# Patient Record
Sex: Male | Born: 1960
Health system: Southern US, Community
[De-identification: ages and names within clinical notes are randomized; demographics above are authoritative.]

## PROBLEM LIST (undated history)

## (undated) DIAGNOSIS — E11610 Type 2 diabetes mellitus with diabetic neuropathic arthropathy: Secondary | ICD-10-CM

## (undated) DIAGNOSIS — E119 Type 2 diabetes mellitus without complications: Secondary | ICD-10-CM

## (undated) DIAGNOSIS — K219 Gastro-esophageal reflux disease without esophagitis: Secondary | ICD-10-CM

## (undated) DIAGNOSIS — E13319 Other specified diabetes mellitus with unspecified diabetic retinopathy without macular edema: Secondary | ICD-10-CM

## (undated) DIAGNOSIS — J189 Pneumonia, unspecified organism: Secondary | ICD-10-CM

## (undated) DIAGNOSIS — R600 Localized edema: Secondary | ICD-10-CM

## (undated) DIAGNOSIS — N486 Induration penis plastica: Secondary | ICD-10-CM

## (undated) DIAGNOSIS — E559 Vitamin D deficiency, unspecified: Secondary | ICD-10-CM

## (undated) DIAGNOSIS — N400 Enlarged prostate without lower urinary tract symptoms: Secondary | ICD-10-CM

## (undated) DIAGNOSIS — Z974 Presence of external hearing-aid: Secondary | ICD-10-CM

## (undated) DIAGNOSIS — D649 Anemia, unspecified: Secondary | ICD-10-CM

## (undated) DIAGNOSIS — K900 Celiac disease: Secondary | ICD-10-CM

## (undated) DIAGNOSIS — Z860101 Personal history of adenomatous and serrated colon polyps: Secondary | ICD-10-CM

## (undated) DIAGNOSIS — N35010 Post-traumatic urethral stricture, male, meatal: Secondary | ICD-10-CM

## (undated) DIAGNOSIS — E785 Hyperlipidemia, unspecified: Secondary | ICD-10-CM

## (undated) DIAGNOSIS — G629 Polyneuropathy, unspecified: Secondary | ICD-10-CM

## (undated) DIAGNOSIS — K5909 Other constipation: Secondary | ICD-10-CM

## (undated) DIAGNOSIS — D631 Anemia in chronic kidney disease: Secondary | ICD-10-CM

## (undated) DIAGNOSIS — Z9359 Other cystostomy status: Secondary | ICD-10-CM

## (undated) DIAGNOSIS — N183 Chronic kidney disease, stage 3 unspecified: Secondary | ICD-10-CM

## (undated) DIAGNOSIS — E782 Mixed hyperlipidemia: Secondary | ICD-10-CM

## (undated) DIAGNOSIS — N189 Chronic kidney disease, unspecified: Secondary | ICD-10-CM

## (undated) DIAGNOSIS — I10 Essential (primary) hypertension: Secondary | ICD-10-CM

## (undated) DIAGNOSIS — Z86010 Personal history of colonic polyps: Secondary | ICD-10-CM

## (undated) DIAGNOSIS — N401 Enlarged prostate with lower urinary tract symptoms: Secondary | ICD-10-CM

## (undated) DIAGNOSIS — M47814 Spondylosis without myelopathy or radiculopathy, thoracic region: Secondary | ICD-10-CM

## (undated) DIAGNOSIS — G4730 Sleep apnea, unspecified: Secondary | ICD-10-CM

## (undated) DIAGNOSIS — K5730 Diverticulosis of large intestine without perforation or abscess without bleeding: Secondary | ICD-10-CM

## (undated) HISTORY — DX: Benign prostatic hyperplasia without lower urinary tract symptoms: N40.0

## (undated) HISTORY — DX: Anemia, unspecified: D64.9

## (undated) HISTORY — DX: Gastro-esophageal reflux disease without esophagitis: K21.9

## (undated) HISTORY — DX: Hyperlipidemia, unspecified: E78.5

## (undated) HISTORY — DX: Spondylosis without myelopathy or radiculopathy, thoracic region: M47.814

## (undated) HISTORY — DX: Type 2 diabetes mellitus without complications: E11.9

## (undated) HISTORY — DX: Essential (primary) hypertension: I10

---

## 2001-11-15 HISTORY — PX: COLONOSCOPY: SHX174

## 2001-12-06 ENCOUNTER — Ambulatory Visit (HOSPITAL_COMMUNITY): Admission: RE | Admit: 2001-12-06 | Discharge: 2001-12-06 | Payer: Self-pay | Admitting: Internal Medicine

## 2001-12-13 ENCOUNTER — Encounter (INDEPENDENT_AMBULATORY_CARE_PROVIDER_SITE_OTHER): Payer: Self-pay | Admitting: Internal Medicine

## 2001-12-13 ENCOUNTER — Ambulatory Visit (HOSPITAL_COMMUNITY): Admission: RE | Admit: 2001-12-13 | Discharge: 2001-12-13 | Payer: Self-pay | Admitting: Internal Medicine

## 2001-12-15 ENCOUNTER — Ambulatory Visit (HOSPITAL_COMMUNITY): Admission: RE | Admit: 2001-12-15 | Discharge: 2001-12-15 | Payer: Self-pay | Admitting: Internal Medicine

## 2003-09-20 ENCOUNTER — Ambulatory Visit: Admission: RE | Admit: 2003-09-20 | Discharge: 2003-09-20 | Payer: Self-pay | Admitting: Family Medicine

## 2004-02-28 ENCOUNTER — Ambulatory Visit (HOSPITAL_COMMUNITY): Admission: RE | Admit: 2004-02-28 | Discharge: 2004-02-28 | Payer: Self-pay | Admitting: Family Medicine

## 2004-03-05 ENCOUNTER — Encounter (HOSPITAL_COMMUNITY): Admission: RE | Admit: 2004-03-05 | Discharge: 2004-04-04 | Payer: Self-pay | Admitting: Preventative Medicine

## 2013-11-15 DIAGNOSIS — K900 Celiac disease: Secondary | ICD-10-CM

## 2013-11-15 HISTORY — DX: Celiac disease: K90.0

## 2014-03-18 ENCOUNTER — Telehealth: Payer: Self-pay

## 2014-03-18 NOTE — Telephone Encounter (Signed)
PT was referred by Dr. Hilma Favors for screening colonoscopy. His hemoglobin is low at 11.7 and he needs an OV prior to scheduling colonoscopy.  I called and LMOM for a return call.

## 2014-03-19 ENCOUNTER — Telehealth: Payer: Self-pay

## 2014-03-19 NOTE — Telephone Encounter (Signed)
Patient was returning call to DS. DS asked for me to make patient OV due to his hemoglobin being 11.7 and needs OV prior to scheduling TCS. Pt is aware of OV on 6/5 at 0930 with [facility]SL.

## 2014-03-19 NOTE — Telephone Encounter (Signed)
Pt's referral info is in appt box.

## 2014-04-03 NOTE — Telephone Encounter (Signed)
Pt has appt with Neil Crouch, PA on 04/19/2014 at 9:30 Am for low hemoglobin and schedule colonoscopy.

## 2014-04-18 ENCOUNTER — Ambulatory Visit: Payer: Self-pay | Admitting: Gastroenterology

## 2014-04-19 ENCOUNTER — Ambulatory Visit (INDEPENDENT_AMBULATORY_CARE_PROVIDER_SITE_OTHER): Payer: 59 | Admitting: Gastroenterology

## 2014-04-19 ENCOUNTER — Encounter: Payer: Self-pay | Admitting: Gastroenterology

## 2014-04-19 ENCOUNTER — Encounter (INDEPENDENT_AMBULATORY_CARE_PROVIDER_SITE_OTHER): Payer: Self-pay

## 2014-04-19 VITALS — BP 111/70 | HR 52 | Temp 97.2°F | Ht 68.0 in | Wt 225.6 lb

## 2014-04-19 DIAGNOSIS — D509 Iron deficiency anemia, unspecified: Secondary | ICD-10-CM

## 2014-04-19 DIAGNOSIS — K219 Gastro-esophageal reflux disease without esophagitis: Secondary | ICD-10-CM

## 2014-04-19 MED ORDER — PEG 3350-KCL-NA BICARB-NACL 420 G PO SOLR: 4000.0000 mL | ORAL | Status: DC

## 2014-04-19 NOTE — Progress Notes (Signed)
Primary Care Physician:  Purvis Kilts, MD  Primary Gastroenterologist:  Barney Drain, MD   Chief Complaint  Patient presents with  . PT REFERRED FOR COLONOSCOPY/ HEMOGLOBIN LOW    HPI:  Brian Nielsen is a 53 y.o. male here for further evaluation of IDA at the request of Dr. Hilma Favors. GERD for more than 10 years. Chronically on medication. No prior EGD. No dysphagia, vomiting, abdominal pain, constipation, diarrhea, brbpr. No melena. DM for 10 years. Rare ibuprofen. 3 hemoccults negative per patient. Seen by Dr. Laural Golden for rectal bleeding in 2003. Per patient had couple of polyps. We have requested copy of report/path.  Current Outpatient Prescriptions  Medication Sig Dispense Refill  . amLODipine (NORVASC) 10 MG tablet Take 10 mg by mouth daily.      Marland Kitchen aspirin 325 MG EC tablet Take 325 mg by mouth daily.      . ferrous sulfate 325 (65 FE) MG tablet Take 325 mg by mouth 2 (two) times daily with a meal.      . glipiZIDE (GLUCOTROL) 10 MG tablet Take 10 mg by mouth 2 (two) times daily before a meal. PT takes it at night      . hydrochlorothiazide (HYDRODIURIL) 25 MG tablet Take 25 mg by mouth daily.      . insulin glargine (LANTUS) 100 UNIT/ML injection Inject into the skin at bedtime. 116 units at night      . lisinopril (PRINIVIL,ZESTRIL) 20 MG tablet Take 20 mg by mouth daily.      . metFORMIN (GLUCOPHAGE) 1000 MG tablet Take 1,000 mg by mouth 2 (two) times daily with a meal.      . metoprolol succinate (TOPROL-XL) 50 MG 24 hr tablet Take 50 mg by mouth daily. Take with or immediately following a meal.      . Omega-3 Fatty Acids (FISH OIL) 1000 MG CPDR Take by mouth. Pt takes 4 tablets daily      . omeprazole (PRILOSEC) 40 MG capsule Take 40 mg by mouth daily.      . rosuvastatin (CRESTOR) 20 MG tablet Take 20 mg by mouth daily.      . tamsulosin (FLOMAX) 0.4 MG CAPS capsule Take 0.4 mg by mouth.      . polyethylene glycol-electrolytes (TRILYTE) 420 G solution Take 4,000 mLs by mouth  as directed.  4000 mL  0   No current facility-administered medications for this visit.    Allergies as of 04/19/2014  . (No Known Allergies)    Past Medical History  Diagnosis Date  . HTN (hypertension)   . DM (diabetes mellitus)   . BPH (benign prostatic hyperplasia)   . Hyperlipidemia     Past Surgical History  Procedure Laterality Date  . Colonoscopy  2003    Dr. Laural Golden    Family History  Problem Relation Age of Onset  . Colon cancer Neg Hx   . Heart disease Father   . Aneurysm Mother     brain    History   Social History  . Marital Status: Single    Spouse Name: N/A    Number of Children: 1  . Years of Education: N/A   Occupational History  . [employer]    Social History Main Topics  . Smoking status: Former Research scientist (life sciences)  . Smokeless tobacco: Not on file     Comment: Quit x 30 years  . Alcohol Use: Yes     Comment: occasionally on weekends  . Drug Use: No  . Sexual  Activity: Not on file   Other Topics Concern  . Not on file   Social History Narrative  . No narrative on file      ROS:  General: Negative for anorexia, weight loss, fever, chills, fatigue, weakness. Eyes: Negative for vision changes.  ENT: Negative for hoarseness, difficulty swallowing , nasal congestion. CV: Negative for chest pain, angina, palpitations, dyspnea on exertion, peripheral edema.  Respiratory: Negative for dyspnea at rest, dyspnea on exertion, cough, sputum, wheezing.  GI: See history of present illness. GU:  Negative for dysuria, hematuria, urinary incontinence, urinary frequency, nocturnal urination.  MS: Negative for joint pain, low back pain.  Derm: Negative for rash or itching.  Neuro: Negative for weakness, abnormal sensation, seizure, frequent headaches, memory loss, confusion.  Psych: Negative for anxiety, depression, suicidal ideation, hallucinations.  Endo: Negative for unusual weight change.  Heme: Negative for bruising or bleeding. Allergy: Negative for rash  or hives.    Physical Examination:  BP 111/70  Pulse 52  Temp(Src) 97.2 F (36.2 C) (Oral)  Ht 5' 8"  (1.727 m)  Wt 225 lb 9.6 oz (102.331 kg)  BMI 34.31 kg/m2   General: Well-nourished, well-developed in no acute distress.  Head: Normocephalic, atraumatic.   Eyes: Conjunctiva pink, no icterus. Mouth: Oropharyngeal mucosa moist and pink , no lesions erythema or exudate. Neck: Supple without thyromegaly, masses, or lymphadenopathy.  Lungs: Clear to auscultation bilaterally.  Heart: Regular rate and rhythm, no murmurs rubs or gallops.  Abdomen: Bowel sounds are normal, nontender, nondistended, no hepatosplenomegaly or masses, no abdominal bruits or    hernia , no rebound or guarding.   Rectal: not performed Extremities: No lower extremity edema. No clubbing or deformities.  Neuro: Alert and oriented x 4 , grossly normal neurologically.  Skin: Warm and dry, no rash or jaundice.   Psych: Alert and cooperative, normal mood and affect.  Labs: Labs from 02/25/2014 Sodium 137, glucose 285, BUN 24, creatinine 1.07, total bilirubin 0.5, alkaline phosphatase 65, AST 21, ALT 30, albumin 4.2, calcium 9.3, white blood cell count 5200, hemoglobin 11.7, hematocrit 36, MCV 82, platelets 280,000, iron 41, TIBC 440, her saturations 9%, B12 473, folate greater than 20, ferritin 14  Imaging Studies: No results found.

## 2014-04-19 NOTE — Patient Instructions (Signed)
1. Colonoscopy and upper endoscopy in the near future. Please see separate instructions.

## 2014-04-19 NOTE — Assessment & Plan Note (Signed)
IDA. Patient reports heme negative take home test X 3. Last TCS 2003, ?polyps. Report has been requested. Void of any lower GI symptoms. He has chronic well-controlled GERD. No prior EGD. Recommend EGD/colonoscopy for further evaluation of IDA, rule out Barrett's.  I have discussed the risks, alternatives, benefits with regards to but not limited to the risk of reaction to medication, bleeding, infection, perforation and the patient is agreeable to proceed. Written consent to be obtained.

## 2014-04-19 NOTE — Progress Notes (Signed)
Please request copy of TCS and path, done by Dr. Laural Golden in 2003.

## 2014-04-22 ENCOUNTER — Encounter (HOSPITAL_COMMUNITY): Payer: Self-pay | Admitting: Pharmacy Technician

## 2014-04-22 NOTE — Progress Notes (Signed)
Requested Records from [place] at [facility] medical Records

## 2014-05-03 ENCOUNTER — Encounter (HOSPITAL_COMMUNITY): Payer: Self-pay | Admitting: *Deleted

## 2014-05-03 ENCOUNTER — Ambulatory Visit (HOSPITAL_COMMUNITY)
Admission: RE | Admit: 2014-05-03 | Discharge: 2014-05-03 | Disposition: A | Discharge status: Home / Self Care | Payer: 59 | Source: Ambulatory Visit | Attending: Gastroenterology | Admitting: Gastroenterology

## 2014-05-03 ENCOUNTER — Encounter (HOSPITAL_COMMUNITY)
Admission: RE | Disposition: A | Discharge status: Home / Self Care | Payer: Self-pay | Source: Ambulatory Visit | Attending: Gastroenterology

## 2014-05-03 DIAGNOSIS — Z87891 Personal history of nicotine dependence: Secondary | ICD-10-CM | POA: Insufficient documentation

## 2014-05-03 DIAGNOSIS — E119 Type 2 diabetes mellitus without complications: Secondary | ICD-10-CM | POA: Insufficient documentation

## 2014-05-03 DIAGNOSIS — K5730 Diverticulosis of large intestine without perforation or abscess without bleeding: Secondary | ICD-10-CM | POA: Insufficient documentation

## 2014-05-03 DIAGNOSIS — K2960 Other gastritis without bleeding: Secondary | ICD-10-CM | POA: Insufficient documentation

## 2014-05-03 DIAGNOSIS — K900 Celiac disease: Secondary | ICD-10-CM | POA: Insufficient documentation

## 2014-05-03 DIAGNOSIS — D509 Iron deficiency anemia, unspecified: Secondary | ICD-10-CM

## 2014-05-03 DIAGNOSIS — D649 Anemia, unspecified: Secondary | ICD-10-CM | POA: Insufficient documentation

## 2014-05-03 DIAGNOSIS — Z7982 Long term (current) use of aspirin: Secondary | ICD-10-CM | POA: Insufficient documentation

## 2014-05-03 DIAGNOSIS — Z794 Long term (current) use of insulin: Secondary | ICD-10-CM | POA: Insufficient documentation

## 2014-05-03 DIAGNOSIS — K648 Other hemorrhoids: Secondary | ICD-10-CM | POA: Insufficient documentation

## 2014-05-03 DIAGNOSIS — I10 Essential (primary) hypertension: Secondary | ICD-10-CM | POA: Insufficient documentation

## 2014-05-03 DIAGNOSIS — E785 Hyperlipidemia, unspecified: Secondary | ICD-10-CM | POA: Insufficient documentation

## 2014-05-03 DIAGNOSIS — Z79899 Other long term (current) drug therapy: Secondary | ICD-10-CM | POA: Insufficient documentation

## 2014-05-03 DIAGNOSIS — K219 Gastro-esophageal reflux disease without esophagitis: Secondary | ICD-10-CM

## 2014-05-03 HISTORY — PX: COLONOSCOPY: SHX5424

## 2014-05-03 HISTORY — PX: ESOPHAGOGASTRODUODENOSCOPY: SHX5428

## 2014-05-03 LAB — GLUCOSE, CAPILLARY: Glucose-Capillary: 193 mg/dL — ABNORMAL HIGH (ref 70–99)

## 2014-05-03 SURGERY — COLONOSCOPY
Anesthesia: Moderate Sedation

## 2014-05-03 MED ORDER — LIDOCAINE VISCOUS 2 % MT SOLN
OROMUCOSAL | Status: AC
  Filled 2014-05-03: qty 15

## 2014-05-03 MED ORDER — PROMETHAZINE HCL 25 MG/ML IJ SOLN
INTRAMUSCULAR | Status: DC | PRN
  Administered 2014-05-03: 25 mg via INTRAVENOUS

## 2014-05-03 MED ORDER — MEPERIDINE HCL 100 MG/ML IJ SOLN
INTRAMUSCULAR | Status: DC | PRN
  Administered 2014-05-03: 50 mg via INTRAVENOUS
  Administered 2014-05-03 (×3): 25 mg via INTRAVENOUS

## 2014-05-03 MED ORDER — PROMETHAZINE HCL 25 MG/ML IJ SOLN
INTRAMUSCULAR | Status: AC
  Filled 2014-05-03: qty 1

## 2014-05-03 MED ORDER — MEPERIDINE HCL 100 MG/ML IJ SOLN
INTRAMUSCULAR | Status: AC
  Filled 2014-05-03: qty 2

## 2014-05-03 MED ORDER — MIDAZOLAM HCL 5 MG/5ML IJ SOLN
INTRAMUSCULAR | Status: AC
  Filled 2014-05-03: qty 10

## 2014-05-03 MED ORDER — SODIUM CHLORIDE 0.9 % IV SOLN
INTRAVENOUS | Status: DC
  Administered 2014-05-03: 11:00:00 via INTRAVENOUS

## 2014-05-03 MED ORDER — MINERAL OIL PO OIL
TOPICAL_OIL | ORAL | Status: AC
  Filled 2014-05-03: qty 30

## 2014-05-03 MED ORDER — MIDAZOLAM HCL 5 MG/5ML IJ SOLN
INTRAMUSCULAR | Status: DC | PRN
  Administered 2014-05-03 (×4): 2 mg via INTRAVENOUS
  Administered 2014-05-03: 1 mg via INTRAVENOUS

## 2014-05-03 MED ORDER — STERILE WATER FOR IRRIGATION IR SOLN
Status: DC | PRN
  Administered 2014-05-03: 13:00:00

## 2014-05-03 NOTE — Discharge Instructions (Signed)
NO OBVIOUS SOURCE FOR YOUR LOW BLOOD COUNT WAS IDENTIFIED. You have internal hemorrhoids & diverticulosis IN YOUR LEFT COLON. You have gastritis. I biopsied your stomach & duodenum.   CONTINUE OMEPRAZOLE 30 MINS BEFORE BREAKFAST. AVOID ITEMS THAT TRIGGER GASTRITIS. SEE INFO BELOW. FOLLOW A HIGH FIBER/LOW FAT DIET. AVOID ITEMS THAT CAUSE BLOATING. SEE INFO BELOW.  YOUR BIOPSY RESULTS WILL BE AVAILABLE IN 7 DAYS. FOLLOW UP IN 3 MOS.  Next colonoscopy in 10 years.  ENDOSCOPY Care After Read the instructions outlined below and refer to this sheet in the next week. These discharge instructions provide you with general information on caring for yourself after you leave the hospital. While your treatment has been planned according to the most current medical practices available, unavoidable complications occasionally occur. If you have any problems or questions after discharge, call DR. Carinne Brandenburger, [phone].  ACTIVITY  You may resume your regular activity, but move at a slower pace for the next 24 hours.   Take frequent rest periods for the next 24 hours.   Walking will help get rid of the air and reduce the bloated feeling in your belly (abdomen).   No driving for 24 hours (because of the medicine (anesthesia) used during the test).   You may shower.   Do not sign any important legal documents or operate any machinery for 24 hours (because of the anesthesia used during the test).    NUTRITION  Drink plenty of fluids.   You may resume your normal diet as instructed by your doctor.   Begin with a light meal and progress to your normal diet. Heavy or fried foods are harder to digest and may make you feel sick to your stomach (nauseated).   Avoid alcoholic beverages for 24 hours or as instructed.    MEDICATIONS  You may resume your normal medications.   WHAT YOU CAN EXPECT TODAY  Some feelings of bloating in the abdomen.   Passage of more gas than usual.   Spotting of blood  in your stool or on the toilet paper  .  IF YOU HAD POLYPS REMOVED DURING THE ENDOSCOPY:  Eat a soft diet IF YOU HAVE NAUSEA, BLOATING, ABDOMINAL PAIN, OR VOMITING.    FINDING OUT THE RESULTS OF YOUR TEST Not all test results are available during your visit. DR. Oneida Alar WILL CALL YOU WITHIN 7 DAYS OF YOUR PROCEDUE WITH YOUR RESULTS. Do not assume everything is normal if you have not heard from DR. Merton Wadlow IN ONE WEEK, CALL HER OFFICE AT [phone].  SEEK IMMEDIATE MEDICAL ATTENTION AND CALL THE OFFICE: [phone] IF:  You have more than a spotting of blood in your stool.   Your belly is swollen (abdominal distention).   You are nauseated or vomiting.   You have a temperature over 101F.   You have abdominal pain or discomfort that is severe or gets worse throughout the day.   Gastritis  Gastritis is an inflammation (the body's way of reacting to injury and/or infection) of the stomach. It is often caused by viral or bacterial (germ) infections. It can also be caused BY ASPIRIN, BC/GOODY POWDER'S, (IBUPROFEN) MOTRIN, OR ALEVE (NAPROXEN), chemicals (including alcohol), SPICY FOODS, and medications. This illness may be associated with generalized malaise (feeling tired, not well), UPPER ABDOMINAL STOMACH cramps, and fever. One common bacterial cause of gastritis is an organism known as H. Pylori. This can be treated with antibiotics.    High-Fiber Diet A high-fiber diet changes your normal diet to include more whole  grains, legumes, fruits, and vegetables. Changes in the diet involve replacing refined carbohydrates with unrefined foods. The calorie level of the diet is essentially unchanged. The Dietary Reference Intake (recommended amount) for adult males is 38 grams per day. For adult females, it is 25 grams per day. Pregnant and lactating women should consume 28 grams of fiber per day. Fiber is the intact part of a plant that is not broken down during digestion. Functional fiber is  fiber that has been isolated from the plant to provide a beneficial effect in the body. PURPOSE  Increase stool bulk.   Ease and regulate bowel movements.   Lower cholesterol.  INDICATIONS THAT YOU NEED MORE FIBER  Constipation and hemorrhoids.   Uncomplicated diverticulosis (intestine condition) and irritable bowel syndrome.   Weight management.   As a protective measure against hardening of the arteries (atherosclerosis), diabetes, and cancer.   GUIDELINES FOR INCREASING FIBER IN THE DIET  Start adding fiber to the diet slowly. A gradual increase of about 5 more grams (2 slices of whole-wheat bread, 2 servings of most fruits or vegetables, or 1 bowl of high-fiber cereal) per day is best. Too rapid an increase in fiber may result in constipation, flatulence, and bloating.   Drink enough water and fluids to keep your urine clear or pale yellow. Water, juice, or caffeine-free drinks are recommended. Not drinking enough fluid may cause constipation.   Eat a variety of high-fiber foods rather than one type of fiber.   Try to increase your intake of fiber through using high-fiber foods rather than fiber pills or supplements that contain small amounts of fiber.   The goal is to change the types of food eaten. Do not supplement your present diet with high-fiber foods, but replace foods in your present diet.  INCLUDE A VARIETY OF FIBER SOURCES  Replace refined and processed grains with whole grains, canned fruits with fresh fruits, and incorporate other fiber sources. White rice, white breads, and most bakery goods contain little or no fiber.   Brown whole-grain rice, buckwheat oats, and many fruits and vegetables are all good sources of fiber. These include: broccoli, Brussels sprouts, cabbage, cauliflower, beets, sweet potatoes, white potatoes (skin on), carrots, tomatoes, eggplant, squash, berries, fresh fruits, and dried fruits.   Cereals appear to be the richest source of fiber.  Cereal fiber is found in whole grains and bran. Bran is the fiber-rich outer coat of cereal grain, which is largely removed in refining. In whole-grain cereals, the bran remains. In breakfast cereals, the largest amount of fiber is found in those with "bran" in their names. The fiber content is sometimes indicated on the label.   You may need to include additional fruits and vegetables each day.   In baking, for 1 cup white flour, you may use the following substitutions:   1 cup whole-wheat flour minus 2 tablespoons.   1/2 cup white flour plus 1/2 cup whole-wheat flour.   Low-Fat Diet BREADS, CEREALS, PASTA, RICE, DRIED PEAS, AND BEANS These products are high in carbohydrates and most are low in fat. Therefore, they can be increased in the diet as substitutes for fatty foods. They too, however, contain calories and should not be eaten in excess. Cereals can be eaten for snacks as well as for breakfast.  Include foods that contain fiber (fruits, vegetables, whole grains, and legumes). Research shows that fiber may lower blood cholesterol levels, especially the water-soluble fiber found in fruits, vegetables, oat products, and legumes. FRUITS AND VEGETABLES  It is good to eat fruits and vegetables. Besides being sources of fiber, both are rich in vitamins and some minerals. They help you get the daily allowances of these nutrients. Fruits and vegetables can be used for snacks and desserts. MEATS Limit lean meat, chicken, [place], and fish to no more than 6 ounces per day. Beef, Pork, and Lamb Use lean cuts of beef, pork, and lamb. Lean cuts include:  Extra-lean ground beef.  Arm roast.  Sirloin tip.  Center-cut ham.  Round steak.  Loin chops.  Rump roast.  Tenderloin.  Trim all fat off the outside of meats before cooking. It is not necessary to severely decrease the intake of red meat, but lean choices should be made. Lean meat is rich in protein and contains a highly absorbable form of iron.  Premenopausal women, in particular, should avoid reducing lean red meat because this could increase the risk for low red blood cells (iron-deficiency anemia). The organ meats, such as liver, sweetbreads, kidneys, and brain are very rich in cholesterol. They should be limited. Chicken and [place] These are good sources of protein. The fat of poultry can be reduced by removing the skin and underlying fat layers before cooking. Chicken and [place] can be substituted for lean red meat in the diet. Poultry should not be fried or covered with high-fat sauces. Fish and Shellfish Fish is a good source of protein. Shellfish contain cholesterol, but they usually are low in saturated fatty acids. The preparation of fish is important. Like chicken and [place], they should not be fried or covered with high-fat sauces. EGGS Egg whites contain no fat or cholesterol. They can be eaten often. Try 1 to 2 egg whites instead of whole eggs in recipes or use egg substitutes that do not contain yolk. MILK AND DAIRY PRODUCTS Use skim or 1% milk instead of 2% or whole milk. Decrease whole milk, natural, and processed cheeses. Use nonfat or low-fat (2%) cottage cheese or low-fat cheeses made from vegetable oils. Choose nonfat or low-fat (1 to 2%) yogurt. Experiment with evaporated skim milk in recipes that call for heavy cream. Substitute low-fat yogurt or low-fat cottage cheese for sour cream in dips and salad dressings. Have at least 2 servings of low-fat dairy products, such as 2 glasses of skim (or 1%) milk each day to help get your daily calcium intake.  FATS AND OILS Reduce the total intake of fats, especially saturated fat. Butterfat, lard, and beef fats are high in saturated fat and cholesterol. These should be avoided as much as possible. Vegetable fats do not contain cholesterol, but certain vegetable fats, such as coconut oil, palm oil, and palm kernel oil are very high in saturated fats. These should be limited. These  fats are often used in bakery goods, processed foods, popcorn, oils, and nondairy creamers. Vegetable shortenings and some peanut butters contain hydrogenated oils, which are also saturated fats. Read the labels on these foods and check for saturated vegetable oils. Unsaturated vegetable oils and fats do not raise blood cholesterol. However, they should be limited because they are fats and are high in calories. Total fat should still be limited to 30% of your daily caloric intake. Desirable liquid vegetable oils are corn oil, cottonseed oil, olive oil, canola oil, safflower oil, soybean oil, and sunflower oil. Peanut oil is not as good, but small amounts are acceptable. Buy a heart-healthy tub margarine that has no partially hydrogenated oils in the ingredients. Mayonnaise and salad dressings often are made from  unsaturated fats, but they should also be limited because of their high calorie and fat content. Seeds, nuts, peanut butter, olives, and avocados are high in fat, but the fat is mainly the unsaturated type. These foods should be limited mainly to avoid excess calories and fat. OTHER EATING TIPS Snacks  Most sweets should be limited as snacks. They tend to be rich in calories and fats, and their caloric content outweighs their nutritional value. Some good choices in snacks are graham crackers, melba toast, soda crackers, bagels (no egg), English muffins, fruits, and vegetables. These snacks are preferable to snack crackers, [place] fries, and chips. Popcorn should be air-popped or cooked in small amounts of liquid vegetable oil. Desserts Eat fruit, low-fat yogurt, and fruit ices. AVOID pastries, cake, and cookies. Sherbet, angel food cake, gelatin dessert, frozen low-fat yogurt, or other frozen products that do not contain saturated fat (pure fruit juice bars, frozen ice pops) are also acceptable.  COOKING METHODS Choose those methods that use little or no fat. They include: Poaching.  Braising.    Steaming.  Grilling.  Baking.  Stir-frying.  Broiling.  Microwaving.  Foods can be cooked in a nonstick pan without added fat, or use a nonfat cooking spray in regular cookware. Limit fried foods and avoid frying in saturated fat. Add moisture to lean meats by using water, broth, cooking wines, and other nonfat or low-fat sauces along with the cooking methods mentioned above. Soups and stews should be chilled after cooking. The fat that forms on top after a few hours in the refrigerator should be skimmed off. When preparing meals, avoid using excess salt. Salt can contribute to raising blood pressure in some people. EATING AWAY FROM HOME Order entres, potatoes, and vegetables without sauces or butter. When meat exceeds the size of a deck of cards (3 to 4 ounces), the rest can be taken home for another meal. Choose vegetable or fruit salads and ask for low-calorie salad dressings to be served on the side. Use dressings sparingly. Limit high-fat toppings, such as bacon, crumbled eggs, cheese, sunflower seeds, and olives. Ask for heart-healthy tub margarine instead of butter.   Diverticulosis Diverticulosis is a common condition that develops when small pouches (diverticula) form in the wall of the colon. The risk of diverticulosis increases with age. It happens more often in people who eat a low-fiber diet. Most individuals with diverticulosis have no symptoms. Those individuals with symptoms usually experience belly (abdominal) pain, constipation, or loose stools (diarrhea).  HOME CARE INSTRUCTIONS  Increase the amount of fiber in your diet as directed by your caregiver or dietician. This may reduce symptoms of diverticulosis.   Drink at least 6 to 8 glasses of water each day to prevent constipation.   Try not to strain when you have a bowel movement.   Avoiding nuts and seeds to prevent complications is still an uncertain benefit.       FOODS HAVING HIGH FIBER CONTENT  INCLUDE:  Fruits. Apple, peach, pear, tangerine, raisins, prunes.   Vegetables. Brussels sprouts, asparagus, broccoli, cabbage, carrot, cauliflower, romaine lettuce, spinach, summer squash, tomato, winter squash, zucchini.   Starchy Vegetables. Baked beans, kidney beans, lima beans, split peas, lentils, potatoes (with skin).   Grains. Whole wheat bread, brown rice, bran flake cereal, plain oatmeal, white rice, shredded wheat, bran muffins.    SEEK IMMEDIATE MEDICAL CARE IF:  You develop increasing pain or severe bloating.   You have an oral temperature above 101F.   You develop vomiting or  bowel movements that are bloody or black.    Hemorrhoids Hemorrhoids are dilated (enlarged) veins around the rectum. Sometimes clots will form in the veins. This makes them swollen and painful. These are called thrombosed hemorrhoids. Causes of hemorrhoids include:  Constipation.   Straining to have a bowel movement.   HEAVY LIFTING HOME CARE INSTRUCTIONS  Eat a well balanced diet and drink 6 to 8 glasses of water every day to avoid constipation. You may also use a bulk laxative.   Avoid straining to have bowel movements.   Keep anal area dry and clean.   Do not use a donut shaped pillow or sit on the toilet for long periods. This increases blood pooling and pain.   Move your bowels when your body has the urge; this will require less straining and will decrease pain and pressure.

## 2014-05-03 NOTE — Progress Notes (Signed)
REVIEWED.  

## 2014-05-03 NOTE — H&P (Signed)
Primary Care Physician:  Purvis Kilts, MD Primary Gastroenterologist:  Dr. Oneida Alar  Pre-Procedure History & Physical: HPI:  Brian Nielsen is a 53 y.o. male here for Anemia/GERD.  Past Medical History  Diagnosis Date  . HTN (hypertension)   . DM (diabetes mellitus)   . BPH (benign prostatic hyperplasia)   . Hyperlipidemia     Past Surgical History  Procedure Laterality Date  . Colonoscopy  2003    Dr. Laural Golden    Prior to Admission medications   Medication Sig Start Date End Date Taking? Authorizing Chakia Counts  amLODipine (NORVASC) 10 MG tablet Take 10 mg by mouth daily.   Yes Historical Pooja Camuso, MD  aspirin 325 MG EC tablet Take 325 mg by mouth daily.   Yes Historical Jimie Kuwahara, MD  ferrous sulfate 325 (65 FE) MG tablet Take 325 mg by mouth 2 (two) times daily with a meal.   Yes Historical Courney Garrod, MD  glipiZIDE (GLUCOTROL) 10 MG tablet Take 10 mg by mouth 2 (two) times daily before a meal. PT takes it at night   Yes Historical Srija Southard, MD  hydrochlorothiazide (HYDRODIURIL) 25 MG tablet Take 25 mg by mouth daily.   Yes Historical Nathanuel Cabreja, MD  insulin glargine (LANTUS) 100 UNIT/ML injection Inject into the skin at bedtime. 116 units at night   Yes Historical Loralee Weitzman, MD  lisinopril (PRINIVIL,ZESTRIL) 20 MG tablet Take 20 mg by mouth daily.   Yes Historical Dorthy Magnussen, MD  metFORMIN (GLUCOPHAGE) 1000 MG tablet Take 1,000 mg by mouth 2 (two) times daily with a meal.   Yes Historical Neena Beecham, MD  metoprolol succinate (TOPROL-XL) 50 MG 24 hr tablet Take 50 mg by mouth daily. Take with or immediately following a meal.   Yes Historical Rubbie Goostree, MD  Omega-3 Fatty Acids (FISH OIL) 1000 MG CPDR Take by mouth. Pt takes 4 tablets daily   Yes Historical Fabiha Rougeau, MD  omeprazole (PRILOSEC) 40 MG capsule Take 40 mg by mouth daily.   Yes Historical Aidan Moten, MD  rosuvastatin (CRESTOR) 20 MG tablet Take 20 mg by mouth daily.   Yes Historical Kelseigh Diver, MD  tamsulosin (FLOMAX) 0.4 MG CAPS  capsule Take 0.4 mg by mouth.   Yes Historical Dierre Crevier, MD    Allergies as of 04/19/2014  . (No Known Allergies)    Family History  Problem Relation Age of Onset  . Colon cancer Neg Hx   . Heart disease Father   . Aneurysm Mother     brain    History   Social History  . Marital Status: Single    Spouse Name: N/A    Number of Children: 1  . Years of Education: N/A   Occupational History  . [employer]    Social History Main Topics  . Smoking status: Former Research scientist (life sciences)  . Smokeless tobacco: Not on file     Comment: Quit x 30 years  . Alcohol Use: Yes     Comment: occasionally on weekends  . Drug Use: No  . Sexual Activity: Not on file   Other Topics Concern  . Not on file   Social History Narrative  . No narrative on file    Review of Systems: See HPI, otherwise negative ROS   Physical Exam: BP 152/79  Pulse 56  Temp(Src) 98.4 F (36.9 C) (Oral)  Resp 18  Ht 5' 8"  (1.727 m)  Wt 225 lb (102.059 kg)  BMI 34.22 kg/m2  SpO2 96% General:   Alert,  pleasant and cooperative in NAD Head:  Normocephalic and  atraumatic. Neck:  Supple; Lungs:  Clear throughout to auscultation.    Heart:  Regular rate and rhythm. Abdomen:  Soft, nontender and nondistended. Normal bowel sounds, without guarding, and without rebound.   Neurologic:  Alert and  oriented x4;  grossly normal neurologically.  Impression/Plan:   Anemia/GERD  PLAN: EGD/TCS TODAY

## 2014-05-05 NOTE — Op Note (Signed)
[place], [address]   ENDOSCOPY PROCEDURE REPORT  PATIENT: Brian Nielsen, Brian Nielsen.  MR#: [id number] BIRTHDATE: 08-25-1961 , 52  yrs. old GENDER: Male  ENDOSCOPIST: Barney Drain, MD REFERRED FT:DDUK Hilma Favors, M.D.  PROCEDURE DATE: 05/03/2014 PROCEDURE:   EGD w/ biopsy  INDICATIONS:Anemia. MEDICATIONS: Demerol 37.5 mg IV and Versed 79m IV TOPICAL ANESTHETIC:   Viscous Xylocaine  DESCRIPTION OF PROCEDURE:     Physical exam was performed.  Informed consent was obtained from the patient after explaining the benefits, risks, and alternatives to the procedure.  The patient was connected to the monitor and placed in the left lateral position.  Continuous oxygen was provided by nasal cannula and IV medicine administered through an indwelling cannula.  After administration of sedation, the patients esophagus was intubated and the [id number] (([id number]  endoscope was advanced under direct visualization to the second portion of the duodenum.  The scope was removed slowly by carefully examining the color, texture, anatomy, and integrity of the mucosa on the way out.  The patient was recovered in endoscopy and discharged home in satisfactory condition.   ESOPHAGUS: The mucosa of the esophagus appeared normal.   STOMACH: Mild non-erosive gastritis (inflammation) was found in the gastric body.  Multiple biopsies were performed using cold forceps. DUODENUM: The duodenal mucosa showed no abnormalities in the bulb and second portion of the duodenum.  Cold forcep biopsies were taken in the second portion. COMPLICATIONS:   None  ENDOSCOPIC IMPRESSION: 1.   NO OBVIOUS SOURCE FOR ANEMIA IDENTIFIED 2.   MILD Non-erosive gastritis  RECOMMENDATIONS: CONTINUE OMEPRAZOLE 30 MINS BEFORE BREAKFAST. AVOID ITEMS THAT TRIGGER GASTRITIS. FOLLOW A HIGH FIBER/LOW FAT DIET.  AVOID ITEMS THAT CAUSE BLOATING.   BIOPSY RESULTS WILL BE AVAILABLE IN 7 DAYS. FOLLOW UP IN 3  MOS. Next colonoscopy in 10 years. CONSIDER PHENERGAN IN PREOP.   REPEAT EXAM:   _______________________________ eLorrin MaisBarney Drain MD 05/05/2014 9:18 PM

## 2014-05-05 NOTE — Op Note (Addendum)
[facility] [address] [place], [address]   COLONOSCOPY PROCEDURE REPORT  PATIENT: Brian Nielsen, Brian Nielsen.  MR#: [id number] BIRTHDATE: 08/29/1961 , 52  yrs. old GENDER: Male ENDOSCOPIST: Barney Drain, MD REFERRED XB:JYNW Hilma Favors, M.D. PROCEDURE DATE:  05/03/2014 PROCEDURE:   Colonoscopy, diagnostic INDICATIONS:Anemia, non-specific. -APR 2015-Hb 11.7, Cr 1.07, FERRITIN 14 MEDICATIONS: Versed 8 mg IV, Promethazine (Phenergan) 84m IV, and Demerol 125 mg IV  DESCRIPTION OF PROCEDURE:    Physical exam was performed.  Informed consent was obtained from the patient after explaining the benefits, risks, and alternatives to procedure.  The patient was connected to monitor and placed in left lateral position. Continuous oxygen was provided by nasal cannula and IV medicine administered through an indwelling cannula.  After administration of sedation and rectal exam, the patients rectum was intubated and the [id number] (([id number]  colonoscope was advanced under direct visualization to the ileum.  The scope was removed slowly by carefully examining the color, texture, anatomy, and integrity mucosa on the way out.  The patient was recovered in endoscopy and discharged home in satisfactory condition.    COLON FINDINGS: The mucosa appeared normal in the terminal ileum.  , Mild diverticulosis was noted in the sigmoid colon.  , The colon mucosa was otherwise normal.  , and Moderate sized internal hemorrhoids were found.  PREP QUALITY: good.  CECAL W/D TIME: 16 minutes     COMPLICATIONS: None  ENDOSCOPIC IMPRESSION: 1.   Normal mucosa in the terminal ileum 2.   Mild diverticulosis n the sigmoid colon 3.   NO OBVIOUS SOURCE FOR ANEMIA IDENTIFIED 4.   Moderate sized internal hemorrhoids   RECOMMENDATIONS: CONTINUE OMEPRAZOLE 30 MINS BEFORE BREAKFAST. AVOID ITEMS THAT TRIGGER GASTRITIS. FOLLOW A HIGH FIBER/LOW FAT DIET.  AVOID ITEMS THAT CAUSE BLOATING.   BIOPSY RESULTS WILL  BE AVAILABLE IN 7 DAYS. FOLLOW UP IN 3 MOS. Next colonoscopy in 10 years.       _______________________________ eLorrin MaisBarney Drain MD 05/05/2014 9:10 PM

## 2014-05-10 ENCOUNTER — Encounter (HOSPITAL_COMMUNITY): Payer: Self-pay | Admitting: Gastroenterology

## 2014-05-23 ENCOUNTER — Other Ambulatory Visit: Payer: Self-pay

## 2014-05-23 ENCOUNTER — Telehealth: Payer: Self-pay | Admitting: Gastroenterology

## 2014-05-23 DIAGNOSIS — K900 Celiac disease: Secondary | ICD-10-CM

## 2014-05-23 NOTE — Telephone Encounter (Signed)
LMOM to call. Lab order faxed to [facility].

## 2014-05-23 NOTE — Telephone Encounter (Addendum)
PLEASE CALL PT. HIS SMALL BOWEL BIOPSIES SHOW HE HAS CELIAC SPRUE. IT MAY MAKE IT DIFFICULT FOR HIM TO ABSORB IRON. HE NEEDS TO GET A TISSUE TRANSGLUTAMINASE IgA. AFTER HIS BLOOD IS DRAWN, I RECOMMEND A GLUTEN FREE DIET. WE CAN SEND HIM TO A DIETICIAN. (NUTRITION CONSULT). HE WILL FOLLOW THE DIET FOR 3 MOS AND THEN WE WILL RECHECK HIS BLOOD COUNT AND IRON STORES. OPV E30 ANEMIA/CELIAC SPRUE SEP 2015. NEXT TCS IN 10 YEARS.

## 2014-05-24 NOTE — Telephone Encounter (Signed)
Reminder in epic °

## 2014-05-27 ENCOUNTER — Other Ambulatory Visit: Payer: Self-pay | Admitting: Gastroenterology

## 2014-05-27 DIAGNOSIS — Z789 Other specified health status: Secondary | ICD-10-CM

## 2014-05-27 NOTE — Telephone Encounter (Signed)
Pt came by the office for the results. I went over the information. He will go by the lab today for the blood work. Copy of the gluten free diet and copy of this info given to him. OK to refer to dietician.  Chelsey, please forward a copy of this to Dr. Collene Mares for the pt.  FYI for Dr. Oneida Alar: Pt said he is on an iron pill and he is also on a soft gel stool softner.

## 2014-05-27 NOTE — Telephone Encounter (Signed)
Nutrition Referral has been made

## 2014-05-27 NOTE — Telephone Encounter (Signed)
Faxed to Dr. Collene Mares.

## 2014-05-29 LAB — TISSUE TRANSGLUTAMINASE, IGA: Tissue Transglutaminase Ab, IgA: 101 U/mL — ABNORMAL HIGH (ref <20)

## 2014-05-29 NOTE — Telephone Encounter (Addendum)
PLEASE CALL PT. HIS TISSUE TRANSGLUTAMINASE TEST IS ABNORMAL. IT IS ELEVATED AT 101. NORMAL IS LESS THAN 20. FOLLOW A GLUTEN FREE DIET. SEE NUTRITION. OPV E30 ANEMIA/CELIAC SPRUE SEP 2015.

## 2014-05-31 NOTE — Telephone Encounter (Signed)
LMOM to call.

## 2014-05-31 NOTE — Telephone Encounter (Signed)
Returned pt's phone call and left the full message on his VM in which he identified himself.

## 2014-07-11 ENCOUNTER — Encounter: Payer: Self-pay | Admitting: Gastroenterology

## 2014-07-11 ENCOUNTER — Telehealth (HOSPITAL_COMMUNITY): Payer: Self-pay | Admitting: Dietician

## 2014-07-11 NOTE — Telephone Encounter (Signed)
Received referral from [facility] for education for gluten free diet. Provided AND Nutrition Care Manual's "Celiac Disease Nutrition Therapy", "Celiac Disease Label Reading Tips", and "Celiac Disease Healthy Eating Tips".

## 2014-07-24 ENCOUNTER — Encounter: Payer: Self-pay | Admitting: Gastroenterology

## 2016-10-19 ENCOUNTER — Encounter: Payer: Self-pay | Admitting: Family Medicine

## 2016-10-19 ENCOUNTER — Other Ambulatory Visit: Payer: Self-pay | Admitting: Family Medicine

## 2016-10-19 ENCOUNTER — Ambulatory Visit (INDEPENDENT_AMBULATORY_CARE_PROVIDER_SITE_OTHER): Payer: PRIVATE HEALTH INSURANCE | Admitting: Family Medicine

## 2016-10-19 VITALS — BP 160/90 | HR 68 | Temp 98.5°F | Resp 18 | Ht 68.0 in | Wt 177.1 lb

## 2016-10-19 DIAGNOSIS — N183 Chronic kidney disease, stage 3 unspecified: Secondary | ICD-10-CM | POA: Insufficient documentation

## 2016-10-19 DIAGNOSIS — R202 Paresthesia of skin: Secondary | ICD-10-CM

## 2016-10-19 DIAGNOSIS — E785 Hyperlipidemia, unspecified: Secondary | ICD-10-CM | POA: Diagnosis not present

## 2016-10-19 DIAGNOSIS — Z23 Encounter for immunization: Secondary | ICD-10-CM

## 2016-10-19 DIAGNOSIS — I10 Essential (primary) hypertension: Secondary | ICD-10-CM | POA: Insufficient documentation

## 2016-10-19 DIAGNOSIS — E119 Type 2 diabetes mellitus without complications: Secondary | ICD-10-CM

## 2016-10-19 DIAGNOSIS — IMO0001 Reserved for inherently not codable concepts without codable children: Secondary | ICD-10-CM

## 2016-10-19 DIAGNOSIS — N400 Enlarged prostate without lower urinary tract symptoms: Secondary | ICD-10-CM

## 2016-10-19 DIAGNOSIS — Z7689 Persons encountering health services in other specified circumstances: Secondary | ICD-10-CM | POA: Diagnosis not present

## 2016-10-19 DIAGNOSIS — R200 Anesthesia of skin: Secondary | ICD-10-CM

## 2016-10-19 DIAGNOSIS — Z794 Long term (current) use of insulin: Secondary | ICD-10-CM

## 2016-10-19 DIAGNOSIS — E1122 Type 2 diabetes mellitus with diabetic chronic kidney disease: Secondary | ICD-10-CM | POA: Insufficient documentation

## 2016-10-19 MED ORDER — OMEPRAZOLE 40 MG PO CPDR: 40.0000 mg | DELAYED_RELEASE_CAPSULE | Freq: Every day | ORAL | 3 refills | Status: DC

## 2016-10-19 MED ORDER — LANSOPRAZOLE 30 MG PO CPDR: 30.0000 mg | DELAYED_RELEASE_CAPSULE | Freq: Every day | ORAL | 1 refills | Status: DC

## 2016-10-19 MED ORDER — TAMSULOSIN HCL 0.4 MG PO CAPS: 0.4000 mg | ORAL_CAPSULE | Freq: Every day | ORAL | 3 refills | Status: DC

## 2016-10-19 NOTE — Progress Notes (Signed)
Chief Complaint  Patient presents with  . Establish Care   Here new to establish Has not seen a doctor for about 2 years He was under care local medical group and had diagnoses of HTN, HLD, IDDM, obesity He stopped going, stopped the medicines, and lost 100 lbs He feels well on no medicine Only complaints ae tingling and numbness of feet and LUTS from his BPH, frequency and hesitancy.  He had a diagnosis of celiac disease and went to nutrition consult.  Does NOT follow any diet and states "I couldn't live like that".  No reported GI intolerance to foods.  Takes OTC omeprazole 40 mg a day for GERD sx.    No reg exercise.  Shots not up to date  COLO done in 2015  Patient Active Problem List   Diagnosis Date Noted  . HLD (hyperlipidemia) 10/19/2016  . BPH (benign prostatic hyperplasia) 10/19/2016  . Essential hypertension 10/19/2016  . Diabetes mellitus, insulin dependent (IDDM), controlled ([place]) 10/19/2016  . Celiac sprue 05/23/2014  . Anemia, iron deficiency 04/19/2014  . GERD (gastroesophageal reflux disease) 04/19/2014    Outpatient Encounter Prescriptions as of 10/19/2016  Medication Sig  . omeprazole (PRILOSEC) 40 MG capsule Take 1 capsule (40 mg total) by mouth daily.  . tamsulosin (FLOMAX) 0.4 MG CAPS capsule Take 1 capsule (0.4 mg total) by mouth daily after supper.   No facility-administered encounter medications on file as of 10/19/2016.     Past Medical History:  Diagnosis Date  . Anemia   . BPH (benign prostatic hyperplasia)   . DM (diabetes mellitus) ([place])   . GERD (gastroesophageal reflux disease)   . HTN (hypertension)   . Hyperlipidemia     Past Surgical History:  Procedure Laterality Date  . COLONOSCOPY  2003   Dr. Laural Golden: two small polyps, small ext hemorrhoids.path:focal adenomatous changes  . COLONOSCOPY N/A 05/03/2014   Procedure: COLONOSCOPY;  Surgeon: Danie Binder, MD;  Location: [facility];  Service: Endoscopy;  Laterality: N/A;  12:00    . ESOPHAGOGASTRODUODENOSCOPY N/A 05/03/2014   Procedure: ESOPHAGOGASTRODUODENOSCOPY (EGD);  Surgeon: Danie Binder, MD;  Location: [facility];  Service: Endoscopy;  Laterality: N/A;    Social History   Social History  . Marital status: Married    Spouse name: [name]  . Number of children: 1  . Years of education: 73   Occupational History  . electrician Engineer, [employer]   Social History Main Topics  . Smoking status: Former Research scientist (life sciences)  . Smokeless tobacco: Current User    Types: Snuff     Comment: Quit x 30 years  . Alcohol use Yes     Comment: occasionally on weekends  . Drug use: No  . Sexual activity: Yes    Birth control/ protection: Surgical   Other Topics Concern  . Not on file   Social History Narrative   Lives with wife   [place]    Family History  Problem Relation Age of Onset  . Heart disease Father   . Diabetes Father   . Hypertension Father   . Hyperlipidemia Father   . Aneurysm Mother     brain  . Cancer Maternal Grandmother     melanoma  . Heart disease Paternal Grandfather   . Colon cancer Neg Hx     Review of Systems  Constitutional: Negative for chills, fever and weight loss.  HENT: Negative for congestion and hearing loss.   Eyes: Negative for  blurred vision and pain.  Respiratory: Negative for cough and shortness of breath.   Cardiovascular: Negative for chest pain and leg swelling.  Gastrointestinal: Negative for abdominal pain, constipation, diarrhea and heartburn.  Genitourinary: Positive for frequency. Negative for dysuria.  Musculoskeletal: Negative for falls, joint pain and myalgias.  Neurological: Positive for tingling. Negative for dizziness, seizures and headaches.  Psychiatric/Behavioral: Negative for depression. The patient is not nervous/anxious and does not have insomnia.     BP (!) 160/90 (BP Location: Right Arm, Patient Position: Sitting, Cuff Size: Normal)   Pulse 68   Temp 98.5 F  (36.9 C) (Oral)   Resp 18   Ht 5' 8"  (1.727 m)   Wt 177 lb 1.9 oz (80.3 kg)   SpO2 98%   BMI 26.93 kg/m   Physical Exam  Constitutional: He is oriented to person, place, and time. He appears well-developed and well-nourished.  HENT:  Head: Normocephalic and atraumatic.  Right Ear: External ear normal.  Left Ear: External ear normal.  Mouth/Throat: Oropharynx is clear and moist.  Poor dentition  Eyes: Conjunctivae are normal. Pupils are equal, round, and reactive to light.  Neck: Normal range of motion. Neck supple. No thyromegaly present.  Cardiovascular: Normal rate, regular rhythm, normal heart sounds and intact distal pulses.   Pulmonary/Chest: Effort normal and breath sounds normal. No respiratory distress.  Abdominal: Soft. Bowel sounds are normal.  Musculoskeletal: Normal range of motion. He exhibits no edema.  Lymphadenopathy:    He has no cervical adenopathy.  Neurological: He is alert and oriented to person, place, and time. He displays abnormal reflex.  Gait normal. Cannot elicit reflexes.  Sensory exam grossly normal, normal fiber testing  Skin: Skin is warm and dry.  Psychiatric: He has a normal mood and affect. His behavior is normal. Thought content normal.  Nursing note and vitals reviewed.  ASSESSMENT/PLAN:  1. Hyperlipidemia, unspecified hyperlipidemia type  2. Benign prostatic hyperplasia without lower urinary tract symptoms   3. Essential hypertension   4. Diabetes mellitus, insulin dependent (IDDM), controlled ([place])   5. Need for prophylactic vaccination and inoculation against influenza   6. Numbness and tingling of both feet  - Urinalysis, Routine w reflex microscopic (not at [facility]) - Microalbumin / creatinine urine ratio - VITAMIN D 25 Hydroxy (Vit-D Deficiency, Fractures) - Hemoglobin A1c - Vitamin B12 - COMPLETE METABOLIC PANEL WITH GFR - Hepatitis C antibody - Lipid panel - TSH  7. Need for Tdap vaccination  - Tdap vaccine greater  than or equal to 7yo IM  8. Encounter to establish care with new doctor    Patient Instructions  Need lab testing  Go back on the tamulosin for the prostate enlargement  Continue the omeprazole for heartburn  See me in a month   Raylene Everts, MD

## 2016-10-19 NOTE — Patient Instructions (Signed)
Need lab testing  Go back on the tamulosin for the prostate enlargement  Continue the omeprazole for heartburn  See me in a month

## 2016-10-20 ENCOUNTER — Other Ambulatory Visit: Payer: Self-pay | Admitting: Family Medicine

## 2016-10-20 ENCOUNTER — Telehealth: Payer: Self-pay | Admitting: Family Medicine

## 2016-10-20 ENCOUNTER — Encounter: Payer: Self-pay | Admitting: Family Medicine

## 2016-10-20 LAB — HEPATITIS C ANTIBODY: HCV AB: REACTIVE — AB

## 2016-10-20 LAB — COMPLETE METABOLIC PANEL WITH GFR
ALT: 14 U/L (ref 9–46)
AST: 12 U/L (ref 10–35)
Albumin: 3.8 g/dL (ref 3.6–5.1)
Alkaline Phosphatase: 95 U/L (ref 40–115)
BILIRUBIN TOTAL: 0.6 mg/dL (ref 0.2–1.2)
BUN: 24 mg/dL (ref 7–25)
CALCIUM: 9.5 mg/dL (ref 8.6–10.3)
CO2: 22 mmol/L (ref 20–31)
CREATININE: 0.96 mg/dL (ref 0.70–1.33)
Chloride: 104 mmol/L (ref 98–110)
GFR, Est African American: >89 mL/min (ref >=60)
GFR, Est Non African American: 89 mL/min (ref >=60)
Glucose, Bld: 367 mg/dL — ABNORMAL HIGH (ref 65–99)
POTASSIUM: 4.6 mmol/L (ref 3.5–5.3)
Sodium: 135 mmol/L (ref 135–146)
Total Protein: 6.4 g/dL (ref 6.1–8.1)

## 2016-10-20 LAB — URINALYSIS, MICROSCOPIC ONLY
BACTERIA UA: NONE SEEN [HPF]
CASTS: NONE SEEN [LPF]
Crystals: NONE SEEN [HPF]
RBC / HPF: NONE SEEN RBC/HPF (ref <=2)
SQUAMOUS EPITHELIAL / LPF: NONE SEEN [HPF] (ref <=5)
WBC, UA: NONE SEEN WBC/HPF (ref <=5)
YEAST: NONE SEEN [HPF]

## 2016-10-20 LAB — URINALYSIS, ROUTINE W REFLEX MICROSCOPIC
Bilirubin Urine: NEGATIVE
Hgb urine dipstick: NEGATIVE
KETONES UR: NEGATIVE
Leukocytes, UA: NEGATIVE
NITRITE: NEGATIVE
SPECIFIC GRAVITY, URINE: 1.038 — AB (ref 1.001–1.035)
pH: 5.5 (ref 5.0–8.0)

## 2016-10-20 LAB — LIPID PANEL
Cholesterol: 210 mg/dL — ABNORMAL HIGH (ref <200)
HDL: 38 mg/dL — ABNORMAL LOW (ref >40)
Total CHOL/HDL Ratio: 5.5 Ratio — ABNORMAL HIGH (ref <5.0)
Triglycerides: 480 mg/dL — ABNORMAL HIGH (ref <150)

## 2016-10-20 LAB — HEMOGLOBIN A1C
HEMOGLOBIN A1C: 12.1 % — AB (ref <5.7)
Mean Plasma Glucose: 301 mg/dL

## 2016-10-20 LAB — MICROALBUMIN / CREATININE URINE RATIO
Creatinine, Urine: 65 mg/dL (ref 20–370)
Microalb Creat Ratio: 851 mcg/mg creat — ABNORMAL HIGH (ref <30)
Microalb, Ur: 55.3 mg/dL

## 2016-10-20 LAB — VITAMIN D 25 HYDROXY (VIT D DEFICIENCY, FRACTURES): Vit D, 25-Hydroxy: 20 ng/mL — ABNORMAL LOW (ref 30–100)

## 2016-10-20 LAB — VITAMIN B12: Vitamin B-12: 698 pg/mL (ref 200–1100)

## 2016-10-20 LAB — TSH: TSH: 1.82 m[IU]/L (ref 0.40–4.50)

## 2016-10-20 MED ORDER — OMEPRAZOLE 40 MG PO CPDR: 40.0000 mg | DELAYED_RELEASE_CAPSULE | Freq: Every day | ORAL | 3 refills | Status: DC

## 2016-10-20 NOTE — Telephone Encounter (Signed)
-----   Message from Raylene Everts, MD sent at 10/20/2016 12:23 PM EST ----- Please call Me Mcnamara and let him know he needs to come back to the office PRIOR to his scheduled follow up to discuss his test results. (ASAP)

## 2016-10-20 NOTE — Telephone Encounter (Signed)
Called pt, will come in to see you tomorrow at 1340 to review lab results.

## 2016-10-20 NOTE — Telephone Encounter (Signed)
-----   Message from Raylene Everts, MD sent at 10/20/2016 12:23 PM EST ----- Please call Me Demchak and let him know he needs to come back to the office PRIOR to his scheduled follow up to discuss his test results. (ASAP)

## 2016-10-21 ENCOUNTER — Ambulatory Visit (INDEPENDENT_AMBULATORY_CARE_PROVIDER_SITE_OTHER): Payer: PRIVATE HEALTH INSURANCE | Admitting: Family Medicine

## 2016-10-21 ENCOUNTER — Encounter: Payer: Self-pay | Admitting: Family Medicine

## 2016-10-21 VITALS — BP 134/70 | HR 74 | Temp 97.9°F | Resp 18 | Ht 68.0 in | Wt 186.0 lb

## 2016-10-21 DIAGNOSIS — E559 Vitamin D deficiency, unspecified: Secondary | ICD-10-CM

## 2016-10-21 DIAGNOSIS — Z794 Long term (current) use of insulin: Secondary | ICD-10-CM

## 2016-10-21 DIAGNOSIS — E119 Type 2 diabetes mellitus without complications: Secondary | ICD-10-CM | POA: Diagnosis not present

## 2016-10-21 DIAGNOSIS — E785 Hyperlipidemia, unspecified: Secondary | ICD-10-CM | POA: Diagnosis not present

## 2016-10-21 DIAGNOSIS — I10 Essential (primary) hypertension: Secondary | ICD-10-CM

## 2016-10-21 DIAGNOSIS — IMO0001 Reserved for inherently not codable concepts without codable children: Secondary | ICD-10-CM

## 2016-10-21 LAB — HEPATITIS C RNA QUANTITATIVE: HCV Quantitative: NOT DETECTED IU/mL (ref <15)

## 2016-10-21 MED ORDER — ATORVASTATIN CALCIUM 40 MG PO TABS: 40.0000 mg | ORAL_TABLET | Freq: Every day | ORAL | 3 refills | Status: DC

## 2016-10-21 MED ORDER — VITAMIN D (ERGOCALCIFEROL) 1.25 MG (50000 UNIT) PO CAPS: 50000.0000 [IU] | ORAL_CAPSULE | ORAL | 0 refills | Status: DC

## 2016-10-21 MED ORDER — LISINOPRIL-HYDROCHLOROTHIAZIDE 10-12.5 MG PO TABS: 1.0000 | ORAL_TABLET | Freq: Every day | ORAL | 3 refills | Status: DC

## 2016-10-21 MED ORDER — DEXLANSOPRAZOLE 60 MG PO CPDR: 60.0000 mg | DELAYED_RELEASE_CAPSULE | Freq: Every day | ORAL | 3 refills | Status: DC

## 2016-10-21 MED ORDER — METFORMIN HCL ER (MOD) 1000 MG PO TB24: 1000.0000 mg | ORAL_TABLET | Freq: Every day | ORAL | 3 refills | Status: DC

## 2016-10-21 NOTE — Patient Instructions (Signed)
Take the metformin once a day before breakfast Watch for stomach upset  After a week or two add the lisinopril for kidneys and blood pressure  Next add the atorvastatin once a day  I have prescribed dexilant for the acid reflux  (Do not pick up the omeprazole)  Need blood work and a visit in 3 months to assess your improvement

## 2016-10-21 NOTE — Progress Notes (Signed)
Chief Complaint  Patient presents with  . Abnormal Lab   Called in early due to lab tests Cholesterol is elevated and needs to go back on a statin Diabetes uncontrolled with an A1c of over 12 Has proteinuria and hypertension so needs an ACE inhibitor Vit D  Deficient Hep C positive, with NO detectable virus, so does not need Tx.  He requested prevacid because has been on Nexium, omeprazole AND pantoprazole before, and they didn't work.  Will try dexilant, since the prevacid was denied by insurance.   Patient Active Problem List   Diagnosis Date Noted  . HLD (hyperlipidemia) 10/19/2016  . BPH (benign prostatic hyperplasia) 10/19/2016  . Essential hypertension 10/19/2016  . Diabetes mellitus, insulin dependent (IDDM), controlled ([place]) 10/19/2016  . Celiac sprue 05/23/2014  . Anemia, iron deficiency 04/19/2014  . GERD (gastroesophageal reflux disease) 04/19/2014    Outpatient Encounter Prescriptions as of 10/21/2016  Medication Sig  . tamsulosin (FLOMAX) 0.4 MG CAPS capsule Take 1 capsule (0.4 mg total) by mouth daily after supper.  Marland Kitchen atorvastatin (LIPITOR) 40 MG tablet Take 1 tablet (40 mg total) by mouth daily.  Marland Kitchen dexlansoprazole (DEXILANT) 60 MG capsule Take 1 capsule (60 mg total) by mouth daily.  Marland Kitchen lisinopril-hydrochlorothiazide (PRINZIDE,ZESTORETIC) 10-12.5 MG tablet Take 1 tablet by mouth daily.  . metFORMIN (GLUMETZA) 1000 MG (MOD) 24 hr tablet Take 1 tablet (1,000 mg total) by mouth daily with breakfast.  . Vitamin D, Ergocalciferol, (DRISDOL) 50000 units CAPS capsule Take 1 capsule (50,000 Units total) by mouth every 7 (seven) days.   No facility-administered encounter medications on file as of 10/21/2016.     No Known Allergies Results for BOYD, BUFFALO (MRN [id number]) as of 10/21/2016 15:09  Ref. Range 10/19/2016 14:17  Sodium Latest Ref Range: 135 - 146 mmol/L 135  Potassium Latest Ref Range: 3.5 - 5.3 mmol/L 4.6  Chloride Latest Ref Range: 98 - 110 mmol/L 104    CO2 Latest Ref Range: 20 - 31 mmol/L 22  Mean Plasma Glucose Latest Units: mg/dL 301  BUN Latest Ref Range: 7 - 25 mg/dL 24  Creatinine Latest Ref Range: 0.70 - 1.33 mg/dL 0.96  Calcium Latest Ref Range: 8.6 - 10.3 mg/dL 9.5  Glucose Latest Ref Range: 65 - 99 mg/dL 367 (H)  Alkaline Phosphatase Latest Ref Range: 40 - 115 U/L 95  Albumin Latest Ref Range: 3.6 - 5.1 g/dL 3.8  AST Latest Ref Range: 10 - 35 U/L 12  ALT Latest Ref Range: 9 - 46 U/L 14  Total Protein Latest Ref Range: 6.1 - 8.1 g/dL 6.4  Total Bilirubin Latest Ref Range: 0.2 - 1.2 mg/dL 0.6  GFR, Est African American Latest Ref Range: >=60 mL/min >89  GFR, Est Non African American Latest Ref Range: >=60 mL/min 89  Cholesterol Latest Ref Range: <200 mg/dL 210 (H)  Triglycerides Latest Ref Range: <150 mg/dL 480 (H)  HDL Cholesterol Latest Ref Range: >40 mg/dL 38 (L)  LDL (calc) Latest Ref Range: <100 mg/dL NOT CALC  VLDL Latest Ref Range: <30 mg/dL NOT CALC  Total CHOL/HDL Ratio Latest Ref Range: <5.0 Ratio 5.5 (H)  MICROALB/CREAT RATIO Latest Ref Range: <30 mcg/mg creat 851 (H)  Vitamin D, 25-Hydroxy Latest Ref Range: 30 - 100 ng/mL 20 (L)  Vitamin B12 Latest Ref Range: 200 - 1,100 pg/mL 698  Hemoglobin A1C Latest Ref Range: <5.7 % 12.1 (H)  TSH Latest Ref Range: 0.40 - 4.50 mIU/L 1.82  HCV Quantitative Latest Ref Range: <15 IU/mL  Not Detected  HCV Ab Latest Ref Range: NEGATIVE  REACTIVE (A)  Appearance Latest Ref Range: CLEAR  CLEAR  Bacteria, UA Latest Ref Range: NONE SEEN HPF NONE SEEN  Bilirubin Urine Latest Ref Range: NEGATIVE  NEGATIVE  Casts Latest Ref Range: NONE SEEN LPF NONE SEEN  Color, Urine Latest Ref Range: YELLOW  YELLOW  Crystals Latest Ref Range: NONE SEEN HPF NONE SEEN  Glucose Latest Ref Range: NEGATIVE  3+ (A)  Hgb urine dipstick Latest Ref Range: NEGATIVE  NEGATIVE  Ketones, ur Latest Ref Range: NEGATIVE  NEGATIVE  Leukocytes, UA Latest Ref Range: NEGATIVE  NEGATIVE  Nitrite Latest Ref Range:  NEGATIVE  NEGATIVE  pH Latest Ref Range: 5.0 - 8.0  5.5  Protein Latest Ref Range: NEGATIVE  2+ (A)  RBC / HPF Latest Ref Range: <=2 RBC/HPF NONE SEEN  Specific Gravity, Urine Latest Ref Range: 1.001 - 1.035  1.038 (H)  Squamous Epithelial / LPF Latest Ref Range: <=5 HPF NONE SEEN  WBC, UA Latest Ref Range: <=5 WBC/HPF NONE SEEN  Microalb, Ur Latest Ref Range: Not estab mg/dL 55.3  Creatinine, Urine Latest Ref Range: 20 - 370 mg/dL 65   Review of Systems  Constitutional: Negative for fatigue and unexpected weight change.  HENT: Negative for congestion and dental problem.   Eyes: Negative for redness and visual disturbance.  Respiratory: Negative for cough and shortness of breath.   Cardiovascular: Negative for chest pain and palpitations.  Gastrointestinal: Negative for constipation and diarrhea.  Genitourinary: Positive for difficulty urinating and frequency.  Musculoskeletal: Negative for arthralgias and back pain.  Neurological: Negative for dizziness and headaches.  Psychiatric/Behavioral: Negative for dysphoric mood. The patient is not nervous/anxious.    BP 134/70 (BP Location: Right Arm, Patient Position: Sitting, Cuff Size: Normal)   Pulse 74   Temp 97.9 F (36.6 C) (Oral)   Resp 18   Ht 5' 8"  (1.727 m)   Wt 186 lb 0.6 oz (84.4 kg)   SpO2 99%   BMI 28.29 kg/m   Physical Exam  Constitutional: He is oriented to person, place, and time. He appears well-developed and well-nourished. No distress.  HENT:  Head: Normocephalic and atraumatic.  Mouth/Throat: Oropharynx is clear and moist.  Eyes: Conjunctivae are normal. Pupils are equal, round, and reactive to light.  Cardiovascular: Normal rate, regular rhythm and normal heart sounds.   Pulmonary/Chest: Effort normal and breath sounds normal.  Neurological: He is alert and oriented to person, place, and time.  Skin: Skin is warm and dry.  Psychiatric: He has a normal mood and affect. His behavior is normal. Thought content  normal.    ASSESSMENT/PLAN:  1. Diabetes mellitus, insulin dependent (IDDM), controlled ([place])   2. Hyperlipidemia, unspecified hyperlipidemia type   3. Essential hypertension   4. Vitamin D deficiency    Patient Instructions  Take the metformin once a day before breakfast Watch for stomach upset  After a week or two add the lisinopril for kidneys and blood pressure  Next add the atorvastatin once a day  I have prescribed dexilant for the acid reflux  (Do not pick up the omeprazole)  Need blood work and a visit in 3 months to assess your improvement   Raylene Everts, MD

## 2016-10-22 ENCOUNTER — Other Ambulatory Visit: Payer: Self-pay

## 2016-10-22 MED ORDER — METFORMIN HCL 500 MG PO TABS: 500.0000 mg | ORAL_TABLET | Freq: Two times a day (BID) | ORAL | 0 refills | Status: DC

## 2016-10-25 ENCOUNTER — Telehealth: Payer: Self-pay | Admitting: Family Medicine

## 2016-10-25 NOTE — Telephone Encounter (Signed)
Brian Nielsen is asking if Dr. Meda Coffee could prescribe a muscle relaxer he just left the Chiropractors office which he placed a pain patch on the site, and he is to follow up with him in 1 wk Next Monday. Please advise?

## 2016-10-25 NOTE — Telephone Encounter (Signed)
May pend tizanidine 4 mg TID prn

## 2016-10-26 MED ORDER — TIZANIDINE HCL 4 MG PO TABS: 4.0000 mg | ORAL_TABLET | Freq: Four times a day (QID) | ORAL | 0 refills | Status: DC | PRN

## 2016-11-22 ENCOUNTER — Ambulatory Visit (INDEPENDENT_AMBULATORY_CARE_PROVIDER_SITE_OTHER): Payer: PRIVATE HEALTH INSURANCE | Admitting: Family Medicine

## 2016-11-22 ENCOUNTER — Encounter: Payer: Self-pay | Admitting: Family Medicine

## 2016-11-22 VITALS — BP 126/66 | HR 76 | Temp 98.4°F | Resp 18 | Ht 68.0 in | Wt 177.1 lb

## 2016-11-22 DIAGNOSIS — Z794 Long term (current) use of insulin: Secondary | ICD-10-CM | POA: Diagnosis not present

## 2016-11-22 DIAGNOSIS — E119 Type 2 diabetes mellitus without complications: Secondary | ICD-10-CM

## 2016-11-22 DIAGNOSIS — IMO0001 Reserved for inherently not codable concepts without codable children: Secondary | ICD-10-CM

## 2016-11-22 DIAGNOSIS — K219 Gastro-esophageal reflux disease without esophagitis: Secondary | ICD-10-CM

## 2016-11-22 DIAGNOSIS — I10 Essential (primary) hypertension: Secondary | ICD-10-CM | POA: Diagnosis not present

## 2016-11-22 DIAGNOSIS — E559 Vitamin D deficiency, unspecified: Secondary | ICD-10-CM | POA: Insufficient documentation

## 2016-11-22 DIAGNOSIS — M47814 Spondylosis without myelopathy or radiculopathy, thoracic region: Secondary | ICD-10-CM | POA: Diagnosis not present

## 2016-11-22 HISTORY — DX: Spondylosis without myelopathy or radiculopathy, thoracic region: M47.814

## 2016-11-22 LAB — POCT CBG (FASTING - GLUCOSE)-MANUAL ENTRY: GLUCOSE FASTING, POC: 421 mg/dL — AB (ref 70–99)

## 2016-11-22 MED ORDER — METFORMIN HCL 500 MG PO TABS: 1000.0000 mg | ORAL_TABLET | Freq: Two times a day (BID) | ORAL | 2 refills | Status: DC

## 2016-11-22 MED ORDER — METAXALONE 800 MG PO TABS: 800.0000 mg | ORAL_TABLET | Freq: Three times a day (TID) | ORAL | 1 refills | Status: DC

## 2016-11-22 NOTE — Progress Notes (Signed)
Chief Complaint  Patient presents with  . Follow-up    1 month   Here for follow up sooner than expected Is taking metformin 500 and fasting blood sugar is still over 300 Results for orders placed or performed in visit on 11/22/16  POCT CBG (Fasting - Glucose)  Result Value Ref Range   Glucose Fasting, POC 421 (A) 70 - 99 mg/dL  I am doubling the metformin to 1000 BID and will check A1c in 2 months  His BP is excellent on medication  He has chronic constipation - discussed fiber and fluids  He has thoracic back pain and saw a chiropractor  He brings in his films for review and he has thoracic spondylosis on the L only from t8 to t12.  Is taking glucosamine and chondroiten with turmeric.  He was given tizanidine for muscle relaxer, but this causes too much drowsiness.  Will try skelaxin  GERD well controlled on OTC prevacid.   Patient Active Problem List   Diagnosis Date Noted  . Thoracic spondylosis without myelopathy 11/22/2016  . HLD (hyperlipidemia) 10/19/2016  . BPH (benign prostatic hyperplasia) 10/19/2016  . Essential hypertension 10/19/2016  . Diabetes mellitus, insulin dependent (IDDM), controlled ([place]) 10/19/2016  . Celiac sprue 05/23/2014  . Anemia, iron deficiency 04/19/2014  . GERD (gastroesophageal reflux disease) 04/19/2014    Outpatient Encounter Prescriptions as of 11/22/2016  Medication Sig  . atorvastatin (LIPITOR) 40 MG tablet Take 1 tablet (40 mg total) by mouth daily.  . lansoprazole (PREVACID) 15 MG capsule Take 15 mg by mouth daily at 12 noon.  Marland Kitchen lisinopril-hydrochlorothiazide (PRINZIDE,ZESTORETIC) 10-12.5 MG tablet Take 1 tablet by mouth daily.  . metFORMIN (GLUCOPHAGE) 500 MG tablet Take 2 tablets (1,000 mg total) by mouth 2 (two) times daily with a meal.  . tamsulosin (FLOMAX) 0.4 MG CAPS capsule Take 1 capsule (0.4 mg total) by mouth daily after supper.  Marland Kitchen tiZANidine (ZANAFLEX) 4 MG tablet Take 1 tablet (4 mg total) by mouth every 6 (six) hours  as needed for muscle spasms.  . Vitamin D, Ergocalciferol, (DRISDOL) 50000 units CAPS capsule Take 1 capsule (50,000 Units total) by mouth every 7 (seven) days.  . metaxalone (SKELAXIN) 800 MG tablet Take 1 tablet (800 mg total) by mouth 3 (three) times daily.   No facility-administered encounter medications on file as of 11/22/2016.    No Known Allergies  Review of Systems  Constitutional: Negative for fatigue and unexpected weight change.  HENT: Negative for congestion and dental problem.   Eyes: Negative for redness and visual disturbance.  Respiratory: Negative for cough and shortness of breath.   Cardiovascular: Negative for chest pain and palpitations.  Gastrointestinal: Negative for constipation and diarrhea.  Genitourinary: Positive for frequency. Negative for difficulty urinating.  Musculoskeletal: Positive for back pain. Negative for arthralgias.  Neurological: Negative for dizziness and headaches.  Psychiatric/Behavioral: Negative for dysphoric mood. The patient is not nervous/anxious.     BP 126/66 (BP Location: Right Arm, Patient Position: Sitting, Cuff Size: Normal)   Pulse 76   Temp 98.4 F (36.9 C) (Oral)   Resp 18   Ht 5' 8"  (1.727 m)   Wt 177 lb 1.3 oz (80.3 kg)   SpO2 99%   BMI 26.92 kg/m   Physical Exam  Constitutional: He is oriented to person, place, and time. He appears well-developed and well-nourished.  HENT:  Head: Normocephalic and atraumatic.  Mouth/Throat: Oropharynx is clear and moist.  Poor dentition  Eyes: Conjunctivae are normal. Pupils  are equal, round, and reactive to light.  Neck: Normal range of motion. Neck supple. No thyromegaly present.  Cardiovascular: Normal rate, regular rhythm, normal heart sounds and intact distal pulses.   Pulmonary/Chest: Effort normal and breath sounds normal. No respiratory distress.  Musculoskeletal: Normal range of motion. He exhibits no edema.  Lymphadenopathy:    He has no cervical adenopathy.    Neurological: He is alert and oriented to person, place, and time. He displays abnormal reflex.  Gait normal  Skin: Skin is warm and dry.  Psychiatric: He has a normal mood and affect. His behavior is normal. Thought content normal.  Nursing note and vitals reviewed.   ASSESSMENT/PLAN:  1. Diabetes mellitus, NON insulin dependent (IDDM), controlled ([place]) * double metformin.  Reinforced diet and exercise - Ambulatory referral to Ophthalmology - POCT CBG (Fasting - Glucose)  2. Thoracic spondylosis without myelopathy *continue under care chiropractor  3. Essential hypertension *GOOD  4. Gastroesophageal reflux disease, esophagitis presence not specified *GOOD   Patient Instructions  Take metamucil sugar free once a day Drink plenty of water  increase the metformin to 1000 mg twice a day  Try skelaxin for muscle realxer  Need labs in march Follow up a week after blood work  Referred for diabetic eye exam     Raylene Everts, MD

## 2016-11-22 NOTE — Patient Instructions (Addendum)
Take metamucil sugar free once a day Drink plenty of water  increase the metformin to 1000 mg twice a day  Try skelaxin for muscle realxer  Need labs in march Follow up a week after blood work  Referred for diabetic eye exam

## 2016-12-29 ENCOUNTER — Other Ambulatory Visit: Payer: Self-pay | Admitting: Family Medicine

## 2017-01-11 DIAGNOSIS — Z8619 Personal history of other infectious and parasitic diseases: Secondary | ICD-10-CM | POA: Insufficient documentation

## 2017-01-11 DIAGNOSIS — E0842 Diabetes mellitus due to underlying condition with diabetic polyneuropathy: Secondary | ICD-10-CM | POA: Insufficient documentation

## 2017-01-11 DIAGNOSIS — E1142 Type 2 diabetes mellitus with diabetic polyneuropathy: Secondary | ICD-10-CM | POA: Insufficient documentation

## 2017-01-11 LAB — BASIC METABOLIC PANEL WITH GFR
BUN: 23 mg/dL (ref 7–25)
CALCIUM: 9.2 mg/dL (ref 8.6–10.3)
CO2: 24 mmol/L (ref 20–31)
CREATININE: 0.88 mg/dL (ref 0.70–1.33)
Chloride: 101 mmol/L (ref 98–110)
GFR, Est Non African American: >89 mL/min (ref >=60)
Glucose, Bld: 373 mg/dL — ABNORMAL HIGH (ref 65–99)
Potassium: 4.3 mmol/L (ref 3.5–5.3)
SODIUM: 133 mmol/L — AB (ref 135–146)

## 2017-01-11 LAB — LIPID PANEL
CHOL/HDL RATIO: 4.2 ratio (ref <5.0)
CHOLESTEROL: 122 mg/dL (ref <200)
HDL: 29 mg/dL — AB (ref >40)
LDL Cholesterol: 15 mg/dL (ref <100)
TRIGLYCERIDES: 388 mg/dL — AB (ref <150)
VLDL: 78 mg/dL — AB (ref <30)

## 2017-01-12 ENCOUNTER — Encounter: Payer: Self-pay | Admitting: Family Medicine

## 2017-01-12 DIAGNOSIS — IMO0001 Reserved for inherently not codable concepts without codable children: Secondary | ICD-10-CM

## 2017-01-12 DIAGNOSIS — E119 Type 2 diabetes mellitus without complications: Principal | ICD-10-CM

## 2017-01-12 DIAGNOSIS — Z794 Long term (current) use of insulin: Principal | ICD-10-CM

## 2017-01-12 LAB — VITAMIN D 25 HYDROXY (VIT D DEFICIENCY, FRACTURES): VIT D 25 HYDROXY: 24 ng/mL — AB (ref 30–100)

## 2017-01-12 LAB — HEMOGLOBIN A1C
HEMOGLOBIN A1C: 12.7 % — AB (ref <5.7)
MEAN PLASMA GLUCOSE: 318 mg/dL

## 2017-01-12 MED ORDER — GLIPIZIDE 5 MG PO TABS: 5.0000 mg | ORAL_TABLET | Freq: Two times a day (BID) | ORAL | 0 refills | Status: DC

## 2017-01-20 ENCOUNTER — Ambulatory Visit: Payer: PRIVATE HEALTH INSURANCE | Admitting: Family Medicine

## 2017-02-06 ENCOUNTER — Other Ambulatory Visit: Payer: Self-pay | Admitting: Family Medicine

## 2017-02-17 ENCOUNTER — Other Ambulatory Visit: Payer: Self-pay | Admitting: Family Medicine

## 2017-03-01 ENCOUNTER — Other Ambulatory Visit: Payer: Self-pay | Admitting: Family Medicine

## 2018-08-24 DIAGNOSIS — Z23 Encounter for immunization: Secondary | ICD-10-CM | POA: Diagnosis not present

## 2018-12-08 DIAGNOSIS — Z125 Encounter for screening for malignant neoplasm of prostate: Secondary | ICD-10-CM | POA: Diagnosis not present

## 2018-12-08 DIAGNOSIS — Z8619 Personal history of other infectious and parasitic diseases: Secondary | ICD-10-CM | POA: Diagnosis not present

## 2018-12-08 DIAGNOSIS — I10 Essential (primary) hypertension: Secondary | ICD-10-CM | POA: Diagnosis not present

## 2018-12-08 DIAGNOSIS — E118 Type 2 diabetes mellitus with unspecified complications: Secondary | ICD-10-CM | POA: Diagnosis not present

## 2018-12-08 DIAGNOSIS — E7800 Pure hypercholesterolemia, unspecified: Secondary | ICD-10-CM | POA: Diagnosis not present

## 2018-12-08 DIAGNOSIS — E0842 Diabetes mellitus due to underlying condition with diabetic polyneuropathy: Secondary | ICD-10-CM | POA: Diagnosis not present

## 2018-12-08 DIAGNOSIS — E1165 Type 2 diabetes mellitus with hyperglycemia: Secondary | ICD-10-CM | POA: Diagnosis not present

## 2018-12-15 DIAGNOSIS — E782 Mixed hyperlipidemia: Secondary | ICD-10-CM | POA: Diagnosis not present

## 2018-12-15 DIAGNOSIS — Z0000 Encounter for general adult medical examination without abnormal findings: Secondary | ICD-10-CM | POA: Diagnosis not present

## 2018-12-15 DIAGNOSIS — I10 Essential (primary) hypertension: Secondary | ICD-10-CM | POA: Diagnosis not present

## 2018-12-29 DIAGNOSIS — E1169 Type 2 diabetes mellitus with other specified complication: Secondary | ICD-10-CM | POA: Diagnosis not present

## 2018-12-29 DIAGNOSIS — E1142 Type 2 diabetes mellitus with diabetic polyneuropathy: Secondary | ICD-10-CM | POA: Diagnosis not present

## 2018-12-29 DIAGNOSIS — I10 Essential (primary) hypertension: Secondary | ICD-10-CM | POA: Diagnosis not present

## 2019-01-12 DIAGNOSIS — N528 Other male erectile dysfunction: Secondary | ICD-10-CM | POA: Insufficient documentation

## 2019-01-12 DIAGNOSIS — E782 Mixed hyperlipidemia: Secondary | ICD-10-CM | POA: Diagnosis not present

## 2019-01-12 DIAGNOSIS — I10 Essential (primary) hypertension: Secondary | ICD-10-CM | POA: Diagnosis not present

## 2019-01-12 DIAGNOSIS — K219 Gastro-esophageal reflux disease without esophagitis: Secondary | ICD-10-CM | POA: Diagnosis not present

## 2019-01-23 DIAGNOSIS — G43109 Migraine with aura, not intractable, without status migrainosus: Secondary | ICD-10-CM | POA: Diagnosis not present

## 2019-01-23 DIAGNOSIS — R5383 Other fatigue: Secondary | ICD-10-CM | POA: Diagnosis not present

## 2019-01-23 DIAGNOSIS — R0602 Shortness of breath: Secondary | ICD-10-CM | POA: Diagnosis not present

## 2019-01-23 DIAGNOSIS — R0609 Other forms of dyspnea: Secondary | ICD-10-CM | POA: Diagnosis not present

## 2019-01-23 DIAGNOSIS — H539 Unspecified visual disturbance: Secondary | ICD-10-CM | POA: Diagnosis not present

## 2019-02-07 DIAGNOSIS — J40 Bronchitis, not specified as acute or chronic: Secondary | ICD-10-CM | POA: Diagnosis not present

## 2019-02-07 DIAGNOSIS — E1169 Type 2 diabetes mellitus with other specified complication: Secondary | ICD-10-CM | POA: Diagnosis not present

## 2019-02-07 DIAGNOSIS — I10 Essential (primary) hypertension: Secondary | ICD-10-CM | POA: Diagnosis not present

## 2019-02-09 DIAGNOSIS — E1142 Type 2 diabetes mellitus with diabetic polyneuropathy: Secondary | ICD-10-CM | POA: Diagnosis not present

## 2019-02-09 DIAGNOSIS — J209 Acute bronchitis, unspecified: Secondary | ICD-10-CM | POA: Diagnosis not present

## 2019-02-09 DIAGNOSIS — I10 Essential (primary) hypertension: Secondary | ICD-10-CM | POA: Diagnosis not present

## 2019-02-09 DIAGNOSIS — J40 Bronchitis, not specified as acute or chronic: Secondary | ICD-10-CM | POA: Diagnosis not present

## 2019-02-09 DIAGNOSIS — E1169 Type 2 diabetes mellitus with other specified complication: Secondary | ICD-10-CM | POA: Diagnosis not present

## 2019-03-26 DIAGNOSIS — E1169 Type 2 diabetes mellitus with other specified complication: Secondary | ICD-10-CM | POA: Diagnosis not present

## 2019-03-26 DIAGNOSIS — E1142 Type 2 diabetes mellitus with diabetic polyneuropathy: Secondary | ICD-10-CM | POA: Diagnosis not present

## 2019-03-26 DIAGNOSIS — I10 Essential (primary) hypertension: Secondary | ICD-10-CM | POA: Diagnosis not present

## 2019-03-26 DIAGNOSIS — E785 Hyperlipidemia, unspecified: Secondary | ICD-10-CM | POA: Diagnosis not present

## 2019-04-20 DIAGNOSIS — R7989 Other specified abnormal findings of blood chemistry: Secondary | ICD-10-CM | POA: Insufficient documentation

## 2019-08-06 ENCOUNTER — Other Ambulatory Visit (HOSPITAL_COMMUNITY): Payer: Self-pay | Admitting: Podiatry

## 2019-08-06 ENCOUNTER — Other Ambulatory Visit: Payer: Self-pay | Admitting: Podiatry

## 2019-08-06 DIAGNOSIS — M79604 Pain in right leg: Secondary | ICD-10-CM

## 2019-08-10 ENCOUNTER — Ambulatory Visit (HOSPITAL_COMMUNITY)
Admission: RE | Admit: 2019-08-10 | Discharge: 2019-08-10 | Disposition: A | Discharge status: Home / Self Care | Payer: 59 | Source: Ambulatory Visit | Attending: Podiatry | Admitting: Podiatry

## 2019-08-10 ENCOUNTER — Other Ambulatory Visit: Payer: Self-pay

## 2019-08-10 DIAGNOSIS — M79605 Pain in left leg: Secondary | ICD-10-CM | POA: Diagnosis present

## 2019-08-10 DIAGNOSIS — M79604 Pain in right leg: Secondary | ICD-10-CM | POA: Diagnosis not present

## 2019-09-20 ENCOUNTER — Telehealth: Payer: Self-pay | Admitting: Diagnostic Neuroimaging

## 2019-09-20 NOTE — Telephone Encounter (Signed)
I have called patient and LVM regarding rescheduling 12/28 appt due to MD out. If patient calls back (new patient) he may be r/s to 12/2 (reschedule day) if there are open spots.

## 2019-11-06 ENCOUNTER — Ambulatory Visit: Payer: 59 | Admitting: Diagnostic Neuroimaging

## 2019-11-12 ENCOUNTER — Ambulatory Visit: Payer: 59 | Admitting: Diagnostic Neuroimaging

## 2019-11-21 ENCOUNTER — Ambulatory Visit: Payer: 59 | Admitting: Diagnostic Neuroimaging

## 2019-12-18 ENCOUNTER — Encounter: Payer: Self-pay | Admitting: *Deleted

## 2019-12-18 ENCOUNTER — Other Ambulatory Visit: Payer: Self-pay | Admitting: *Deleted

## 2019-12-19 ENCOUNTER — Ambulatory Visit: Payer: 59 | Admitting: Diagnostic Neuroimaging

## 2019-12-19 ENCOUNTER — Other Ambulatory Visit: Payer: Self-pay

## 2019-12-19 ENCOUNTER — Encounter: Payer: Self-pay | Admitting: Diagnostic Neuroimaging

## 2019-12-19 VITALS — BP 142/68 | HR 64 | Temp 98.0°F | Ht 68.0 in | Wt 194.4 lb

## 2019-12-19 DIAGNOSIS — E1142 Type 2 diabetes mellitus with diabetic polyneuropathy: Secondary | ICD-10-CM

## 2019-12-19 MED ORDER — AMITRIPTYLINE HCL 25 MG PO TABS: 25.0000 mg | ORAL_TABLET | Freq: Every day | ORAL | 3 refills | Status: DC

## 2019-12-19 NOTE — Progress Notes (Signed)
[facility]  PATIENT: Brian Nielsen DOB: [birth year]  REFERRING CLINICIAN: Dion Body, MD HISTORY FROM: patient and wife  REASON FOR VISIT: new consult    HISTORICAL  CHIEF COMPLAINT:  Chief Complaint  Patient presents with  . Pain    rm 7 New Pt wife- Brian Nielsen, "pain in feet x 2 years on and off, gabapentin not helping; couldn't tolerate pregabalin or duloxetine"    HISTORY OF PRESENT ILLNESS:   59 year old male here for evaluation of lower extremity pain.  Patient has diagnosis of type 2 diabetes since age 80 years old, with suboptimal control until this year.  Last year patient had hemoglobin A1c greater than 12.  Recent A1c's have ranged from 6.9-7.5.  For past 3 years patient has had numbness, pain, burning sensation in his toes and feet.  Symptoms have worsened in the past 1 year.  He has some history of thoracic and lumbar spine pain, has seen chiropractor in the past.  Now patient having some numbness and pain in his fingertips.  Patient is tried gabapentin without relief.  Also tried Cymbalta and Lyrica but these caused side effects.  He is reluctant to try stronger pain medication such as opiates.   REVIEW OF SYSTEMS: Full 14 system review of systems performed and negative with exception of: Constipation urination problem impotence.  ALLERGIES: Allergies  Allergen Reactions  . Duloxetine Other (See Comments)    "disoriented, confused, couldn't drive"  . Pregabalin Nausea Only    HOME MEDICATIONS: Outpatient Medications Prior to Visit  Medication Sig Dispense Refill  . Ascorbic Acid (VITAMIN C) 1000 MG tablet Take 1,000 mg by mouth daily.    Marland Kitchen aspirin 81 MG EC tablet Take 81 mg by mouth daily.    Marland Kitchen atorvastatin (LIPITOR) 40 MG tablet Take 1 tablet (40 mg total) by mouth daily. 90 tablet 3  . Cholecalciferol (VITAMIN D3) 125 MCG (5000 UT) CAPS Take by mouth.    . gabapentin (NEURONTIN) 300 MG capsule Take 3 capsules (900 mg three times  daily, max 10 capsules daily    . glipiZIDE (GLUCOTROL) 5 MG tablet Take 1 tablet (5 mg total) by mouth 2 (two) times daily before a meal. 60 tablet 0  . ibuprofen (ADVIL) 200 MG tablet Take 200 mg by mouth every 6 (six) hours as needed.    Marland Kitchen JARDIANCE 25 MG TABS tablet Take 25 mg by mouth daily.    . lansoprazole (PREVACID) 15 MG capsule Take 15 mg by mouth daily at 12 noon.    Marland Kitchen lisinopril (ZESTRIL) 10 MG tablet Take 10 mg by mouth daily.    . metFORMIN (GLUCOPHAGE) 500 MG tablet Take 2 tablets (1,000 mg total) by mouth 2 (two) times daily with a meal. 120 tablet 2  . pantoprazole (PROTONIX) 40 MG tablet Take 40 mg by mouth daily.    . tamsulosin (FLOMAX) 0.4 MG CAPS capsule Take 1 capsule (0.4 mg total) by mouth daily after supper. 30 capsule 3  . lansoprazole (PREVACID) 15 MG capsule Take 15 mg by mouth daily at 12 noon.    Marland Kitchen lisinopril-hydrochlorothiazide (PRINZIDE,ZESTORETIC) 10-12.5 MG tablet Take 1 tablet by mouth daily. 90 tablet 3  . metaxalone (SKELAXIN) 800 MG tablet Take 1 tablet (800 mg total) by mouth 3 (three) times daily. 30 tablet 1  . tiZANidine (ZANAFLEX) 4 MG tablet Take 1 tablet (4 mg total) by mouth every 6 (six) hours as needed for muscle spasms. 30 tablet 0  . Vitamin D, Ergocalciferol, (  DRISDOL) 50000 units CAPS capsule Take 1 capsule (50,000 Units total) by mouth every 7 (seven) days. 12 capsule 0   No facility-administered medications prior to visit.    PAST MEDICAL HISTORY: Past Medical History:  Diagnosis Date  . Anemia   . BPH (benign prostatic hyperplasia)   . DM (diabetes mellitus) ([place])   . GERD (gastroesophageal reflux disease)   . HTN (hypertension)   . Hyperlipidemia   . Thoracic spondylosis without myelopathy 11/22/2016    PAST SURGICAL HISTORY: Past Surgical History:  Procedure Laterality Date  . COLONOSCOPY  2003   Dr. Laural Golden: two small polyps, small ext hemorrhoids.path:focal adenomatous changes  . COLONOSCOPY N/A 05/03/2014   Procedure:  COLONOSCOPY;  Surgeon: Danie Binder, MD;  [facility];  Service: Endoscopy;  Laterality: N/A;  12:00  . ESOPHAGOGASTRODUODENOSCOPY N/A 05/03/2014   Procedure: ESOPHAGOGASTRODUODENOSCOPY (EGD);  Surgeon: Danie Binder, MD;  Location: [facility];  Service: Endoscopy;  Laterality: N/A;    FAMILY HISTORY: Family History  Problem Relation Age of Onset  . Heart disease Father   . Diabetes Father   . Hypertension Father   . Hyperlipidemia Father   . Aneurysm Mother        brain  . Hypertension Mother   . Cancer Maternal Grandmother        melanoma  . Heart disease Paternal Grandfather   . Colon cancer Neg Hx     SOCIAL HISTORY: Social History   Socioeconomic History  . Marital status: Married    Spouse name: [name]  . Number of children: 1  . Years of education: 37  . Highest education level: Not on file  Occupational History  . Occupation: Programmer, systems: [employer]    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  Tobacco Use  . Smoking status: Former Smoker    Quit date: 12/18/2008    Years since quitting: 11.0  . Smokeless tobacco: Current User    Types: Snuff  . Tobacco comment: Quit x 30 years  Substance and Sexual Activity  . Alcohol use: Yes    Comment: occasionally on weekends  . Drug use: No  . Sexual activity: Yes    Birth control/protection: Surgical  Other Topics Concern  . Not on file  Social History Narrative   Lives with wife   [place]   Caffeine- diet Mtn Dew, 2 glasses   Social Determinants of Health   Financial Resource Strain:   . Difficulty of Paying Living Expenses: Not on file  Food Insecurity:   . Worried About Charity fundraiser in the Last Year: Not on file  . Ran Out of Food in the Last Year: Not on file  Transportation Needs:   . Lack of Transportation (Medical): Not on file  . Lack of Transportation (Non-Medical): Not on file  Physical Activity:   . Days of Exercise per Week: Not on  file  . Minutes of Exercise per Session: Not on file  Stress:   . Feeling of Stress : Not on file  Social Connections:   . Frequency of Communication with Friends and Family: Not on file  . Frequency of Social Gatherings with Friends and Family: Not on file  . Attends Religious Services: Not on file  . Active Member of Clubs or Organizations: Not on file  . Attends Archivist Meetings: Not on file  . Marital Status: Not on file  Intimate Partner Violence:   .  Fear of Current or Ex-Partner: Not on file  . Emotionally Abused: Not on file  . Physically Abused: Not on file  . Sexually Abused: Not on file     PHYSICAL EXAM  GENERAL EXAM/CONSTITUTIONAL: Vitals:  Vitals:   12/19/19 0808  BP: (!) 142/68  Pulse: 64  Temp: 98 F (36.7 C)  Weight: 194 lb 6.4 oz (88.2 kg)  Height: 5' 8"  (1.727 m)     Body mass index is 29.56 kg/m. Wt Readings from Last 3 Encounters:  12/19/19 194 lb 6.4 oz (88.2 kg)  11/22/16 177 lb 1.3 oz (80.3 kg)  10/21/16 186 lb 0.6 oz (84.4 kg)     Patient is in no distress; well developed, nourished and groomed; neck is supple  CARDIOVASCULAR:  Examination of carotid arteries is normal; no carotid bruits  Regular rate and rhythm, no murmurs  Examination of peripheral vascular system by observation and palpation is normal  EYES:  Ophthalmoscopic exam of optic discs and posterior segments is normal; no papilledema or hemorrhages  No exam data present  MUSCULOSKELETAL:  Gait, strength, tone, movements noted in Neurologic exam below  NEUROLOGIC: MENTAL STATUS:  No flowsheet data found.  awake, alert, oriented to person, place and time  recent and remote memory intact  normal attention and concentration  language fluent, comprehension intact, naming intact  fund of knowledge appropriate  CRANIAL NERVE:   2nd - no papilledema on fundoscopic exam  2nd, 3rd, 4th, 6th - pupils equal and reactive to light, visual fields full  to confrontation, extraocular muscles intact, no nystagmus  5th - facial sensation symmetric  7th - facial strength symmetric  8th - hearing intact  9th - palate elevates symmetrically, uvula midline  11th - shoulder shrug symmetric  12th - tongue protrusion midline  MOTOR:   normal bulk and tone, full strength in the BUE, BLE; EXCEPT DECR HIP FLEX (R 3-4;  L4)  SENSORY:   normal and symmetric to light touch, temperature, vibration; DECR VIB IN FEET  COORDINATION:   finger-nose-finger, fine finger movements normal  REFLEXES:   deep tendon reflexes TRACE and symmetric; ABSENT AT ANKLES  GAIT/STATION:   narrow based gait     DIAGNOSTIC DATA (LABS, IMAGING, TESTING) - I reviewed patient records, labs, notes, testing and imaging myself where available.  No results found for: WBC, HGB, HCT, MCV, PLT    Component Value Date/Time   NA 133 (L) 01/11/2017 0805   K 4.3 01/11/2017 0805   CL 101 01/11/2017 0805   CO2 24 01/11/2017 0805   GLUCOSE 373 (H) 01/11/2017 0805   BUN 23 01/11/2017 0805   CREATININE 0.88 01/11/2017 0805   CALCIUM 9.2 01/11/2017 0805   PROT 6.4 10/19/2016 1417   ALBUMIN 3.8 10/19/2016 1417   AST 12 10/19/2016 1417   ALT 14 10/19/2016 1417   ALKPHOS 95 10/19/2016 1417   BILITOT 0.6 10/19/2016 1417   GFRNONAA >89 01/11/2017 0805   GFRAA >89 01/11/2017 0805   Lab Results  Component Value Date   CHOL 122 01/11/2017   HDL 29 (L) 01/11/2017   LDLCALC 15 01/11/2017   TRIG 388 (H) 01/11/2017   CHOLHDL 4.2 01/11/2017   Lab Results  Component Value Date   HGBA1C 12.7 (H) 01/11/2017   Lab Results  Component Value Date   VITAMINB12 698 10/19/2016   Lab Results  Component Value Date   TSH 1.82 10/19/2016       ASSESSMENT AND PLAN  59 y.o. year old male  here with type 2 diabetes since age 18 years old, with lower extremity paresthesias and pain since 2018.  Consistent with diabetic peripheral neuropathy.   Meds tried and failed:  gabapentin, cymbalta, lyrica  Dx:  1. Diabetic polyneuropathy associated with type 2 diabetes mellitus (HCC)     PLAN:  DIABETIC NEUROPATHIC PAIN  - check neuropathy / myopathy labs (rule out other causes) - continue gabapentin 94m three times a day  - START alpha lipoic acid 6038mtwice a day  - START amitriptyline 2558mt bedtime - consider pain mgmt clinic referral  Orders Placed This Encounter  Procedures  . ANA,IFA RA Diag Pnl w/rflx Tit/Patn  . Vitamin B12  . TSH  . CK   Meds ordered this encounter  Medications  . amitriptyline (ELAVIL) 25 MG tablet    Sig: Take 1 tablet (25 mg total) by mouth at bedtime.    Dispense:  30 tablet    Refill:  3   Return for pending if symptoms worsen or fail to improve.    VIKPenni BombardD 2/39/02/7124:52:71 Certified in Neurology, Neurophysiology and Neuroimaging  [facility] [id number] [phone]

## 2019-12-19 NOTE — Patient Instructions (Signed)
  DIABETIC NEUROPATHIC PAIN  - check neuropathy / myopathy labs (rule out other causes) - continue gabapentin 981m three times a day  - START alpha lipoic acid 6069mtwice a day  - START amitriptyline 2593mt bedtime - consider pain mgmt clinic referral

## 2019-12-21 LAB — VITAMIN B12: Vitamin B-12: >2000 pg/mL — ABNORMAL HIGH (ref 232–1245)

## 2019-12-21 LAB — CK: Total CK: 98 U/L (ref 41–331)

## 2019-12-21 LAB — ANA,IFA RA DIAG PNL W/RFLX TIT/PATN
ANA Titer 1: NEGATIVE
Cyclic Citrullin Peptide Ab: 7 units (ref 0–19)
Rheumatoid fact SerPl-aCnc: <10 IU/mL (ref 0.0–13.9)

## 2019-12-21 LAB — TSH: TSH: 5.23 u[IU]/mL — ABNORMAL HIGH (ref 0.450–4.500)

## 2019-12-22 ENCOUNTER — Telehealth: Payer: Self-pay | Admitting: Diagnostic Neuroimaging

## 2019-12-23 NOTE — Telephone Encounter (Signed)
Error

## 2019-12-24 ENCOUNTER — Telehealth: Payer: Self-pay | Admitting: *Deleted

## 2019-12-24 NOTE — Telephone Encounter (Signed)
LVM informing patient his labs are ok except a slightly high TSH which he should follow up with PCP. [facility], left # for questions.

## 2020-05-29 ENCOUNTER — Ambulatory Visit
Admission: RE | Admit: 2020-05-29 | Discharge: 2020-05-29 | Disposition: A | Discharge status: Home / Self Care | Payer: 59 | Source: Ambulatory Visit | Attending: Gastroenterology | Admitting: Gastroenterology

## 2020-05-29 ENCOUNTER — Other Ambulatory Visit: Payer: Self-pay

## 2020-05-29 ENCOUNTER — Other Ambulatory Visit: Payer: Self-pay | Admitting: Gastroenterology

## 2020-05-29 ENCOUNTER — Other Ambulatory Visit (HOSPITAL_COMMUNITY): Payer: Self-pay | Admitting: Gastroenterology

## 2020-05-29 DIAGNOSIS — K529 Noninfective gastroenteritis and colitis, unspecified: Secondary | ICD-10-CM | POA: Diagnosis present

## 2020-05-29 DIAGNOSIS — R143 Flatulence: Secondary | ICD-10-CM

## 2020-05-29 DIAGNOSIS — R1084 Generalized abdominal pain: Secondary | ICD-10-CM | POA: Insufficient documentation

## 2020-05-29 DIAGNOSIS — K900 Celiac disease: Secondary | ICD-10-CM

## 2020-05-29 DIAGNOSIS — R140 Abdominal distension (gaseous): Secondary | ICD-10-CM | POA: Insufficient documentation

## 2020-05-29 LAB — POCT I-STAT CREATININE: Creatinine, Ser: 1.5 mg/dL — ABNORMAL HIGH (ref 0.61–1.24)

## 2020-05-29 MED ORDER — IOHEXOL 300 MG/ML  SOLN
100.0000 mL | Freq: Once | INTRAMUSCULAR | Status: AC | PRN
  Administered 2020-05-29: 100 mL via INTRAVENOUS

## 2020-06-02 ENCOUNTER — Other Ambulatory Visit: Payer: Self-pay

## 2020-06-02 ENCOUNTER — Other Ambulatory Visit
Admission: RE | Admit: 2020-06-02 | Discharge: 2020-06-02 | Disposition: A | Discharge status: Home / Self Care | Payer: 59 | Source: Ambulatory Visit | Attending: Gastroenterology | Admitting: Gastroenterology

## 2020-06-02 ENCOUNTER — Encounter: Payer: Self-pay | Admitting: *Deleted

## 2020-06-02 DIAGNOSIS — Z01812 Encounter for preprocedural laboratory examination: Secondary | ICD-10-CM | POA: Diagnosis not present

## 2020-06-02 DIAGNOSIS — Z20822 Contact with and (suspected) exposure to covid-19: Secondary | ICD-10-CM | POA: Insufficient documentation

## 2020-06-02 LAB — SARS CORONAVIRUS 2 (TAT 6-24 HRS): SARS Coronavirus 2: NEGATIVE

## 2020-06-03 ENCOUNTER — Other Ambulatory Visit: Payer: Self-pay

## 2020-06-03 ENCOUNTER — Ambulatory Visit: Payer: 59 | Admitting: Certified Registered"

## 2020-06-03 ENCOUNTER — Encounter: Payer: Self-pay | Admitting: *Deleted

## 2020-06-03 ENCOUNTER — Ambulatory Visit
Admission: RE | Admit: 2020-06-03 | Discharge: 2020-06-03 | Disposition: A | Discharge status: Home / Self Care | Payer: 59 | Attending: Gastroenterology | Admitting: Gastroenterology

## 2020-06-03 ENCOUNTER — Encounter
Admission: RE | Disposition: A | Discharge status: Home / Self Care | Payer: Self-pay | Source: Home / Self Care | Attending: Gastroenterology

## 2020-06-03 DIAGNOSIS — E119 Type 2 diabetes mellitus without complications: Secondary | ICD-10-CM | POA: Insufficient documentation

## 2020-06-03 DIAGNOSIS — Z7984 Long term (current) use of oral hypoglycemic drugs: Secondary | ICD-10-CM | POA: Insufficient documentation

## 2020-06-03 DIAGNOSIS — Z79899 Other long term (current) drug therapy: Secondary | ICD-10-CM | POA: Insufficient documentation

## 2020-06-03 DIAGNOSIS — N400 Enlarged prostate without lower urinary tract symptoms: Secondary | ICD-10-CM | POA: Insufficient documentation

## 2020-06-03 DIAGNOSIS — I10 Essential (primary) hypertension: Secondary | ICD-10-CM | POA: Insufficient documentation

## 2020-06-03 DIAGNOSIS — K219 Gastro-esophageal reflux disease without esophagitis: Secondary | ICD-10-CM | POA: Insufficient documentation

## 2020-06-03 DIAGNOSIS — Z7982 Long term (current) use of aspirin: Secondary | ICD-10-CM | POA: Diagnosis not present

## 2020-06-03 DIAGNOSIS — K900 Celiac disease: Secondary | ICD-10-CM | POA: Insufficient documentation

## 2020-06-03 DIAGNOSIS — R1013 Epigastric pain: Secondary | ICD-10-CM | POA: Diagnosis not present

## 2020-06-03 DIAGNOSIS — E785 Hyperlipidemia, unspecified: Secondary | ICD-10-CM | POA: Insufficient documentation

## 2020-06-03 HISTORY — PX: ESOPHAGOGASTRODUODENOSCOPY (EGD) WITH PROPOFOL: SHX5813

## 2020-06-03 LAB — GLUCOSE, CAPILLARY: Glucose-Capillary: 162 mg/dL — ABNORMAL HIGH (ref 70–99)

## 2020-06-03 SURGERY — ESOPHAGOGASTRODUODENOSCOPY (EGD) WITH PROPOFOL
Anesthesia: General

## 2020-06-03 MED ORDER — LIDOCAINE HCL (PF) 2 % IJ SOLN
INTRAMUSCULAR | Status: AC
  Filled 2020-06-03: qty 5

## 2020-06-03 MED ORDER — SODIUM CHLORIDE 0.9 % IV SOLN: INTRAVENOUS | Status: DC

## 2020-06-03 MED ORDER — GLYCOPYRROLATE 0.2 MG/ML IJ SOLN
INTRAMUSCULAR | Status: AC
  Filled 2020-06-03: qty 1

## 2020-06-03 MED ORDER — PROPOFOL 10 MG/ML IV BOLUS
INTRAVENOUS | Status: AC
  Filled 2020-06-03: qty 20

## 2020-06-03 MED ORDER — PROPOFOL 10 MG/ML IV BOLUS
INTRAVENOUS | Status: DC | PRN
  Administered 2020-06-03: 80 mg via INTRAVENOUS

## 2020-06-03 NOTE — Anesthesia Preprocedure Evaluation (Addendum)
Anesthesia Evaluation  Patient identified by MRN, date of birth, ID band Patient awake    Reviewed: Allergy & Precautions, H&P , NPO status , Patient's Chart, lab work & pertinent test results  Airway Mallampati: III  TM Distance: >3 FB     Dental   Pulmonary neg pulmonary ROS,    breath sounds clear to auscultation       Cardiovascular hypertension,  Rhythm:regular Rate:Normal     Neuro/Psych negative neurological ROS  negative psych ROS   GI/Hepatic Neg liver ROS, GERD  ,  Endo/Other  diabetes  Renal/GU negative Renal ROS  negative genitourinary   Musculoskeletal   Abdominal   Peds  Hematology negative hematology ROS (+)   Anesthesia Other Findings Past Medical History: No date: Anemia No date: BPH (benign prostatic hyperplasia) No date: DM (diabetes mellitus) (HCC) No date: GERD (gastroesophageal reflux disease) No date: HTN (hypertension) No date: Hyperlipidemia 11/22/2016: Thoracic spondylosis without myelopathy  Past Surgical History: 2003: COLONOSCOPY     Comment:  Dr. Laural Golden: two small polyps, small ext               hemorrhoids.path:focal adenomatous changes 05/03/2014: COLONOSCOPY; N/A     Comment:  Procedure: COLONOSCOPY;  Surgeon: Danie Binder, MD;                [facility];  Service: Endoscopy;                Laterality: N/A;  12:00 05/03/2014: ESOPHAGOGASTRODUODENOSCOPY; N/A     Comment:  Procedure: ESOPHAGOGASTRODUODENOSCOPY (EGD);  Surgeon:               Danie Binder, MD;  Location: [facility];  Service:               Endoscopy;  Laterality: N/A;  BMI    Body Mass Index: 26.61 kg/m      Reproductive/Obstetrics negative OB ROS                            Anesthesia Physical Anesthesia Plan  ASA: III  Anesthesia Plan: General   Post-op Pain Management:    Induction:   PONV Risk Score and Plan: Propofol infusion and TIVA  Airway  Management Planned: Nasal Cannula  Additional Equipment:   Intra-op Plan:   Post-operative Plan:   Informed Consent: I have reviewed the patients History and Physical, chart, labs and discussed the procedure including the risks, benefits and alternatives for the proposed anesthesia with the patient or authorized representative who has indicated his/her understanding and acceptance.     Dental Advisory Given  Plan Discussed with: Anesthesiologist, CRNA and Surgeon  Anesthesia Plan Comments:         Anesthesia Quick Evaluation

## 2020-06-03 NOTE — Interval H&P Note (Signed)
History and Physical Interval Note:  06/03/2020 9:21 AM  Brian Nielsen  has presented today for surgery, with the diagnosis of CELIAC SPRUE.  The various methods of treatment have been discussed with the patient and family. After consideration of risks, benefits and other options for treatment, the patient has consented to  Procedure(s): ESOPHAGOGASTRODUODENOSCOPY (EGD) WITH PROPOFOL (N/A) as a surgical intervention.  The patient's history has been reviewed, patient examined, no change in status, stable for surgery.  I have reviewed the patient's chart and labs.  Questions were answered to the patient's satisfaction.     [name]  Ok to proceed with EGD

## 2020-06-03 NOTE — Anesthesia Postprocedure Evaluation (Signed)
Anesthesia Post Note  Patient: Brian Nielsen  Procedure(s) Performed: ESOPHAGOGASTRODUODENOSCOPY (EGD) WITH PROPOFOL (N/A )  Patient location during evaluation: PACU Anesthesia Type: General Level of consciousness: awake and alert Pain management: pain level controlled Vital Signs Assessment: post-procedure vital signs reviewed and stable Respiratory status: spontaneous breathing, nonlabored ventilation and respiratory function stable Cardiovascular status: blood pressure returned to baseline and stable Postop Assessment: no apparent nausea or vomiting Anesthetic complications: no   No complications documented.   Last Vitals:  Vitals:   06/03/20 0850 06/03/20 0937  BP: 126/70 (!) 148/78  Pulse: 60 64  Resp: 16 12  Temp: (!) 36.3 C (!) 35.7 C  SpO2: 100% 100%    Last Pain:  Vitals:   06/03/20 0937  TempSrc: Temporal  PainSc: 0-No pain                 Brett Canales Santanna Whitford

## 2020-06-03 NOTE — Transfer of Care (Signed)
Immediate Anesthesia Transfer of Care Note  Patient: Brian Nielsen  Procedure(s) Performed: ESOPHAGOGASTRODUODENOSCOPY (EGD) WITH PROPOFOL (N/A )  Patient Location: PACU and Endoscopy Unit  Anesthesia Type:General  Level of Consciousness: drowsy  Airway & Oxygen Therapy: Patient Spontanous Breathing  Post-op Assessment: Report given to RN  Post vital signs: stable  Last Vitals:  Vitals Value Taken Time  BP    Temp    Pulse    Resp    SpO2      Last Pain:  Vitals:   06/03/20 0850  TempSrc: Temporal  PainSc: 6          Complications: No complications documented.

## 2020-06-03 NOTE — Op Note (Signed)
[facility] Patient Name: Brian Nielsen Procedure Date: 06/03/2020 9:22 AM MRN: [id number] Account #: [id number] Date of Birth: 03-Oct-1961 Admit Type: Outpatient Age: 59 Room: [facility] ENDO ROOM 3 Gender: Male Note Status: Finalized Procedure:             Upper GI endoscopy Indications:           Dyspepsia, Follow-up of celiac disease Providers:             Andrey Farmer MD, MD Referring MD:          Dion Body (Referring MD) Medicines:             Monitored Anesthesia Care Complications:         No immediate complications. Procedure:             Pre-Anesthesia Assessment:                        - Prior to the procedure, a History and Physical was                         performed, and patient medications and allergies were                         reviewed. The patient is competent. The risks and                         benefits of the procedure and the sedation options and                         risks were discussed with the patient. All questions                         were answered and informed consent was obtained.                         Patient identification and proposed procedure were                         verified by the physician, the nurse, the anesthetist                         and the technician in the endoscopy suite. Mental                         Status Examination: alert and oriented. Airway                         Examination: normal oropharyngeal airway and neck                         mobility. Respiratory Examination: clear to                         auscultation. CV Examination: normal. Prophylactic                         Antibiotics: The patient does not require prophylactic  antibiotics. Prior Anticoagulants: The patient has                         taken no previous anticoagulant or antiplatelet                         agents. ASA Grade Assessment: II - A patient with mild                          systemic disease. After reviewing the risks and                         benefits, the patient was deemed in satisfactory                         condition to undergo the procedure. The anesthesia                         plan was to use monitored anesthesia care (MAC).                         Immediately prior to administration of medications,                         the patient was re-assessed for adequacy to receive                         sedatives. The heart rate, respiratory rate, oxygen                         saturations, blood pressure, adequacy of pulmonary                         ventilation, and response to care were monitored                         throughout the procedure. The physical status of the                         patient was re-assessed after the procedure.                        After obtaining informed consent, the endoscope was                         passed under direct vision. Throughout the procedure,                         the patient's blood pressure, pulse, and oxygen                         saturations were monitored continuously. The Endoscope                         was introduced through the mouth, with the intention                         of advancing to the duodenum. The scope was advanced  to the gastric fundus before the procedure was                         aborted. Medications were given. The patient tolerated                         the procedure well. The procedure was aborted due to                         presence of food. Findings:      The examined esophagus was normal.      A large amount of food (residue) was found in the gastric fundus. Impression:            - The procedure was aborted due to presence of food.                        - Normal esophagus.                        - A large amount of food (residue) in the stomach.                        - No specimens collected. Recommendation:        - Discharge  patient to home.                        - Resume previous diet.                        - Repeat upper endoscopy at the next available                         appointment. He will need a full day of clears the day                         before the procedure.                        - Return to referring physician as previously                         scheduled. Procedure Code(s):     --- Professional ---                        [phone], 52, Esophagogastroduodenoscopy, flexible,                         transoral; diagnostic, including collection of                         specimen(s) by brushing or washing, when performed                         (separate procedure) Diagnosis Code(s):     --- Professional ---                        R10.13, Epigastric pain  K90.0, Celiac disease CPT copyright 2019 [employer]. All rights reserved. The codes documented in this report are preliminary and upon coder review may  be revised to meet current compliance requirements. Andrey Farmer, MD Andrey Farmer MD, MD 06/03/2020 9:48:46 AM Number of Addenda: 0 Note Initiated On: 06/03/2020 9:22 AM Estimated Blood Loss:  Estimated blood loss: none.      [facility]

## 2020-06-03 NOTE — H&P (Signed)
Outpatient short stay form Pre-procedure 06/03/2020 9:17 AM Brian Nielsen, M.D.  Primary Physician: Dr. Netty Starring  Reason for visit:  Hx of celiac disease with worsening symptoms.  History of present illness:  59 y/o gentleman with history of celiac sprue with new dyspepsia. No blood thinners. Plan for EGD today.    Current Facility-Administered Medications:  .  0.9 %  sodium chloride infusion, , Intravenous, Continuous, Jillaine Waren, Hilton Cork, MD, Last Rate: 20 mL/hr at 06/03/20 0913, New Bag at 06/03/20 0913  Medications Prior to Admission  Medication Sig Dispense Refill Last Dose  . amitriptyline (ELAVIL) 25 MG tablet Take 1 tablet (25 mg total) by mouth at bedtime. 30 tablet 3 06/02/2020 at Unknown time  . Ascorbic Acid (VITAMIN C) 1000 MG tablet Take 1,000 mg by mouth daily.   06/02/2020 at Unknown time  . aspirin 81 MG EC tablet Take 81 mg by mouth daily.   06/02/2020 at Unknown time  . atorvastatin (LIPITOR) 40 MG tablet Take 1 tablet (40 mg total) by mouth daily. 90 tablet 3 06/02/2020 at Unknown time  . Cholecalciferol (VITAMIN D3) 125 MCG (5000 UT) CAPS Take by mouth.   06/02/2020 at Unknown time  . gabapentin (NEURONTIN) 300 MG capsule Take 3 capsules (900 mg three times daily, max 10 capsules daily   06/02/2020 at Unknown time  . glipiZIDE (GLUCOTROL) 5 MG tablet Take 1 tablet (5 mg total) by mouth 2 (two) times daily before a meal. 60 tablet 0 06/02/2020 at Unknown time  . ibuprofen (ADVIL) 200 MG tablet Take 200 mg by mouth every 6 (six) hours as needed.   06/02/2020 at Unknown time  . JARDIANCE 25 MG TABS tablet Take 25 mg by mouth daily.   06/02/2020 at Unknown time  . lansoprazole (PREVACID) 15 MG capsule Take 15 mg by mouth daily at 12 noon.   06/02/2020 at Unknown time  . lisinopril (ZESTRIL) 10 MG tablet Take 10 mg by mouth daily.   06/02/2020 at Unknown time  . metFORMIN (GLUCOPHAGE) 500 MG tablet Take 2 tablets (1,000 mg total) by mouth 2 (two) times daily with a meal. 120  tablet 2 06/02/2020 at Unknown time  . pantoprazole (PROTONIX) 40 MG tablet Take 40 mg by mouth daily.   06/02/2020 at Unknown time  . tamsulosin (FLOMAX) 0.4 MG CAPS capsule Take 1 capsule (0.4 mg total) by mouth daily after supper. 30 capsule 3 06/02/2020 at Unknown time     Allergies  Allergen Reactions  . Duloxetine Other (See Comments)    "disoriented, confused, couldn't drive"  . Pregabalin Nausea Only     Past Medical History:  Diagnosis Date  . Anemia   . BPH (benign prostatic hyperplasia)   . DM (diabetes mellitus) ([place])   . GERD (gastroesophageal reflux disease)   . HTN (hypertension)   . Hyperlipidemia   . Thoracic spondylosis without myelopathy 11/22/2016    Review of systems:  Otherwise negative.    Physical Exam  Gen: Alert, oriented. Appears stated age.  HEENT: PERRLA. Lungs: No respiratory distress Abd: soft, benign, no masses Ext: No edema    Planned procedures: Proceed with EGD. The patient understands the nature of the planned procedure, indications, risks, alternatives and potential complications including but not limited to bleeding, infection, perforation, damage to internal organs and possible oversedation/side effects from anesthesia. The patient agrees and gives consent to proceed.  Please refer to procedure notes for findings, recommendations and patient disposition/instructions.     Raylene Miyamoto MD, MPH.  Gastroenterology 06/03/2020  9:17 AM

## 2020-06-04 ENCOUNTER — Encounter: Payer: Self-pay | Admitting: Gastroenterology

## 2020-06-27 DIAGNOSIS — Z862 Personal history of diseases of the blood and blood-forming organs and certain disorders involving the immune mechanism: Secondary | ICD-10-CM | POA: Insufficient documentation

## 2020-10-17 DIAGNOSIS — N183 Chronic kidney disease, stage 3 unspecified: Secondary | ICD-10-CM | POA: Insufficient documentation

## 2020-11-24 ENCOUNTER — Other Ambulatory Visit: Payer: Self-pay | Admitting: Gastroenterology

## 2020-11-24 DIAGNOSIS — K900 Celiac disease: Secondary | ICD-10-CM

## 2020-11-24 DIAGNOSIS — Z8639 Personal history of other endocrine, nutritional and metabolic disease: Secondary | ICD-10-CM

## 2020-11-24 DIAGNOSIS — R143 Flatulence: Secondary | ICD-10-CM

## 2020-11-24 DIAGNOSIS — R6881 Early satiety: Secondary | ICD-10-CM

## 2020-12-02 ENCOUNTER — Ambulatory Visit: Payer: 59 | Admitting: Urology

## 2021-01-20 ENCOUNTER — Ambulatory Visit: Payer: 59 | Admitting: Urology

## 2021-01-30 ENCOUNTER — Ambulatory Visit: Payer: 59

## 2021-02-24 ENCOUNTER — Other Ambulatory Visit: Payer: Self-pay

## 2021-03-20 ENCOUNTER — Other Ambulatory Visit (HOSPITAL_COMMUNITY)
Admission: RE | Admit: 2021-03-20 | Discharge: 2021-03-20 | Disposition: A | Discharge status: Home / Self Care | Payer: Managed Care, Other (non HMO) | Source: Ambulatory Visit | Attending: Ophthalmology | Admitting: Ophthalmology

## 2021-03-20 ENCOUNTER — Encounter (HOSPITAL_COMMUNITY): Payer: Self-pay | Admitting: Ophthalmology

## 2021-03-20 ENCOUNTER — Ambulatory Visit: Payer: Self-pay | Admitting: Ophthalmology

## 2021-03-20 ENCOUNTER — Other Ambulatory Visit: Payer: Self-pay

## 2021-03-20 DIAGNOSIS — Z20822 Contact with and (suspected) exposure to covid-19: Secondary | ICD-10-CM | POA: Diagnosis not present

## 2021-03-20 DIAGNOSIS — Z01812 Encounter for preprocedural laboratory examination: Secondary | ICD-10-CM | POA: Insufficient documentation

## 2021-03-20 LAB — SARS CORONAVIRUS 2 (TAT 6-24 HRS): SARS Coronavirus 2: NEGATIVE

## 2021-03-20 NOTE — Progress Notes (Signed)
PCP - Dr. Netty Starring  Cardiologist - Denies  Chest x-ray - Denies  EKG - DOS-03/21/21  Stress Test - Denies  ECHO - Denies  Cardiac Cath - Denies  AICD-na PM-na LOOP-na  Sleep Study - Denies CPAP - Denies  LABS- 03/21/21:CBC, BMP 03/20/21: COVID-pending  ASA- LD- 5/6  ERAS- No  HA1C- 10/02/21- 5.9 (E) Fasting Blood Sugar - 130-160 Checks Blood Sugar ___2__ times a day  Anesthesia- No  Pt denies having chest pain, sob, or fever during the pre-op phone call. All instructions explained to the pt and spouse, with a verbal understanding of the material including: as of today, stop taking all Aspirin (unless instructed by your doctor) and Other Aspirin containing products, Vitamins, Fish oils, and Herbal medications. Also stop all NSAIDS i.e. Advil, Ibuprofen, Motrin, Aleve, Anaprox, Naproxen, BC, Goody Powders, and all Supplements.  . Do not take Glipizide, Jardiance, and Metformin the morning of surgery.  How do I manage my blood sugar before surgery? o Check your blood sugar the morning of your surgery when you wake up and every 2 hours until you get to the Short Stay unit. . If your blood sugar is less than 70 mg/dL, you will need to treat for low blood sugar: o Do not take insulin. o Treat a low blood sugar (less than 70 mg/dL) with  cup of clear juice (cranberry or apple), 4 glucose tablets, OR glucose gel. Recheck blood sugar in 15 minutes after treatment (to make sure it is greater than 70 mg/dL). If your blood sugar is not greater than 70 mg/dL on recheck, call [phone] o  for further instructions.                     . If your CBG is greater than 220 mg/dL,inform the staff upon arrival to Short Stay.  . If you are admitted to the hospital after surgery: o Your blood sugar will be checked by the staff and you will probably be given insulin after surgery (instead of oral diabetes medicines) to make sure you have good blood sugar levels. o The goal for blood sugar  control after surgery is 80-180 mg/dL.  Reviewed and Endorsed by [facility] Patient Education Committee, August 2015   Pt also instructed to self quarantine after being tested for COVID-19. The opportunity to ask questions was provided.   Coronavirus Screening  Have you experienced the following symptoms:  Cough yes/no: No Fever (>100.38F)  yes/no: No Runny nose yes/no: No Sore throat yes/no: No Difficulty breathing/shortness of breath  yes/no: No  Have you or a family member traveled in the last 14 days and where? yes/no: No   If the patient indicates "YES" to the above questions, their PAT will be rescheduled to limit the exposure to others and, the surgeon will be notified. THE PATIENT WILL NEED TO BE ASYMPTOMATIC FOR 14 DAYS.   If the patient is not experiencing any of these symptoms, the PAT nurse will instruct them to NOT bring anyone with them to their appointment since they may have these symptoms or traveled as well.   Please remind your patients and families that hospital visitation restrictions are in effect and the importance of the restrictions.

## 2021-03-20 NOTE — H&P (Signed)
Date of examination:  03/21/21  Indication for surgery: endophthalmitis right eye  Pertinent past medical history:  Past Medical History:  Diagnosis Date  . Adult celiac disease   . Anemia   . BPH (benign prostatic hyperplasia)   . DM (diabetes mellitus) ([place])    Type II  . GERD (gastroesophageal reflux disease)   . HTN (hypertension)   . Hyperlipidemia   . Thoracic spondylosis without myelopathy 11/22/2016    Pertinent ocular history:  none  Pertinent family history:  Family History  Problem Relation Age of Onset  . Heart disease Father   . Diabetes Father   . Hypertension Father   . Hyperlipidemia Father   . Aneurysm Mother        brain  . Hypertension Mother   . Cancer Maternal Grandmother        melanoma  . Heart disease Paternal Grandfather   . Colon cancer Neg Hx     General:  Healthy appearing patient in no distress.    Eyes:    Acuity OD 20/50  External: Within normal limits     Anterior segment: Within normal limits      Fundus: vitreous opacities    Impression: Endophthalmitis right eye  Plan: Vitrectomy, tap for culture and injection of intravitreal antibiotics  Jalene Mullet, MD

## 2021-03-20 NOTE — H&P (Deleted)
  The note originally documented on this encounter has been moved the the encounter in which it belongs.  

## 2021-03-21 ENCOUNTER — Encounter (HOSPITAL_COMMUNITY)
Admission: RE | Disposition: A | Discharge status: Home / Self Care | Payer: Self-pay | Source: Home / Self Care | Attending: Ophthalmology

## 2021-03-21 ENCOUNTER — Other Ambulatory Visit: Payer: Self-pay

## 2021-03-21 ENCOUNTER — Ambulatory Visit (HOSPITAL_COMMUNITY): Payer: Managed Care, Other (non HMO) | Admitting: Anesthesiology

## 2021-03-21 ENCOUNTER — Encounter (HOSPITAL_COMMUNITY): Payer: Self-pay | Admitting: Ophthalmology

## 2021-03-21 ENCOUNTER — Ambulatory Visit (HOSPITAL_COMMUNITY)
Admission: RE | Admit: 2021-03-21 | Discharge: 2021-03-21 | Disposition: A | Discharge status: Home / Self Care | Payer: Managed Care, Other (non HMO) | Attending: Ophthalmology | Admitting: Ophthalmology

## 2021-03-21 DIAGNOSIS — H44001 Unspecified purulent endophthalmitis, right eye: Secondary | ICD-10-CM | POA: Diagnosis not present

## 2021-03-21 HISTORY — DX: Celiac disease: K90.0

## 2021-03-21 HISTORY — PX: PARS PLANA VITRECTOMY: SHX2166

## 2021-03-21 LAB — CBC
HCT: 31.8 % — ABNORMAL LOW (ref 39.0–52.0)
Hemoglobin: 10.3 g/dL — ABNORMAL LOW (ref 13.0–17.0)
MCH: 29.6 pg (ref 26.0–34.0)
MCHC: 32.4 g/dL (ref 30.0–36.0)
MCV: 91.4 fL (ref 80.0–100.0)
Platelets: 157 10*3/uL (ref 150–400)
RBC: 3.48 MIL/uL — ABNORMAL LOW (ref 4.22–5.81)
RDW: 14 % (ref 11.5–15.5)
WBC: 5 10*3/uL (ref 4.0–10.5)
nRBC: 0 % (ref 0.0–0.2)

## 2021-03-21 LAB — BASIC METABOLIC PANEL
Anion gap: 6 (ref 5–15)
BUN: 23 mg/dL — ABNORMAL HIGH (ref 6–20)
CO2: 25 mmol/L (ref 22–32)
Calcium: 8.9 mg/dL (ref 8.9–10.3)
Chloride: 113 mmol/L — ABNORMAL HIGH (ref 98–111)
Creatinine, Ser: 1.51 mg/dL — ABNORMAL HIGH (ref 0.61–1.24)
GFR, Estimated: 53 mL/min — ABNORMAL LOW (ref >60)
Glucose, Bld: 171 mg/dL — ABNORMAL HIGH (ref 70–99)
Potassium: 3.9 mmol/L (ref 3.5–5.1)
Sodium: 144 mmol/L (ref 135–145)

## 2021-03-21 LAB — GLUCOSE, CAPILLARY
Glucose-Capillary: 154 mg/dL — ABNORMAL HIGH (ref 70–99)
Glucose-Capillary: 174 mg/dL — ABNORMAL HIGH (ref 70–99)
Glucose-Capillary: 106 mg/dL — ABNORMAL HIGH (ref 70–99)

## 2021-03-21 SURGERY — PARS PLANA VITRECTOMY 25 GAUGE FOR ENDOPHTHALMITIS
Anesthesia: Monitor Anesthesia Care | Laterality: Right

## 2021-03-21 MED ORDER — HYDRALAZINE HCL 20 MG/ML IJ SOLN
5.0000 mg | Freq: Once | INTRAMUSCULAR | Status: AC
  Administered 2021-03-21: 5 mg via INTRAVENOUS

## 2021-03-21 MED ORDER — HYPROMELLOSE (GONIOSCOPIC) 2.5 % OP SOLN
OPHTHALMIC | Status: AC
  Filled 2021-03-21: qty 15

## 2021-03-21 MED ORDER — OFLOXACIN 0.3 % OP SOLN
1.0000 [drp] | OPHTHALMIC | Status: AC | PRN
  Administered 2021-03-21 (×3): 1 [drp] via OPHTHALMIC
  Filled 2021-03-21: qty 5

## 2021-03-21 MED ORDER — DEXAMETHASONE SODIUM PHOSPHATE 10 MG/ML IJ SOLN
INTRAMUSCULAR | Status: AC
  Filled 2021-03-21: qty 1

## 2021-03-21 MED ORDER — MIDAZOLAM HCL 5 MG/5ML IJ SOLN
INTRAMUSCULAR | Status: DC | PRN
  Administered 2021-03-21: 2 mg via INTRAVENOUS

## 2021-03-21 MED ORDER — ACETAMINOPHEN 10 MG/ML IV SOLN: 1000.0000 mg | Freq: Once | INTRAVENOUS | Status: DC | PRN

## 2021-03-21 MED ORDER — SODIUM CHLORIDE 0.9 % IV SOLN: INTRAVENOUS | Status: DC

## 2021-03-21 MED ORDER — LIDOCAINE HCL 2 % IJ SOLN
INTRAMUSCULAR | Status: DC | PRN
  Administered 2021-03-21: 5.5 mL via RETROBULBAR

## 2021-03-21 MED ORDER — EPINEPHRINE PF 1 MG/ML IJ SOLN
INTRAMUSCULAR | Status: AC
  Filled 2021-03-21: qty 1

## 2021-03-21 MED ORDER — PROMETHAZINE HCL 25 MG/ML IJ SOLN: 6.2500 mg | INTRAMUSCULAR | Status: DC | PRN

## 2021-03-21 MED ORDER — FENTANYL CITRATE (PF) 100 MCG/2ML IJ SOLN: 25.0000 ug | INTRAMUSCULAR | Status: DC | PRN

## 2021-03-21 MED ORDER — PROPOFOL 10 MG/ML IV BOLUS
INTRAVENOUS | Status: AC
  Filled 2021-03-21: qty 20

## 2021-03-21 MED ORDER — ACETAMINOPHEN 325 MG PO TABS: 325.0000 mg | ORAL_TABLET | ORAL | Status: DC | PRN

## 2021-03-21 MED ORDER — BUPIVACAINE HCL (PF) 0.75 % IJ SOLN
INTRAMUSCULAR | Status: AC
  Filled 2021-03-21: qty 10

## 2021-03-21 MED ORDER — DEXAMETHASONE SODIUM PHOSPHATE 10 MG/ML IJ SOLN
INTRAMUSCULAR | Status: DC | PRN
  Administered 2021-03-21: 10 mg via INTRAVENOUS

## 2021-03-21 MED ORDER — ONDANSETRON HCL 4 MG/2ML IJ SOLN
INTRAMUSCULAR | Status: DC | PRN
  Administered 2021-03-21: 4 mg via INTRAVENOUS

## 2021-03-21 MED ORDER — PROPOFOL 500 MG/50ML IV EMUL
INTRAVENOUS | Status: DC | PRN
  Administered 2021-03-21: 20 mg via INTRAVENOUS
  Administered 2021-03-21: 50 mg via INTRAVENOUS

## 2021-03-21 MED ORDER — FENTANYL CITRATE (PF) 250 MCG/5ML IJ SOLN
INTRAMUSCULAR | Status: AC
  Filled 2021-03-21: qty 5

## 2021-03-21 MED ORDER — ORAL CARE MOUTH RINSE: 15.0000 mL | Freq: Once | OROMUCOSAL | Status: AC

## 2021-03-21 MED ORDER — BSS IO SOLN
INTRAOCULAR | Status: DC | PRN
  Administered 2021-03-21: 15 mL

## 2021-03-21 MED ORDER — TRIAMCINOLONE ACETONIDE 40 MG/ML IJ SUSP
INTRAMUSCULAR | Status: DC | PRN
  Administered 2021-03-21: 40 mg

## 2021-03-21 MED ORDER — LIDOCAINE 2% (20 MG/ML) 5 ML SYRINGE
INTRAMUSCULAR | Status: AC
  Filled 2021-03-21: qty 5

## 2021-03-21 MED ORDER — BSS PLUS IO SOLN
INTRAOCULAR | Status: AC
  Filled 2021-03-21: qty 500

## 2021-03-21 MED ORDER — OXYCODONE HCL 5 MG/5ML PO SOLN: 5.0000 mg | Freq: Once | ORAL | Status: DC | PRN

## 2021-03-21 MED ORDER — BSS PLUS IO SOLN
INTRAOCULAR | Status: DC | PRN
  Administered 2021-03-21: 1 via INTRAOCULAR

## 2021-03-21 MED ORDER — HYDRALAZINE HCL 20 MG/ML IJ SOLN
INTRAMUSCULAR | Status: AC
  Filled 2021-03-21: qty 1

## 2021-03-21 MED ORDER — HYPROMELLOSE (GONIOSCOPIC) 2.5 % OP SOLN
OPHTHALMIC | Status: DC | PRN
  Administered 2021-03-21: 1 [drp] via OPHTHALMIC

## 2021-03-21 MED ORDER — LIDOCAINE HCL 2 % IJ SOLN
INTRAMUSCULAR | Status: AC
  Filled 2021-03-21: qty 20

## 2021-03-21 MED ORDER — ATROPINE SULFATE 1 % OP SOLN
OPHTHALMIC | Status: AC
  Filled 2021-03-21: qty 5

## 2021-03-21 MED ORDER — FENTANYL CITRATE (PF) 100 MCG/2ML IJ SOLN: INTRAMUSCULAR | Status: DC | PRN

## 2021-03-21 MED ORDER — ACETAMINOPHEN 160 MG/5ML PO SOLN
325.0000 mg | ORAL | Status: DC | PRN
Start: 2021-03-21 — End: 2021-03-21

## 2021-03-21 MED ORDER — TOBRAMYCIN-DEXAMETHASONE 0.3-0.1 % OP OINT
TOPICAL_OINTMENT | OPHTHALMIC | Status: AC
  Filled 2021-03-21: qty 3.5

## 2021-03-21 MED ORDER — LIDOCAINE 2% (20 MG/ML) 5 ML SYRINGE
INTRAMUSCULAR | Status: DC | PRN
  Administered 2021-03-21: 40 mg via INTRAVENOUS

## 2021-03-21 MED ORDER — HYALURONIDASE HUMAN 150 UNIT/ML IJ SOLN
INTRAMUSCULAR | Status: AC
  Filled 2021-03-21: qty 1

## 2021-03-21 MED ORDER — CYCLOPENTOLATE HCL 1 % OP SOLN
1.0000 [drp] | OPHTHALMIC | Status: AC | PRN
  Administered 2021-03-21 (×3): 1 [drp] via OPHTHALMIC
  Filled 2021-03-21: qty 2

## 2021-03-21 MED ORDER — EPINEPHRINE PF 1 MG/ML IJ SOLN: INTRAOCULAR | Status: DC | PRN

## 2021-03-21 MED ORDER — OXYCODONE HCL 5 MG PO TABS: 5.0000 mg | ORAL_TABLET | Freq: Once | ORAL | Status: DC | PRN

## 2021-03-21 MED ORDER — BSS IO SOLN
INTRAOCULAR | Status: AC
  Filled 2021-03-21: qty 15

## 2021-03-21 MED ORDER — TRIAMCINOLONE ACETONIDE 40 MG/ML IJ SUSP
INTRAMUSCULAR | Status: AC
  Filled 2021-03-21: qty 5

## 2021-03-21 MED ORDER — CEFTAZIDIME INTRAVITREAL INJECTION 2.25 MG/0.1 ML
2.2500 mg | INTRAVITREAL | Status: DC
  Filled 2021-03-21: qty 0.1

## 2021-03-21 MED ORDER — PHENYLEPHRINE HCL 2.5 % OP SOLN
1.0000 [drp] | OPHTHALMIC | Status: AC | PRN
  Administered 2021-03-21 (×3): 1 [drp] via OPHTHALMIC
  Filled 2021-03-21: qty 2

## 2021-03-21 MED ORDER — VANCOMYCIN INTRAVITREAL INJECTION 1 MG/0.1 ML
INTRAOCULAR | Status: DC | PRN
  Administered 2021-03-21: 1 mg via INTRAVITREAL

## 2021-03-21 MED ORDER — AMISULPRIDE (ANTIEMETIC) 5 MG/2ML IV SOLN
10.0000 mg | Freq: Once | INTRAVENOUS | Status: DC | PRN
Start: 2021-03-21 — End: 2021-03-21

## 2021-03-21 MED ORDER — CHLORHEXIDINE GLUCONATE 0.12 % MT SOLN
15.0000 mL | Freq: Once | OROMUCOSAL | Status: AC
  Administered 2021-03-21: 15 mL via OROMUCOSAL
  Filled 2021-03-21: qty 15

## 2021-03-21 MED ORDER — CEFTAZIDIME INTRAVITREAL INJECTION 2.25 MG/0.1 ML
INTRAVITREAL | Status: DC | PRN
  Administered 2021-03-21: 2.25 mg via INTRAVITREAL

## 2021-03-21 MED ORDER — MIDAZOLAM HCL 2 MG/2ML IJ SOLN
INTRAMUSCULAR | Status: AC
  Filled 2021-03-21: qty 2

## 2021-03-21 MED ORDER — TOBRAMYCIN-DEXAMETHASONE 0.3-0.1 % OP OINT
TOPICAL_OINTMENT | OPHTHALMIC | Status: DC | PRN
  Administered 2021-03-21: 1 via OPHTHALMIC

## 2021-03-21 MED ORDER — VANCOMYCIN INTRAVITREAL INJECTION 1 MG/0.1 ML
1.0000 mg | INTRAOCULAR | Status: DC
  Filled 2021-03-21 (×2): qty 0.1

## 2021-03-21 SURGICAL SUPPLY — 45 items
APPLICATOR COTTON TIP 6 STRL (MISCELLANEOUS) ×3 IMPLANT
APPLICATOR COTTON TIP 6IN STRL (MISCELLANEOUS) ×6
BNDG EYE OVAL (GAUZE/BANDAGES/DRESSINGS) ×2 IMPLANT
CANNULA ANT CHAM MAIN (OPHTHALMIC RELATED) IMPLANT
CANNULA VLV SOFT TIP 25GA (OPHTHALMIC) ×2 IMPLANT
CAUTERY EYE LOW TEMP 1300F FIN (OPHTHALMIC RELATED) IMPLANT
CLSR STERI-STRIP ANTIMIC 1/2X4 (GAUZE/BANDAGES/DRESSINGS) ×2 IMPLANT
COVER MAYO STAND STRL (DRAPES) ×2 IMPLANT
DRAPE HALF SHEET 40X57 (DRAPES) ×2 IMPLANT
DRAPE INCISE 51X51 W/FILM STRL (DRAPES) ×2 IMPLANT
DRAPE RETRACTOR (MISCELLANEOUS) ×2 IMPLANT
FORCEPS GRIESHABER ILM 25G A (INSTRUMENTS) IMPLANT
GLOVE SURG SYN 7.5  E (GLOVE) ×4
GLOVE SURG SYN 7.5 E (GLOVE) ×2 IMPLANT
GOWN STRL REUS W/ TWL LRG LVL3 (GOWN DISPOSABLE) ×1 IMPLANT
GOWN STRL REUS W/TWL LRG LVL3 (GOWN DISPOSABLE) ×2
KIT BASIN OR (CUSTOM PROCEDURE TRAY) ×2 IMPLANT
KIT TURNOVER KIT B (KITS) ×2 IMPLANT
LENS BIOM SUPER VIEW SET DISP (MISCELLANEOUS) ×2 IMPLANT
MICROPICK 25G (MISCELLANEOUS)
NEEDLE 18GX1X1/2 (RX/OR ONLY) (NEEDLE) ×2 IMPLANT
NEEDLE 25GX 5/8IN NON SAFETY (NEEDLE) ×2 IMPLANT
NEEDLE 27GX1/2 REG BEVEL ECLIP (NEEDLE) ×2 IMPLANT
NEEDLE FILTER BLUNT 18X 1/2SAF (NEEDLE) ×3
NEEDLE FILTER BLUNT 18X1 1/2 (NEEDLE) ×3 IMPLANT
NEEDLE HYPO 30X.5 LL (NEEDLE) ×8 IMPLANT
NEEDLE RETROBULBAR 25GX1.5 (NEEDLE) ×2 IMPLANT
NS IRRIG 1000ML POUR BTL (IV SOLUTION) ×2 IMPLANT
PACK VITRECTOMY CUSTOM (CUSTOM PROCEDURE TRAY) ×2 IMPLANT
PAD ARMBOARD 7.5X6 YLW CONV (MISCELLANEOUS) ×4 IMPLANT
PAK PIK VITRECTOMY CVS 25GA (OPHTHALMIC) ×2 IMPLANT
PICK MICROPICK 25G (MISCELLANEOUS) IMPLANT
PROBE ENDO DIATHERMY 25G (MISCELLANEOUS) IMPLANT
PROBE LASER ILLUM FLEX CVD 25G (OPHTHALMIC) ×2 IMPLANT
SHIELD EYE LENSE ONLY DISP (GAUZE/BANDAGES/DRESSINGS) ×2 IMPLANT
SOL ANTI FOG 6CC (MISCELLANEOUS) ×1 IMPLANT
SOLUTION ANTI FOG 6CC (MISCELLANEOUS) ×1
STOPCOCK 3 WAY HIGH PRESSURE (MISCELLANEOUS) ×4
STOPCOCK 3WAY HIGH PRESSURE (MISCELLANEOUS) ×2 IMPLANT
SYR 10ML LL (SYRINGE) IMPLANT
SYR 20ML LL LF (SYRINGE) ×2 IMPLANT
SYR 5ML LL (SYRINGE) ×2 IMPLANT
SYR TB 1ML LUER SLIP (SYRINGE) ×6 IMPLANT
TOWEL GREEN STERILE FF (TOWEL DISPOSABLE) ×2 IMPLANT
WATER STERILE IRR 1000ML POUR (IV SOLUTION) ×2 IMPLANT

## 2021-03-21 NOTE — Transfer of Care (Signed)
Immediate Anesthesia Transfer of Care Note  Patient: Brian Nielsen  Procedure(s) Performed: PARS PLANA VITRECTOMY 25 GAUGE WITH INJECTION OF ANTIBIOTICS FOR ENDOPHTHALMITIS (Right )  Patient Location: PACU  Anesthesia Type:MAC  Level of Consciousness: awake, alert  and oriented  Airway & Oxygen Therapy: Patient Spontanous Breathing and Patient connected to nasal cannula oxygen  Post-op Assessment: Report given to RN and Post -op Vital signs reviewed and stable  Post vital signs: Reviewed and stable  Last Vitals:  Vitals Value Taken Time  BP 203/82 03/21/21 1249  Temp    Pulse 155 03/21/21 1248  Resp 11 03/21/21 1249  SpO2 100 % 03/21/21 1248  Vitals shown include unvalidated device data.  Last Pain:  Vitals:   03/21/21 0816  TempSrc: Oral  PainSc: 0-No pain      Patients Stated Pain Goal: 3 (91/50/41 3643)  Complications: No complications documented.

## 2021-03-21 NOTE — Anesthesia Preprocedure Evaluation (Signed)
Anesthesia Evaluation  Patient identified by MRN, date of birth, ID band Patient awake    Reviewed: Allergy & Precautions, NPO status , Patient's Chart, lab work & pertinent test results  Airway Mallampati: I  TM Distance: >3 FB Neck ROM: Full    Dental  (+) Teeth Intact, Dental Advisory Given   Pulmonary neg pulmonary ROS,    Pulmonary exam normal        Cardiovascular hypertension, Pt. on medications  Rhythm:Regular Rate:Normal     Neuro/Psych negative neurological ROS  negative psych ROS   GI/Hepatic Neg liver ROS, GERD  Medicated,  Endo/Other  diabetes, Type 2, Oral Hypoglycemic Agents  Renal/GU negative Renal ROS  negative genitourinary   Musculoskeletal  (+) Arthritis ,   Abdominal Normal abdominal exam  (+)   Peds negative pediatric ROS (+)  Hematology   Anesthesia Other Findings - HLD  Reproductive/Obstetrics                             Anesthesia Physical Anesthesia Plan  ASA: II  Anesthesia Plan: MAC   Post-op Pain Management:    Induction: Intravenous  PONV Risk Score and Plan: Propofol infusion, Ondansetron and Midazolam  Airway Management Planned: Natural Airway and Nasal Cannula  Additional Equipment: None  Intra-op Plan:   Post-operative Plan:   Informed Consent: I have reviewed the patients History and Physical, chart, labs and discussed the procedure including the risks, benefits and alternatives for the proposed anesthesia with the patient or authorized representative who has indicated his/her understanding and acceptance.       Plan Discussed with: CRNA  Anesthesia Plan Comments:         Anesthesia Quick Evaluation

## 2021-03-21 NOTE — Brief Op Note (Signed)
03/21/2021  12:43 PM  PATIENT:  Brian Nielsen  60 y.o. male  PRE-OPERATIVE DIAGNOSIS:  Endophthalmitis right eye  POST-OPERATIVE DIAGNOSIS:  Endophthalmitis right eye  PROCEDURE:  Procedure(s): PARS PLANA VITRECTOMY 25 GAUGE WITH INJECTION OF ANTIBIOTICS FOR ENDOPHTHALMITIS (Right), Endolaser (Right)  SURGEON:  Surgeon(s) and Role:    * Jalene Mullet, MD - Primary  PHYSICIAN ASSISTANT:   ASSISTANTS: none   ANESTHESIA:   local and MAC  EBL:  minimal   BLOOD ADMINISTERED:none  DRAINS: none   LOCAL MEDICATIONS USED:  MARCAINE    and LIDOCAINE   SPECIMEN:  Source of Specimen:  Vitreous  DISPOSITION OF SPECIMEN:  Gram Stain and Culture  COUNTS:  YES  TOURNIQUET:  * No tourniquets in log *  DICTATION: .Note written in EPIC  PLAN OF CARE: Discharge to home after PACU  PATIENT DISPOSITION:  PACU - hemodynamically stable.   Delay start of Pharmacological VTE agent (>24hrs) due to surgical blood loss or risk of bleeding: not applicable

## 2021-03-21 NOTE — Anesthesia Procedure Notes (Signed)
Procedure Name: MAC Date/Time: 03/21/2021 11:54 AM Performed by: Kyung Rudd, CRNA Pre-anesthesia Checklist: Patient identified, Emergency Drugs available, Suction available and Patient being monitored Patient Re-evaluated:Patient Re-evaluated prior to induction Oxygen Delivery Method: Nasal cannula Preoxygenation: Pre-oxygenation with 100% oxygen Induction Type: IV induction Placement Confirmation: positive ETCO2 Dental Injury: Teeth and Oropharynx as per pre-operative assessment

## 2021-03-21 NOTE — Anesthesia Postprocedure Evaluation (Signed)
Anesthesia Post Note  Patient: Kathleene Hazel Lupercio  Procedure(s) Performed: PARS PLANA VITRECTOMY 25 GAUGE WITH INJECTION OF ANTIBIOTICS FOR ENDOPHTHALMITIS (Right )     Patient location during evaluation: PACU Anesthesia Type: MAC Level of consciousness: awake and alert Pain management: pain level controlled Vital Signs Assessment: post-procedure vital signs reviewed and stable Respiratory status: spontaneous breathing, nonlabored ventilation, respiratory function stable and patient connected to nasal cannula oxygen Cardiovascular status: stable and blood pressure returned to baseline Postop Assessment: no apparent nausea or vomiting Anesthetic complications: no   No complications documented.  Last Vitals:  Vitals:   03/21/21 1335 03/21/21 1348  BP: (!) 153/77 (!) 157/74  Pulse: 66 61  Resp: 16 10  Temp:  36.6 C  SpO2: 100% 100%    Last Pain:  Vitals:   03/21/21 1348  TempSrc:   PainSc: 0-No pain                 Effie Berkshire

## 2021-03-21 NOTE — Discharge Instructions (Addendum)
DO NOT SLEEP ON BACK, THE EYE PRESSURE CAN GO UP AND CAUSE VISION LOSS   SLEEP ON SIDE WITH NOSE TO PILLOW  DURING DAY KEEP UPRIGHT

## 2021-03-21 NOTE — Op Note (Signed)
Brian Nielsen 03/21/2021 Diagnosis: Endophthalmitis right eye  Procedure: [place] Vitrectomy, Endolaser and air/fluid exchange and injection of intravitreal antibiotics right eye Operative Eye:  right eye  Surgeon: Royston Cowper Estimated Blood Loss: minimal Specimens for Pathology:  None Complications: none   The  patient was prepped and draped in the usual fashion for ocular surgery on the  right eye .  A lid speculum was placed.  Infusion line and trocar was placed at the 8 o'clock position approximately 3.5 mm from the surgical limbus.   The infusion line was allowed to run and then clamped when placed at the cannula opening. The line was inserted and secured to the drape with an adhesive strip.   Active trocars/cannula were placed at the 10 and 2 o'clock positions approximately 3.5 mm from the surgical limbus. The cannula was visualized in the vitreous cavity.  The light pipe and vitreous cutter were inserted into the vitreous cavity and a core vitrectomy was performed.  Care taken to remove the vitreous up to the vitreous base for 360 degrees.   Kenalog was used to highlight the posterior hyaloid.  A posterior vitreous detachment was induced.  The remaining central vitreous was removed.    One tear was noted at 11 o'clock.  Endolaser was applied surrounding this break.    A partial air-fluid exchange was performed. 53m in 0.1 ml of Vancomycin and 2.25 mg in 0.1 ml of Ceftazadime was placed in the eye.   The superior cannulas were sequentially removed with concommitant tamponade using a cotton tipped applicator and noted to be air tight.  The infusion line and trocar were removed and the sclerotomy was noted to be air tight with normal intraocular pressure by digital palpapation.  Subconjunctival injections of  2 mg Vancomycin, 4.561mof Ceftazadime, and Dexamethasone 80m31mml were placed in the infero-medial quadrant.   The speculum and drapes were removed and the eye was patched  with Polymixin/Bacitracin ophthalmic ointment. An eye shield was placed and the patient was transferred alert and conversant with stable vital signs to the post operative recovery area.  The patient tolerated the procedure well and no complications were noted.  NarRoyston Cowper

## 2021-03-22 ENCOUNTER — Encounter (HOSPITAL_COMMUNITY): Payer: Self-pay | Admitting: Ophthalmology

## 2021-03-26 LAB — AEROBIC/ANAEROBIC CULTURE W GRAM STAIN (SURGICAL/DEEP WOUND): Culture: NO GROWTH

## 2021-04-21 LAB — FUNGUS CULTURE WITH STAIN

## 2021-04-21 LAB — FUNGAL ORGANISM REFLEX

## 2021-04-21 LAB — FUNGUS CULTURE RESULT

## 2021-04-22 ENCOUNTER — Ambulatory Visit (INDEPENDENT_AMBULATORY_CARE_PROVIDER_SITE_OTHER): Payer: Managed Care, Other (non HMO)

## 2021-04-22 ENCOUNTER — Other Ambulatory Visit: Payer: Self-pay

## 2021-04-22 ENCOUNTER — Ambulatory Visit
Admission: EM | Admit: 2021-04-22 | Discharge: 2021-04-22 | Disposition: A | Discharge status: Home / Self Care | Payer: Managed Care, Other (non HMO) | Attending: Family Medicine | Admitting: Family Medicine

## 2021-04-22 DIAGNOSIS — J22 Unspecified acute lower respiratory infection: Secondary | ICD-10-CM | POA: Diagnosis not present

## 2021-04-22 DIAGNOSIS — R0602 Shortness of breath: Secondary | ICD-10-CM | POA: Diagnosis not present

## 2021-04-22 DIAGNOSIS — R0782 Intercostal pain: Secondary | ICD-10-CM | POA: Diagnosis not present

## 2021-04-22 DIAGNOSIS — R059 Cough, unspecified: Secondary | ICD-10-CM | POA: Diagnosis not present

## 2021-04-22 MED ORDER — AZITHROMYCIN 250 MG PO TABS: 250.0000 mg | ORAL_TABLET | Freq: Every day | ORAL | 0 refills | Status: DC

## 2021-04-22 MED ORDER — PROMETHAZINE-DM 6.25-15 MG/5ML PO SYRP: 5.0000 mL | ORAL_SOLUTION | Freq: Four times a day (QID) | ORAL | 0 refills | Status: DC | PRN

## 2021-04-22 MED ORDER — METHYLPREDNISOLONE SODIUM SUCC 125 MG IJ SOLR
125.0000 mg | Freq: Once | INTRAMUSCULAR | Status: AC
  Administered 2021-04-22: 125 mg via INTRAMUSCULAR

## 2021-04-22 MED ORDER — BENZONATATE 100 MG PO CAPS: 100.0000 mg | ORAL_CAPSULE | Freq: Three times a day (TID) | ORAL | 0 refills | Status: DC

## 2021-04-22 MED ORDER — ALBUTEROL SULFATE HFA 108 (90 BASE) MCG/ACT IN AERS
2.0000 | INHALATION_SPRAY | Freq: Once | RESPIRATORY_TRACT | Status: AC
  Administered 2021-04-22: 2 via RESPIRATORY_TRACT

## 2021-04-22 NOTE — ED Provider Notes (Signed)
[facility]    CSN: [id number] Arrival date & time: 04/22/21  1710      History   Chief Complaint Chief Complaint  Patient presents with   Cough    HPI Brian Nielsen is a 60 y.o. male.   Reports cough, SOB, chest pain with coughing, fatigue, body aches for the last week that have progressively worsened. Has attempted OTC cough and cold with little relief. Denies sick contacts. Has negative hx Covid. Has completed Covid vaccines. Has completed flu vaccine. Denies abdominal pain, nausea, vomiting, diarrhea, rash, fever, there symptoms.  ROS per HPI  The history is provided by the patient.  Cough  Past Medical History:  Diagnosis Date   Adult celiac disease    Anemia    BPH (benign prostatic hyperplasia)    DM (diabetes mellitus) (HCC)    Type II   GERD (gastroesophageal reflux disease)    HTN (hypertension)    Hyperlipidemia    Thoracic spondylosis without myelopathy 11/22/2016    Patient Active Problem List   Diagnosis Date Noted   Thoracic spondylosis without myelopathy 11/22/2016   Vitamin D deficiency 11/22/2016   HLD (hyperlipidemia) 10/19/2016   BPH (benign prostatic hyperplasia) 10/19/2016   Essential hypertension 10/19/2016   Diabetes mellitus, insulin dependent (IDDM), controlled 10/19/2016   Celiac sprue 05/23/2014   Anemia, iron deficiency 04/19/2014   GERD (gastroesophageal reflux disease) 04/19/2014    Past Surgical History:  Procedure Laterality Date   COLONOSCOPY  2003   Dr. Laural Golden: two small polyps, small ext hemorrhoids.path:focal adenomatous changes   COLONOSCOPY N/A 05/03/2014   Procedure: COLONOSCOPY;  Surgeon: Danie Binder, MD;  Location: [facility];  Service: Endoscopy;  Laterality: N/A;  12:00   ESOPHAGOGASTRODUODENOSCOPY N/A 05/03/2014   Procedure: ESOPHAGOGASTRODUODENOSCOPY (EGD);  Surgeon: Danie Binder, MD;  Location: [facility];  Service: Endoscopy;  Laterality: N/A;   ESOPHAGOGASTRODUODENOSCOPY (EGD) WITH PROPOFOL  N/A 06/03/2020   Procedure: ESOPHAGOGASTRODUODENOSCOPY (EGD) WITH PROPOFOL;  Surgeon: [name], MD;  Location: [facility];  Service: Endoscopy;  Laterality: N/A;   PARS PLANA VITRECTOMY Right 03/21/2021   Procedure: PARS PLANA VITRECTOMY 25 GAUGE WITH INJECTION OF ANTIBIOTICS FOR ENDOPHTHALMITIS;  Surgeon: Jalene Mullet, MD;  Location: [place];  Service: Ophthalmology;  Laterality: Right;       Home Medications    Prior to Admission medications   Medication Sig Start Date End Date Taking? Authorizing Provider  azithromycin (ZITHROMAX) 250 MG tablet Take 1 tablet (250 mg total) by mouth daily. Take first 2 tablets together, then 1 every day until finished. 04/22/21  Yes Faustino Congress, NP  benzonatate (TESSALON) 100 MG capsule Take 1 capsule (100 mg total) by mouth every 8 (eight) hours. 04/22/21  Yes Faustino Congress, NP  promethazine-dextromethorphan (PROMETHAZINE-DM) 6.25-15 MG/5ML syrup Take 5 mLs by mouth 4 (four) times daily as needed for cough. 04/22/21  Yes Faustino Congress, NP  amitriptyline (ELAVIL) 25 MG tablet Take 1 tablet (25 mg total) by mouth at bedtime. Patient not taking: No sig reported 12/19/19   Penumalli, Earlean Polka, MD  Ascorbic Acid (VITAMIN C) 1000 MG tablet Take 1,000 mg by mouth daily.    [provider]  aspirin 81 MG EC tablet Take 81 mg by mouth daily.    [provider]  atorvastatin (LIPITOR) 40 MG tablet Take 1 tablet (40 mg total) by mouth daily. Patient taking differently: Take 40 mg by mouth every evening. 10/21/16   Raylene Everts, MD  B Complex  Vitamins (B COMPLEX PO) Take 1 tablet by mouth daily.    [provider]  Cholecalciferol (VITAMIN D3 PO) Take 1 capsule by mouth daily.    [provider]  glipiZIDE (GLUCOTROL) 5 MG tablet Take 1 tablet (5 mg total) by mouth 2 (two) times daily before a meal. Patient taking differently: Take 5 mg by mouth daily before breakfast. 01/12/17   Raylene Everts, MD   JARDIANCE 25 MG TABS tablet Take 25 mg by mouth every evening. 09/08/19   [provider]  LINZESS 145 MCG CAPS capsule Take 145 mcg by mouth daily as needed for constipation. 11/12/20   [provider]  lisinopril (ZESTRIL) 2.5 MG tablet Take 2.5 mg by mouth at bedtime. 11/10/20   [provider]  metFORMIN (GLUCOPHAGE) 500 MG tablet Take 2 tablets (1,000 mg total) by mouth 2 (two) times daily with a meal. Patient not taking: Reported on 03/21/2021 11/22/16   Raylene Everts, MD  Multiple Vitamin (MULTIVITAMIN) tablet Take 1 tablet by mouth daily.    [provider]  OZEMPIC, 1 MG/DOSE, 4 MG/3ML SOPN Inject 1 mg into the skin once a week. 11/03/20   [provider]  pantoprazole (PROTONIX) 40 MG tablet Take 40 mg by mouth every evening. 09/08/19   [provider]  tamsulosin (FLOMAX) 0.4 MG CAPS capsule Take 1 capsule (0.4 mg total) by mouth daily after supper. 10/19/16   Raylene Everts, MD    Family History Family History  Problem Relation Age of Onset   Heart disease Father    Diabetes Father    Hypertension Father    Hyperlipidemia Father    Aneurysm Mother        brain   Hypertension Mother    Cancer Maternal Grandmother        melanoma   Heart disease Paternal Grandfather    Colon cancer Neg Hx     Social History Social History   Tobacco Use   Smoking status: Never   Smokeless tobacco: Current    Types: Snuff   Tobacco comments:    Quit x 30 years  Vaping Use   Vaping Use: Never used  Substance Use Topics   Alcohol use: Not Currently    Comment: occasionally on weekends   Drug use: No     Allergies   Codeine   Review of Systems Review of Systems  Respiratory:  Positive for cough.     Physical Exam Triage Vital Signs ED Triage Vitals  Enc Vitals Group     BP 04/22/21 1725 (!) 192/85     Pulse Rate 04/22/21 1725 75     Resp 04/22/21 1725 20     Temp 04/22/21 1725 98.5 F (36.9 C)     Temp src  --      SpO2 04/22/21 1725 98 %     Weight --      Height --      Head Circumference --      Peak Flow --      Pain Score 04/22/21 1726 4     Pain Loc --      Pain Edu? --      Excl. in [place]? --    No data found.  Updated Vital Signs BP (!) 192/85   Pulse 75   Temp 98.5 F (36.9 C)   Resp 20   SpO2 98%   Visual Acuity Right Eye Distance:   Left Eye Distance:   Bilateral Distance:  Right Eye Near:   Left Eye Near:    Bilateral Near:     Physical Exam Vitals and nursing note reviewed.  Constitutional:      General: He is not in acute distress.    Appearance: He is well-developed. He is ill-appearing.  HENT:     Head: Normocephalic and atraumatic.     Right Ear: Tympanic membrane, ear canal and external ear normal.     Left Ear: Tympanic membrane, ear canal and external ear normal.     Nose: Congestion present.     Mouth/Throat:     Mouth: Mucous membranes are moist.     Pharynx: Posterior oropharyngeal erythema present.  Eyes:     Extraocular Movements: Extraocular movements intact.     Conjunctiva/sclera: Conjunctivae normal.     Pupils: Pupils are equal, round, and reactive to light.  Cardiovascular:     Rate and Rhythm: Normal rate and regular rhythm.     Heart sounds: Normal heart sounds. No murmur heard. Pulmonary:     Effort: Pulmonary effort is normal. No respiratory distress.     Breath sounds: No stridor. Wheezing, rhonchi and rales present.  Chest:     Chest wall: No tenderness.  Musculoskeletal:        General: Normal range of motion.     Cervical back: Normal range of motion and neck supple.  Lymphadenopathy:     Cervical: Cervical adenopathy present.  Skin:    General: Skin is warm and dry.  Neurological:     General: No focal deficit present.     Mental Status: He is alert and oriented to person, place, and time.  Psychiatric:        Mood and Affect: Mood normal.        Behavior: Behavior normal.        Thought Content: Thought content  normal.     UC Treatments / Results  Labs (all labs ordered are listed, but only abnormal results are displayed) Labs Reviewed  COVID-19, FLU A+B NAA   Narrative:    Test(s) 140142-Influenza A, NAA; 140143-Influenza B, NAA was developed and its performance characteristics determined by [facility]. It has not been cleared or approved by the Food and Drug Administration. Performed at:  [address] [place], [place]  [id number] Lab Director: Rush Farmer MD, Phone:  [phone]    EKG   Radiology DG Chest 2 View  Result Date: 04/22/2021 CLINICAL DATA:  Cough shortness of breath and rib pain. EXAM: CHEST - 2 VIEW COMPARISON:  CT abdomen and pelvis from May 29, 2020. FINDINGS: Trachea midline. Cardiomediastinal contours and hilar structures are normal. Lungs are clear. No sign of pneumothorax. No sign of pleural effusion. On limited assessment no acute skeletal process. IMPRESSION: No acute cardiopulmonary disease. Electronically Signed   By: Zetta Bills M.D.   On: 04/22/2021 17:55    Procedures Procedures (including critical care time)  Medications Ordered in UC Medications  methylPREDNISolone sodium succinate (SOLU-MEDROL) 125 mg/2 mL injection 125 mg (125 mg Intramuscular Given 04/22/21 1803)  albuterol (VENTOLIN HFA) 108 (90 Base) MCG/ACT inhaler 2 puff (2 puffs Inhalation Given 04/22/21 1804)    Initial Impression / Assessment and Plan / UC Course  I have reviewed the triage vital signs and the nursing notes.  Pertinent labs & imaging results that were available during my care of the patient were reviewed by me and considered in my medical decision making (see chart for details).  Lower Respiratory infection  Solumedrol 178m IM in office today Albuterol inhaler given in office today Prescribed azithromycin Take as directed and to completion Prescribed tessalon perles prn cough during the day Prescribed promethazine cough syrup Sedation  precautions given Covid and flu swab obtained in office today.   Patient instructed to quarantine until results are back and negative.   If results are negative, patient may resume daily schedule as tolerated once they are fever free for 24 hours without the use of antipyretic medications.   If results are positive, patient instructed to quarantine for at least 5 days from symptom onset.  If after 5 days symptoms have resolved, may return to work with a well fitting mask for the next 5 days. If symptomatic after day 5, isolation should be extended to 10 days. Patient instructed to follow-up with primary care or with this office as needed.   Patient instructed to follow-up in the ER for trouble swallowing, trouble breathing, other concerning symptoms.   Final Clinical Impressions(s) / UC Diagnoses   Final diagnoses:  Lower respiratory infection     Discharge Instructions      You have received a steroid injection in the office  You xray is negative for pneumonia today, but I am going to treat you for an lower respiratory infection  I have sent in azithromycin for you to take. Take 2 tablets today, then one tablet daily for the next 4 days.  [place] use one capsule every 8 hours as needed for cough.  I have sent in cough syrup for you to take. This medication can make you sleepy. Do not drive while taking this medication.  Your COVID and Influenza tests are pending.  You should self quarantine until the test results are back.    Take Tylenol or ibuprofen as needed for fever or discomfort.  Rest and keep yourself hydrated.    Follow-up with your primary care provider if your symptoms are not improving.          ED Prescriptions     Medication Sig Dispense Auth. Provider   azithromycin (ZITHROMAX) 250 MG tablet Take 1 tablet (250 mg total) by mouth daily. Take first 2 tablets together, then 1 every day until finished. 6 tablet MFaustino Congress NP   benzonatate (TESSALON) 100 MG capsule Take 1 capsule (100 mg total) by mouth every 8 (eight) hours. 21 capsule MFaustino Congress NP   promethazine-dextromethorphan (PROMETHAZINE-DM) 6.25-15 MG/5ML syrup Take 5 mLs by mouth 4 (four) times daily as needed for cough. 118 mL MFaustino Congress NP      PDMP not reviewed this encounter.   MFaustino Congress NP 04/23/21 1827

## 2021-04-22 NOTE — ED Triage Notes (Signed)
Pt presents with cough for past week, no fever

## 2021-04-22 NOTE — Discharge Instructions (Addendum)
You have received a steroid injection in the office  You xray is negative for pneumonia today, but I am going to treat you for an lower respiratory infection  I have sent in azithromycin for you to take. Take 2 tablets today, then one tablet daily for the next 4 days.  I have sent in [place] for you to use one capsule every 8 hours as needed for cough.  I have sent in cough syrup for you to take. This medication can make you sleepy. Do not drive while taking this medication.  Your COVID and Influenza tests are pending.  You should self quarantine until the test results are back.    Take Tylenol or ibuprofen as needed for fever or discomfort.  Rest and keep yourself hydrated.    Follow-up with your primary care provider if your symptoms are not improving.

## 2021-04-23 LAB — COVID-19, FLU A+B NAA
Influenza A, NAA: NOT DETECTED
Influenza B, NAA: NOT DETECTED
SARS-CoV-2, NAA: NOT DETECTED

## 2021-05-12 ENCOUNTER — Emergency Department
Admission: EM | Admit: 2021-05-12 | Discharge: 2021-05-12 | Disposition: A | Discharge status: Home / Self Care | Payer: BC Managed Care – PPO | Attending: Emergency Medicine | Admitting: Emergency Medicine

## 2021-05-12 ENCOUNTER — Other Ambulatory Visit: Payer: Self-pay

## 2021-05-12 ENCOUNTER — Emergency Department: Payer: BC Managed Care – PPO

## 2021-05-12 ENCOUNTER — Ambulatory Visit: Payer: Self-pay | Admitting: Urology

## 2021-05-12 DIAGNOSIS — Z7984 Long term (current) use of oral hypoglycemic drugs: Secondary | ICD-10-CM | POA: Diagnosis not present

## 2021-05-12 DIAGNOSIS — I10 Essential (primary) hypertension: Secondary | ICD-10-CM | POA: Insufficient documentation

## 2021-05-12 DIAGNOSIS — R2243 Localized swelling, mass and lump, lower limb, bilateral: Secondary | ICD-10-CM | POA: Insufficient documentation

## 2021-05-12 DIAGNOSIS — Z7982 Long term (current) use of aspirin: Secondary | ICD-10-CM | POA: Insufficient documentation

## 2021-05-12 DIAGNOSIS — Z79899 Other long term (current) drug therapy: Secondary | ICD-10-CM | POA: Diagnosis not present

## 2021-05-12 DIAGNOSIS — R519 Headache, unspecified: Secondary | ICD-10-CM | POA: Diagnosis present

## 2021-05-12 DIAGNOSIS — E119 Type 2 diabetes mellitus without complications: Secondary | ICD-10-CM | POA: Insufficient documentation

## 2021-05-12 LAB — CBC
HCT: 28.5 % — ABNORMAL LOW (ref 39.0–52.0)
Hemoglobin: 9.6 g/dL — ABNORMAL LOW (ref 13.0–17.0)
MCH: 30.3 pg (ref 26.0–34.0)
MCHC: 33.7 g/dL (ref 30.0–36.0)
MCV: 89.9 fL (ref 80.0–100.0)
Platelets: 174 10*3/uL (ref 150–400)
RBC: 3.17 MIL/uL — ABNORMAL LOW (ref 4.22–5.81)
RDW: 13.1 % (ref 11.5–15.5)
WBC: 5 10*3/uL (ref 4.0–10.5)
nRBC: 0 % (ref 0.0–0.2)

## 2021-05-12 LAB — BASIC METABOLIC PANEL
Anion gap: 2 — ABNORMAL LOW (ref 5–15)
BUN: 24 mg/dL — ABNORMAL HIGH (ref 6–20)
CO2: 24 mmol/L (ref 22–32)
Calcium: 8.4 mg/dL — ABNORMAL LOW (ref 8.9–10.3)
Chloride: 115 mmol/L — ABNORMAL HIGH (ref 98–111)
Creatinine, Ser: 1.31 mg/dL — ABNORMAL HIGH (ref 0.61–1.24)
GFR, Estimated: >60 mL/min (ref >60)
Glucose, Bld: 144 mg/dL — ABNORMAL HIGH (ref 70–99)
Potassium: 4.5 mmol/L (ref 3.5–5.1)
Sodium: 141 mmol/L (ref 135–145)

## 2021-05-12 LAB — BRAIN NATRIURETIC PEPTIDE: B Natriuretic Peptide: 124 pg/mL — ABNORMAL HIGH (ref 0.0–100.0)

## 2021-05-12 MED ORDER — HYDRALAZINE HCL 20 MG/ML IJ SOLN
10.0000 mg | Freq: Once | INTRAMUSCULAR | Status: AC
  Administered 2021-05-12: 10 mg via INTRAVENOUS
  Filled 2021-05-12: qty 1

## 2021-05-12 MED ORDER — LISINOPRIL 5 MG PO TABS: 2.5000 mg | ORAL_TABLET | Freq: Every day | ORAL | Status: DC

## 2021-05-12 MED ORDER — LABETALOL HCL 5 MG/ML IV SOLN
10.0000 mg | Freq: Once | INTRAVENOUS | Status: DC
  Filled 2021-05-12: qty 4

## 2021-05-12 NOTE — ED Notes (Signed)
Dr. Tamala Julian aware of patient discharge vitals. No new orders, patient ok for discharge.

## 2021-05-12 NOTE — ED Notes (Signed)
Patient transported to CT 

## 2021-05-12 NOTE — ED Notes (Signed)
Patient is resting comfortably. Remote provided.

## 2021-05-12 NOTE — ED Notes (Signed)
ED Provider at bedside. 

## 2021-05-12 NOTE — ED Provider Notes (Signed)
[facility] Emergency Department Provider Note  ____________________________________________   Event Date/Time   First MD Initiated Contact with Patient 05/12/21 1801     (approximate)  I have reviewed the triage vital signs and the nursing notes.   HISTORY  Chief Complaint Hypertension   HPI Brian Nielsen is a 60 y.o. male with longstanding high blood pressure currently on low-dose lisinopril he sustained, anemia, BPH, diabetes, HDL and several weeks of retinal hemorrhages and cloudy vision currently followed by ophthalmology with patient receiving weekly intravitreal injections presents to the ED after referred by his PCP for assessment of elevated blood pressures.  Patient states he is his blood pressure been quite high and has had intermittent headaches the last couple days but does not have any headache last couple hours.  He has not had any chest pain, cough, shortness of breath, abdominal pain, earache, sore throat, vomiting, diarrhea, burning with urination or rash or recent falls or injuries.  He denies tobacco abuse, significant caffeine use or illicit drug use.  No other acute concerns at this time.         Past Medical History:  Diagnosis Date   Adult celiac disease    Anemia    BPH (benign prostatic hyperplasia)    DM (diabetes mellitus) (HCC)    Type II   GERD (gastroesophageal reflux disease)    HTN (hypertension)    Hyperlipidemia    Thoracic spondylosis without myelopathy 11/22/2016    Patient Active Problem List   Diagnosis Date Noted   Thoracic spondylosis without myelopathy 11/22/2016   Vitamin D deficiency 11/22/2016   HLD (hyperlipidemia) 10/19/2016   BPH (benign prostatic hyperplasia) 10/19/2016   Essential hypertension 10/19/2016   Diabetes mellitus, insulin dependent (IDDM), controlled 10/19/2016   Celiac sprue 05/23/2014   Anemia, iron deficiency 04/19/2014   GERD (gastroesophageal reflux disease) 04/19/2014    Past  Surgical History:  Procedure Laterality Date   COLONOSCOPY  2003   Dr. Laural Golden: two small polyps, small ext hemorrhoids.path:focal adenomatous changes   COLONOSCOPY N/A 05/03/2014   Procedure: COLONOSCOPY;  Surgeon: Danie Binder, MD;  Location: [facility];  Service: Endoscopy;  Laterality: N/A;  12:00   ESOPHAGOGASTRODUODENOSCOPY N/A 05/03/2014   Procedure: ESOPHAGOGASTRODUODENOSCOPY (EGD);  Surgeon: Danie Binder, MD;  Location: [facility];  Service: Endoscopy;  Laterality: N/A;   ESOPHAGOGASTRODUODENOSCOPY (EGD) WITH PROPOFOL N/A 06/03/2020   Procedure: ESOPHAGOGASTRODUODENOSCOPY (EGD) WITH PROPOFOL;  Surgeon: [name], MD;  Location: [facility];  Service: Endoscopy;  Laterality: N/A;   PARS PLANA VITRECTOMY Right 03/21/2021   Procedure: PARS PLANA VITRECTOMY 25 GAUGE WITH INJECTION OF ANTIBIOTICS FOR ENDOPHTHALMITIS;  Surgeon: Jalene Mullet, MD;  Location: [facility];  Service: Ophthalmology;  Laterality: Right;    Prior to Admission medications   Medication Sig Start Date End Date Taking? Authorizing Provider  amitriptyline (ELAVIL) 25 MG tablet Take 1 tablet (25 mg total) by mouth at bedtime. Patient not taking: No sig reported 12/19/19   Penumalli, Earlean Polka, MD  Ascorbic Acid (VITAMIN C) 1000 MG tablet Take 1,000 mg by mouth daily.    [provider]  aspirin 81 MG EC tablet Take 81 mg by mouth daily.    [provider]  atorvastatin (LIPITOR) 40 MG tablet Take 1 tablet (40 mg total) by mouth daily. Patient taking differently: Take 40 mg by mouth every evening. 10/21/16   Raylene Everts, MD  azithromycin (ZITHROMAX) 250 MG tablet Take 1 tablet (250 mg total)  by mouth daily. Take first 2 tablets together, then 1 every day until finished. 04/22/21   Faustino Congress, NP  B Complex Vitamins (B COMPLEX PO) Take 1 tablet by mouth daily.    [provider]  benzonatate (TESSALON) 100 MG capsule Take 1 capsule (100 mg total) by mouth every 8 (eight)  hours. 04/22/21   Faustino Congress, NP  Cholecalciferol (VITAMIN D3 PO) Take 1 capsule by mouth daily.    [provider]  glipiZIDE (GLUCOTROL) 5 MG tablet Take 1 tablet (5 mg total) by mouth 2 (two) times daily before a meal. Patient taking differently: Take 5 mg by mouth daily before breakfast. 01/12/17   Raylene Everts, MD  JARDIANCE 25 MG TABS tablet Take 25 mg by mouth every evening. 09/08/19   [provider]  LINZESS 145 MCG CAPS capsule Take 145 mcg by mouth daily as needed for constipation. 11/12/20   [provider]  lisinopril (ZESTRIL) 2.5 MG tablet Take 2.5 mg by mouth at bedtime. 11/10/20   [provider]  metFORMIN (GLUCOPHAGE) 500 MG tablet Take 2 tablets (1,000 mg total) by mouth 2 (two) times daily with a meal. Patient not taking: Reported on 03/21/2021 11/22/16   Raylene Everts, MD  Multiple Vitamin (MULTIVITAMIN) tablet Take 1 tablet by mouth daily.    [provider]  OZEMPIC, 1 MG/DOSE, 4 MG/3ML SOPN Inject 1 mg into the skin once a week. 11/03/20   [provider]  pantoprazole (PROTONIX) 40 MG tablet Take 40 mg by mouth every evening. 09/08/19   [provider]  promethazine-dextromethorphan (PROMETHAZINE-DM) 6.25-15 MG/5ML syrup Take 5 mLs by mouth 4 (four) times daily as needed for cough. 04/22/21   Faustino Congress, NP  tamsulosin (FLOMAX) 0.4 MG CAPS capsule Take 1 capsule (0.4 mg total) by mouth daily after supper. 10/19/16   Raylene Everts, MD    Allergies Codeine  Family History  Problem Relation Age of Onset   Heart disease Father    Diabetes Father    Hypertension Father    Hyperlipidemia Father    Aneurysm Mother        brain   Hypertension Mother    Cancer Maternal Grandmother        melanoma   Heart disease Paternal Grandfather    Colon cancer Neg Hx     Social History Social History   Tobacco Use   Smoking status: Never   Smokeless tobacco: Current    Types: Snuff    Tobacco comments:    Quit x 30 years  Vaping Use   Vaping Use: Never used  Substance Use Topics   Alcohol use: Not Currently    Comment: occasionally on weekends   Drug use: No    Review of Systems  Review of Systems  Constitutional:  Negative for chills and fever.  HENT:  Negative for sore throat.   Eyes:  Positive for blurred vision. Negative for pain.  Respiratory:  Negative for cough and stridor.   Cardiovascular:  Negative for chest pain.  Gastrointestinal:  Negative for vomiting.  Genitourinary:  Negative for dysuria.  Musculoskeletal:  Negative for myalgias.  Skin:  Negative for rash.  Neurological:  Positive for headaches. Negative for seizures and loss of consciousness.  Psychiatric/Behavioral:  Negative for suicidal ideas.   All other systems reviewed and are negative.    ____________________________________________   PHYSICAL EXAM:  VITAL SIGNS: ED Triage Vitals  Enc Vitals Group     BP 05/12/21 1649 Marland Kitchen)  233/104     Pulse Rate 05/12/21 1649 60     Resp 05/12/21 1649 19     Temp 05/12/21 1649 98 F (36.7 C)     Temp src --      SpO2 05/12/21 1649 98 %     Weight --      Height --      Head Circumference --      Peak Flow --      Pain Score 05/12/21 1647 5     Pain Loc --      Pain Edu? --      Excl. in [place]? --    Vitals:   05/12/21 1850 05/12/21 1900  BP: (!) 181/86 (!) 188/78  Pulse: 60   Resp: 16 16  Temp:    SpO2: 100%    Physical Exam Vitals and nursing note reviewed.  Constitutional:      Appearance: He is well-developed.  HENT:     Head: Normocephalic and atraumatic.     Right Ear: External ear normal.     Left Ear: External ear normal.     Nose: Nose normal.  Eyes:     Conjunctiva/sclera: Conjunctivae normal.  Cardiovascular:     Rate and Rhythm: Normal rate and regular rhythm.     Heart sounds: No murmur heard. Pulmonary:     Effort: Pulmonary effort is normal. No respiratory distress.     Breath sounds: Normal breath sounds.   Abdominal:     Palpations: Abdomen is soft.     Tenderness: There is no abdominal tenderness.  Musculoskeletal:     Cervical back: Neck supple.     Right lower leg: Edema present.     Left lower leg: Edema present.  Skin:    General: Skin is warm and dry.     Capillary Refill: Capillary refill takes less than 2 seconds.  Neurological:     Mental Status: He is alert and oriented to person, place, and time.    Cranial nerves II through XII grossly intact.  No pronator drift.  No finger dysmetria.  Symmetric 5/5 strength of all extremities.  Sensation intact to light touch in all extremities.  Unremarkable unassisted gait.  ____________________________________________   LABS (all labs ordered are listed, but only abnormal results are displayed)  Labs Reviewed  CBC - Abnormal; Notable for the following components:      Result Value   RBC 3.17 (*)    Hemoglobin 9.6 (*)    HCT 28.5 (*)    All other components within normal limits  BASIC METABOLIC PANEL - Abnormal; Notable for the following components:   Chloride 115 (*)    Glucose, Bld 144 (*)    BUN 24 (*)    Creatinine, Ser 1.31 (*)    Calcium 8.4 (*)    Anion gap 2 (*)    All other components within normal limits  BRAIN NATRIURETIC PEPTIDE - Abnormal; Notable for the following components:   B Natriuretic Peptide 124.0 (*)    All other components within normal limits   ____________________________________________  EKG  Sinus rhythm with a ventricular rate of 60, normal axis, unremarkable intervals without evidence of acute ischemia or significant Arrhythmia. ____________________________________________  RADIOLOGY  ED MD interpretation: No evidence of intracranial hemorrhage, CVA or other clear acute intracranial process.  Official radiology report(s): CT Head Wo Contrast  Result Date: 05/12/2021 CLINICAL DATA:  Bilateral foot swelling, hypertension, headache blurry vision EXAM: CT HEAD WITHOUT CONTRAST TECHNIQUE:  Contiguous  axial images were obtained from the base of the skull through the vertex without intravenous contrast. COMPARISON:  None. FINDINGS: Brain: No evidence of acute infarction, hemorrhage, hydrocephalus, extra-axial collection, visible mass lesion or mass effect. Basal cisterns are patent. Midline intracranial structures are unremarkable. Vascular: Atherosclerotic calcification of the carotid siphons. No hyperdense vessel. Skull: No calvarial fracture or suspicious osseous lesion. No scalp swelling or hematoma. Sinuses/Orbits: Paranasal sinuses and mastoid air cells are predominantly clear. Included orbital structures are unremarkable. Other: None. IMPRESSION: No acute intracranial abnormality. Electronically Signed   By: Lovena Le M.D.   On: 05/12/2021 18:44    ____________________________________________   PROCEDURES  Procedure(s) performed (including Critical Care):  .1-3 Lead EKG Interpretation  Date/Time: 05/12/2021 7:16 PM Performed by: Lucrezia Starch, MD Authorized by: Lucrezia Starch, MD     Interpretation: normal     ECG rate assessment: normal     Rhythm: sinus rhythm     Ectopy: none     Conduction: normal     ____________________________________________   INITIAL IMPRESSION / ASSESSMENT AND PLAN / ED COURSE     Patient presents to the ED after being referred by his PCP for elevated blood pressures in the setting of currently being treated for hypertension for low-dose meds but spacing some headaches and not being treated for hypertensive atrial hemorrhages by his ophthalmologist.  Patient states he does not currently have any headache and has not had any associated chest pain or focal weakness or other clear associated symptoms.  On arrival he was noted to be hypertensive on arrival he is noted to hypertensive with blood pressures ranging on arrival he is noted to hypertensive with BPs ranging between 233 and 250/104-195.  He has no focal deficits or findings on CT  head to suggest CVA to suggest [place].  Overall have low suspicion for dissection at this time.  CBC shows chronic anemia with hemoglobin 9.6 compared to 10.23-monthago.  No leukocytosis.  BMP shows creatinine of 1.31 compared to 1.553-monthgo.  No other significant electrolyte or metabolic derangements.  BNP is slightly elevated at 124 with possible patient may have little bit of congestion related to his high blood pressures he has no hypoxia tachypnea and denies any chest pain or shortness of breath I do not believe he requires emergent diuresis or further work-up of this at this time.  He was given a dose of his home blood pressure medicines and 1 low-dose of hydralazine on reassessment he had a BP of 188/78.  Given improvement blood pressure with a stable vital signs and reassuring exam and work-up I think is stable for close outpatient PCP follow-up.  Discharged stable condition.  Strict return precautions advised and discussed.       ____________________________________________   FINAL CLINICAL IMPRESSION(S) / ED DIAGNOSES  Final diagnoses:  Hypertension, unspecified type    Medications  lisinopril (ZESTRIL) tablet 2.5 mg (has no administration in time range)  labetalol (NORMODYNE) injection 10 mg (0 mg Intravenous Hold 05/12/21 1835)  hydrALAZINE (APRESOLINE) injection 10 mg (10 mg Intravenous Given 05/12/21 1837)     ED Discharge Orders     None        Note:  This document was prepared using Dragon voice recognition software and may include unintentional dictation errors.    SmLucrezia StarchMD 05/12/21 [id number]

## 2021-05-12 NOTE — ED Notes (Signed)
Patient returned to the ED from CT. Patient returned to cardiac monitoring.

## 2021-05-12 NOTE — ED Notes (Signed)
Patient denies chest pain. Patient reports swelling to bilateral lower extremities to calves. Patient also reports some headache and blurry vision. Patient reports HTN x today. Patient reports compliance with HTN meds at home.

## 2021-05-12 NOTE — ED Triage Notes (Signed)
Pt comes with c/o bilateral foot swelling and HTN. Pt states he takes BP meds but has noticed some blurry vision. Pt states he noticed this all over the weekend.

## 2021-05-12 NOTE — ED Notes (Signed)
ED Provider at bedside. This RN at bedside to discharge patient. Wife at bedside reports questions for MD. Dr. Tamala Julian at bedside to answer questions for wife.

## 2021-05-12 NOTE — ED Notes (Signed)
Patient HR of 60 pre-labetalol discussed with Dr. Tamala Julian, awaiting response at this time.

## 2021-05-28 ENCOUNTER — Encounter: Payer: Self-pay | Admitting: Internal Medicine

## 2021-05-28 ENCOUNTER — Other Ambulatory Visit: Payer: Self-pay

## 2021-05-28 ENCOUNTER — Inpatient Hospital Stay: Payer: BC Managed Care – PPO

## 2021-05-28 ENCOUNTER — Inpatient Hospital Stay: Payer: BC Managed Care – PPO | Attending: Internal Medicine | Admitting: Internal Medicine

## 2021-05-28 DIAGNOSIS — Z808 Family history of malignant neoplasm of other organs or systems: Secondary | ICD-10-CM | POA: Insufficient documentation

## 2021-05-28 DIAGNOSIS — K900 Celiac disease: Secondary | ICD-10-CM | POA: Diagnosis not present

## 2021-05-28 DIAGNOSIS — E119 Type 2 diabetes mellitus without complications: Secondary | ICD-10-CM | POA: Insufficient documentation

## 2021-05-28 DIAGNOSIS — D649 Anemia, unspecified: Secondary | ICD-10-CM | POA: Insufficient documentation

## 2021-05-28 DIAGNOSIS — Z809 Family history of malignant neoplasm, unspecified: Secondary | ICD-10-CM | POA: Diagnosis not present

## 2021-05-28 DIAGNOSIS — N183 Chronic kidney disease, stage 3 unspecified: Secondary | ICD-10-CM | POA: Diagnosis not present

## 2021-05-28 LAB — TECHNOLOGIST SMEAR REVIEW
Plt Morphology: NORMAL
RBC MORPHOLOGY: NORMAL
WBC MORPHOLOGY: NORMAL

## 2021-05-28 LAB — CBC WITH DIFFERENTIAL/PLATELET
Abs Immature Granulocytes: 0.03 K/uL (ref 0.00–0.07)
Basophils Absolute: 0 K/uL (ref 0.0–0.1)
Basophils Relative: 1 %
Eosinophils Absolute: 0.3 K/uL (ref 0.0–0.5)
Eosinophils Relative: 6 %
HCT: 33.1 % — ABNORMAL LOW (ref 39.0–52.0)
Hemoglobin: 10.9 g/dL — ABNORMAL LOW (ref 13.0–17.0)
Immature Granulocytes: 1 %
Lymphocytes Relative: 13 %
Lymphs Abs: 0.7 K/uL (ref 0.7–4.0)
MCH: 29.6 pg (ref 26.0–34.0)
MCHC: 32.9 g/dL (ref 30.0–36.0)
MCV: 89.9 fL (ref 80.0–100.0)
Monocytes Absolute: 0.4 K/uL (ref 0.1–1.0)
Monocytes Relative: 8 %
Neutro Abs: 3.9 K/uL (ref 1.7–7.7)
Neutrophils Relative %: 71 %
Platelets: 214 K/uL (ref 150–400)
RBC: 3.68 MIL/uL — ABNORMAL LOW (ref 4.22–5.81)
RDW: 12.9 % (ref 11.5–15.5)
WBC: 5.4 K/uL (ref 4.0–10.5)
nRBC: 0 % (ref 0.0–0.2)

## 2021-05-28 LAB — COMPREHENSIVE METABOLIC PANEL
ALT: 27 U/L (ref 0–44)
AST: 26 U/L (ref 15–41)
Albumin: 3.8 g/dL (ref 3.5–5.0)
Alkaline Phosphatase: 85 U/L (ref 38–126)
Anion gap: 8 (ref 5–15)
BUN: 34 mg/dL — ABNORMAL HIGH (ref 6–20)
CO2: 28 mmol/L (ref 22–32)
Calcium: 9.8 mg/dL (ref 8.9–10.3)
Chloride: 103 mmol/L (ref 98–111)
Creatinine, Ser: 1.84 mg/dL — ABNORMAL HIGH (ref 0.61–1.24)
GFR, Estimated: 42 mL/min — ABNORMAL LOW (ref >60)
Glucose, Bld: 153 mg/dL — ABNORMAL HIGH (ref 70–99)
Potassium: 4 mmol/L (ref 3.5–5.1)
Sodium: 139 mmol/L (ref 135–145)
Total Bilirubin: 0.6 mg/dL (ref 0.3–1.2)
Total Protein: 7.2 g/dL (ref 6.5–8.1)

## 2021-05-28 LAB — IRON AND TIBC
Iron: 57 ug/dL (ref 45–182)
Saturation Ratios: 14 % — ABNORMAL LOW (ref 17.9–39.5)
TIBC: 399 ug/dL (ref 250–450)
UIBC: 342 ug/dL

## 2021-05-28 LAB — FERRITIN: Ferritin: 71 ng/mL (ref 24–336)

## 2021-05-28 LAB — RETICULOCYTES
Immature Retic Fract: 13.5 % (ref 2.3–15.9)
RBC.: 3.66 MIL/uL — ABNORMAL LOW (ref 4.22–5.81)
Retic Count, Absolute: 74.7 10*3/uL (ref 19.0–186.0)
Retic Ct Pct: 2 % (ref 0.4–3.1)

## 2021-05-28 LAB — VITAMIN B12: Vitamin B-12: 1348 pg/mL — ABNORMAL HIGH (ref 180–914)

## 2021-05-28 LAB — FOLATE: Folate: 12.6 ng/mL

## 2021-05-28 LAB — LACTATE DEHYDROGENASE: LDH: 153 U/L (ref 98–192)

## 2021-05-28 NOTE — Progress Notes (Signed)
[facility] CONSULT NOTE  Patient Care Team: Dion Body, MD as PCP - General (Family Medicine) [name], NP as Nurse Practitioner (Endocrinology)  CHIEF COMPLAINTS/PURPOSE OF CONSULTATION: ANEMIA   HEMATOLOGY HISTORY:  # ANEMIA PROGRESSIVE since 2020 12; 2022- 10 MCV- 90s; colonoscopy 2015; multiple endoscopies-last September 2021-biopsy active celiac disease   #CKD stage III/diabetes [[facility]]; ? Celaic disease [[facility]]; -----------------------------------------------------------------------------------------------------------------------------------------------------------------------  - Labs: 07/2014 - tTG IgA 101 03/2020 - positive endomysial Ab IgA, tTG IgA 78 - CSY: 07/2014 - normal appearing terminal ileum, sigmoid diverticulosis, and non-bleeding internal hemorrhoids - EGD: 07/2014 - mild non-erosive gastritis, normal appearing duodenal mucosa with duodenal biopsies showing celiac sprue - EGD: 06/03/2020 - procedure aborted due to presence of food - EGD: 08/12/2020 - irregular Z-line found at [facility] with bx showing mild reflux esophagitis, 1 cm hiatal hernia, mild chronic gastritis, decreased folds found in entire duodenum, flattening found in entire duodenum and scalloped mucosa with bx showing active Celiac disease with villous atrophy, increased intraepithelial lymphocytes, etc. Repeat in 1-year.  HISTORY OF PRESENTING ILLNESS:  Brian Nielsen 60 y.o.  male has been referred to [place] for further evaluation/work-up for anemia.  Patient noticed to have worsening fatigue for the last many months.  Complains of shortness of breath on exertion.  He feels tired after minimal exertion   Blood in stools: None Change in bowel habits- constipation/diarrhea Blood in urine: None Difficulty swallowing: None Abnormal weight loss: 30 pounds/last year [gluten free diet] Iron supplementation:poor tleoance Prior Blood transfusions: none Bariatric surgery:  None  Review of Systems  Constitutional:  Positive for malaise/fatigue and weight loss. Negative for chills, diaphoresis and fever.  HENT:  Negative for nosebleeds and sore throat.   Eyes:  Negative for double vision.  Respiratory:  Positive for shortness of breath. Negative for cough, hemoptysis, sputum production and wheezing.   Cardiovascular:  Negative for chest pain, palpitations, orthopnea and leg swelling.  Gastrointestinal:  Positive for abdominal pain, constipation and diarrhea. Negative for blood in stool, heartburn, melena, nausea and vomiting.  Genitourinary:  Negative for dysuria, frequency and urgency.  Musculoskeletal:  Positive for back pain and joint pain.  Skin: Negative.  Negative for itching and rash.  Neurological:  Positive for weakness. Negative for dizziness, tingling, focal weakness and headaches.  Endo/Heme/Allergies:  Does not bruise/bleed easily.  Psychiatric/Behavioral:  Negative for depression. The patient is not nervous/anxious and does not have insomnia.    MEDICAL HISTORY:  Past Medical History:  Diagnosis Date  . Adult celiac disease   . Anemia   . BPH (benign prostatic hyperplasia)   . DM (diabetes mellitus) ([place])    Type II  . GERD (gastroesophageal reflux disease)   . HTN (hypertension)   . Hyperlipidemia   . Thoracic spondylosis without myelopathy 11/22/2016    SURGICAL HISTORY: Past Surgical History:  Procedure Laterality Date  . COLONOSCOPY  2003   Dr. Laural Golden: two small polyps, small ext hemorrhoids.path:focal adenomatous changes  . COLONOSCOPY N/A 05/03/2014   Procedure: COLONOSCOPY;  Surgeon: Danie Binder, MD;  [facility];  Service: Endoscopy;  Laterality: N/A;  12:00  . ESOPHAGOGASTRODUODENOSCOPY N/A 05/03/2014   Procedure: ESOPHAGOGASTRODUODENOSCOPY (EGD);  Surgeon: Danie Binder, MD;  Location: [facility];  Service: Endoscopy;  Laterality: N/A;  . ESOPHAGOGASTRODUODENOSCOPY (EGD) WITH PROPOFOL N/A 06/03/2020    Procedure: ESOPHAGOGASTRODUODENOSCOPY (EGD) WITH PROPOFOL;  Surgeon: [name], MD;  Location: [facility];  Service: Endoscopy;  Laterality: N/A;  . PARS PLANA  VITRECTOMY Right 03/21/2021   Procedure: PARS PLANA VITRECTOMY 25 GAUGE WITH INJECTION OF ANTIBIOTICS FOR ENDOPHTHALMITIS;  Surgeon: Jalene Mullet, MD;  Location: [facility];  Service: Ophthalmology;  Laterality: Right;    SOCIAL HISTORY: Social History   Socioeconomic History  . Marital status: Married    Spouse name: [name]  . Number of children: 1  . Years of education: 68  . Highest education level: Not on file  Occupational History  . Occupation: Programmer, systems: [employer]    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  Tobacco Use  . Smoking status: Never  . Smokeless tobacco: Current    Types: Snuff  . Tobacco comments:    Quit x 30 years  Vaping Use  . Vaping Use: Never used  Substance and Sexual Activity  . Alcohol use: Not Currently    Comment: occasionally on weekends  . Drug use: No  . Sexual activity: Yes    Birth control/protection: Surgical  Other Topics Concern  . Not on file  Social History Narrative   Lives with wife   [place]   Caffeine- diet Mtn Dew, 2 glasses   Social Determinants of Health   Financial Resource Strain: Not on file  Food Insecurity: Not on file  Transportation Needs: Not on file  Physical Activity: Not on file  Stress: Not on file  Social Connections: Not on file  Intimate Partner Violence: Not on file    FAMILY HISTORY: Family History  Problem Relation Age of Onset  . Aneurysm Mother        brain  . Hypertension Mother   . Cancer Father   . Heart disease Father   . Diabetes Father   . Hypertension Father   . Hyperlipidemia Father   . Cancer Maternal Grandmother        melanoma  . Heart disease Paternal Grandfather   . Colon cancer Neg Hx     ALLERGIES:  is allergic to codeine.  MEDICATIONS:  Current Outpatient  Medications  Medication Sig Dispense Refill  . acetaminophen (TYLENOL) 650 MG CR tablet Take 1,300 mg by mouth as needed for pain.    . Ascorbic Acid (VITAMIN C) 1000 MG tablet Take 1,000 mg by mouth daily.    Marland Kitchen aspirin 81 MG EC tablet Take 81 mg by mouth daily.    Marland Kitchen atorvastatin (LIPITOR) 40 MG tablet Take 1 tablet (40 mg total) by mouth daily. (Patient taking differently: Take 40 mg by mouth every evening.) 90 tablet 3  . B Complex Vitamins (B COMPLEX PO) Take 1 tablet by mouth daily.    . Cholecalciferol (VITAMIN D3 PO) Take 1 capsule by mouth daily.    Marland Kitchen gabapentin (NEURONTIN) 300 MG capsule Take 3 capsules by mouth in the morning, at noon, and at bedtime.    Marland Kitchen glipiZIDE (GLUCOTROL) 5 MG tablet Take 1 tablet (5 mg total) by mouth 2 (two) times daily before a meal. (Patient taking differently: Take 5 mg by mouth daily before breakfast.) 60 tablet 0  . hydrochlorothiazide (HYDRODIURIL) 12.5 MG tablet Take 2 tablets by mouth daily.    Marland Kitchen JARDIANCE 25 MG TABS tablet Take 25 mg by mouth every evening.    Marland Kitchen LINZESS 145 MCG CAPS capsule Take 145 mcg by mouth daily as needed for constipation.    Marland Kitchen lisinopril (ZESTRIL) 2.5 MG tablet Take 2.5 mg by mouth at bedtime.    Marland Kitchen ofloxacin (OCUFLOX) 0.3 % ophthalmic solution Place 1 drop into  the right eye 4 (four) times daily.    Marland Kitchen OZEMPIC, 1 MG/DOSE, 4 MG/3ML SOPN Inject 1 mg into the skin once a week.    . pantoprazole (PROTONIX) 40 MG tablet Take 40 mg by mouth every evening.    . tamsulosin (FLOMAX) 0.4 MG CAPS capsule Take 1 capsule (0.4 mg total) by mouth daily after supper. 30 capsule 3   No current facility-administered medications for this visit.      PHYSICAL EXAMINATION:   Vitals:   05/28/21 1130  BP: (!) 145/89  Pulse: 61  Resp: 18  Temp: 97.8 F (36.6 C)   There were no vitals filed for this visit.  Physical Exam Vitals and nursing note reviewed.  Constitutional:      Comments: Ambulating: independently   with wife.   HENT:      Head: Normocephalic and atraumatic.     Mouth/Throat:     Pharynx: Oropharynx is clear.  Eyes:     Extraocular Movements: Extraocular movements intact.     Pupils: Pupils are equal, round, and reactive to light.  Cardiovascular:     Rate and Rhythm: Normal rate and regular rhythm.  Pulmonary:     Comments: Decreased breath sounds bilaterally.  Abdominal:     Palpations: Abdomen is soft.  Musculoskeletal:        General: Normal range of motion.     Cervical back: Normal range of motion.  Skin:    General: Skin is warm.  Neurological:     General: No focal deficit present.     Mental Status: He is alert and oriented to person, place, and time.  Psychiatric:        Behavior: Behavior normal.        Judgment: Judgment normal.    LABORATORY DATA:  I have reviewed the data as listed Lab Results  Component Value Date   WBC 5.4 05/28/2021   HGB 10.9 (L) 05/28/2021   HCT 33.1 (L) 05/28/2021   MCV 89.9 05/28/2021   PLT 214 05/28/2021   Recent Labs    03/21/21 0904 05/12/21 1650 05/28/21 1240  NA 144 141 139  K 3.9 4.5 4.0  CL 113* 115* 103  CO2 25 24 28   GLUCOSE 171* 144* 153*  BUN 23* 24* 34*  CREATININE 1.51* 1.31* 1.84*  CALCIUM 8.9 8.4* 9.8  GFRNONAA 53* >60 42*  PROT  --   --  7.2  ALBUMIN  --   --  3.8  AST  --   --  26  ALT  --   --  27  ALKPHOS  --   --  85  BILITOT  --   --  0.6     CT Head Wo Contrast  Result Date: 05/12/2021 CLINICAL DATA:  Bilateral foot swelling, hypertension, headache blurry vision EXAM: CT HEAD WITHOUT CONTRAST TECHNIQUE: Contiguous axial images were obtained from the base of the skull through the vertex without intravenous contrast. COMPARISON:  None. FINDINGS: Brain: No evidence of acute infarction, hemorrhage, hydrocephalus, extra-axial collection, visible mass lesion or mass effect. Basal cisterns are patent. Midline intracranial structures are unremarkable. Vascular: Atherosclerotic calcification of the carotid siphons. No  hyperdense vessel. Skull: No calvarial fracture or suspicious osseous lesion. No scalp swelling or hematoma. Sinuses/Orbits: Paranasal sinuses and mastoid air cells are predominantly clear. Included orbital structures are unremarkable. Other: None. IMPRESSION: No acute intracranial abnormality. Electronically Signed   By: Lovena Le M.D.   On: 05/12/2021 18:44    Symptomatic anemia #  Anemia-symptomatic fatigue hemoglobin 9-10-unclear etiology; however suspect multifactorial-iron malabsorption/celiac disease/CKD-stage III.  Also discussed the possibility of bone marrow etiology including malignancy; however clinically less likely.  Recommend holding off bone marrow biopsy at this time.  Given poor p.o. tolerance to oral iron recommend IV Venofer-if low iron.  Await on labs from today.  Discussed the potential acute infusion reactions with IV iron; which are quite rare.  Patient understands the risk; will proceed with infusions if needed.  #Etiology-unclear-await work-up including CBC CMP LDH Z97 folic acid reticulocyte count; iron studies ferritin/myeloma panel.  # Ckd stage III [GFR]-diabetes versus other causes.  Recommend follow-up with PCP/nephrology evaluation CT scan 2021 negative for any hydronephrosis.  # Diabtes [[facility] Hb A1c- 6.2]-continue follow-up with endocrinology/monitoring of blood sugars.  # ?  History of celiac disease- ? [facility]  Thank you Dr.Linthavong for allowing me to participate in the care of your pleasant patient. Please do not hesitate to contact me with questions or concerns in the interim.  The above plan of care was discussed with the patient and wife in detail.  Recommend.  # DISPOSITION: # labs- today- will order # follow up TBD-Dr.B     All questions were answered. The patient knows to call the clinic with any problems, questions or concerns.      Cammie Sickle, MD 05/28/2021 2:33 PM

## 2021-05-28 NOTE — Assessment & Plan Note (Addendum)
#  Anemia-symptomatic fatigue hemoglobin 9-10-unclear etiology; however suspect multifactorial-iron malabsorption/celiac disease/CKD-stage III.  Also discussed the possibility of bone marrow etiology including malignancy; however clinically less likely.  Recommend holding off bone marrow biopsy at this time.  Given poor p.o. tolerance to oral iron recommend IV Venofer-if low iron.  Await on labs from today.  Discussed the potential acute infusion reactions with IV iron; which are quite rare.  Patient understands the risk; will proceed with infusions if needed.  #Etiology-unclear-await work-up including CBC CMP LDH B52 folic acid reticulocyte count; iron studies ferritin/myeloma panel.  # Ckd stage III [GFR]-diabetes versus other causes.  Recommend follow-up with PCP/nephrology evaluation CT scan 2021 negative for any hydronephrosis.  # Diabtes [[facility] Hb A1c- 6.2]-continue follow-up with endocrinology/monitoring of blood sugars.  # ?  History of celiac disease- ? [facility]  Thank you Dr.Linthavong for allowing me to participate in the care of your pleasant patient. Please do not hesitate to contact me with questions or concerns in the interim.  The above plan of care was discussed with the patient and wife in detail.  Recommend.  # DISPOSITION: # labs- today- will order # follow up TBD-Dr.B

## 2021-05-29 ENCOUNTER — Telehealth: Payer: Self-pay | Admitting: Internal Medicine

## 2021-05-29 DIAGNOSIS — D649 Anemia, unspecified: Secondary | ICD-10-CM

## 2021-05-29 LAB — KAPPA/LAMBDA LIGHT CHAINS
Kappa free light chain: 66 mg/L — ABNORMAL HIGH (ref 3.3–19.4)
Kappa, lambda light chain ratio: 1.88 — ABNORMAL HIGH (ref 0.26–1.65)
Lambda free light chains: 35.2 mg/L — ABNORMAL HIGH (ref 5.7–26.3)

## 2021-05-29 NOTE — Telephone Encounter (Signed)
On 7/15- called pt- recommend IV iron infusion given the low iron saturation/insufficiency.  Recommend follow-up with nephrology/awaiting appointment with nephrology per Dr.L..  C-schedule Venofer weekly x3; start later next week  Also schedule follow-up-MD; labs CBC BMP; possible management in 6 weeks.   Thanks GB

## 2021-06-01 ENCOUNTER — Telehealth: Payer: Self-pay | Admitting: Internal Medicine

## 2021-06-01 NOTE — Telephone Encounter (Signed)
Spoke with patient's wife. Scheduled iron infusions and MD f/u. She confirmed days and times.

## 2021-06-01 NOTE — Telephone Encounter (Signed)
  [place]/ Jennifer  Please schedule patient for Venofer weekly x3; start later next week   Also schedule follow-up-MD; labs CBC BMP; possible venofer in 6 weeks.

## 2021-06-01 NOTE — Addendum Note (Signed)
Addended by: Gloris Ham on: 06/01/2021 12:21 PM   Modules accepted: Orders

## 2021-06-02 LAB — IMMUNOFIXATION ELECTROPHORESIS
IgA: 185 mg/dL (ref 90–386)
IgG (Immunoglobin G), Serum: 821 mg/dL (ref 603–1613)
IgM (Immunoglobulin M), Srm: 122 mg/dL (ref 20–172)
Total Protein ELP: 6.6 g/dL (ref 6.0–8.5)

## 2021-06-05 ENCOUNTER — Inpatient Hospital Stay: Payer: BC Managed Care – PPO

## 2021-06-05 VITALS — BP 123/71 | HR 61 | Temp 97.0°F | Resp 18

## 2021-06-05 DIAGNOSIS — D649 Anemia, unspecified: Secondary | ICD-10-CM | POA: Diagnosis not present

## 2021-06-05 MED ORDER — IRON SUCROSE 20 MG/ML IV SOLN
200.0000 mg | Freq: Once | INTRAVENOUS | Status: AC
  Administered 2021-06-05: 200 mg via INTRAVENOUS
  Filled 2021-06-05: qty 10

## 2021-06-05 MED ORDER — SODIUM CHLORIDE 0.9 % IV SOLN
Freq: Once | INTRAVENOUS | Status: AC
Start: 2021-06-05 — End: 2021-06-05
  Filled 2021-06-05: qty 250

## 2021-06-05 MED ORDER — SODIUM CHLORIDE 0.9 % IV SOLN: 200.0000 mg | Freq: Once | INTRAVENOUS | Status: DC

## 2021-06-05 NOTE — Patient Instructions (Signed)

## 2021-06-08 ENCOUNTER — Ambulatory Visit: Payer: Managed Care, Other (non HMO)

## 2021-06-09 ENCOUNTER — Telehealth: Payer: Self-pay | Admitting: Internal Medicine

## 2021-06-09 NOTE — Telephone Encounter (Signed)
Patient wife requested to reschedule lab/md/+/- iron due to vacation.  Appointment rescheduled, confirmed.

## 2021-06-12 ENCOUNTER — Inpatient Hospital Stay: Payer: BC Managed Care – PPO

## 2021-06-12 VITALS — BP 131/75 | HR 64 | Temp 97.6°F | Resp 18

## 2021-06-12 DIAGNOSIS — D649 Anemia, unspecified: Secondary | ICD-10-CM

## 2021-06-12 MED ORDER — SODIUM CHLORIDE 0.9 % IV SOLN: 200.0000 mg | Freq: Once | INTRAVENOUS | Status: DC

## 2021-06-12 MED ORDER — SODIUM CHLORIDE 0.9 % IV SOLN
Freq: Once | INTRAVENOUS | Status: AC
Start: 2021-06-12 — End: 2021-06-12
  Filled 2021-06-12: qty 250

## 2021-06-12 MED ORDER — IRON SUCROSE 20 MG/ML IV SOLN
200.0000 mg | Freq: Once | INTRAVENOUS | Status: AC
  Administered 2021-06-12: 200 mg via INTRAVENOUS
  Filled 2021-06-12: qty 10

## 2021-06-12 NOTE — Patient Instructions (Signed)

## 2021-06-15 ENCOUNTER — Ambulatory Visit: Payer: Managed Care, Other (non HMO)

## 2021-06-19 ENCOUNTER — Inpatient Hospital Stay: Payer: BC Managed Care – PPO | Attending: Internal Medicine

## 2021-06-19 VITALS — BP 145/75 | HR 58 | Temp 97.0°F | Resp 19

## 2021-06-19 DIAGNOSIS — D649 Anemia, unspecified: Secondary | ICD-10-CM | POA: Insufficient documentation

## 2021-06-19 MED ORDER — SODIUM CHLORIDE 0.9 % IV SOLN: 200.0000 mg | Freq: Once | INTRAVENOUS | Status: DC

## 2021-06-19 MED ORDER — SODIUM CHLORIDE 0.9 % IV SOLN
Freq: Once | INTRAVENOUS | Status: AC
  Filled 2021-06-19: qty 250

## 2021-06-19 MED ORDER — IRON SUCROSE 20 MG/ML IV SOLN
200.0000 mg | Freq: Once | INTRAVENOUS | Status: AC
  Administered 2021-06-19: 200 mg via INTRAVENOUS
  Filled 2021-06-19: qty 10

## 2021-06-19 NOTE — Patient Instructions (Signed)
[facility]  Discharge Instructions: Thank you for choosing [facility] to provide your oncology and hematology care.  If you have a lab appointment with the [facility], please go directly to the [facility] and check in at the registration area.  Wear comfortable clothing and clothing appropriate for easy access to any Portacath or PICC line.   We strive to give you quality time with your provider. You may need to reschedule your appointment if you arrive late (15 or more minutes).  Arriving late affects you and other patients whose appointments are after yours.  Also, if you miss three or more appointments without notifying the office, you may be dismissed from the clinic at the provider's discretion.      For prescription refill requests, have your pharmacy contact our office and allow 72 hours for refills to be completed.    Today you received the following chemotherapy and/or immunotherapy agents venofer   To help prevent nausea and vomiting after your treatment, we encourage you to take your nausea medication as directed.  BELOW ARE SYMPTOMS THAT SHOULD BE REPORTED IMMEDIATELY: *FEVER GREATER THAN 100.4 F (38 C) OR HIGHER *CHILLS OR SWEATING *NAUSEA AND VOMITING THAT IS NOT CONTROLLED WITH YOUR NAUSEA MEDICATION *UNUSUAL SHORTNESS OF BREATH *UNUSUAL BRUISING OR BLEEDING *URINARY PROBLEMS (pain or burning when urinating, or frequent urination) *BOWEL PROBLEMS (unusual diarrhea, constipation, pain near the anus) TENDERNESS IN MOUTH AND THROAT WITH OR WITHOUT PRESENCE OF ULCERS (sore throat, sores in mouth, or a toothache) UNUSUAL RASH, SWELLING OR PAIN  UNUSUAL VAGINAL DISCHARGE OR ITCHING   Items with * indicate a potential emergency and should be followed up as soon as possible or go to the Emergency Department if any problems should occur.  Please show the CHEMOTHERAPY ALERT CARD or IMMUNOTHERAPY ALERT CARD at check-in to the  Emergency Department and triage nurse.  Should you have questions after your visit or need to cancel or reschedule your appointment, please contact [facility]  [phone] and follow the prompts.  Office hours are 8:00 a.m. to 4:30 p.m. Monday - Friday. Please note that voicemails left after 4:00 p.m. may not be returned until the following business day.  We are closed weekends and major holidays. You have access to a nurse at all times for urgent questions. Please call the main number to the clinic [phone] and follow the prompts.  For any non-urgent questions, you may also contact your provider using MyChart. We now offer e-Visits for anyone 106 and older to request care online for non-urgent symptoms. For details visit [email or web address].   Also download the MyChart app! Go to the app store, search "MyChart", open the app, select [facility], and log in with your MyChart username and password.  Due to Covid, a mask is required upon entering the hospital/clinic. If you do not have a mask, one will be given to you upon arrival. For doctor visits, patients may have 1 support person aged 59 or older with them. For treatment visits, patients cannot have anyone with them due to current Covid guidelines and our immunocompromised population.

## 2021-06-22 ENCOUNTER — Ambulatory Visit: Payer: Managed Care, Other (non HMO)

## 2021-06-29 ENCOUNTER — Other Ambulatory Visit: Payer: Self-pay | Admitting: Nephrology

## 2021-06-29 ENCOUNTER — Other Ambulatory Visit (HOSPITAL_COMMUNITY): Payer: Self-pay | Admitting: Nephrology

## 2021-06-29 DIAGNOSIS — D638 Anemia in other chronic diseases classified elsewhere: Secondary | ICD-10-CM

## 2021-06-29 DIAGNOSIS — E1122 Type 2 diabetes mellitus with diabetic chronic kidney disease: Secondary | ICD-10-CM

## 2021-06-29 DIAGNOSIS — N170 Acute kidney failure with tubular necrosis: Secondary | ICD-10-CM

## 2021-06-29 DIAGNOSIS — E1129 Type 2 diabetes mellitus with other diabetic kidney complication: Secondary | ICD-10-CM

## 2021-06-29 DIAGNOSIS — E559 Vitamin D deficiency, unspecified: Secondary | ICD-10-CM

## 2021-06-29 DIAGNOSIS — R809 Proteinuria, unspecified: Secondary | ICD-10-CM

## 2021-06-30 ENCOUNTER — Encounter: Payer: Self-pay | Admitting: Internal Medicine

## 2021-06-30 ENCOUNTER — Ambulatory Visit (INDEPENDENT_AMBULATORY_CARE_PROVIDER_SITE_OTHER): Payer: BC Managed Care – PPO | Admitting: Urology

## 2021-06-30 ENCOUNTER — Other Ambulatory Visit: Payer: Self-pay

## 2021-06-30 VITALS — BP 145/71 | HR 66

## 2021-06-30 DIAGNOSIS — N5201 Erectile dysfunction due to arterial insufficiency: Secondary | ICD-10-CM | POA: Diagnosis not present

## 2021-06-30 DIAGNOSIS — N35010 Post-traumatic urethral stricture, male, meatal: Secondary | ICD-10-CM

## 2021-06-30 DIAGNOSIS — R339 Retention of urine, unspecified: Secondary | ICD-10-CM

## 2021-06-30 DIAGNOSIS — N486 Induration penis plastica: Secondary | ICD-10-CM | POA: Diagnosis not present

## 2021-06-30 DIAGNOSIS — N400 Enlarged prostate without lower urinary tract symptoms: Secondary | ICD-10-CM

## 2021-06-30 LAB — MICROSCOPIC EXAMINATION
Bacteria, UA: NONE SEEN
Epithelial Cells (non renal): NONE SEEN /hpf (ref 0–10)
RBC, Urine: NONE SEEN /hpf (ref 0–2)
Renal Epithel, UA: NONE SEEN /hpf
WBC, UA: NONE SEEN /hpf (ref 0–5)

## 2021-06-30 LAB — URINALYSIS, ROUTINE W REFLEX MICROSCOPIC
Bilirubin, UA: NEGATIVE
Ketones, UA: NEGATIVE
Leukocytes,UA: NEGATIVE
Nitrite, UA: NEGATIVE
Specific Gravity, UA: 1.02 (ref 1.005–1.030)
Urobilinogen, Ur: 0.2 mg/dL (ref 0.2–1.0)
pH, UA: 6.5 (ref 5.0–7.5)

## 2021-06-30 NOTE — Progress Notes (Signed)
Urological Symptom Review  Patient is experiencing the following symptoms: Get up at night to urinate Leakage of urine Stream starts and stops Trouble starting stream Weak stream Erection problems (male only)   Review of Systems  Gastrointestinal (upper)  : Nausea Indigestion/heartburn  Gastrointestinal (lower) : Diarrhea Constipation  Constitutional : Weight loss Fatigue  Skin: Negative for skin symptoms  Eyes: Negative for eye symptoms  Ear/Nose/Throat : Negative for Ear/Nose/Throat symptoms  Hematologic/Lymphatic: Negative for Hematologic/Lymphatic symptoms  Cardiovascular : Negative for cardiovascular symptoms  Respiratory : Negative for respiratory symptoms  Endocrine: Negative for endocrine symptoms  Musculoskeletal: Negative for musculoskeletal symptoms  Neurological: Negative for neurological symptoms  Psychologic: Anxiety

## 2021-06-30 NOTE — Progress Notes (Signed)
H&P  Chief Complaint: Urinary difficulties  History of Present Illness: 60 year old with history of diabetes, GERD, celiac disease, chronic kidney disease comes in today for urinary difficulties.  For the past 1 to 2 years at least he has had problems with slow stream, feeling of incomplete emptying, nocturia x3, straining to urinate, hesitancy.  No significant leakage.  He has never been treated for urinary tract infection.  Tamsulosin for some time.  He does take 2 capsules a day.  Despite this, he still has significant symptomatology.  He does have a history of a meatotomy 20 years ago.  He still has significant spraying.  He also has problems obtaining and maintaining erections.  Maximum doses of PDE 5 and help.  He also states that he has had some curvature of his penis with erections.  The curvature is leftward.  It sounds like in the past he has had a penile injury during intercourse.  His last penetration was 2 years ago.  IPSS 27 Quality-of-life score 6  Past Medical History:  Diagnosis Date   Adult celiac disease    Anemia    BPH (benign prostatic hyperplasia)    DM (diabetes mellitus) (HCC)    Type II   GERD (gastroesophageal reflux disease)    HTN (hypertension)    Hyperlipidemia    Thoracic spondylosis without myelopathy 11/22/2016    Past Surgical History:  Procedure Laterality Date   COLONOSCOPY  2003   Dr. Laural Golden: two small polyps, small ext hemorrhoids.path:focal adenomatous changes   COLONOSCOPY N/A 05/03/2014   Procedure: COLONOSCOPY;  Surgeon: Danie Binder, MD;  [facility];  Service: Endoscopy;  Laterality: N/A;  12:00   ESOPHAGOGASTRODUODENOSCOPY N/A 05/03/2014   Procedure: ESOPHAGOGASTRODUODENOSCOPY (EGD);  Surgeon: Danie Binder, MD;  [facility];  Service: Endoscopy;  Laterality: N/A;   ESOPHAGOGASTRODUODENOSCOPY (EGD) WITH PROPOFOL N/A 06/03/2020   Procedure: ESOPHAGOGASTRODUODENOSCOPY (EGD) WITH PROPOFOL;  Surgeon: [name], MD;  Location: [facility];  Service: Endoscopy;  Laterality: N/A;   PARS PLANA VITRECTOMY Right 03/21/2021   Procedure: PARS PLANA VITRECTOMY 25 GAUGE WITH INJECTION OF ANTIBIOTICS FOR ENDOPHTHALMITIS;  Surgeon: Jalene Mullet, MD;  Location: [place];  Service: Ophthalmology;  Laterality: Right;    Home Medications:  Allergies as of 06/30/2021       Reactions   Duloxetine Other (See Comments)   "disoriented, confused, couldn't drive" Other reaction(s): Other (see comments) "disoriented, confused, couldn't drive" "disoriented, confused, couldn't drive" "disoriented, confused, couldn't drive"   Codeine Rash        Medication List        Accurate as of June 30, 2021  1:36 PM. If you have any questions, ask your nurse or doctor.          acetaminophen 650 MG CR tablet Commonly known as: TYLENOL Take 1,300 mg by mouth as needed for pain.   aspirin 81 MG EC tablet Take 81 mg by mouth daily.   atorvastatin 40 MG tablet Commonly known as: LIPITOR Take 1 tablet (40 mg total) by mouth daily. What changed: when to take this   B COMPLEX PO Take 1 tablet by mouth daily.   gabapentin 300 MG capsule Commonly known as: NEURONTIN Take 3 capsules by mouth in the morning, at noon, and at bedtime.   glipiZIDE 5 MG tablet Commonly known as: GLUCOTROL Take 1 tablet (5 mg total) by mouth 2 (two) times daily before a meal. What changed: when to take this   hydrochlorothiazide  12.5 MG tablet Commonly known as: HYDRODIURIL Take 2 tablets by mouth daily.   Jardiance 25 MG Tabs tablet Generic drug: empagliflozin Take 25 mg by mouth every evening.   Linzess 145 MCG Caps capsule Generic drug: linaclotide Take 145 mcg by mouth daily as needed for constipation.   lisinopril 2.5 MG tablet Commonly known as: ZESTRIL Take 2.5 mg by mouth at bedtime.   ofloxacin 0.3 % ophthalmic solution Commonly known as: OCUFLOX Place 1 drop into the right eye 4 (four) times daily.    Ozempic (1 MG/DOSE) 4 MG/3ML Sopn Generic drug: Semaglutide (1 MG/DOSE) Inject 1 mg into the skin once a week.   pantoprazole 40 MG tablet Commonly known as: PROTONIX Take 40 mg by mouth every evening.   tamsulosin 0.4 MG Caps capsule Commonly known as: FLOMAX Take 1 capsule (0.4 mg total) by mouth daily after supper.   vitamin C 1000 MG tablet Take 1,000 mg by mouth daily.   VITAMIN D3 PO Take 1 capsule by mouth daily.        Allergies:  Allergies  Allergen Reactions   Duloxetine Other (See Comments)    "disoriented, confused, couldn't drive" Other reaction(s): Other (see comments) "disoriented, confused, couldn't drive" "disoriented, confused, couldn't drive" "disoriented, confused, couldn't drive"   Codeine Rash    Family History  Problem Relation Age of Onset   Aneurysm Mother        brain   Hypertension Mother    Cancer Father    Heart disease Father    Diabetes Father    Hypertension Father    Hyperlipidemia Father    Cancer Maternal Grandmother        melanoma   Heart disease Paternal Grandfather    Colon cancer Neg Hx     Social History:  reports that he has never smoked. His smokeless tobacco use includes snuff. He reports that he does not currently use alcohol. He reports that he does not use drugs.  ROS: A complete review of systems was performed.  All systems are negative except for pertinent findings as noted.  Physical Exam:  Vital signs in last 24 hours: BP (!) 145/71   Pulse 66  Constitutional:  Alert and oriented, No acute distress Cardiovascular: Regular rate  Respiratory: Normal respiratory effort GI: Abdomen is soft, nontender, nondistended, no abdominal masses. No CVAT.  Genitourinary: Normal male phallus, testes are descended bilaterally and non-tender and without masses, scrotum is normal in appearance without lesions or masses, perineum is normal on inspection.  Prostate 30 g in size.  Symmetrical, nonnodular, tender.  Mild  plaques along the dorsal penis.  There is mild to moderate meatal stenosis Lymphatic: No lymphadenopathy Neurologic: Grossly intact, no focal deficits Psychiatric: Normal mood and affect  I have reviewed prior pt notes  I have reviewed notes from referring/previous physicians  I have reviewed urinalysis results  I have reviewed prior PSA results  Bladder scan volume 150 mL   Impression/Assessment:  1.  Lower urinary tract symptoms.  His gland is not palpably large, he does not have significant symptomatic relief from maximum dose of tamsulosin  2.  ED, not responsive to PDE 5 inhibitors  3.  Possible Peyronie's disease  Plan:  1.  For now, continue tamsulosin  2.  I will bring him back next available for cystoscopy.  3.  I discussed prostaglandin injections with him-we will visit this down the road

## 2021-07-07 ENCOUNTER — Ambulatory Visit (HOSPITAL_COMMUNITY)
Admission: RE | Admit: 2021-07-07 | Discharge: 2021-07-07 | Disposition: A | Discharge status: Home / Self Care | Payer: BC Managed Care – PPO | Source: Ambulatory Visit | Attending: Nephrology | Admitting: Nephrology

## 2021-07-07 ENCOUNTER — Other Ambulatory Visit: Payer: Self-pay

## 2021-07-07 DIAGNOSIS — N170 Acute kidney failure with tubular necrosis: Secondary | ICD-10-CM | POA: Insufficient documentation

## 2021-07-07 DIAGNOSIS — R809 Proteinuria, unspecified: Secondary | ICD-10-CM | POA: Insufficient documentation

## 2021-07-07 DIAGNOSIS — E559 Vitamin D deficiency, unspecified: Secondary | ICD-10-CM | POA: Diagnosis present

## 2021-07-07 DIAGNOSIS — E1122 Type 2 diabetes mellitus with diabetic chronic kidney disease: Secondary | ICD-10-CM | POA: Diagnosis present

## 2021-07-07 DIAGNOSIS — E1129 Type 2 diabetes mellitus with other diabetic kidney complication: Secondary | ICD-10-CM | POA: Insufficient documentation

## 2021-07-07 DIAGNOSIS — D638 Anemia in other chronic diseases classified elsewhere: Secondary | ICD-10-CM | POA: Diagnosis present

## 2021-07-08 ENCOUNTER — Other Ambulatory Visit: Payer: Self-pay

## 2021-07-08 ENCOUNTER — Inpatient Hospital Stay (HOSPITAL_BASED_OUTPATIENT_CLINIC_OR_DEPARTMENT_OTHER): Payer: BC Managed Care – PPO | Admitting: Internal Medicine

## 2021-07-08 ENCOUNTER — Inpatient Hospital Stay: Payer: BC Managed Care – PPO

## 2021-07-08 ENCOUNTER — Encounter: Payer: Self-pay | Admitting: Internal Medicine

## 2021-07-08 DIAGNOSIS — D649 Anemia, unspecified: Secondary | ICD-10-CM

## 2021-07-08 LAB — BASIC METABOLIC PANEL
Anion gap: 5 (ref 5–15)
BUN: 30 mg/dL — ABNORMAL HIGH (ref 6–20)
CO2: 25 mmol/L (ref 22–32)
Calcium: 9.7 mg/dL (ref 8.9–10.3)
Chloride: 104 mmol/L (ref 98–111)
Creatinine, Ser: 2.17 mg/dL — ABNORMAL HIGH (ref 0.61–1.24)
GFR, Estimated: 34 mL/min — ABNORMAL LOW (ref >60)
Glucose, Bld: 233 mg/dL — ABNORMAL HIGH (ref 70–99)
Potassium: 4.4 mmol/L (ref 3.5–5.1)
Sodium: 134 mmol/L — ABNORMAL LOW (ref 135–145)

## 2021-07-08 LAB — CBC WITH DIFFERENTIAL/PLATELET
Abs Immature Granulocytes: 0.03 10*3/uL (ref 0.00–0.07)
Basophils Absolute: 0 10*3/uL (ref 0.0–0.1)
Basophils Relative: 1 %
Eosinophils Absolute: 0.4 10*3/uL (ref 0.0–0.5)
Eosinophils Relative: 8 %
HCT: 31.7 % — ABNORMAL LOW (ref 39.0–52.0)
Hemoglobin: 10.4 g/dL — ABNORMAL LOW (ref 13.0–17.0)
Immature Granulocytes: 1 %
Lymphocytes Relative: 16 %
Lymphs Abs: 0.8 10*3/uL (ref 0.7–4.0)
MCH: 30 pg (ref 26.0–34.0)
MCHC: 32.8 g/dL (ref 30.0–36.0)
MCV: 91.4 fL (ref 80.0–100.0)
Monocytes Absolute: 0.5 10*3/uL (ref 0.1–1.0)
Monocytes Relative: 10 %
Neutro Abs: 3.1 10*3/uL (ref 1.7–7.7)
Neutrophils Relative %: 64 %
Platelets: 201 10*3/uL (ref 150–400)
RBC: 3.47 MIL/uL — ABNORMAL LOW (ref 4.22–5.81)
RDW: 13.3 % (ref 11.5–15.5)
WBC: 4.8 10*3/uL (ref 4.0–10.5)
nRBC: 0 % (ref 0.0–0.2)

## 2021-07-08 NOTE — Assessment & Plan Note (Addendum)
#  Anemia-symptomatic fatigue hemoglobin 9-10-unclear etiology; however suspect multifactorial-iron malabsorption/celiac disease/CKD-stage III.  No significant improvement in hemoglobin/symptoms.  For the referral.  # Ckd stage III [GFR]-diabetes versus other causes.  Recommend follow-up with PCP/nephrology evaluation CT scan 2021 negative for any hydronephrosis.  # Diabtes [[facility] Hb A1c- 6.2]-continue follow-up with endocrinology/monitoring of blood sugars.  # ?  History of celiac disease-defer to [facility]  # DISPOSITION:mychatr # no venofer today # follow up in 3 months- MD; labs- cbc/bmp;Iorn studies/ferritin;possible venofer -Dr.B

## 2021-07-08 NOTE — Progress Notes (Signed)
[place] CONSULT NOTE  Patient Care Team: Dion Body, MD as PCP - General (Family Medicine) [name], NP as Nurse Practitioner (Endocrinology)  CHIEF COMPLAINTS/PURPOSE OF CONSULTATION: ANEMIA   HEMATOLOGY HISTORY:  # ANEMIA PROGRESSIVE since 2020 12; 2022- 10 MCV- 90s; colonoscopy 2015; multiple endoscopies-last September 2021-biopsy active celiac disease   #CKD stage III/diabetes [[facility]]; ? Celaic disease [[facility]]; -----------------------------------------------------------------------------------------------------------------------------------------------------------------------  - Labs: 07/2014 - tTG IgA 101 03/2020 - positive endomysial Ab IgA, tTG IgA 78 - CSY: 07/2014 - normal appearing terminal ileum, sigmoid diverticulosis, and non-bleeding internal hemorrhoids - EGD: 07/2014 - mild non-erosive gastritis, normal appearing duodenal mucosa with duodenal biopsies showing celiac sprue - EGD: 06/03/2020 - procedure aborted due to presence of food - EGD: 08/12/2020 - irregular Z-line found at [facility] with bx showing mild reflux esophagitis, 1 cm hiatal hernia, mild chronic gastritis, decreased folds found in entire duodenum, flattening found in entire duodenum and scalloped mucosa with bx showing active Celiac disease with villous atrophy, increased intraepithelial lymphocytes, etc. Repeat in 1-year.  HISTORY OF PRESENTING ILLNESS:  Brian Nielsen 60 y.o.  male symptomatic anemia-is here for follow-up.  Patient s/p iron infusion no significant improvement noted.  Continues with fatigue.   Otherwise no nausea no vomiting.  No fevers or chills.   Review of Systems  Constitutional:  Positive for malaise/fatigue and weight loss. Negative for chills, diaphoresis and fever.  HENT:  Negative for nosebleeds and sore throat.   Eyes:  Negative for double vision.  Respiratory:  Positive for shortness of breath. Negative for cough, hemoptysis, sputum  production and wheezing.   Cardiovascular:  Negative for chest pain, palpitations, orthopnea and leg swelling.  Gastrointestinal:  Positive for abdominal pain, constipation and diarrhea. Negative for blood in stool, heartburn, melena, nausea and vomiting.  Genitourinary:  Negative for dysuria, frequency and urgency.  Musculoskeletal:  Positive for back pain and joint pain.  Skin: Negative.  Negative for itching and rash.  Neurological:  Positive for weakness. Negative for dizziness, tingling, focal weakness and headaches.  Endo/Heme/Allergies:  Does not bruise/bleed easily.  Psychiatric/Behavioral:  Negative for depression. The patient is not nervous/anxious and does not have insomnia.    MEDICAL HISTORY:  Past Medical History:  Diagnosis Date   Adult celiac disease    Anemia    BPH (benign prostatic hyperplasia)    DM (diabetes mellitus) (HCC)    Type II   GERD (gastroesophageal reflux disease)    HTN (hypertension)    Hyperlipidemia    Thoracic spondylosis without myelopathy 11/22/2016    SURGICAL HISTORY: Past Surgical History:  Procedure Laterality Date   COLONOSCOPY  2003   Dr. Laural Golden: two small polyps, small ext hemorrhoids.path:focal adenomatous changes   COLONOSCOPY N/A 05/03/2014   Procedure: COLONOSCOPY;  Surgeon: Danie Binder, MD;  [facility];  Service: Endoscopy;  Laterality: N/A;  12:00   ESOPHAGOGASTRODUODENOSCOPY N/A 05/03/2014   Procedure: ESOPHAGOGASTRODUODENOSCOPY (EGD);  Surgeon: Danie Binder, MD;  [facility];  Service: Endoscopy;  Laterality: N/A;   ESOPHAGOGASTRODUODENOSCOPY (EGD) WITH PROPOFOL N/A 06/03/2020   Procedure: ESOPHAGOGASTRODUODENOSCOPY (EGD) WITH PROPOFOL;  Surgeon: [name], MD;  Location: [facility];  Service: Endoscopy;  Laterality: N/A;   PARS PLANA VITRECTOMY Right 03/21/2021   Procedure: PARS PLANA VITRECTOMY 25 GAUGE WITH INJECTION OF ANTIBIOTICS FOR ENDOPHTHALMITIS;  Surgeon: Jalene Mullet, MD;   Location: [place];  Service: Ophthalmology;  Laterality: Right;    SOCIAL HISTORY: Social History   Socioeconomic History  Marital status: Married    Spouse name: [name]   Number of children: 1   Years of education: 12   Highest education level: Not on file  Occupational History   Occupation: Programmer, systems: [employer]    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  Tobacco Use   Smoking status: Never   Smokeless tobacco: Current    Types: Snuff   Tobacco comments:    Quit x 30 years  Vaping Use   Vaping Use: Never used  Substance and Sexual Activity   Alcohol use: Not Currently    Comment: occasionally on weekends   Drug use: No   Sexual activity: Yes    Birth control/protection: Surgical  Other Topics Concern   Not on file  Social History Narrative   Lives with wife   [place]   Caffeine- diet Mtn Dew, 2 glasses   Social Determinants of Health   Financial Resource Strain: Not on file  Food Insecurity: Not on file  Transportation Needs: Not on file  Physical Activity: Not on file  Stress: Not on file  Social Connections: Not on file  Intimate Partner Violence: Not on file    FAMILY HISTORY: Family History  Problem Relation Age of Onset   Aneurysm Mother        brain   Hypertension Mother    Cancer Father    Heart disease Father    Diabetes Father    Hypertension Father    Hyperlipidemia Father    Cancer Maternal Grandmother        melanoma   Heart disease Paternal Grandfather    Colon cancer Neg Hx     ALLERGIES:  is allergic to duloxetine and codeine.  MEDICATIONS:  Current Outpatient Medications  Medication Sig Dispense Refill   Ascorbic Acid (VITAMIN C) 1000 MG tablet Take 1,000 mg by mouth daily.     aspirin 81 MG EC tablet Take 81 mg by mouth daily.     atorvastatin (LIPITOR) 40 MG tablet Take 1 tablet (40 mg total) by mouth daily. (Patient taking differently: Take 40 mg by mouth every evening.) 90 tablet 3    B Complex Vitamins (B COMPLEX PO) Take 1 tablet by mouth daily.     Cholecalciferol (VITAMIN D3 PO) Take 1 capsule by mouth daily.     gabapentin (NEURONTIN) 300 MG capsule Take 3 capsules by mouth in the morning, at noon, and at bedtime.     glipiZIDE (GLUCOTROL) 5 MG tablet Take 1 tablet (5 mg total) by mouth 2 (two) times daily before a meal. (Patient taking differently: Take 5 mg by mouth daily before breakfast.) 60 tablet 0   hydrochlorothiazide (HYDRODIURIL) 12.5 MG tablet Take 2 tablets by mouth daily. (Patient not taking: Reported on 08/03/2021)     JARDIANCE 25 MG TABS tablet Take 25 mg by mouth every evening.     LINZESS 145 MCG CAPS capsule Take 145 mcg by mouth daily as needed for constipation.     lisinopril (ZESTRIL) 2.5 MG tablet Take 2.5 mg by mouth at bedtime.     ofloxacin (OCUFLOX) 0.3 % ophthalmic solution Place 1 drop into the right eye 4 (four) times daily.     OZEMPIC, 1 MG/DOSE, 4 MG/3ML SOPN Inject 1 mg into the skin once a week.     pantoprazole (PROTONIX) 40 MG tablet Take 40 mg by mouth every evening.     tamsulosin (FLOMAX) 0.4 MG CAPS capsule Take 1 capsule (0.4  mg total) by mouth 2 (two) times daily. 60 capsule 1   No current facility-administered medications for this visit.      PHYSICAL EXAMINATION:   Vitals:   07/08/21 1500  BP: 115/66  Pulse: 67  Resp: 18  Temp: 97.6 F (36.4 C)  SpO2: 96%   There were no vitals filed for this visit.  Physical Exam Vitals and nursing note reviewed.  Constitutional:      Comments: Ambulating: independently   with wife.   HENT:     Head: Normocephalic and atraumatic.     Mouth/Throat:     Pharynx: Oropharynx is clear.  Eyes:     Extraocular Movements: Extraocular movements intact.     Pupils: Pupils are equal, round, and reactive to light.  Cardiovascular:     Rate and Rhythm: Normal rate and regular rhythm.  Pulmonary:     Comments: Decreased breath sounds bilaterally.  Abdominal:     Palpations:  Abdomen is soft.  Musculoskeletal:        General: Normal range of motion.     Cervical back: Normal range of motion.  Skin:    General: Skin is warm.  Neurological:     General: No focal deficit present.     Mental Status: He is alert and oriented to person, place, and time.  Psychiatric:        Behavior: Behavior normal.        Judgment: Judgment normal.    LABORATORY DATA:  I have reviewed the data as listed Lab Results  Component Value Date   WBC 4.8 07/08/2021   HGB 10.4 (L) 07/08/2021   HCT 31.7 (L) 07/08/2021   MCV 91.4 07/08/2021   PLT 201 07/08/2021   Recent Labs    05/12/21 1650 05/28/21 1240 07/08/21 1444  NA 141 139 134*  K 4.5 4.0 4.4  CL 115* 103 104  CO2 24 28 25   GLUCOSE 144* 153* 233*  BUN 24* 34* 30*  CREATININE 1.31* 1.84* 2.17*  CALCIUM 8.4* 9.8 9.7  GFRNONAA >60 42* 34*  PROT  --  7.2  --   ALBUMIN  --  3.8  --   AST  --  26  --   ALT  --  27  --   ALKPHOS  --  85  --   BILITOT  --  0.6  --      No results found.  Symptomatic anemia #Anemia-symptomatic fatigue hemoglobin 9-10-unclear etiology; however suspect multifactorial-iron malabsorption/celiac disease/CKD-stage III.  No significant improvement in hemoglobin/symptoms.  For the referral.  # Ckd stage III [GFR]-diabetes versus other causes.  Recommend follow-up with PCP/nephrology evaluation CT scan 2021 negative for any hydronephrosis.  # Diabtes [[facility] Hb A1c- 6.2]-continue follow-up with endocrinology/monitoring of blood sugars.  # ?  History of celiac disease-defer to [facility]  # DISPOSITION:mychatr # no venofer today # follow up in 3 months- MD; labs- cbc/bmp;Iorn studies/ferritin;possible venofer -Dr.B     All questions were answered. The patient knows to call the clinic with any problems, questions or concerns.      Cammie Sickle, MD 08/12/2021 1:01 PM

## 2021-07-10 ENCOUNTER — Telehealth: Payer: Self-pay

## 2021-07-10 NOTE — Telephone Encounter (Signed)
Message sent to MD

## 2021-07-13 ENCOUNTER — Ambulatory Visit: Payer: Managed Care, Other (non HMO) | Admitting: Internal Medicine

## 2021-07-13 ENCOUNTER — Ambulatory Visit: Payer: Managed Care, Other (non HMO)

## 2021-07-13 ENCOUNTER — Other Ambulatory Visit: Payer: Managed Care, Other (non HMO)

## 2021-07-14 ENCOUNTER — Other Ambulatory Visit: Payer: Self-pay

## 2021-07-14 MED ORDER — TAMSULOSIN HCL 0.4 MG PO CAPS: 0.4000 mg | ORAL_CAPSULE | Freq: Two times a day (BID) | ORAL | 1 refills | Status: DC

## 2021-07-14 NOTE — Telephone Encounter (Signed)
Prescription updated and patient notified.

## 2021-08-03 ENCOUNTER — Ambulatory Visit (INDEPENDENT_AMBULATORY_CARE_PROVIDER_SITE_OTHER): Payer: BC Managed Care – PPO | Admitting: Gastroenterology

## 2021-08-03 ENCOUNTER — Other Ambulatory Visit: Payer: Self-pay

## 2021-08-03 ENCOUNTER — Encounter: Payer: Self-pay | Admitting: Gastroenterology

## 2021-08-03 VITALS — BP 155/70 | HR 74 | Temp 98.3°F | Ht 68.0 in | Wt 185.8 lb

## 2021-08-03 DIAGNOSIS — R634 Abnormal weight loss: Secondary | ICD-10-CM | POA: Diagnosis not present

## 2021-08-03 DIAGNOSIS — K900 Celiac disease: Secondary | ICD-10-CM

## 2021-08-03 NOTE — Progress Notes (Addendum)
Gastroenterology Consultation  Referring Provider:     Dion Body, MD Primary Care Physician:  Dion Body, MD Primary Gastroenterologist:  Dr. Allen Norris     Reason for Consultation:     Multiple GI issues        HPI:   Brian Nielsen is a 60 y.o. y/o male referred for consultation & management of Multiple GI issues by Dr. Dion Body, MD.  This patient comes in today after being seen in the past by multiple GI doctors including Dr. Oneida Alar, Dr. Haig Prophet, Dr. Corbin Ade and most recently by the PA at [facility] clinic.  The patient had a gastric emptying study done back in 2003 that showed delayed gastric emptying. He also has a history of long-standing diabetes with renal failure and multiple upper endoscopies that show retained food. The patient also reports that he has been told for many years that he has celiac sprue and he states he is on a completely gluten-free diet.  Despite that the patient still reports that he has multiple GI problems.  The patient had a small intestinal bacterial overgrowth breath test that was negative.  He reports that he was started on Creon and takes Linzess for his constipation. He reports that the( will sometimes work and then other times he will take 2 of them and they will not work. The patient reports that he has lost 100 pounds in the last 12 months without trying. The patient was lasts in contact with the [facility] clinic on August 11 of this year just a little over a month ago. The patient reports that he is now seeing me today because he feels like he is not being helped by his previous GI practice.  Past Medical History:  Diagnosis Date   Adult celiac disease    Anemia    BPH (benign prostatic hyperplasia)    DM (diabetes mellitus) (HCC)    Type II   GERD (gastroesophageal reflux disease)    HTN (hypertension)    Hyperlipidemia    Thoracic spondylosis without myelopathy 11/22/2016    Past Surgical History:  Procedure Laterality Date    COLONOSCOPY  2003   Dr. Laural Golden: two small polyps, small ext hemorrhoids.path:focal adenomatous changes   COLONOSCOPY N/A 05/03/2014   Procedure: COLONOSCOPY;  Surgeon: Danie Binder, MD;  [facility];  Service: Endoscopy;  Laterality: N/A;  12:00   ESOPHAGOGASTRODUODENOSCOPY N/A 05/03/2014   Procedure: ESOPHAGOGASTRODUODENOSCOPY (EGD);  Surgeon: Danie Binder, MD;  [facility];  Service: Endoscopy;  Laterality: N/A;   ESOPHAGOGASTRODUODENOSCOPY (EGD) WITH PROPOFOL N/A 06/03/2020   Procedure: ESOPHAGOGASTRODUODENOSCOPY (EGD) WITH PROPOFOL;  Surgeon: [name], MD;  Location: [facility];  Service: Endoscopy;  Laterality: N/A;   PARS PLANA VITRECTOMY Right 03/21/2021   Procedure: PARS PLANA VITRECTOMY 25 GAUGE WITH INJECTION OF ANTIBIOTICS FOR ENDOPHTHALMITIS;  Surgeon: Jalene Mullet, MD;  Location: [place];  Service: Ophthalmology;  Laterality: Right;    Prior to Admission medications   Medication Sig Start Date End Date Taking? Authorizing Provider  Ascorbic Acid (VITAMIN C) 1000 MG tablet Take 1,000 mg by mouth daily.   Yes [provider]  aspirin 81 MG EC tablet Take 81 mg by mouth daily.   Yes [provider]  atorvastatin (LIPITOR) 40 MG tablet Take 1 tablet (40 mg total) by mouth daily. Patient taking differently: Take 40 mg by mouth every evening. 10/21/16  Yes Raylene Everts, MD  B Complex Vitamins (B COMPLEX PO) Take 1  tablet by mouth daily.   Yes [provider]  Cholecalciferol (VITAMIN D3 PO) Take 1 capsule by mouth daily.   Yes [provider]  gabapentin (NEURONTIN) 300 MG capsule Take 3 capsules by mouth in the morning, at noon, and at bedtime. 05/20/21  Yes [provider]  glipiZIDE (GLUCOTROL) 5 MG tablet Take 1 tablet (5 mg total) by mouth 2 (two) times daily before a meal. Patient taking differently: Take 5 mg by mouth daily before breakfast. 01/12/17  Yes Raylene Everts, MD  JARDIANCE 25 MG  TABS tablet Take 25 mg by mouth every evening. 09/08/19  Yes [provider]  LINZESS 145 MCG CAPS capsule Take 145 mcg by mouth daily as needed for constipation. 11/12/20  Yes [provider]  lisinopril (ZESTRIL) 2.5 MG tablet Take 2.5 mg by mouth at bedtime. 11/10/20  Yes [provider]  ofloxacin (OCUFLOX) 0.3 % ophthalmic solution Place 1 drop into the right eye 4 (four) times daily. 05/22/21  Yes [provider]  OZEMPIC, 1 MG/DOSE, 4 MG/3ML SOPN Inject 1 mg into the skin once a week. 11/03/20  Yes [provider]  pantoprazole (PROTONIX) 40 MG tablet Take 40 mg by mouth every evening. 09/08/19  Yes [provider]  tamsulosin (FLOMAX) 0.4 MG CAPS capsule Take 1 capsule (0.4 mg total) by mouth 2 (two) times daily. 07/14/21  Yes Dahlstedt, Annie Main, MD  hydrochlorothiazide (HYDRODIURIL) 12.5 MG tablet Take 2 tablets by mouth daily. Patient not taking: Reported on 08/03/2021 05/19/21   [provider]    Family History  Problem Relation Age of Onset   Aneurysm Mother        brain   Hypertension Mother    Cancer Father    Heart disease Father    Diabetes Father    Hypertension Father    Hyperlipidemia Father    Cancer Maternal Grandmother        melanoma   Heart disease Paternal Grandfather    Colon cancer Neg Hx      Social History   Tobacco Use   Smoking status: Never   Smokeless tobacco: Current    Types: Snuff   Tobacco comments:    Quit x 30 years  Vaping Use   Vaping Use: Never used  Substance Use Topics   Alcohol use: Not Currently    Comment: occasionally on weekends   Drug use: No    Allergies as of 08/03/2021 - Review Complete 08/03/2021  Allergen Reaction Noted   Duloxetine Other (See Comments) 12/19/2019   Codeine Rash 03/21/2021    Review of Systems:    All systems reviewed and negative except where noted in HPI.   Physical Exam:  BP (!) 155/70 (BP Location: Left Arm, Patient Position:  Sitting, Cuff Size: Large)   Pulse 74   Temp 98.3 F (36.8 C) (Temporal)   Ht 5' 8"  (1.727 m)   Wt 185 lb 12.8 oz (84.3 kg)   BMI 28.25 kg/m  No LMP for male patient. General:   Alert,  Well-developed, well-nourished, pleasant and cooperative in NAD Head:  Normocephalic and atraumatic. Eyes:  Sclera clear, no icterus.   Conjunctiva pink. Ears:  Normal auditory acuity. Neck:  Supple; no masses or thyromegaly. Lungs:  Respirations even and unlabored.  Clear throughout to auscultation.   No wheezes, crackles, or rhonchi. No acute distress. Heart:  Regular rate and rhythm; no murmurs, clicks, rubs, or gallops. Abdomen:  Normal bowel sounds.  No bruits.  Soft,  non-tender and non-distended without masses, hepatosplenomegaly or hernias noted.  No guarding or rebound tenderness.  Negative Carnett sign.   Rectal:  Deferred.  Pulses:  Normal pulses noted. Extremities:  No clubbing or edema.  No cyanosis. Neurologic:  Alert and oriented x3;  grossly normal neurologically. Skin:  Intact without significant lesions or rashes.  No jaundice. Lymph Nodes:  No significant cervical adenopathy. Psych:  Alert and cooperative. Normal mood and affect.  Imaging Studies: US RENAL  Result Date: 07/08/2021 CLINICAL DATA:  Chronic kidney disease due to type 2 diabetes Acute kidney failure Proteinuria EXAM: RENAL / URINARY TRACT ULTRASOUND COMPLETE COMPARISON:  CT abdomen pelvis 05/29/2020 FINDINGS: Right Kidney: Renal measurements: 12.2 x 6.0 x 6.3 cm = volume: 241 mL. Echogenicity within normal limits. No mass or hydronephrosis visualized. Left Kidney: Renal measurements: 11.6 x 6.3 x 5.8 cm = volume: 220 mL. Echogenicity within normal limits. No mass or hydronephrosis visualized. Bladder: Prevoid volume: 477 mL Postvoid residual: 241 mL Other: None. IMPRESSION: 1. No significant sonographic abnormality of the kidneys. 2. Significant postvoid bladder residual. Electronically Signed   By: Miachel Roux M.D.   On:  07/08/2021 08:35    Assessment and Plan:   Brian Nielsen is a 60 y.o. y/o male who comes in today with a history of gastroparesis documented as far back as a gastric emptying study in 2003. The patient also has a diagnosis of celiac sprue and is being treated with pancreatic enzyme replacement for possible pancreatic insufficiency as reported by the patient. The patient has had multiple EGDs including this year at an outpatient endoscopy center for which I do not have the records.  The patient reports that he has not had a colonoscopy since 2015.  The patient with a weight loss that he is reported to be 100 pounds and a history of celiac sprue, malignancy needs to be ruled out due to the increased risk of small bowel cancers in these patients.  The patient will be set up for a small bowel follow-through and a colonoscopy due to his weight loss and history of celiac sprue.  The patient will also take supplemental fiber to help with his constipation while continuing his present medication.  He will also have his blood sent off for celiac sprue antibodies to see if he is really adhering to a gluten-free diet.  The patient has been explained the plan and agrees with it.    Lucilla Lame, MD. Marval Regal    Note: This dictation was prepared with Dragon dictation along with smaller phrase technology. Any transcriptional errors that result from this process are unintentional.

## 2021-08-06 ENCOUNTER — Encounter: Payer: Self-pay | Admitting: Internal Medicine

## 2021-08-06 ENCOUNTER — Other Ambulatory Visit: Payer: Self-pay

## 2021-08-06 DIAGNOSIS — K900 Celiac disease: Secondary | ICD-10-CM

## 2021-08-06 DIAGNOSIS — R634 Abnormal weight loss: Secondary | ICD-10-CM

## 2021-08-06 MED ORDER — NA SULFATE-K SULFATE-MG SULF 17.5-3.13-1.6 GM/177ML PO SOLN: 1.0000 | Freq: Once | ORAL | 0 refills | Status: AC

## 2021-08-10 ENCOUNTER — Other Ambulatory Visit: Payer: Self-pay

## 2021-08-12 ENCOUNTER — Encounter: Payer: Self-pay | Admitting: Internal Medicine

## 2021-08-12 LAB — CELIAC DISEASE PANEL
Endomysial IgA: NEGATIVE
IgA/Immunoglobulin A, Serum: 175 mg/dL (ref 90–386)
Transglutaminase IgA: <2 U/mL (ref 0–3)

## 2021-08-18 ENCOUNTER — Encounter: Payer: Self-pay | Admitting: Urology

## 2021-08-18 ENCOUNTER — Ambulatory Visit
Admission: RE | Admit: 2021-08-18 | Discharge: 2021-08-18 | Disposition: A | Discharge status: Home / Self Care | Payer: BC Managed Care – PPO | Source: Ambulatory Visit | Attending: Gastroenterology | Admitting: Gastroenterology

## 2021-08-18 ENCOUNTER — Other Ambulatory Visit: Payer: Self-pay

## 2021-08-18 ENCOUNTER — Ambulatory Visit (INDEPENDENT_AMBULATORY_CARE_PROVIDER_SITE_OTHER): Payer: BC Managed Care – PPO | Admitting: Urology

## 2021-08-18 ENCOUNTER — Encounter: Payer: Self-pay | Admitting: Internal Medicine

## 2021-08-18 VITALS — BP 114/72 | HR 66 | Temp 98.4°F | Ht 68.0 in | Wt 186.4 lb

## 2021-08-18 DIAGNOSIS — N400 Enlarged prostate without lower urinary tract symptoms: Secondary | ICD-10-CM

## 2021-08-18 DIAGNOSIS — R339 Retention of urine, unspecified: Secondary | ICD-10-CM

## 2021-08-18 DIAGNOSIS — R634 Abnormal weight loss: Secondary | ICD-10-CM | POA: Diagnosis present

## 2021-08-18 DIAGNOSIS — N5201 Erectile dysfunction due to arterial insufficiency: Secondary | ICD-10-CM | POA: Diagnosis not present

## 2021-08-18 DIAGNOSIS — K900 Celiac disease: Secondary | ICD-10-CM | POA: Insufficient documentation

## 2021-08-18 DIAGNOSIS — N35010 Post-traumatic urethral stricture, male, meatal: Secondary | ICD-10-CM

## 2021-08-18 LAB — MICROSCOPIC EXAMINATION
Bacteria, UA: NONE SEEN
Epithelial Cells (non renal): NONE SEEN /hpf (ref 0–10)
Renal Epithel, UA: NONE SEEN /hpf
WBC, UA: NONE SEEN /hpf (ref 0–5)

## 2021-08-18 LAB — URINALYSIS, ROUTINE W REFLEX MICROSCOPIC
Bilirubin, UA: NEGATIVE
Leukocytes,UA: NEGATIVE
Nitrite, UA: NEGATIVE
Specific Gravity, UA: >1.03 — ABNORMAL HIGH (ref 1.005–1.030)
Urobilinogen, Ur: 0.2 mg/dL (ref 0.2–1.0)
pH, UA: 5 (ref 5.0–7.5)

## 2021-08-18 NOTE — Progress Notes (Addendum)
History of Present Illness: Here for cysto.  This is for lower urinary tract symptomatology despite alpha-blocker management.  Past Medical History:  Diagnosis Date   Adult celiac disease    Anemia    BPH (benign prostatic hyperplasia)    DM (diabetes mellitus) (HCC)    Type II   GERD (gastroesophageal reflux disease)    HTN (hypertension)    Hyperlipidemia    Thoracic spondylosis without myelopathy 11/22/2016    Past Surgical History:  Procedure Laterality Date   COLONOSCOPY  2003   Dr. Laural Golden: two small polyps, small ext hemorrhoids.path:focal adenomatous changes   COLONOSCOPY N/A 05/03/2014   Procedure: COLONOSCOPY;  Surgeon: Danie Binder, MD;  [facility];  Service: Endoscopy;  Laterality: N/A;  12:00   ESOPHAGOGASTRODUODENOSCOPY N/A 05/03/2014   Procedure: ESOPHAGOGASTRODUODENOSCOPY (EGD);  Surgeon: Danie Binder, MD;  [facility];  Service: Endoscopy;  Laterality: N/A;   ESOPHAGOGASTRODUODENOSCOPY (EGD) WITH PROPOFOL N/A 06/03/2020   Procedure: ESOPHAGOGASTRODUODENOSCOPY (EGD) WITH PROPOFOL;  Surgeon: [name], MD;  Location: [facility];  Service: Endoscopy;  Laterality: N/A;   PARS PLANA VITRECTOMY Right 03/21/2021   Procedure: PARS PLANA VITRECTOMY 25 GAUGE WITH INJECTION OF ANTIBIOTICS FOR ENDOPHTHALMITIS;  Surgeon: Jalene Mullet, MD;  Location: [place];  Service: Ophthalmology;  Laterality: Right;    Home Medications:  Allergies as of 08/18/2021       Reactions   Duloxetine Other (See Comments)   "disoriented, confused, couldn't drive" Other reaction(s): Other (see comments) "disoriented, confused, couldn't drive" "disoriented, confused, couldn't drive" "disoriented, confused, couldn't drive"   Codeine Rash        Medication List        Accurate as of August 18, 2021  1:12 PM. If you have any questions, ask your nurse or doctor.          aspirin 81 MG EC tablet Take 81 mg by mouth daily.   atorvastatin 40 MG  tablet Commonly known as: LIPITOR Take 1 tablet (40 mg total) by mouth daily. What changed: when to take this   B COMPLEX PO Take 1 tablet by mouth daily.   gabapentin 300 MG capsule Commonly known as: NEURONTIN Take 3 capsules by mouth in the morning, at noon, and at bedtime.   glipiZIDE 5 MG tablet Commonly known as: GLUCOTROL Take 1 tablet (5 mg total) by mouth 2 (two) times daily before a meal. What changed: when to take this   hydrochlorothiazide 12.5 MG tablet Commonly known as: HYDRODIURIL Take 2 tablets by mouth daily.   Jardiance 25 MG Tabs tablet Generic drug: empagliflozin Take 25 mg by mouth every evening.   Linzess 145 MCG Caps capsule Generic drug: linaclotide Take 145 mcg by mouth daily as needed for constipation.   lisinopril 2.5 MG tablet Commonly known as: ZESTRIL Take 2.5 mg by mouth at bedtime.   ofloxacin 0.3 % ophthalmic solution Commonly known as: OCUFLOX Place 1 drop into the right eye 4 (four) times daily.   Ozempic (1 MG/DOSE) 4 MG/3ML Sopn Generic drug: Semaglutide (1 MG/DOSE) Inject 1 mg into the skin once a week.   pantoprazole 40 MG tablet Commonly known as: PROTONIX Take 40 mg by mouth every evening.   tamsulosin 0.4 MG Caps capsule Commonly known as: FLOMAX Take 1 capsule (0.4 mg total) by mouth 2 (two) times daily.   vitamin C 1000 MG tablet Take 1,000 mg by mouth daily.   VITAMIN D3 PO Take 1 capsule by mouth daily.  Allergies:  Allergies  Allergen Reactions   Duloxetine Other (See Comments)    "disoriented, confused, couldn't drive" Other reaction(s): Other (see comments) "disoriented, confused, couldn't drive" "disoriented, confused, couldn't drive" "disoriented, confused, couldn't drive"   Codeine Rash    Family History  Problem Relation Age of Onset   Aneurysm Mother        brain   Hypertension Mother    Cancer Father    Heart disease Father    Diabetes Father    Hypertension Father     Hyperlipidemia Father    Cancer Maternal Grandmother        melanoma   Heart disease Paternal Grandfather    Colon cancer Neg Hx     Social History:  reports that he has never smoked. His smokeless tobacco use includes snuff. He reports that he does not currently use alcohol. He reports that he does not use drugs.  ROS: A complete review of systems was performed.  All systems are negative except for pertinent findings as noted.  Physical Exam:  Vital signs in last 24 hours: BP 114/72   Pulse 66   Temp 98.4 F (36.9 C)   Ht 5' 8"  (1.727 m)   Wt 186 lb 6.4 oz (84.6 kg)   BMI 28.34 kg/m  Constitutional:  Alert and oriented, No acute distress Cardiovascular: Regular rate  Respiratory: Normal respiratory effort Lymphatic: No lymphadenopathy Neurologic: Grossly intact, no focal deficits Psychiatric: Normal mood and affect  I have reviewed prior pt notes  I have reviewed notes from referring/previous physicians  I have reviewed urinalysis results  I have independently reviewed prior imaging  I have reviewed prior PSA results  Cystoscopy Procedure Note:  Indication: Lower urinary tract symptoms  After informed consent and discussion of the procedure and its risks, Brian Nielsen was positioned and prepped in the standard fashion.  Cystoscopy was performed with a flexible cystoscope.   Findings: Urethra: He did have a distal urethral stricture.  Dilated to 20 [place] with Leander Rams sounds Prostate: Nonobstructive Bladder neck: No abnormalities Ureteral orifices: Normal in their location and configuration Bladder: No trabeculations, no urothelial abnormalities.  No foreign bodies.  The patient tolerated the procedure well.  His urethra was dilated to 20 [place] with [employer].  Complex uroflowmetry performed. Maximum/average flow--8/3 mL/s Voiding time--62 seconds Flow time--51 seconds Voided volume--194 mL Time to maximum flow--24  seconds  Impression/Assessment:  Lower urinary tract symptoms, no evidence of obstruction from prostate  Urethral stricture, mild, dilated  ED, unresponsive to PDE 5 inhibitors  Plan:  1.  I gave him a prescription for prostaglandin E1, 10 mcg, 5 separate 1 mL vials  2.  He will return next available to learn injection  3.  I let him know that I do not think I can make his urine come out any better as there is no evidence of obstruction.

## 2021-08-18 NOTE — Progress Notes (Signed)
Urological Symptom Review  Patient is experiencing the following symptoms: Get up at night to urinate Trouble starting stream Urinary tract infection Painful intercourse Weak stream Erection problems (male only) Penile pain (male only)    Review of Systems  Gastrointestinal (upper)  : Indigestion/heartburn  Gastrointestinal (lower) : Diarrhea Constipation  Constitutional : Weight loss  Skin: Negative for skin symptoms  Eyes: Negative for eye symptoms  Ear/Nose/Throat : Negative for Ear/Nose/Throat symptoms  Hematologic/Lymphatic: Easy bruising  Cardiovascular : Leg swelling  Respiratory : Negative for respiratory symptoms  Endocrine: Negative for endocrine symptoms  Musculoskeletal: Joint pain  Neurological: Dizziness  Psychologic: Negative for psychiatric symptoms

## 2021-08-19 ENCOUNTER — Encounter: Payer: Self-pay | Admitting: Gastroenterology

## 2021-08-24 ENCOUNTER — Telehealth: Payer: Self-pay

## 2021-08-24 NOTE — Telephone Encounter (Signed)
-----   Message from Lucilla Lame, MD sent at 08/24/2021  9:41 AM EDT ----- Please let the patient know that the test for a wheat allergy was negative.

## 2021-08-24 NOTE — Telephone Encounter (Signed)
Pt notified of lab results and small bowel follow through.

## 2021-08-24 NOTE — Telephone Encounter (Signed)
-----   Message from Lucilla Lame, MD sent at 08/24/2021  9:46 AM EDT ----- Please let the patient know that although his blood work did not show a wheat allergy the x-ray of his small bowel showed decreased folds in the small bowel consistent with possible celiac sprue and the next step would be biopsies of the small bowel with an upper endoscopy.

## 2021-08-31 ENCOUNTER — Encounter: Payer: Self-pay | Admitting: Internal Medicine

## 2021-08-31 ENCOUNTER — Telehealth: Payer: Self-pay | Admitting: Internal Medicine

## 2021-08-31 NOTE — Telephone Encounter (Signed)
Pt wife would like someone to call her about some labs he drawn at [facility]. She stated the provider felf Dr. B needed to review the lab work due to his iron levels. The wife does not want to wait until November for him to be seen.

## 2021-08-31 NOTE — Telephone Encounter (Signed)
Dr. B- pls advise.

## 2021-09-01 ENCOUNTER — Encounter: Payer: Self-pay | Admitting: Internal Medicine

## 2021-09-01 NOTE — Progress Notes (Signed)
On October 18 I spoke to patient's wife regarding the concerns for low on saturation at 18%.  Recommend IV on infusions.  Colette- Please schedule venifer weekly times three. Otherwise follow up as planned in November. Please call the wife to confirm this appointment.

## 2021-09-02 ENCOUNTER — Telehealth: Payer: Self-pay | Admitting: *Deleted

## 2021-09-02 NOTE — Telephone Encounter (Signed)
On October 18 I spoke to patient's wife regarding the concerns for low on saturation at 18%.   Recommend IV on infusions.   Colette- Please schedule venifer weekly times three. Otherwise follow up as planned in November. Please call the wife to confirm this appointment.

## 2021-09-03 ENCOUNTER — Other Ambulatory Visit: Payer: Self-pay

## 2021-09-04 ENCOUNTER — Encounter: Payer: Self-pay | Admitting: Internal Medicine

## 2021-09-07 ENCOUNTER — Ambulatory Visit: Payer: BC Managed Care – PPO | Admitting: Anesthesiology

## 2021-09-07 ENCOUNTER — Other Ambulatory Visit: Payer: Self-pay | Admitting: Urology

## 2021-09-07 ENCOUNTER — Other Ambulatory Visit: Payer: Self-pay

## 2021-09-07 ENCOUNTER — Encounter: Payer: Self-pay | Admitting: Gastroenterology

## 2021-09-07 ENCOUNTER — Ambulatory Visit
Admission: RE | Admit: 2021-09-07 | Discharge: 2021-09-07 | Disposition: A | Discharge status: Home / Self Care | Payer: BC Managed Care – PPO | Attending: Gastroenterology | Admitting: Gastroenterology

## 2021-09-07 ENCOUNTER — Encounter
Admission: RE | Disposition: A | Discharge status: Home / Self Care | Payer: Self-pay | Source: Home / Self Care | Attending: Gastroenterology

## 2021-09-07 DIAGNOSIS — Z7985 Long-term (current) use of injectable non-insulin antidiabetic drugs: Secondary | ICD-10-CM | POA: Diagnosis not present

## 2021-09-07 DIAGNOSIS — D122 Benign neoplasm of ascending colon: Secondary | ICD-10-CM | POA: Diagnosis not present

## 2021-09-07 DIAGNOSIS — K30 Functional dyspepsia: Secondary | ICD-10-CM | POA: Insufficient documentation

## 2021-09-07 DIAGNOSIS — K635 Polyp of colon: Secondary | ICD-10-CM

## 2021-09-07 DIAGNOSIS — E119 Type 2 diabetes mellitus without complications: Secondary | ICD-10-CM | POA: Insufficient documentation

## 2021-09-07 DIAGNOSIS — Z888 Allergy status to other drugs, medicaments and biological substances status: Secondary | ICD-10-CM | POA: Diagnosis not present

## 2021-09-07 DIAGNOSIS — F17290 Nicotine dependence, other tobacco product, uncomplicated: Secondary | ICD-10-CM | POA: Insufficient documentation

## 2021-09-07 DIAGNOSIS — K2970 Gastritis, unspecified, without bleeding: Secondary | ICD-10-CM

## 2021-09-07 DIAGNOSIS — K900 Celiac disease: Secondary | ICD-10-CM

## 2021-09-07 DIAGNOSIS — K2980 Duodenitis without bleeding: Secondary | ICD-10-CM | POA: Insufficient documentation

## 2021-09-07 DIAGNOSIS — Z7982 Long term (current) use of aspirin: Secondary | ICD-10-CM | POA: Diagnosis not present

## 2021-09-07 DIAGNOSIS — R634 Abnormal weight loss: Secondary | ICD-10-CM | POA: Diagnosis present

## 2021-09-07 DIAGNOSIS — Z7984 Long term (current) use of oral hypoglycemic drugs: Secondary | ICD-10-CM | POA: Insufficient documentation

## 2021-09-07 DIAGNOSIS — Z885 Allergy status to narcotic agent status: Secondary | ICD-10-CM | POA: Insufficient documentation

## 2021-09-07 DIAGNOSIS — K31A19 Gastric intestinal metaplasia without dysplasia, unspecified site: Secondary | ICD-10-CM | POA: Diagnosis not present

## 2021-09-07 HISTORY — PX: BIOPSY: SHX5522

## 2021-09-07 HISTORY — PX: POLYPECTOMY: SHX5525

## 2021-09-07 HISTORY — PX: COLONOSCOPY WITH PROPOFOL: SHX5780

## 2021-09-07 HISTORY — PX: ESOPHAGOGASTRODUODENOSCOPY (EGD) WITH PROPOFOL: SHX5813

## 2021-09-07 LAB — GLUCOSE, CAPILLARY
Glucose-Capillary: 227 mg/dL — ABNORMAL HIGH (ref 70–99)
Glucose-Capillary: 181 mg/dL — ABNORMAL HIGH (ref 70–99)

## 2021-09-07 SURGERY — COLONOSCOPY WITH PROPOFOL
Anesthesia: General | Site: Rectum

## 2021-09-07 MED ORDER — LIDOCAINE HCL (CARDIAC) PF 100 MG/5ML IV SOSY
PREFILLED_SYRINGE | INTRAVENOUS | Status: DC | PRN
  Administered 2021-09-07: 50 mg via INTRAVENOUS

## 2021-09-07 MED ORDER — SODIUM CHLORIDE 0.9 % IV SOLN: INTRAVENOUS | Status: DC

## 2021-09-07 MED ORDER — LACTATED RINGERS IV SOLN: INTRAVENOUS | Status: DC

## 2021-09-07 MED ORDER — STERILE WATER FOR IRRIGATION IR SOLN
Status: DC | PRN
  Administered 2021-09-07: 1

## 2021-09-07 MED ORDER — GLYCOPYRROLATE 0.2 MG/ML IJ SOLN
INTRAMUSCULAR | Status: DC | PRN
  Administered 2021-09-07: .1 mg via INTRAVENOUS

## 2021-09-07 MED ORDER — PROPOFOL 10 MG/ML IV BOLUS
INTRAVENOUS | Status: DC | PRN
  Administered 2021-09-07 (×2): 30 mg via INTRAVENOUS
  Administered 2021-09-07 (×5): 20 mg via INTRAVENOUS
  Administered 2021-09-07: 30 mg via INTRAVENOUS
  Administered 2021-09-07 (×3): 20 mg via INTRAVENOUS
  Administered 2021-09-07: 30 mg via INTRAVENOUS
  Administered 2021-09-07: 80 mg via INTRAVENOUS
  Administered 2021-09-07 (×2): 30 mg via INTRAVENOUS
  Administered 2021-09-07: 20 mg via INTRAVENOUS

## 2021-09-07 SURGICAL SUPPLY — 38 items
BALLN DILATOR 10-12 8 (BALLOONS)
BALLN DILATOR 12-15 8 (BALLOONS)
BALLN DILATOR 15-18 8 (BALLOONS)
BALLN DILATOR CRE 0-12 8 (BALLOONS)
BALLN DILATOR ESOPH 8 10 CRE (MISCELLANEOUS) IMPLANT
BALLOON DILATOR 12-15 8 (BALLOONS) IMPLANT
BALLOON DILATOR 15-18 8 (BALLOONS) IMPLANT
BALLOON DILATOR CRE 0-12 8 (BALLOONS) IMPLANT
BLOCK BITE 60FR ADLT L/F GRN (MISCELLANEOUS) ×3 IMPLANT
CLIP HMST 235XBRD CATH ROT (MISCELLANEOUS) IMPLANT
CLIP RESOLUTION 360 11X235 (MISCELLANEOUS)
ELECT REM PT RETURN 9FT ADLT (ELECTROSURGICAL)
ELECTRODE REM PT RTRN 9FT ADLT (ELECTROSURGICAL) IMPLANT
FCP ESCP3.2XJMB 240X2.8X (MISCELLANEOUS) ×2
FORCEPS BIOP RAD 4 LRG CAP 4 (CUTTING FORCEPS) IMPLANT
FORCEPS BIOP RJ4 240 W/NDL (MISCELLANEOUS) ×3
FORCEPS ESCP3.2XJMB 240X2.8X (MISCELLANEOUS) ×2 IMPLANT
GOWN CVR UNV OPN BCK APRN NK (MISCELLANEOUS) ×4 IMPLANT
GOWN ISOL THUMB LOOP REG UNIV (MISCELLANEOUS) ×6
INJECTOR VARIJECT VIN23 (MISCELLANEOUS) IMPLANT
KIT DEFENDO VALVE AND CONN (KITS) IMPLANT
KIT PRC NS LF DISP ENDO (KITS) ×2 IMPLANT
KIT PROCEDURE OLYMPUS (KITS) ×3
MANIFOLD NEPTUNE II (INSTRUMENTS) ×3 IMPLANT
MARKER SPOT ENDO TATTOO 5ML (MISCELLANEOUS) IMPLANT
PROBE APC STR FIRE (PROBE) IMPLANT
RETRIEVER NET PLAT FOOD (MISCELLANEOUS) IMPLANT
RETRIEVER NET ROTH 2.5X230 LF (MISCELLANEOUS) IMPLANT
SNARE COLD EXACTO (MISCELLANEOUS) ×3 IMPLANT
SNARE SHORT THROW 13M SML OVAL (MISCELLANEOUS) IMPLANT
SNARE SHORT THROW 30M LRG OVAL (MISCELLANEOUS) IMPLANT
SNARE SNG USE RND 15MM (INSTRUMENTS) IMPLANT
SPOT EX ENDOSCOPIC TATTOO (MISCELLANEOUS)
SYR INFLATION 60ML (SYRINGE) IMPLANT
TRAP ETRAP POLY (MISCELLANEOUS) ×3 IMPLANT
VARIJECT INJECTOR VIN23 (MISCELLANEOUS)
WATER STERILE IRR 250ML POUR (IV SOLUTION) ×3 IMPLANT
WIRE CRE 18-20MM 8CM F G (MISCELLANEOUS) IMPLANT

## 2021-09-07 NOTE — Op Note (Signed)
[facility] Patient Name: Brian Nielsen Procedure Date: 09/07/2021 7:25 AM MRN: [id number] Account #: [id number] Date of Birth: 10-16-1961 Admit Type: Outpatient Age: 60 Room: [facility] OR ROOM 01 Gender: Male Note Status: Finalized Instrument Name: [id number] Procedure:             Colonoscopy Indications:           Weight loss Providers:             Lucilla Lame MD, MD Referring MD:          Dion Body (Referring MD) Medicines:             Propofol per Anesthesia Complications:         No immediate complications. Procedure:             Pre-Anesthesia Assessment:                        - Prior to the procedure, a History and Physical was                         performed, and patient medications and allergies were                         reviewed. The patient's tolerance of previous                         anesthesia was also reviewed. The risks and benefits                         of the procedure and the sedation options and risks                         were discussed with the patient. All questions were                         answered, and informed consent was obtained. Prior                         Anticoagulants: The patient has taken no previous                         anticoagulant or antiplatelet agents. ASA Grade                         Assessment: II - A patient with mild systemic disease.                         After reviewing the risks and benefits, the patient                         was deemed in satisfactory condition to undergo the                         procedure.                        After obtaining informed consent, the colonoscope was  passed under direct vision. Throughout the procedure,                         the patient's blood pressure, pulse, and oxygen                         saturations were monitored continuously. The                         Colonoscope was introduced through the anus and                          advanced to the the terminal ileum. The colonoscopy                         was performed without difficulty. The patient                         tolerated the procedure well. The quality of the bowel                         preparation was adequate to identify polyps 6 mm and                         larger in size. Findings:      The perianal and digital rectal examinations were normal.      Two sessile polyps were found in the ascending colon. The polyps were 4       to 6 mm in size. These polyps were removed with a cold snare. Resection       and retrieval were complete.      A 3 mm polyp was found in the sigmoid colon. The polyp was sessile. The       polyp was removed with a cold snare. Resection and retrieval were       complete. Impression:            - Two 4 to 6 mm polyps in the ascending colon, removed                         with a cold snare. Resected and retrieved.                        - One 3 mm polyp in the sigmoid colon, removed with a                         cold snare. Resected and retrieved. Recommendation:        - Discharge patient to home.                        - Resume previous diet.                        - Continue present medications.                        - Await pathology results.                        - Repeat colonoscopy in  5 years for surveillance. Procedure Code(s):     --- Professional ---                        [phone], Colonoscopy, flexible; with removal of                         tumor(s), polyp(s), or other lesion(s) by snare                         technique Diagnosis Code(s):     --- Professional ---                        R63.4, Abnormal weight loss                        K63.5, Polyp of colon CPT copyright 2019 American Medical Association. All rights reserved. The codes documented in this report are preliminary and upon coder review may  be revised to meet current compliance requirements. Lucilla Lame MD, MD 09/07/2021  8:05:41 AM This report has been signed electronically. Number of Addenda: 0 Note Initiated On: 09/07/2021 7:25 AM Scope Withdrawal Time: 0 hours 6 minutes 23 seconds  Total Procedure Duration: 0 hours 13 minutes 57 seconds  Estimated Blood Loss:  Estimated blood loss: none.      [facility]

## 2021-09-07 NOTE — Anesthesia Preprocedure Evaluation (Signed)
Anesthesia Evaluation  Patient identified by MRN, date of birth, ID band Patient awake    History of Anesthesia Complications Negative for: history of anesthetic complications  Airway Mallampati: II  TM Distance: >3 FB Neck ROM: Full    Dental no notable dental hx.    Pulmonary neg pulmonary ROS,    Pulmonary exam normal        Cardiovascular Exercise Tolerance: Good hypertension, Normal cardiovascular exam     Neuro/Psych negative neurological ROS     GI/Hepatic GERD  Medicated and Controlled,Celiac disease   Endo/Other  diabetes, Poorly Controlled, Type 2  Renal/GU CRFRenal disease (stage 3b CKD)     Musculoskeletal   Abdominal   Peds  Hematology   Anesthesia Other Findings   Reproductive/Obstetrics                             Anesthesia Physical Anesthesia Plan  ASA: 3  Anesthesia Plan: General   Post-op Pain Management:    Induction: Intravenous  PONV Risk Score and Plan: 2 and Propofol infusion, TIVA and Treatment may vary due to age or medical condition  Airway Management Planned: Nasal Cannula and Natural Airway  Additional Equipment: None  Intra-op Plan:   Post-operative Plan:   Informed Consent: I have reviewed the patients History and Physical, chart, labs and discussed the procedure including the risks, benefits and alternatives for the proposed anesthesia with the patient or authorized representative who has indicated his/her understanding and acceptance.       Plan Discussed with: CRNA  Anesthesia Plan Comments:         Anesthesia Quick Evaluation

## 2021-09-07 NOTE — Anesthesia Postprocedure Evaluation (Signed)
Anesthesia Post Note  Patient: Brian Nielsen  Procedure(s) Performed: COLONOSCOPY WITH PROPOFOL (Rectum) ESOPHAGOGASTRODUODENOSCOPY (EGD) WITH PROPOFOL (Esophagus) BIOPSY (Esophagus) POLYPECTOMY (Rectum)     Patient location during evaluation: PACU Anesthesia Type: General Level of consciousness: awake and alert Pain management: pain level controlled Vital Signs Assessment: post-procedure vital signs reviewed and stable Respiratory status: spontaneous breathing, nonlabored ventilation, respiratory function stable and patient connected to nasal cannula oxygen Cardiovascular status: blood pressure returned to baseline and stable Postop Assessment: no apparent nausea or vomiting Anesthetic complications: no   No notable events documented.  Adele Barthel Zeth Buday

## 2021-09-07 NOTE — Anesthesia Procedure Notes (Signed)
Date/Time: 09/07/2021 7:39 AM Performed by: Mayme Genta, CRNA Pre-anesthesia Checklist: Patient identified, Emergency Drugs available, Suction available, Timeout performed and Patient being monitored Patient Re-evaluated:Patient Re-evaluated prior to induction Oxygen Delivery Method: Nasal cannula Placement Confirmation: positive ETCO2

## 2021-09-07 NOTE — Op Note (Signed)
[facility] Patient Name: Brian Nielsen Procedure Date: 09/07/2021 7:26 AM MRN: [id number] Account #: [id number] Date of Birth: 11-09-1961 Admit Type: Outpatient Age: 60 Room: [facility] OR ROOM 01 Gender: Male Note Status: Finalized Instrument Name: [id number] Procedure:             Upper GI endoscopy Indications:           Functional Dyspepsia, Weight loss Providers:             Lucilla Lame MD, MD Referring MD:          Dion Body (Referring MD) Medicines:             Propofol per Anesthesia Complications:         No immediate complications. Procedure:             Pre-Anesthesia Assessment:                        - Prior to the procedure, a History and Physical was                         performed, and patient medications and allergies were                         reviewed. The patient's tolerance of previous                         anesthesia was also reviewed. The risks and benefits                         of the procedure and the sedation options and risks                         were discussed with the patient. All questions were                         answered, and informed consent was obtained. Prior                         Anticoagulants: The patient has taken no previous                         anticoagulant or antiplatelet agents. ASA Grade                         Assessment: II - A patient with mild systemic disease.                         After reviewing the risks and benefits, the patient                         was deemed in satisfactory condition to undergo the                         procedure.                        After obtaining informed consent, the endoscope was  passed under direct vision. Throughout the procedure,                         the patient's blood pressure, pulse, and oxygen                         saturations were monitored continuously. The Endoscope                         was  introduced through the mouth, and advanced to the                         second part of duodenum. The upper GI endoscopy was                         accomplished without difficulty. The patient tolerated                         the procedure well. Findings:      The examined esophagus was normal.      Mildly erythematous mucosa without bleeding was found in the gastric       antrum. Biopsies were taken with a cold forceps for histology.      The examined duodenum was normal. Biopsies were taken with a cold       forceps for histology. Impression:            - Normal esophagus.                        - Erythematous mucosa in the antrum. Biopsied.                        - Normal examined duodenum. Biopsied. Recommendation:        - Discharge patient to home.                        - Resume previous diet.                        - Continue present medications.                        - Await pathology results.                        - Perform a colonoscopy. Procedure Code(s):     --- Professional ---                        [id number], Esophagogastroduodenoscopy, flexible,                         transoral; with biopsy, single or multiple Diagnosis Code(s):     --- Professional ---                        R63.4, Abnormal weight loss                        K30, Functional dyspepsia  K31.89, Other diseases of stomach and duodenum CPT copyright 2019 American [employer] Association. All rights reserved. The codes documented in this report are preliminary and upon coder review may  be revised to meet current compliance requirements. Lucilla Lame MD, MD 09/07/2021 7:47:43 AM This report has been signed electronically. Number of Addenda: 0 Note Initiated On: 09/07/2021 7:26 AM Total Procedure Duration: 0 hours 2 minutes 2 seconds  Estimated Blood Loss:  Estimated blood loss: none.      [facility]

## 2021-09-07 NOTE — Transfer of Care (Signed)
Immediate Anesthesia Transfer of Care Note  Patient: Brian Nielsen  Procedure(s) Performed: COLONOSCOPY WITH PROPOFOL (Rectum) ESOPHAGOGASTRODUODENOSCOPY (EGD) WITH PROPOFOL (Esophagus) BIOPSY (Esophagus) POLYPECTOMY (Rectum)  Patient Location: PACU  Anesthesia Type: General  Level of Consciousness: awake, alert  and patient cooperative  Airway and Oxygen Therapy: Patient Spontanous Breathing and Patient connected to supplemental oxygen  Post-op Assessment: Post-op Vital signs reviewed, Patient's Cardiovascular Status Stable, Respiratory Function Stable, Patent Airway and No signs of Nausea or vomiting  Post-op Vital Signs: Reviewed and stable  Complications: No notable events documented.

## 2021-09-07 NOTE — H&P (Signed)
Brian Lame, MD [facility] [address], [place], [place] [address] Phone:[phone] Fax : [phone]  Primary Care Physician:  Dion Body, MD Primary Gastroenterologist:  Dr. Allen Norris  Pre-Procedure History & Physical: HPI:  Brian Nielsen is a 60 y.o. male is here for an endoscopy and colonoscopy.   Past Medical History:  Diagnosis Date   Adult celiac disease    Anemia    BPH (benign prostatic hyperplasia)    DM (diabetes mellitus) (HCC)    Type II   GERD (gastroesophageal reflux disease)    HTN (hypertension)    Hyperlipidemia    Thoracic spondylosis without myelopathy 11/22/2016    Past Surgical History:  Procedure Laterality Date   COLONOSCOPY  2003   Dr. Laural Golden: two small polyps, small ext hemorrhoids.path:focal adenomatous changes   COLONOSCOPY N/A 05/03/2014   Procedure: COLONOSCOPY;  Surgeon: Danie Binder, MD;  [facility];  Service: Endoscopy;  Laterality: N/A;  12:00   ESOPHAGOGASTRODUODENOSCOPY N/A 05/03/2014   Procedure: ESOPHAGOGASTRODUODENOSCOPY (EGD);  Surgeon: Danie Binder, MD;  [facility];  Service: Endoscopy;  Laterality: N/A;   ESOPHAGOGASTRODUODENOSCOPY (EGD) WITH PROPOFOL N/A 06/03/2020   Procedure: ESOPHAGOGASTRODUODENOSCOPY (EGD) WITH PROPOFOL;  Surgeon: [name], MD;  Location: [facility];  Service: Endoscopy;  Laterality: N/A;   PARS PLANA VITRECTOMY Right 03/21/2021   Procedure: PARS PLANA VITRECTOMY 25 GAUGE WITH INJECTION OF ANTIBIOTICS FOR ENDOPHTHALMITIS;  Surgeon: Jalene Mullet, MD;  Location: [place];  Service: Ophthalmology;  Laterality: Right;    Prior to Admission medications   Medication Sig Start Date End Date Taking? Authorizing Provider  Ascorbic Acid (VITAMIN C) 1000 MG tablet Take 1,000 mg by mouth daily.   Yes [provider]  aspirin 81 MG EC tablet Take 81 mg by mouth daily.   Yes [provider]  atorvastatin (LIPITOR) 40 MG tablet Take 1 tablet (40 mg total) by  mouth daily. Patient taking differently: Take 40 mg by mouth every evening. 10/21/16  Yes Raylene Everts, MD  B Complex Vitamins (B COMPLEX PO) Take 1 tablet by mouth daily.   Yes [provider]  Cholecalciferol (VITAMIN D3 PO) Take 1 capsule by mouth daily.   Yes [provider]  gabapentin (NEURONTIN) 300 MG capsule Take 3 capsules by mouth in the morning, at noon, and at bedtime. 05/20/21  Yes [provider]  glipiZIDE (GLUCOTROL) 5 MG tablet Take 1 tablet (5 mg total) by mouth 2 (two) times daily before a meal. Patient taking differently: Take 5 mg by mouth daily before breakfast. 01/12/17  Yes Raylene Everts, MD  hydrochlorothiazide (HYDRODIURIL) 12.5 MG tablet Take 2 tablets by mouth daily. 05/19/21  Yes [provider]  JARDIANCE 25 MG TABS tablet Take 25 mg by mouth every evening. 09/08/19  Yes [provider]  ofloxacin (OCUFLOX) 0.3 % ophthalmic solution Place 1 drop into the right eye 4 (four) times daily. 05/22/21  Yes [provider]  OZEMPIC, 1 MG/DOSE, 4 MG/3ML SOPN Inject 1 mg into the skin once a week. 11/03/20  Yes [provider]  pantoprazole (PROTONIX) 40 MG tablet Take 40 mg by mouth every evening. 09/08/19  Yes [provider]  LINZESS 145 MCG CAPS capsule Take 145 mcg by mouth daily as needed for constipation. Patient not taking: Reported on 08/19/2021 11/12/20   [provider]  lisinopril (ZESTRIL) 2.5 MG tablet Take 2.5 mg by mouth at bedtime. Patient not taking: Reported on 08/19/2021 11/10/20  [provider]  tamsulosin (FLOMAX) 0.4 MG CAPS capsule Take 1 capsule (0.4 mg total) by mouth 2 (two) times daily. Patient not taking: Reported on 08/19/2021 07/14/21   Franchot Gallo, MD    Allergies as of 08/06/2021 - Review Complete 08/03/2021  Allergen Reaction Noted   Duloxetine Other (See Comments) 12/19/2019   Codeine Rash 03/21/2021    Family History  Problem Relation  Age of Onset   Aneurysm Mother        brain   Hypertension Mother    Cancer Father    Heart disease Father    Diabetes Father    Hypertension Father    Hyperlipidemia Father    Cancer Maternal Grandmother        melanoma   Heart disease Paternal Grandfather    Colon cancer Neg Hx     Social History   Socioeconomic History   Marital status: Married    Spouse name: Dentist   Number of children: 1   Years of education: 12   Highest education level: Not on file  Occupational History   Occupation: Programmer, systems: [employer]    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  Tobacco Use   Smoking status: Never   Smokeless tobacco: Current    Types: Snuff   Tobacco comments:    Quit x 30 years  Vaping Use   Vaping Use: Never used  Substance and Sexual Activity   Alcohol use: Not Currently    Comment: occasionally on weekends   Drug use: No   Sexual activity: Yes    Birth control/protection: Surgical  Other Topics Concern   Not on file  Social History Narrative   Lives with wife   [place]   Caffeine- diet Mtn Dew, 2 glasses   Social Determinants of Health   Financial Resource Strain: Not on file  Food Insecurity: Not on file  Transportation Needs: Not on file  Physical Activity: Not on file  Stress: Not on file  Social Connections: Not on file  Intimate Partner Violence: Not on file    Review of Systems: See HPI, otherwise negative ROS  Physical Exam: BP 138/75   Pulse 62   Temp 98 F (36.7 C) (Temporal)   Resp 15   Ht 5' 8"  (1.727 m)   Wt 82.6 kg   SpO2 98%   BMI 27.67 kg/m  General:   Alert,  pleasant and cooperative in NAD Head:  Normocephalic and atraumatic. Neck:  Supple; no masses or thyromegaly. Lungs:  Clear throughout to auscultation.    Heart:  Regular rate and rhythm. Abdomen:  Soft, nontender and nondistended. Normal bowel sounds, without guarding, and without rebound.   Neurologic:  Alert and  oriented x4;   grossly normal neurologically.  Impression/Plan: Brian Nielsen is here for an endoscopy and colonoscopy to be performed for weight loss and dyspepsia  Risks, benefits, limitations, and alternatives regarding  endoscopy and colonoscopy have been reviewed with the patient.  Questions have been answered.  All parties agreeable.   Brian Lame, MD  09/07/2021, 7:32 AM

## 2021-09-09 ENCOUNTER — Other Ambulatory Visit: Payer: Self-pay | Admitting: Pathology

## 2021-09-09 ENCOUNTER — Encounter: Payer: Self-pay | Admitting: Gastroenterology

## 2021-09-10 ENCOUNTER — Other Ambulatory Visit: Payer: Self-pay

## 2021-09-10 ENCOUNTER — Telehealth: Payer: Self-pay

## 2021-09-10 DIAGNOSIS — R933 Abnormal findings on diagnostic imaging of other parts of digestive tract: Secondary | ICD-10-CM

## 2021-09-10 DIAGNOSIS — R634 Abnormal weight loss: Secondary | ICD-10-CM

## 2021-09-10 LAB — SURGICAL PATHOLOGY

## 2021-09-10 NOTE — Telephone Encounter (Signed)
Pt returned my call and labs were discussed with this patient. Pt will come today to have additional labs done per Dr. Allen Norris.

## 2021-09-10 NOTE — Telephone Encounter (Signed)
LVM for pt to return my call.

## 2021-09-11 ENCOUNTER — Encounter: Payer: Self-pay | Admitting: Internal Medicine

## 2021-09-11 ENCOUNTER — Inpatient Hospital Stay: Payer: BC Managed Care – PPO | Attending: Internal Medicine

## 2021-09-11 ENCOUNTER — Other Ambulatory Visit: Payer: Self-pay

## 2021-09-11 VITALS — BP 143/82 | HR 67 | Temp 97.0°F | Resp 19

## 2021-09-11 DIAGNOSIS — D649 Anemia, unspecified: Secondary | ICD-10-CM | POA: Insufficient documentation

## 2021-09-11 LAB — HIV ANTIBODY (ROUTINE TESTING W REFLEX): HIV Screen 4th Generation wRfx: NONREACTIVE

## 2021-09-11 MED ORDER — SODIUM CHLORIDE 0.9 % IV SOLN: 200.0000 mg | Freq: Once | INTRAVENOUS | Status: DC

## 2021-09-11 MED ORDER — SODIUM CHLORIDE 0.9 % IV SOLN
Freq: Once | INTRAVENOUS | Status: AC
  Filled 2021-09-11: qty 250

## 2021-09-11 MED ORDER — IRON SUCROSE 20 MG/ML IV SOLN
200.0000 mg | Freq: Once | INTRAVENOUS | Status: AC
  Administered 2021-09-11: 200 mg via INTRAVENOUS
  Filled 2021-09-11: qty 10

## 2021-09-11 NOTE — Patient Instructions (Signed)

## 2021-09-14 ENCOUNTER — Other Ambulatory Visit: Payer: Self-pay

## 2021-09-14 ENCOUNTER — Ambulatory Visit
Admission: EM | Admit: 2021-09-14 | Discharge: 2021-09-14 | Disposition: A | Discharge status: Home / Self Care | Payer: BC Managed Care – PPO | Attending: Family Medicine | Admitting: Family Medicine

## 2021-09-14 ENCOUNTER — Encounter: Payer: Self-pay | Admitting: Internal Medicine

## 2021-09-14 DIAGNOSIS — M25521 Pain in right elbow: Secondary | ICD-10-CM | POA: Diagnosis not present

## 2021-09-14 DIAGNOSIS — M19021 Primary osteoarthritis, right elbow: Secondary | ICD-10-CM | POA: Diagnosis not present

## 2021-09-14 MED ORDER — DEXAMETHASONE SODIUM PHOSPHATE 10 MG/ML IJ SOLN
10.0000 mg | Freq: Once | INTRAMUSCULAR | Status: AC
  Administered 2021-09-14: 10 mg via INTRAMUSCULAR

## 2021-09-14 NOTE — ED Provider Notes (Signed)
[facility]    CSN: [id number] Arrival date & time: 09/14/21  1353      History   Chief Complaint Chief Complaint  Patient presents with   Elbow Pain    HPI Brian Nielsen is a 60 y.o. male.   Patient presenting today with 1 week history of right elbow pain, stiffness, swelling, crepitus.  States he was lifting some heavy car batteries and thinks this flared up his chronic elbow arthritis.  States this happens frequently to him and he needs a steroid shot to help reduce the symptoms.  Has tried topical treatments, rest, ice with minimal relief.  Denies weakness, numbness, tingling, discoloration, swelling.   Past Medical History:  Diagnosis Date   Adult celiac disease    Anemia    BPH (benign prostatic hyperplasia)    DM (diabetes mellitus) (HCC)    Type II   GERD (gastroesophageal reflux disease)    HTN (hypertension)    Hyperlipidemia    Thoracic spondylosis without myelopathy 11/22/2016    Patient Active Problem List   Diagnosis Date Noted   Loss of weight    Polyp of ascending colon    Gastritis without bleeding    Symptomatic anemia 05/28/2021   Thoracic spondylosis without myelopathy 11/22/2016   Vitamin D deficiency 11/22/2016   HLD (hyperlipidemia) 10/19/2016   BPH (benign prostatic hyperplasia) 10/19/2016   Essential hypertension 10/19/2016   Diabetes mellitus, insulin dependent (IDDM), controlled 10/19/2016   Celiac sprue 05/23/2014   Anemia, iron deficiency 04/19/2014   GERD (gastroesophageal reflux disease) 04/19/2014    Past Surgical History:  Procedure Laterality Date   BIOPSY N/A 09/07/2021   Procedure: BIOPSY;  Surgeon: Lucilla Lame, MD;  Location: [place];  Service: Endoscopy;  Laterality: N/A;   COLONOSCOPY  2003   Dr. Laural Golden: two small polyps, small ext hemorrhoids.path:focal adenomatous changes   COLONOSCOPY N/A 05/03/2014   Procedure: COLONOSCOPY;  Surgeon: Danie Binder, MD;  [facility];  Service:  Endoscopy;  Laterality: N/A;  12:00   COLONOSCOPY WITH PROPOFOL N/A 09/07/2021   Procedure: COLONOSCOPY WITH PROPOFOL;  Surgeon: Lucilla Lame, MD;  Location: [place];  Service: Endoscopy;  Laterality: N/A;   ESOPHAGOGASTRODUODENOSCOPY N/A 05/03/2014   Procedure: ESOPHAGOGASTRODUODENOSCOPY (EGD);  Surgeon: Danie Binder, MD;  [facility];  Service: Endoscopy;  Laterality: N/A;   ESOPHAGOGASTRODUODENOSCOPY (EGD) WITH PROPOFOL N/A 06/03/2020   Procedure: ESOPHAGOGASTRODUODENOSCOPY (EGD) WITH PROPOFOL;  Surgeon: [name], MD;  Location: [facility];  Service: Endoscopy;  Laterality: N/A;   ESOPHAGOGASTRODUODENOSCOPY (EGD) WITH PROPOFOL N/A 09/07/2021   Procedure: ESOPHAGOGASTRODUODENOSCOPY (EGD) WITH PROPOFOL;  Surgeon: Lucilla Lame, MD;  Location: [place];  Service: Endoscopy;  Laterality: N/A;  Diabetic   PARS PLANA VITRECTOMY Right 03/21/2021   Procedure: PARS PLANA VITRECTOMY 25 GAUGE WITH INJECTION OF ANTIBIOTICS FOR ENDOPHTHALMITIS;  Surgeon: Jalene Mullet, MD;  Location: [place];  Service: Ophthalmology;  Laterality: Right;   POLYPECTOMY N/A 09/07/2021   Procedure: POLYPECTOMY;  Surgeon: Lucilla Lame, MD;  Location: [place];  Service: Endoscopy;  Laterality: N/A;       Home Medications    Prior to Admission medications   Medication Sig Start Date End Date Taking? Authorizing Provider  Ascorbic Acid (VITAMIN C) 1000 MG tablet Take 1,000 mg by mouth daily.    [provider]  aspirin 81 MG EC tablet Take 81 mg by mouth daily.    [provider]  atorvastatin (LIPITOR) 40 MG tablet  Take 1 tablet (40 mg total) by mouth daily. Patient taking differently: Take 40 mg by mouth every evening. 10/21/16   Raylene Everts, MD  B Complex Vitamins (B COMPLEX PO) Take 1 tablet by mouth daily.    [provider]  Cholecalciferol (VITAMIN D3 PO) Take 1 capsule by mouth daily.    [provider]  gabapentin  (NEURONTIN) 300 MG capsule Take 3 capsules by mouth in the morning, at noon, and at bedtime. 05/20/21   [provider]  glipiZIDE (GLUCOTROL) 5 MG tablet Take 1 tablet (5 mg total) by mouth 2 (two) times daily before a meal. Patient taking differently: Take 5 mg by mouth daily before breakfast. 01/12/17   Raylene Everts, MD  hydrochlorothiazide (HYDRODIURIL) 12.5 MG tablet Take 2 tablets by mouth daily. 05/19/21   [provider]  JARDIANCE 25 MG TABS tablet Take 25 mg by mouth every evening. 09/08/19   [provider]  LINZESS 145 MCG CAPS capsule Take 145 mcg by mouth daily as needed for constipation. Patient not taking: No sig reported 11/12/20   [provider]  lisinopril (ZESTRIL) 2.5 MG tablet Take 2.5 mg by mouth at bedtime. Patient not taking: No sig reported 11/10/20   [provider]  ofloxacin (OCUFLOX) 0.3 % ophthalmic solution Place 1 drop into the right eye 4 (four) times daily. 05/22/21   [provider]  OZEMPIC, 1 MG/DOSE, 4 MG/3ML SOPN Inject 1 mg into the skin once a week. 11/03/20   [provider]  pantoprazole (PROTONIX) 40 MG tablet Take 40 mg by mouth every evening. 09/08/19   [provider]  tamsulosin (FLOMAX) 0.4 MG CAPS capsule TAKE 1 CAPSULE BY MOUTH 2 TIMES DAILY. 09/07/21   Franchot Gallo, MD    Family History Family History  Problem Relation Age of Onset   Aneurysm Mother        brain   Hypertension Mother    Cancer Father    Heart disease Father    Diabetes Father    Hypertension Father    Hyperlipidemia Father    Cancer Maternal Grandmother        melanoma   Heart disease Paternal Grandfather    Colon cancer Neg Hx     Social History Social History   Tobacco Use   Smoking status: Never   Smokeless tobacco: Current    Types: Snuff   Tobacco comments:    Quit x 30 years  Vaping Use   Vaping Use: Never used  Substance Use Topics   Alcohol use: Not Currently     Comment: occasionally on weekends   Drug use: No     Allergies   Duloxetine, Gluten meal, and Codeine   Review of Systems Review of Systems Per HPI  Physical Exam Triage Vital Signs ED Triage Vitals  Enc Vitals Group     BP 09/14/21 1455 114/70     Pulse Rate 09/14/21 1455 75     Resp 09/14/21 1455 17     Temp 09/14/21 1455 98.7 F (37.1 C)     Temp Source 09/14/21 1455 Oral     SpO2 09/14/21 1455 96 %     Weight --      Height --      Head Circumference --      Peak Flow --      Pain Score 09/14/21 1453 8     Pain Loc --      Pain Edu? --  Excl. in GC? --    No data found.  Updated Vital Signs BP 114/70 (BP Location: Right Arm)   Pulse 75   Temp 98.7 F (37.1 C) (Oral)   Resp 17   SpO2 96%   Visual Acuity Right Eye Distance:   Left Eye Distance:   Bilateral Distance:    Right Eye Near:   Left Eye Near:    Bilateral Near:     Physical Exam Vitals and nursing note reviewed.  Constitutional:      Appearance: Normal appearance.  HENT:     Head: Atraumatic.  Eyes:     Extraocular Movements: Extraocular movements intact.     Conjunctiva/sclera: Conjunctivae normal.  Cardiovascular:     Rate and Rhythm: Normal rate and regular rhythm.  Pulmonary:     Effort: Pulmonary effort is normal.     Breath sounds: Normal breath sounds.  Musculoskeletal:        General: Swelling and tenderness present. No deformity. Normal range of motion.     Cervical back: Normal range of motion and neck supple.     Comments: Diffuse right elbow edema, tenderness to palpation.  Crepitus with passive range of motion  Skin:    General: Skin is warm and dry.     Findings: No bruising or erythema.  Neurological:     General: No focal deficit present.     Mental Status: He is oriented to person, place, and time.     Comments: Right upper extremity neurovascular intact  Psychiatric:        Mood and Affect: Mood normal.        Thought Content: Thought content normal.         Judgment: Judgment normal.   UC Treatments / Results  Labs (all labs ordered are listed, but only abnormal results are displayed) Labs Reviewed - No data to display  EKG   Radiology No results found.  Procedures Procedures (including critical care time)  Medications Ordered in UC Medications  dexamethasone (DECADRON) injection 10 mg (has no administration in time range)    Initial Impression / Assessment and Plan / UC Course  I have reviewed the triage vital signs and the nursing notes.  Pertinent labs & imaging results that were available during my care of the patient were reviewed by me and considered in my medical decision making (see chart for details).     Vital signs and exam overall reassuring, consistent with arthritis flare.  We will treat with IM Decadron, topical Voltaren and RICE protocol.  Follow-up with orthopedics for recheck.   Final Clinical Impressions(s) / UC Diagnoses   Final diagnoses:  Right elbow pain  Primary osteoarthritis of right elbow   Discharge Instructions   None    ED Prescriptions   None    PDMP not reviewed this encounter.   Volney American, [place] 09/14/21 [phone]

## 2021-09-14 NOTE — ED Triage Notes (Addendum)
Pt is here with right elbow pain that started a week ago, pt has not taken any meds to relieve discomfort.

## 2021-09-15 LAB — CRYPTOSPORIDIUM EIA: Cryptosporidium EIA: NEGATIVE

## 2021-09-17 ENCOUNTER — Telehealth: Payer: Self-pay

## 2021-09-17 NOTE — Telephone Encounter (Signed)
-----   Message from Lucilla Lame, MD sent at 09/15/2021  5:18 PM EDT ----- That the patient know that his Cryptosporidium was also negative which is a good thing but there were other findings in his stomach that should be discussed.  These have him come in to the office so we can discuss this.

## 2021-09-17 NOTE — Telephone Encounter (Signed)
Pt's wife notified of lab results. Scheduled follow up to discuss additional results.

## 2021-09-17 NOTE — Telephone Encounter (Signed)
-----   Message from Lucilla Lame, MD sent at 09/11/2021  8:47 AM EDT ----- Let the patient know that the HIV was negative.

## 2021-09-18 ENCOUNTER — Inpatient Hospital Stay: Payer: BC Managed Care – PPO | Attending: Internal Medicine

## 2021-09-18 ENCOUNTER — Other Ambulatory Visit: Payer: Self-pay

## 2021-09-18 ENCOUNTER — Encounter: Payer: Self-pay | Admitting: Internal Medicine

## 2021-09-18 VITALS — BP 158/80 | HR 63 | Temp 96.1°F | Resp 16

## 2021-09-18 DIAGNOSIS — D649 Anemia, unspecified: Secondary | ICD-10-CM | POA: Diagnosis not present

## 2021-09-18 MED ORDER — SODIUM CHLORIDE 0.9 % IV SOLN
Freq: Once | INTRAVENOUS | Status: AC
  Filled 2021-09-18: qty 250

## 2021-09-18 MED ORDER — IRON SUCROSE 20 MG/ML IV SOLN
200.0000 mg | Freq: Once | INTRAVENOUS | Status: AC
  Administered 2021-09-18: 200 mg via INTRAVENOUS
  Filled 2021-09-18: qty 10

## 2021-09-18 MED ORDER — SODIUM CHLORIDE 0.9 % IV SOLN: 200.0000 mg | Freq: Once | INTRAVENOUS | Status: DC

## 2021-09-18 NOTE — Progress Notes (Signed)
Pt tolerated venofer infusion well with no problems or complaints noted.  Pt left infusion suite stable and ambulatory.

## 2021-09-18 NOTE — Patient Instructions (Signed)

## 2021-09-22 ENCOUNTER — Ambulatory Visit (INDEPENDENT_AMBULATORY_CARE_PROVIDER_SITE_OTHER): Payer: BC Managed Care – PPO | Admitting: Gastroenterology

## 2021-09-22 ENCOUNTER — Encounter: Payer: Self-pay | Admitting: Gastroenterology

## 2021-09-22 VITALS — BP 128/67 | HR 66 | Ht 68.0 in | Wt 189.0 lb

## 2021-09-22 DIAGNOSIS — R634 Abnormal weight loss: Secondary | ICD-10-CM | POA: Diagnosis not present

## 2021-09-22 NOTE — Progress Notes (Signed)
Primary Care Physician: Dion Body, MD  Primary Gastroenterologist:  Dr. Lucilla Lame  No chief complaint on file.   HPI: Brian Nielsen is a 60 y.o. male here for follow-up after his EGD and colonoscopy.  The patient had polyps removed then was recommended to have a repeat colonoscopy in 5 years.  The patient also had biopsies of the colon that showed findings worrisome for Cryptosporidium.  The patient had a test for Cryptosporidium was negative.  I discussed with the pathologist and they recommended a second term for rheumatoid test. The patient also was found to have gastric intestinal metaplasia.  His small bowel biopsies showed preserved villi.  He has a history of celiac sprue.  The patient also has a history of diabetes and has end organ damage with peripheral neuropathy, retinopathy and likely gastroparesis.  Past Medical History:  Diagnosis Date   Adult celiac disease    Anemia    BPH (benign prostatic hyperplasia)    DM (diabetes mellitus) (HCC)    Type II   GERD (gastroesophageal reflux disease)    HTN (hypertension)    Hyperlipidemia    Thoracic spondylosis without myelopathy 11/22/2016    Current Outpatient Medications  Medication Sig Dispense Refill   Ascorbic Acid (VITAMIN C) 1000 MG tablet Take 1,000 mg by mouth daily.     aspirin 81 MG EC tablet Take 81 mg by mouth daily.     atorvastatin (LIPITOR) 40 MG tablet Take 1 tablet (40 mg total) by mouth daily. (Patient taking differently: Take 40 mg by mouth every evening.) 90 tablet 3   B Complex Vitamins (B COMPLEX PO) Take 1 tablet by mouth daily.     Cholecalciferol (VITAMIN D3 PO) Take 1 capsule by mouth daily.     gabapentin (NEURONTIN) 300 MG capsule Take 3 capsules by mouth in the morning, at noon, and at bedtime.     glipiZIDE (GLUCOTROL) 5 MG tablet Take 1 tablet (5 mg total) by mouth 2 (two) times daily before a meal. (Patient taking differently: Take 5 mg by mouth daily before breakfast.) 60 tablet 0    hydrochlorothiazide (HYDRODIURIL) 12.5 MG tablet Take 2 tablets by mouth daily.     JARDIANCE 25 MG TABS tablet Take 25 mg by mouth every evening.     LINZESS 145 MCG CAPS capsule Take 145 mcg by mouth daily as needed for constipation. (Patient not taking: No sig reported)     lisinopril (ZESTRIL) 2.5 MG tablet Take 2.5 mg by mouth at bedtime. (Patient not taking: No sig reported)     ofloxacin (OCUFLOX) 0.3 % ophthalmic solution Place 1 drop into the right eye 4 (four) times daily.     OZEMPIC, 1 MG/DOSE, 4 MG/3ML SOPN Inject 1 mg into the skin once a week.     pantoprazole (PROTONIX) 40 MG tablet Take 40 mg by mouth every evening.     tamsulosin (FLOMAX) 0.4 MG CAPS capsule TAKE 1 CAPSULE BY MOUTH 2 TIMES DAILY. 60 capsule 1   No current facility-administered medications for this visit.    Allergies as of 09/22/2021 - Review Complete 09/14/2021  Allergen Reaction Noted   Duloxetine Other (See Comments) 12/19/2019   Gluten meal  08/19/2021   Codeine Rash 03/21/2021    ROS:  General: Negative for anorexia, weight loss, fever, chills, fatigue, weakness. ENT: Negative for hoarseness, difficulty swallowing , nasal congestion. CV: Negative for chest pain, angina, palpitations, dyspnea on exertion, peripheral edema.  Respiratory: Negative for dyspnea at  rest, dyspnea on exertion, cough, sputum, wheezing.  GI: See history of present illness. GU:  Negative for dysuria, hematuria, urinary incontinence, urinary frequency, nocturnal urination.  Endo: Negative for unusual weight change.    Physical Examination:   There were no vitals taken for this visit.  General: Well-nourished, well-developed in no acute distress.  Eyes: No icterus. Conjunctivae pink. Neuro: Alert and oriented x 3.  Grossly intact. Skin: Warm and dry, no jaundice.   Psych: Alert and cooperative, normal mood and affect.  Labs:    Imaging Studies: No results found.  Assessment and Plan:   Brian Nielsen is a 60  y.o. y/o male who has multiple GI issues one of which has been diarrhea that he states has improved. The patient is concerned about his well water and will have his stools checked again as per recommendations by pathology for possible cryptosporidia.  The patient will also have his stool sent for ova and parasites of the GI panel.  The patient has been told that he needs a repeat EGD in 2 years and a colonoscopy in 5 years.  His weight has been stable and his symptoms are likely attributed to his diabetes since he has multiple sequela of the diabetes including peripheral neuropathy, retinopathy and nephropathy and likely gastroparesis.  The patient has been explained the plan and agrees with it.     Lucilla Lame, MD. Marval Regal    Note: This dictation was prepared with Dragon dictation along with smaller phrase technology. Any transcriptional errors that result from this process are unintentional.

## 2021-09-25 ENCOUNTER — Encounter: Payer: Self-pay | Admitting: Internal Medicine

## 2021-09-25 ENCOUNTER — Inpatient Hospital Stay: Payer: BC Managed Care – PPO

## 2021-09-25 ENCOUNTER — Other Ambulatory Visit: Payer: Self-pay

## 2021-09-25 VITALS — BP 128/73 | HR 63 | Temp 98.3°F | Resp 16

## 2021-09-25 DIAGNOSIS — D649 Anemia, unspecified: Secondary | ICD-10-CM

## 2021-09-25 MED ORDER — IRON SUCROSE 20 MG/ML IV SOLN
200.0000 mg | Freq: Once | INTRAVENOUS | Status: AC
  Administered 2021-09-25: 200 mg via INTRAVENOUS
  Filled 2021-09-25: qty 10

## 2021-09-25 MED ORDER — SODIUM CHLORIDE 0.9 % IV SOLN: 200.0000 mg | Freq: Once | INTRAVENOUS | Status: DC

## 2021-09-25 MED ORDER — SODIUM CHLORIDE 0.9 % IV SOLN
Freq: Once | INTRAVENOUS | Status: AC
Start: 2021-09-25 — End: 2021-09-25
  Filled 2021-09-25: qty 250

## 2021-09-25 NOTE — Patient Instructions (Signed)

## 2021-09-29 ENCOUNTER — Ambulatory Visit (INDEPENDENT_AMBULATORY_CARE_PROVIDER_SITE_OTHER): Payer: BC Managed Care – PPO | Admitting: Urology

## 2021-09-29 ENCOUNTER — Other Ambulatory Visit: Payer: Self-pay

## 2021-09-29 ENCOUNTER — Encounter: Payer: Self-pay | Admitting: Urology

## 2021-09-29 VITALS — BP 146/81 | HR 68 | Temp 98.5°F

## 2021-09-29 DIAGNOSIS — N5201 Erectile dysfunction due to arterial insufficiency: Secondary | ICD-10-CM | POA: Diagnosis not present

## 2021-09-29 DIAGNOSIS — N35010 Post-traumatic urethral stricture, male, meatal: Secondary | ICD-10-CM

## 2021-09-29 DIAGNOSIS — N400 Enlarged prostate without lower urinary tract symptoms: Secondary | ICD-10-CM

## 2021-09-29 DIAGNOSIS — N486 Induration penis plastica: Secondary | ICD-10-CM | POA: Diagnosis not present

## 2021-09-29 NOTE — Progress Notes (Signed)
H&P    History of Present Illness: Patient with the ED.  He comes in today to begin prostaglandin injections/instruction.  He brings in 10 mcg quantities.  He also has Peyronie's with leftward curvature.  At his last visit he had cystoscopy and dilation of a distal urethral stricture.  He still has spraying with urination.  Past Medical History:  Diagnosis Date   Adult celiac disease    Anemia    BPH (benign prostatic hyperplasia)    DM (diabetes mellitus) (HCC)    Type II   GERD (gastroesophageal reflux disease)    HTN (hypertension)    Hyperlipidemia    Thoracic spondylosis without myelopathy 11/22/2016    Past Surgical History:  Procedure Laterality Date   BIOPSY N/A 09/07/2021   Procedure: BIOPSY;  Surgeon: Lucilla Lame, MD;  Location: [place];  Service: Endoscopy;  Laterality: N/A;   COLONOSCOPY  2003   Dr. Laural Golden: two small polyps, small ext hemorrhoids.path:focal adenomatous changes   COLONOSCOPY N/A 05/03/2014   Procedure: COLONOSCOPY;  Surgeon: Danie Binder, MD;  [facility];  Service: Endoscopy;  Laterality: N/A;  12:00   COLONOSCOPY WITH PROPOFOL N/A 09/07/2021   Procedure: COLONOSCOPY WITH PROPOFOL;  Surgeon: Lucilla Lame, MD;  Location: [place];  Service: Endoscopy;  Laterality: N/A;   ESOPHAGOGASTRODUODENOSCOPY N/A 05/03/2014   Procedure: ESOPHAGOGASTRODUODENOSCOPY (EGD);  Surgeon: Danie Binder, MD;  [facility];  Service: Endoscopy;  Laterality: N/A;   ESOPHAGOGASTRODUODENOSCOPY (EGD) WITH PROPOFOL N/A 06/03/2020   Procedure: ESOPHAGOGASTRODUODENOSCOPY (EGD) WITH PROPOFOL;  Surgeon: [name], MD;  Location: [facility];  Service: Endoscopy;  Laterality: N/A;   ESOPHAGOGASTRODUODENOSCOPY (EGD) WITH PROPOFOL N/A 09/07/2021   Procedure: ESOPHAGOGASTRODUODENOSCOPY (EGD) WITH PROPOFOL;  Surgeon: Lucilla Lame, MD;  Location: [place];  Service: Endoscopy;  Laterality: N/A;  Diabetic   PARS PLANA  VITRECTOMY Right 03/21/2021   Procedure: PARS PLANA VITRECTOMY 25 GAUGE WITH INJECTION OF ANTIBIOTICS FOR ENDOPHTHALMITIS;  Surgeon: Jalene Mullet, MD;  Location: [place];  Service: Ophthalmology;  Laterality: Right;   POLYPECTOMY N/A 09/07/2021   Procedure: POLYPECTOMY;  Surgeon: Lucilla Lame, MD;  Location: [place];  Service: Endoscopy;  Laterality: N/A;    Home Medications:  Allergies as of 09/29/2021       Reactions   Duloxetine Other (See Comments)   "disoriented, confused, couldn't drive"   Gluten Meal    Celiac Disease   Codeine Rash        Medication List        Accurate as of September 29, 2021  3:04 PM. If you have any questions, ask your nurse or doctor.          aspirin 81 MG EC tablet Take 81 mg by mouth daily.   atorvastatin 40 MG tablet Commonly known as: LIPITOR Take 1 tablet (40 mg total) by mouth daily. What changed: when to take this   B COMPLEX PO Take 1 tablet by mouth daily.   gabapentin 300 MG capsule Commonly known as: NEURONTIN Take 3 capsules by mouth in the morning, at noon, and at bedtime.   glipiZIDE 5 MG tablet Commonly known as: GLUCOTROL Take 1 tablet (5 mg total) by mouth 2 (two) times daily before a meal. What changed: when to take this   hydrochlorothiazide 12.5 MG tablet Commonly known as: HYDRODIURIL Take 2 tablets by mouth daily.   Jardiance 25 MG Tabs tablet Generic drug: empagliflozin Take 25 mg by mouth every evening.  lisinopril 2.5 MG tablet Commonly known as: ZESTRIL Take 2.5 mg by mouth at bedtime.   ofloxacin 0.3 % ophthalmic solution Commonly known as: OCUFLOX Place 1 drop into the right eye 4 (four) times daily.   Ozempic (1 MG/DOSE) 4 MG/3ML Sopn Generic drug: Semaglutide (1 MG/DOSE) Inject 1 mg into the skin once a week.   pantoprazole 40 MG tablet Commonly known as: PROTONIX Take 40 mg by mouth every evening.   vitamin C 1000 MG tablet Take 1,000 mg by mouth daily.   VITAMIN D3  PO Take 1 capsule by mouth daily.        Allergies:  Allergies  Allergen Reactions   Duloxetine Other (See Comments)    "disoriented, confused, couldn't drive"   Gluten Meal     Celiac Disease   Codeine Rash    Family History  Problem Relation Age of Onset   Aneurysm Mother        brain   Hypertension Mother    Cancer Father    Heart disease Father    Diabetes Father    Hypertension Father    Hyperlipidemia Father    Cancer Maternal Grandmother        melanoma   Heart disease Paternal Grandfather    Colon cancer Neg Hx     Social History:  reports that he has never smoked. His smokeless tobacco use includes snuff. He reports that he does not currently use alcohol. He reports that he does not use drugs.  ROS: A complete review of systems was performed.  All systems are negative except for pertinent findings as noted.   Procedure: I first taught the patient penile anatomy.  I drew a diagram for him  Following this, he injected 10 mcg of prostaglandin e- 1 in his right corporal body.  I have reviewed prior pt notes    Impression/Assessment:  1.  ED, began injections today  2.  Peyronie's disease  3. lower urinary tract symptoms without obstruction although he does have a urethral stricture  Plan:  1.  He will increase his injections to 20 mcg, using a constriction band.  He will let us know of the results of that.  If that does not work effectively we will try 40 mcg  2.  I also gave him a coud tip 16 French red rubber catheter to use for distal urethral dilations once or twice a week  3.  I also gave him information on the [employer].  He may well need eventual IPP placement which would treat his ED as well as his Peyronie's disease

## 2021-10-02 LAB — OVA AND PARASITE EXAMINATION

## 2021-10-03 LAB — GI PROFILE, STOOL, PCR

## 2021-10-05 ENCOUNTER — Telehealth: Payer: Self-pay

## 2021-10-05 NOTE — Telephone Encounter (Signed)
-----   Message from Lucilla Lame, MD sent at 10/02/2021  4:05 PM EST ----- Left patient know that I am happy to inform him that the second test for any parasites were negative.

## 2021-10-05 NOTE — Telephone Encounter (Signed)
Pt notified of lab results

## 2021-10-13 ENCOUNTER — Other Ambulatory Visit: Payer: Self-pay | Admitting: *Deleted

## 2021-10-13 ENCOUNTER — Telehealth: Payer: Self-pay

## 2021-10-13 DIAGNOSIS — D649 Anemia, unspecified: Secondary | ICD-10-CM

## 2021-10-13 NOTE — Telephone Encounter (Signed)
Pt notified of lab results

## 2021-10-13 NOTE — Telephone Encounter (Signed)
-----   Message from [place] sent at 10/12/2021  3:38 PM EST ----- .

## 2021-10-14 ENCOUNTER — Inpatient Hospital Stay: Payer: BC Managed Care – PPO

## 2021-10-14 ENCOUNTER — Other Ambulatory Visit: Payer: Self-pay

## 2021-10-14 ENCOUNTER — Inpatient Hospital Stay (HOSPITAL_BASED_OUTPATIENT_CLINIC_OR_DEPARTMENT_OTHER): Payer: BC Managed Care – PPO | Admitting: Internal Medicine

## 2021-10-14 ENCOUNTER — Encounter: Payer: Self-pay | Admitting: Internal Medicine

## 2021-10-14 VITALS — BP 149/77 | HR 69

## 2021-10-14 DIAGNOSIS — D649 Anemia, unspecified: Secondary | ICD-10-CM

## 2021-10-14 LAB — BASIC METABOLIC PANEL
Anion gap: 6 (ref 5–15)
BUN: 35 mg/dL — ABNORMAL HIGH (ref 6–20)
CO2: 24 mmol/L (ref 22–32)
Calcium: 9.7 mg/dL (ref 8.9–10.3)
Chloride: 105 mmol/L (ref 98–111)
Creatinine, Ser: 2.16 mg/dL — ABNORMAL HIGH (ref 0.61–1.24)
GFR, Estimated: 34 mL/min — ABNORMAL LOW (ref >60)
Glucose, Bld: 327 mg/dL — ABNORMAL HIGH (ref 70–99)
Potassium: 3.8 mmol/L (ref 3.5–5.1)
Sodium: 135 mmol/L (ref 135–145)

## 2021-10-14 LAB — CBC WITH DIFFERENTIAL/PLATELET
Abs Immature Granulocytes: 0.02 10*3/uL (ref 0.00–0.07)
Basophils Absolute: 0 10*3/uL (ref 0.0–0.1)
Basophils Relative: 1 %
Eosinophils Absolute: 0.3 10*3/uL (ref 0.0–0.5)
Eosinophils Relative: 5 %
HCT: 36.4 % — ABNORMAL LOW (ref 39.0–52.0)
Hemoglobin: 11.9 g/dL — ABNORMAL LOW (ref 13.0–17.0)
Immature Granulocytes: 0 %
Lymphocytes Relative: 13 %
Lymphs Abs: 0.6 10*3/uL — ABNORMAL LOW (ref 0.7–4.0)
MCH: 29.5 pg (ref 26.0–34.0)
MCHC: 32.7 g/dL (ref 30.0–36.0)
MCV: 90.3 fL (ref 80.0–100.0)
Monocytes Absolute: 0.4 10*3/uL (ref 0.1–1.0)
Monocytes Relative: 9 %
Neutro Abs: 3.4 10*3/uL (ref 1.7–7.7)
Neutrophils Relative %: 72 %
Platelets: 187 10*3/uL (ref 150–400)
RBC: 4.03 MIL/uL — ABNORMAL LOW (ref 4.22–5.81)
RDW: 13.5 % (ref 11.5–15.5)
WBC: 4.8 10*3/uL (ref 4.0–10.5)
nRBC: 0 % (ref 0.0–0.2)

## 2021-10-14 LAB — FERRITIN: Ferritin: 301 ng/mL (ref 24–336)

## 2021-10-14 LAB — IRON AND TIBC
Iron: 84 ug/dL (ref 45–182)
Saturation Ratios: 23 % (ref 17.9–39.5)
TIBC: 365 ug/dL (ref 250–450)
UIBC: 281 ug/dL

## 2021-10-14 MED ORDER — IRON SUCROSE 20 MG/ML IV SOLN
200.0000 mg | Freq: Once | INTRAVENOUS | Status: AC
  Administered 2021-10-14: 200 mg via INTRAVENOUS
  Filled 2021-10-14: qty 10

## 2021-10-14 MED ORDER — SODIUM CHLORIDE 0.9 % IV SOLN
Freq: Once | INTRAVENOUS | Status: AC
Start: 2021-10-14 — End: 2021-10-14
  Filled 2021-10-14: qty 250

## 2021-10-14 MED ORDER — SODIUM CHLORIDE 0.9 % IV SOLN: 200.0000 mg | Freq: Once | INTRAVENOUS | Status: DC

## 2021-10-14 NOTE — Assessment & Plan Note (Addendum)
#  Anemia-symptomatic fatigue hemoglobin 9-10-unclear etiology; however suspect multifactorial-iron malabsorption/celiac disease/CKD-stage III.    S/p Venofer weekly x3 [Oct 2022]-hemoglobin improved from 9-10 to current Hb- 11.9; proceed with IV venofer.  Discussed that he will need likely maintenance IV iron every few months.  # Ckd stage III [GFR]-diabetes versus other causes. PCP/nephrology evaluation CT scan 2021 negative for any hydronephrosis.  # Fatigue: unlikely from anemia; ? Gabapentin; defer to PCP/ Endo  # Diabtes [[facility] Hb A1c- 6.2]-continue follow-up with endocrinology/monitoring of blood sugars.  # ?  History of celiac disease-defer to [facility]  # DISPOSITION: # Venofer today # follow up in 3 months- MD; labs- cbc/bmp;;possible venofer -Dr.B

## 2021-10-14 NOTE — Progress Notes (Signed)
Patient reports no improvement of his fatigue.

## 2021-10-14 NOTE — Progress Notes (Signed)
[place] CONSULT NOTE  Patient Care Team: Dion Body, MD as PCP - General (Family Medicine) [name], NP (Inactive) as Nurse Practitioner (Endocrinology)  CHIEF COMPLAINTS/PURPOSE OF CONSULTATION: ANEMIA   HEMATOLOGY HISTORY:  # ANEMIA PROGRESSIVE since 2020 12; 2022- 10 MCV- 90s; colonoscopy 2015; multiple endoscopies-last September 2021-biopsy active celiac disease   #CKD stage III [[facility]]/diabetes [[facility]]; ? Celaic disease [[facility]];    - Labs: 07/2014 - tTG IgA 101 03/2020 - positive endomysial Ab IgA, tTG IgA 78 - CSY: 07/2014 - normal appearing terminal ileum, sigmoid diverticulosis, and non-bleeding internal hemorrhoids - EGD: 07/2014 - mild non-erosive gastritis, normal appearing duodenal mucosa with duodenal biopsies showing celiac sprue - EGD: 06/03/2020 - procedure aborted due to presence of food - EGD: 08/12/2020 - irregular Z-line found at GEJ with bx showing mild reflux esophagitis, 1 cm hiatal hernia, mild chronic gastritis, decreased folds found in entire duodenum, flattening found in entire duodenum and scalloped mucosa with bx showing active Celiac disease with villous atrophy, increased intraepithelial lymphocytes, etc. Repeat in 1-year.  HISTORY OF PRESENTING ILLNESS: Alone.  Ambulating independently. Brian Nielsen 60 y.o.  male symptomatic anemia-is here for follow-up.  In the interim patient was evaluated by nephrology-recommended IV iron infusion. Patient s/p IV iron infusion x3 -energy levels improved but continues to be overall fatigued.  Otherwise no nausea no vomiting.  No fevers or chills.   Review of Systems  Constitutional:  Positive for malaise/fatigue and weight loss. Negative for chills, diaphoresis and fever.  HENT:  Negative for nosebleeds and sore throat.   Eyes:  Negative for double vision.  Respiratory:  Positive for shortness of breath. Negative for cough, hemoptysis, sputum production and  wheezing.   Cardiovascular:  Negative for chest pain, palpitations, orthopnea and leg swelling.  Gastrointestinal:  Positive for abdominal pain, constipation and diarrhea. Negative for blood in stool, heartburn, melena, nausea and vomiting.  Genitourinary:  Negative for dysuria, frequency and urgency.  Musculoskeletal:  Positive for back pain and joint pain.  Skin: Negative.  Negative for itching and rash.  Neurological:  Positive for weakness. Negative for dizziness, tingling, focal weakness and headaches.  Endo/Heme/Allergies:  Does not bruise/bleed easily.  Psychiatric/Behavioral:  Negative for depression. The patient is not nervous/anxious and does not have insomnia.    MEDICAL HISTORY:  Past Medical History:  Diagnosis Date   Adult celiac disease    Anemia    BPH (benign prostatic hyperplasia)    DM (diabetes mellitus) (HCC)    Type II   GERD (gastroesophageal reflux disease)    HTN (hypertension)    Hyperlipidemia    Thoracic spondylosis without myelopathy 11/22/2016    SURGICAL HISTORY: Past Surgical History:  Procedure Laterality Date   BIOPSY N/A 09/07/2021   Procedure: BIOPSY;  Surgeon: Lucilla Lame, MD;  Location: [place];  Service: Endoscopy;  Laterality: N/A;   COLONOSCOPY  2003   Dr. Laural Golden: two small polyps, small ext hemorrhoids.path:focal adenomatous changes   COLONOSCOPY N/A 05/03/2014   Procedure: COLONOSCOPY;  Surgeon: Danie Binder, MD;  [facility];  Service: Endoscopy;  Laterality: N/A;  12:00   COLONOSCOPY WITH PROPOFOL N/A 09/07/2021   Procedure: COLONOSCOPY WITH PROPOFOL;  Surgeon: Lucilla Lame, MD;  Location: [place];  Service: Endoscopy;  Laterality: N/A;   ESOPHAGOGASTRODUODENOSCOPY N/A 05/03/2014   Procedure: ESOPHAGOGASTRODUODENOSCOPY (EGD);  Surgeon: Danie Binder, MD;  [facility];  Service: Endoscopy;  Laterality: N/A;   ESOPHAGOGASTRODUODENOSCOPY (EGD) WITH  PROPOFOL N/A 06/03/2020   Procedure:  ESOPHAGOGASTRODUODENOSCOPY (EGD) WITH PROPOFOL;  Surgeon: [name], MD;  Location: [facility];  Service: Endoscopy;  Laterality: N/A;   ESOPHAGOGASTRODUODENOSCOPY (EGD) WITH PROPOFOL N/A 09/07/2021   Procedure: ESOPHAGOGASTRODUODENOSCOPY (EGD) WITH PROPOFOL;  Surgeon: Lucilla Lame, MD;  Location: [place];  Service: Endoscopy;  Laterality: N/A;  Diabetic   PARS PLANA VITRECTOMY Right 03/21/2021   Procedure: PARS PLANA VITRECTOMY 25 GAUGE WITH INJECTION OF ANTIBIOTICS FOR ENDOPHTHALMITIS;  Surgeon: Jalene Mullet, MD;  Location: [place];  Service: Ophthalmology;  Laterality: Right;   POLYPECTOMY N/A 09/07/2021   Procedure: POLYPECTOMY;  Surgeon: Lucilla Lame, MD;  Location: [place];  Service: Endoscopy;  Laterality: N/A;    SOCIAL HISTORY: Social History   Socioeconomic History   Marital status: Married    Spouse name: [name]   Number of children: 1   Years of education: 12   Highest education level: Not on file  Occupational History   Occupation: Programmer, systems: [employer]    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  Tobacco Use   Smoking status: Never   Smokeless tobacco: Current    Types: Snuff   Tobacco comments:    Quit x 30 years  Vaping Use   Vaping Use: Never used  Substance and Sexual Activity   Alcohol use: Not Currently    Comment: occasionally on weekends   Drug use: No   Sexual activity: Yes    Birth control/protection: Surgical  Other Topics Concern   Not on file  Social History Narrative   Lives with wife   [place]   Caffeine- diet Mtn Dew, 2 glasses   Social Determinants of Health   Financial Resource Strain: Not on file  Food Insecurity: Not on file  Transportation Needs: Not on file  Physical Activity: Not on file  Stress: Not on file  Social Connections: Not on file  Intimate Partner Violence: Not on file    FAMILY HISTORY: Family History  Problem Relation Age of Onset    Aneurysm Mother        brain   Hypertension Mother    Cancer Father    Heart disease Father    Diabetes Father    Hypertension Father    Hyperlipidemia Father    Cancer Maternal Grandmother        melanoma   Heart disease Paternal Grandfather    Colon cancer Neg Hx     ALLERGIES:  is allergic to duloxetine, gluten meal, and codeine.  MEDICATIONS:  Current Outpatient Medications  Medication Sig Dispense Refill   Ascorbic Acid (VITAMIN C) 1000 MG tablet Take 1,000 mg by mouth daily.     aspirin 81 MG EC tablet Take 81 mg by mouth daily.     atorvastatin (LIPITOR) 40 MG tablet Take 1 tablet (40 mg total) by mouth daily. (Patient taking differently: Take 40 mg by mouth every evening.) 90 tablet 3   B Complex Vitamins (B COMPLEX PO) Take 1 tablet by mouth daily.     Cholecalciferol (VITAMIN D3 PO) Take 1 capsule by mouth daily.     gabapentin (NEURONTIN) 300 MG capsule Take 3 capsules by mouth in the morning, at noon, and at bedtime.     glipiZIDE (GLUCOTROL) 5 MG tablet Take 1 tablet (5 mg total) by mouth 2 (two) times daily before a meal. (Patient taking differently: Take 5 mg by mouth daily before breakfast.) 60 tablet 0   hydrochlorothiazide (HYDRODIURIL)  12.5 MG tablet Take 2 tablets by mouth daily.     JARDIANCE 25 MG TABS tablet Take 25 mg by mouth every evening.     lisinopril (ZESTRIL) 10 MG tablet Take 1 tablet by mouth at bedtime.     OZEMPIC, 1 MG/DOSE, 4 MG/3ML SOPN Inject 1 mg into the skin once a week.     pantoprazole (PROTONIX) 40 MG tablet Take 40 mg by mouth every evening.     lisinopril (ZESTRIL) 2.5 MG tablet Take 2.5 mg by mouth at bedtime. (Patient not taking: Reported on 08/19/2021)     ofloxacin (OCUFLOX) 0.3 % ophthalmic solution Place 1 drop into the right eye 4 (four) times daily.     No current facility-administered medications for this visit.      PHYSICAL EXAMINATION:   Vitals:   10/14/21 1311  BP: (!) 146/89  Pulse: 67  Resp: 18  Temp: (!) 97.2  F (36.2 C)   Filed Weights   10/14/21 1311  Weight: 190 lb 9.6 oz (86.5 kg)    Physical Exam Vitals and nursing note reviewed.  Constitutional:      Comments: Ambulating: independently   with wife.   HENT:     Head: Normocephalic and atraumatic.     Mouth/Throat:     Pharynx: Oropharynx is clear.  Eyes:     Extraocular Movements: Extraocular movements intact.     Pupils: Pupils are equal, round, and reactive to light.  Cardiovascular:     Rate and Rhythm: Normal rate and regular rhythm.  Pulmonary:     Comments: Decreased breath sounds bilaterally.  Abdominal:     Palpations: Abdomen is soft.  Musculoskeletal:        General: Normal range of motion.     Cervical back: Normal range of motion.  Skin:    General: Skin is warm.  Neurological:     General: No focal deficit present.     Mental Status: He is alert and oriented to person, place, and time.  Psychiatric:        Behavior: Behavior normal.        Judgment: Judgment normal.    LABORATORY DATA:  I have reviewed the data as listed Lab Results  Component Value Date   WBC 4.8 10/14/2021   HGB 11.9 (L) 10/14/2021   HCT 36.4 (L) 10/14/2021   MCV 90.3 10/14/2021   PLT 187 10/14/2021   Recent Labs    05/28/21 1240 07/08/21 1444 10/14/21 1226  NA 139 134* 135  K 4.0 4.4 3.8  CL 103 104 105  CO2 28 25 24   GLUCOSE 153* 233* 327*  BUN 34* 30* 35*  CREATININE 1.84* 2.17* 2.16*  CALCIUM 9.8 9.7 9.7  GFRNONAA 42* 34* 34*  PROT 7.2  --   --   ALBUMIN 3.8  --   --   AST 26  --   --   ALT 27  --   --   ALKPHOS 85  --   --   BILITOT 0.6  --   --      No results found.  Symptomatic anemia #Anemia-symptomatic fatigue hemoglobin 9-10-unclear etiology; however suspect multifactorial-iron malabsorption/celiac disease/CKD-stage III.    S/p Venofer weekly x3 [Oct 2022]-hemoglobin improved from 9-10 to current Hb- 11.9; proceed with IV venofer.  Discussed that he will need likely maintenance IV iron every few  months.  # Ckd stage III [GFR]-diabetes versus other causes. PCP/nephrology evaluation CT scan 2021 negative for any hydronephrosis.  #  Fatigue: unlikely from anemia; ? Gabapentin; defer to PCP/ Endo  # Diabtes [[facility] Hb A1c- 6.2]-continue follow-up with endocrinology/monitoring of blood sugars.  # ?  History of celiac disease-defer to [facility]  # DISPOSITION: # Venofer today # follow up in 3 months- MD; labs- cbc/bmp;;possible venofer -Dr.B     All questions were answered. The patient knows to call the clinic with any problems, questions or concerns.      Cammie Sickle, MD 10/14/2021 3:39 PM

## 2021-10-15 ENCOUNTER — Ambulatory Visit: Payer: BC Managed Care – PPO | Admitting: Gastroenterology

## 2021-10-23 IMAGING — DX DG CHEST 2V
2 series · 2 of 2 positions shown · non-contrast
Comparison: CT abdomen and pelvis from May 29, 2020.

CLINICAL DATA: Cough shortness of breath and rib pain.

EXAM:
CHEST - 2 VIEW

[chest pa]
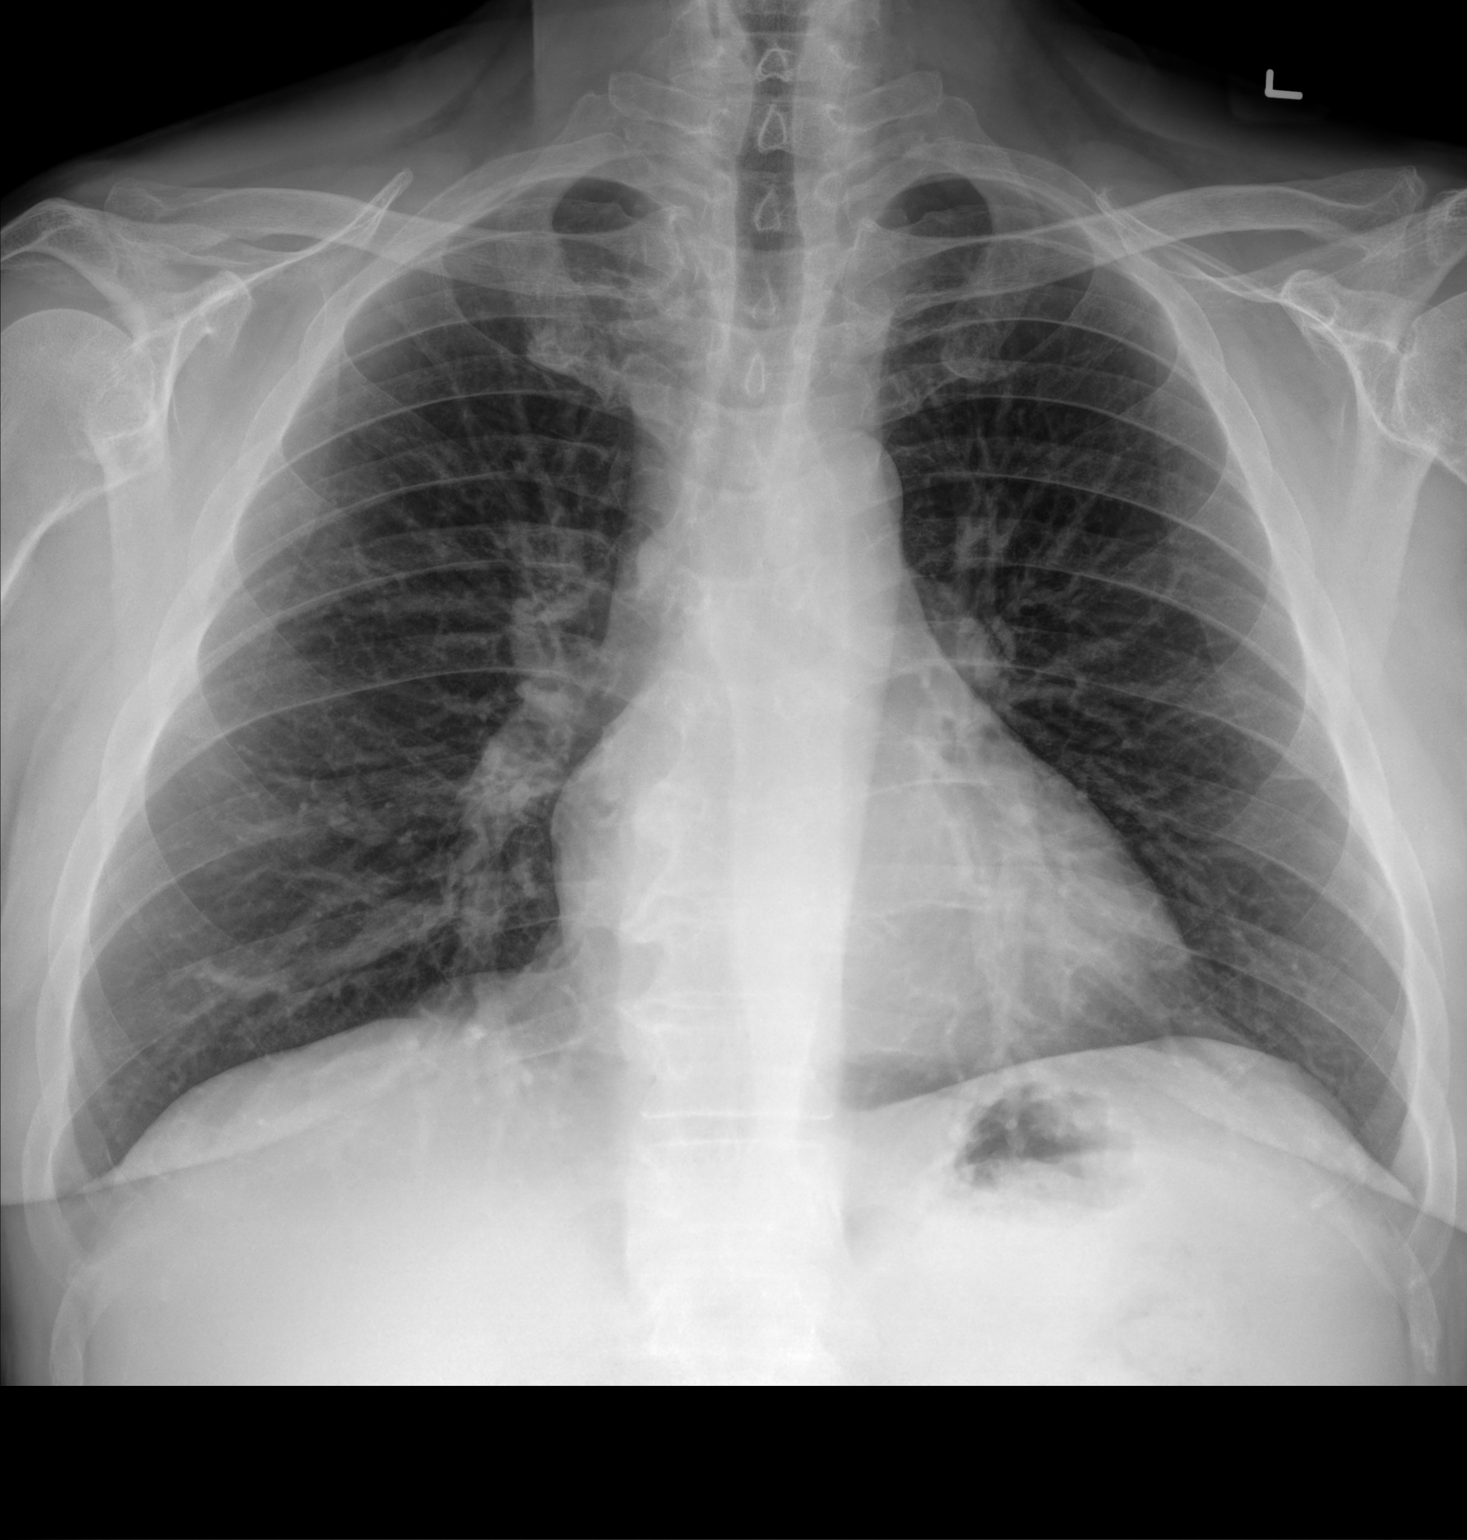

[chest lat]
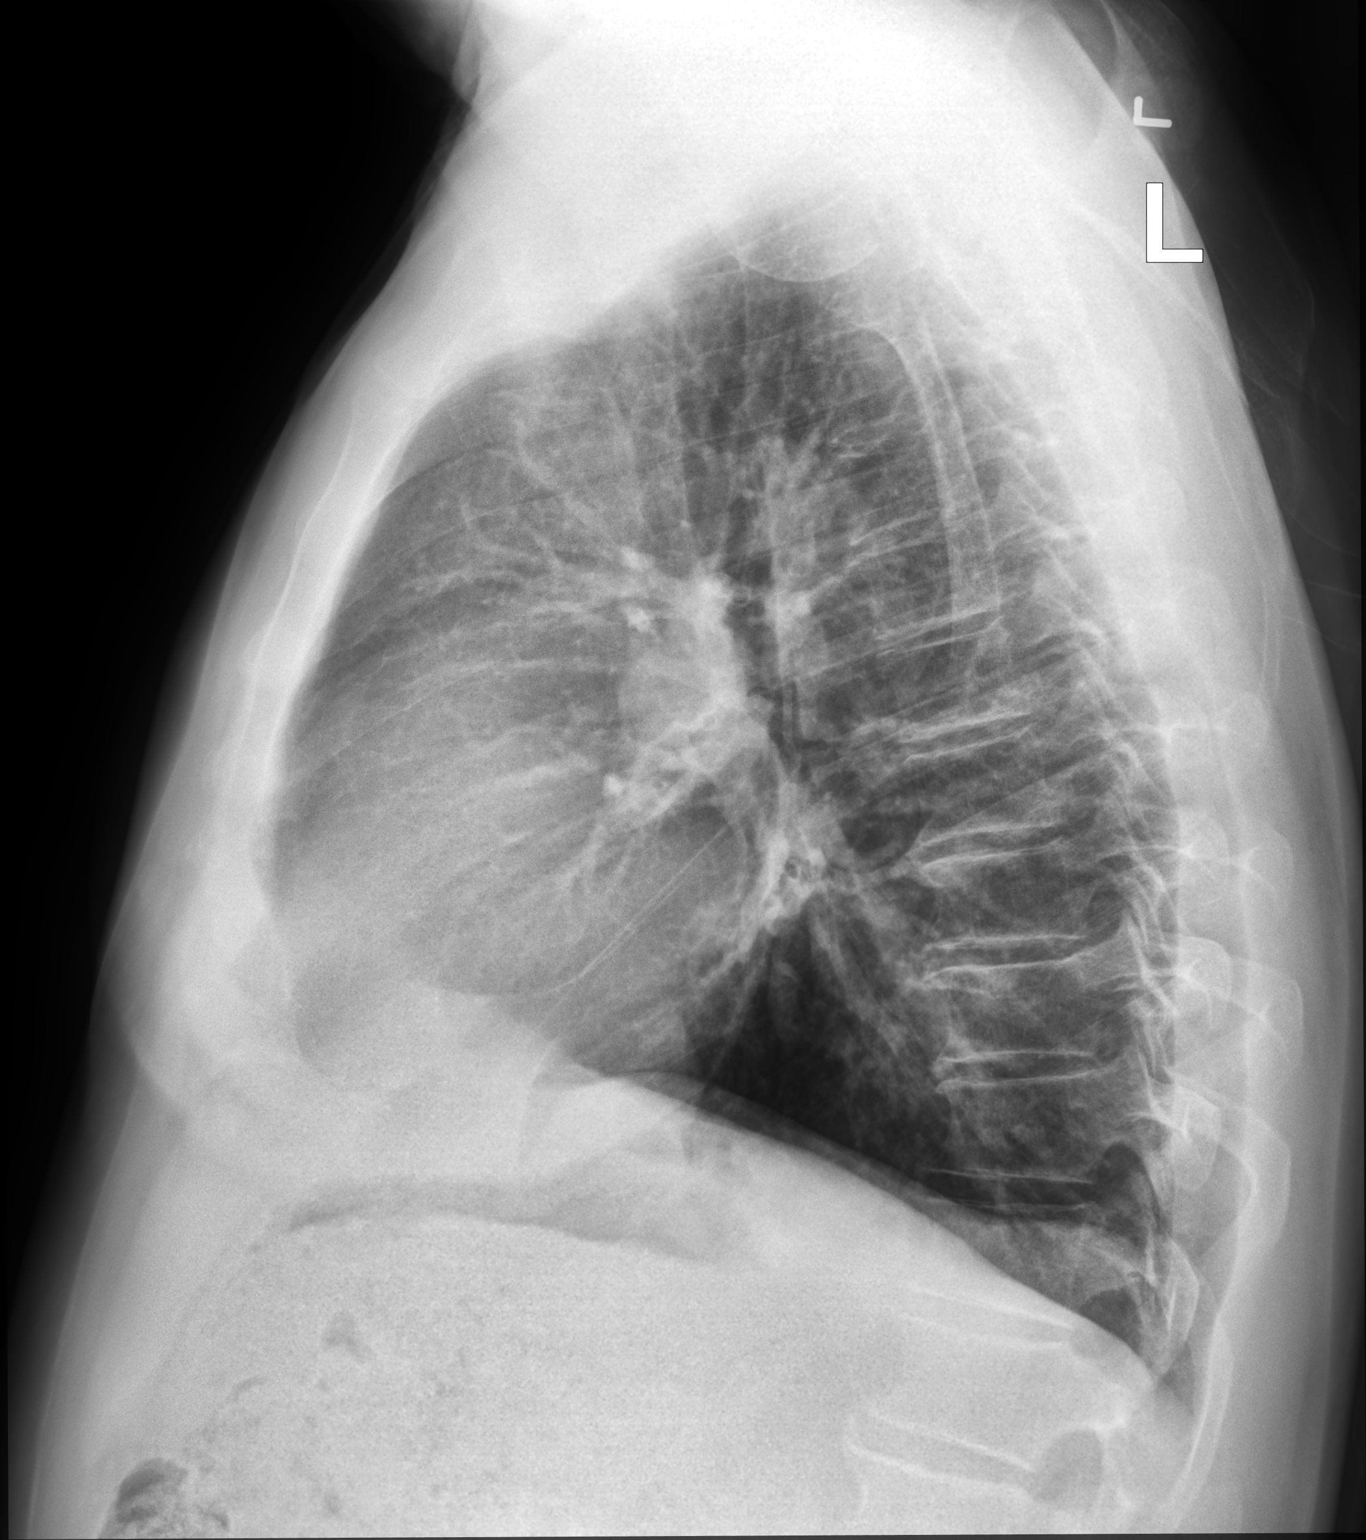

[2 of 2 positions shown; findings below may reference images not displayed]

FINDINGS: Trachea midline. Cardiomediastinal contours and hilar structures are
normal.

Lungs are clear. No sign of pneumothorax. No sign of pleural
effusion.

On limited assessment no acute skeletal process.
IMPRESSION: No acute cardiopulmonary disease.

## 2021-10-29 ENCOUNTER — Other Ambulatory Visit (HOSPITAL_COMMUNITY): Payer: Self-pay | Admitting: Nephrology

## 2021-10-29 DIAGNOSIS — D638 Anemia in other chronic diseases classified elsewhere: Secondary | ICD-10-CM

## 2021-10-29 DIAGNOSIS — E1122 Type 2 diabetes mellitus with diabetic chronic kidney disease: Secondary | ICD-10-CM

## 2021-11-11 DIAGNOSIS — I700 Atherosclerosis of aorta: Secondary | ICD-10-CM | POA: Insufficient documentation

## 2021-11-15 HISTORY — PX: CATARACT EXTRACTION W/ INTRAOCULAR LENS IMPLANT: SHX1309

## 2021-11-19 ENCOUNTER — Other Ambulatory Visit: Payer: Self-pay | Admitting: Student

## 2021-11-19 ENCOUNTER — Other Ambulatory Visit: Payer: Self-pay | Admitting: Radiology

## 2021-11-19 NOTE — H&P (Signed)
Chief Complaint: Progressive kidney disease  Referring Physician(s): Bradford Woods  Supervising Physician: Jacqulynn Cadet  Patient Status: [facility] - Out-pt  History of Present Illness: Brian Nielsen is a 61 y.o. male with proteinuria and progressive kidney disease.  Medical issues include type 2 DM and Hypertension.  Most recent creatinine is up to 2.16,  He is here today for random renal biopsy.  He is NPO.   Past Medical History:  Diagnosis Date   Adult celiac disease    Anemia    BPH (benign prostatic hyperplasia)    DM (diabetes mellitus) (HCC)    Type II   GERD (gastroesophageal reflux disease)    HTN (hypertension)    Hyperlipidemia    Thoracic spondylosis without myelopathy 11/22/2016    Past Surgical History:  Procedure Laterality Date   BIOPSY N/A 09/07/2021   Procedure: BIOPSY;  Surgeon: Lucilla Lame, MD;  Location: [place];  Service: Endoscopy;  Laterality: N/A;   COLONOSCOPY  2003   Dr. Laural Golden: two small polyps, small ext hemorrhoids.path:focal adenomatous changes   COLONOSCOPY N/A 05/03/2014   Procedure: COLONOSCOPY;  Surgeon: Danie Binder, MD;  [facility];  Service: Endoscopy;  Laterality: N/A;  12:00   COLONOSCOPY WITH PROPOFOL N/A 09/07/2021   Procedure: COLONOSCOPY WITH PROPOFOL;  Surgeon: Lucilla Lame, MD;  Location: [place];  Service: Endoscopy;  Laterality: N/A;   ESOPHAGOGASTRODUODENOSCOPY N/A 05/03/2014   Procedure: ESOPHAGOGASTRODUODENOSCOPY (EGD);  Surgeon: Danie Binder, MD;  [facility];  Service: Endoscopy;  Laterality: N/A;   ESOPHAGOGASTRODUODENOSCOPY (EGD) WITH PROPOFOL N/A 06/03/2020   Procedure: ESOPHAGOGASTRODUODENOSCOPY (EGD) WITH PROPOFOL;  Surgeon: [name], MD;  Location: [facility];  Service: Endoscopy;  Laterality: N/A;   ESOPHAGOGASTRODUODENOSCOPY (EGD) WITH PROPOFOL N/A 09/07/2021   Procedure: ESOPHAGOGASTRODUODENOSCOPY (EGD) WITH PROPOFOL;  Surgeon: Lucilla Lame,  MD;  Location: [place];  Service: Endoscopy;  Laterality: N/A;  Diabetic   PARS PLANA VITRECTOMY Right 03/21/2021   Procedure: PARS PLANA VITRECTOMY 25 GAUGE WITH INJECTION OF ANTIBIOTICS FOR ENDOPHTHALMITIS;  Surgeon: Jalene Mullet, MD;  Location: [place];  Service: Ophthalmology;  Laterality: Right;   POLYPECTOMY N/A 09/07/2021   Procedure: POLYPECTOMY;  Surgeon: Lucilla Lame, MD;  Location: [place];  Service: Endoscopy;  Laterality: N/A;    Allergies: Duloxetine, Gluten meal, and Codeine  Medications: Prior to Admission medications   Medication Sig Start Date End Date Taking? Authorizing Provider  albuterol (VENTOLIN HFA) 108 (90 Base) MCG/ACT inhaler Inhale 1-2 puffs into the lungs every 6 (six) hours as needed for wheezing or shortness of breath.   Yes [provider]  Ascorbic Acid (VITAMIN C) 1000 MG tablet Take 1,000 mg by mouth daily.   Yes [provider]  aspirin 81 MG EC tablet Take 81 mg by mouth daily.   Yes [provider]  atorvastatin (LIPITOR) 40 MG tablet Take 1 tablet (40 mg total) by mouth daily. Patient taking differently: Take 40 mg by mouth every evening. 10/21/16  Yes Raylene Everts, MD  B Complex Vitamins (B COMPLEX PO) Take 1 tablet by mouth daily.   Yes [provider]  Cholecalciferol (DIALYVITE VITAMIN D 5000) 125 MCG (5000 UT) capsule Take 5,000 Units by mouth daily.   Yes [provider]  Digestive Enzymes (MULTI-ENZYME) TABS Take 1 tablet by mouth daily.   Yes [provider]  docusate sodium (COLACE) 100 MG capsule Take 200-300 mg by mouth daily as needed for mild constipation.  Yes [provider]  fenofibrate 160 MG tablet Take 160 mg by mouth daily.   Yes [provider]  glipiZIDE (GLUCOTROL) 5 MG tablet Take 1 tablet (5 mg total) by mouth 2 (two) times daily before a meal. Patient taking differently: Take 5 mg by mouth daily before breakfast. 01/12/17  Yes  Raylene Everts, MD  hydrochlorothiazide (HYDRODIURIL) 12.5 MG tablet Take 12.5 mg by mouth 2 (two) times daily. 05/19/21  Yes [provider]  ibuprofen (ADVIL) 200 MG tablet Take 600-800 mg by mouth every 8 (eight) hours as needed for moderate pain.   Yes [provider]  JARDIANCE 25 MG TABS tablet Take 25 mg by mouth every evening. 09/08/19  Yes [provider]  lisinopril (ZESTRIL) 10 MG tablet Take 10 mg by mouth at bedtime. 09/29/21 09/29/22 Yes [provider]  Multiple Vitamin (MULTIVITAMIN WITH MINERALS) TABS tablet Take 1 tablet by mouth daily.   Yes [provider]  ofloxacin (OCUFLOX) 0.3 % ophthalmic solution Place 1-2 drops into both eyes daily as needed (eye injections). 05/22/21  Yes [provider]  pantoprazole (PROTONIX) 40 MG tablet Take 40 mg by mouth every evening. 09/08/19  Yes [provider]  pregabalin (LYRICA) 150 MG capsule Take 150 mg by mouth 2 (two) times daily.   Yes [provider]  Semaglutide, 1 MG/DOSE, (OZEMPIC, 1 MG/DOSE,) 2 MG/1.5ML SOPN Inject 2 mg into the skin every Friday.   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  Tobacco Use   Smoking status: Never   Smokeless tobacco: Current    Types: Snuff   Tobacco comments:    Quit x 30 years  Vaping Use   Vaping Use: Never used   Substance and Sexual Activity   Alcohol use: Not Currently    Comment: occasionally on weekends   Drug use: No   Sexual activity: Yes    Birth control/protection: Surgical  Other Topics Concern   Not on file  Social History Narrative   Lives with wife   [place] with [place]/chickens   Caffeine- diet Mtn Dew, 2 glasses   Social Determinants of Health   Financial Resource Strain: Not on file  Food Insecurity: Not on file  Transportation Needs: Not on file  Physical Activity: Not on file  Stress: Not on file  Social Connections: Not on file     Review of Systems: A 12 point ROS discussed and pertinent positives are indicated in the HPI above.  All other systems are negative.  Review of Systems  Vital Signs: BP (!) 143/71    Pulse 63    Temp 98 F (36.7 C) (Oral)  Resp 16    Ht 5' 8"  (1.727 m)    Wt 79.4 kg    SpO2 98%    BMI 26.61 kg/m   Physical Exam Vitals reviewed.  Constitutional:      Appearance: Normal appearance.  HENT:     Head: Normocephalic and atraumatic.  Eyes:     Extraocular Movements: Extraocular movements intact.  Cardiovascular:     Rate and Rhythm: Normal rate and regular rhythm.  Pulmonary:     Effort: Pulmonary effort is normal. No respiratory distress.     Breath sounds: Normal breath sounds.  Abdominal:     General: There is no distension.     Palpations: Abdomen is soft.     Tenderness: There is no abdominal tenderness.  Musculoskeletal:        General: Normal range of motion.     Cervical back: Normal range of motion.  Skin:    General: Skin is warm and dry.  Neurological:     General: No focal deficit present.     Mental Status: He is alert and oriented to person, place, and time.  Psychiatric:        Mood and Affect: Mood normal.        Behavior: Behavior normal.        Thought Content: Thought content normal.        Judgment: Judgment normal.    Imaging: No results found.  Labs:  CBC: Recent Labs     05/28/21 1240 07/08/21 1444 10/14/21 1226 11/20/21 0630  WBC 5.4 4.8 4.8 4.5  HGB 10.9* 10.4* 11.9* 11.9*  HCT 33.1* 31.7* 36.4* 36.8*  PLT 214 201 187 250    COAGS: No results for input(s): INR, APTT in the last 8760 hours.  BMP: Recent Labs    05/12/21 1650 05/28/21 1240 07/08/21 1444 10/14/21 1226  NA 141 139 134* 135  K 4.5 4.0 4.4 3.8  CL 115* 103 104 105  CO2 24 28 25 24   GLUCOSE 144* 153* 233* 327*  BUN 24* 34* 30* 35*  CALCIUM 8.4* 9.8 9.7 9.7  CREATININE 1.31* 1.84* 2.17* 2.16*  GFRNONAA >60 42* 34* 34*    LIVER FUNCTION TESTS: Recent Labs    05/28/21 1240  BILITOT 0.6  AST 26  ALT 27  ALKPHOS 85  PROT 7.2  ALBUMIN 3.8    TUMOR MARKERS: No results for input(s): AFPTM, CEA, CA199, CHROMGRNA in the last 8760 hours.  Assessment and Plan:  Proteinuria and progressive kidney disease presumably from hypertension or diabetic nephropathy.  Will proceed with image guided random renal biopsy tomorrow by Dr. Laurence Ferrari.  Risks and benefits of random renal biopsy. was discussed with the patient and/or patient's family including, but not limited to bleeding, infection, damage to adjacent structures or low yield requiring additional tests.  All of the questions were answered and there is agreement to proceed.  Consent signed and in chart.  Thank you for allowing our service to participate in SHERRILL MCKAMIE 's care.  Electronically Signed: Murrell Redden, PA-C   11/20/2021, 7:22 AM      I spent a total of  30 Minutes   in face to face in clinical consultation, greater than 50% of which was counseling/coordinating care for random renal biopsy.

## 2021-11-20 ENCOUNTER — Ambulatory Visit (HOSPITAL_COMMUNITY)
Admission: RE | Admit: 2021-11-20 | Discharge: 2021-11-20 | Disposition: A | Discharge status: Home / Self Care | Payer: BC Managed Care – PPO | Source: Ambulatory Visit | Attending: Nephrology | Admitting: Nephrology

## 2021-11-20 ENCOUNTER — Encounter (HOSPITAL_COMMUNITY): Payer: Self-pay

## 2021-11-20 DIAGNOSIS — D638 Anemia in other chronic diseases classified elsewhere: Secondary | ICD-10-CM | POA: Insufficient documentation

## 2021-11-20 DIAGNOSIS — N1832 Chronic kidney disease, stage 3b: Secondary | ICD-10-CM | POA: Diagnosis not present

## 2021-11-20 DIAGNOSIS — R809 Proteinuria, unspecified: Secondary | ICD-10-CM | POA: Insufficient documentation

## 2021-11-20 DIAGNOSIS — Z560 Unemployment, unspecified: Secondary | ICD-10-CM | POA: Insufficient documentation

## 2021-11-20 DIAGNOSIS — I129 Hypertensive chronic kidney disease with stage 1 through stage 4 chronic kidney disease, or unspecified chronic kidney disease: Secondary | ICD-10-CM | POA: Insufficient documentation

## 2021-11-20 DIAGNOSIS — E1122 Type 2 diabetes mellitus with diabetic chronic kidney disease: Secondary | ICD-10-CM | POA: Insufficient documentation

## 2021-11-20 DIAGNOSIS — N139 Obstructive and reflux uropathy, unspecified: Secondary | ICD-10-CM | POA: Diagnosis not present

## 2021-11-20 DIAGNOSIS — N183 Chronic kidney disease, stage 3 unspecified: Secondary | ICD-10-CM | POA: Diagnosis present

## 2021-11-20 LAB — CBC
HCT: 36.8 % — ABNORMAL LOW (ref 39.0–52.0)
Hemoglobin: 11.9 g/dL — ABNORMAL LOW (ref 13.0–17.0)
MCH: 29.2 pg (ref 26.0–34.0)
MCHC: 32.3 g/dL (ref 30.0–36.0)
MCV: 90.4 fL (ref 80.0–100.0)
Platelets: 250 10*3/uL (ref 150–400)
RBC: 4.07 MIL/uL — ABNORMAL LOW (ref 4.22–5.81)
RDW: 12.8 % (ref 11.5–15.5)
WBC: 4.5 10*3/uL (ref 4.0–10.5)
nRBC: 0 % (ref 0.0–0.2)

## 2021-11-20 LAB — GLUCOSE, CAPILLARY
Glucose-Capillary: 357 mg/dL — ABNORMAL HIGH (ref 70–99)
Glucose-Capillary: 290 mg/dL — ABNORMAL HIGH (ref 70–99)

## 2021-11-20 LAB — PROTIME-INR
INR: 1 (ref 0.8–1.2)
Prothrombin Time: 13.3 seconds (ref 11.4–15.2)

## 2021-11-20 MED ORDER — ACETAMINOPHEN 325 MG PO TABS
650.0000 mg | ORAL_TABLET | Freq: Once | ORAL | Status: AC
  Administered 2021-11-20: 650 mg via ORAL
  Filled 2021-11-20: qty 2

## 2021-11-20 MED ORDER — GELATIN ABSORBABLE 12-7 MM EX MISC
CUTANEOUS | Status: AC
  Filled 2021-11-20: qty 1

## 2021-11-20 MED ORDER — SODIUM CHLORIDE 0.9 % IV SOLN: INTRAVENOUS | Status: DC

## 2021-11-20 MED ORDER — LIDOCAINE HCL (PF) 1 % IJ SOLN
INTRAMUSCULAR | Status: AC
  Filled 2021-11-20: qty 30

## 2021-11-20 MED ORDER — SODIUM CHLORIDE 0.9 % IV SOLN
INTRAVENOUS | Status: AC | PRN
  Administered 2021-11-20: 10 mL/h via INTRAVENOUS

## 2021-11-20 MED ORDER — MIDAZOLAM HCL 2 MG/2ML IJ SOLN
INTRAMUSCULAR | Status: AC | PRN
Start: 2021-11-20 — End: 2021-11-20
  Administered 2021-11-20 (×2): .5 mg via INTRAVENOUS
  Administered 2021-11-20: 1 mg via INTRAVENOUS

## 2021-11-20 MED ORDER — MIDAZOLAM HCL 2 MG/2ML IJ SOLN
INTRAMUSCULAR | Status: AC
  Filled 2021-11-20: qty 2

## 2021-11-20 MED ORDER — FENTANYL CITRATE (PF) 100 MCG/2ML IJ SOLN
INTRAMUSCULAR | Status: AC
  Filled 2021-11-20: qty 2

## 2021-11-20 MED ORDER — FENTANYL CITRATE (PF) 100 MCG/2ML IJ SOLN
INTRAMUSCULAR | Status: AC | PRN
  Administered 2021-11-20 (×2): 25 ug via INTRAVENOUS
  Administered 2021-11-20: 50 ug via INTRAVENOUS

## 2021-11-20 NOTE — Procedures (Signed)
Interventional Radiology Procedure Note  Procedure: US guided random renal biopsy  Complications: None  Estimated Blood Loss: None  Recommendations: - Bedrest x 4 hrs - DC home   Signed,  Criselda Peaches, MD

## 2021-11-25 LAB — SURGICAL PATHOLOGY

## 2021-12-01 ENCOUNTER — Encounter (HOSPITAL_COMMUNITY): Payer: Self-pay

## 2021-12-07 ENCOUNTER — Telehealth: Payer: Self-pay

## 2021-12-07 NOTE — Telephone Encounter (Signed)
Patient left a voicemail:  Patient's 3 shots did not help. Patient says it's too weak.  Call back:  [phone] Jerilynn Mages)   Thanks, Helene Kelp

## 2021-12-09 ENCOUNTER — Other Ambulatory Visit: Payer: Self-pay

## 2021-12-09 ENCOUNTER — Other Ambulatory Visit (HOSPITAL_COMMUNITY)
Admission: RE | Admit: 2021-12-09 | Discharge: 2021-12-09 | Disposition: A | Discharge status: Home / Self Care | Payer: BC Managed Care – PPO | Source: Ambulatory Visit | Attending: Nephrology | Admitting: Nephrology

## 2021-12-09 DIAGNOSIS — E1129 Type 2 diabetes mellitus with other diabetic kidney complication: Secondary | ICD-10-CM | POA: Diagnosis present

## 2021-12-09 DIAGNOSIS — R809 Proteinuria, unspecified: Secondary | ICD-10-CM | POA: Diagnosis present

## 2021-12-09 DIAGNOSIS — D638 Anemia in other chronic diseases classified elsewhere: Secondary | ICD-10-CM | POA: Diagnosis present

## 2021-12-09 DIAGNOSIS — E1122 Type 2 diabetes mellitus with diabetic chronic kidney disease: Secondary | ICD-10-CM | POA: Diagnosis present

## 2021-12-09 DIAGNOSIS — N189 Chronic kidney disease, unspecified: Secondary | ICD-10-CM | POA: Diagnosis present

## 2021-12-09 LAB — CBC
HCT: 34.8 % — ABNORMAL LOW (ref 39.0–52.0)
Hemoglobin: 11.4 g/dL — ABNORMAL LOW (ref 13.0–17.0)
MCH: 30.4 pg (ref 26.0–34.0)
MCHC: 32.8 g/dL (ref 30.0–36.0)
MCV: 92.8 fL (ref 80.0–100.0)
Platelets: 231 10*3/uL (ref 150–400)
RBC: 3.75 MIL/uL — ABNORMAL LOW (ref 4.22–5.81)
RDW: 13.9 % (ref 11.5–15.5)
WBC: 4.6 10*3/uL (ref 4.0–10.5)
nRBC: 0 % (ref 0.0–0.2)

## 2021-12-09 LAB — RENAL FUNCTION PANEL
Albumin: 3.6 g/dL (ref 3.5–5.0)
Anion gap: 8 (ref 5–15)
BUN: 37 mg/dL — ABNORMAL HIGH (ref 6–20)
CO2: 19 mmol/L — ABNORMAL LOW (ref 22–32)
Calcium: 8.2 mg/dL — ABNORMAL LOW (ref 8.9–10.3)
Chloride: 109 mmol/L (ref 98–111)
Creatinine, Ser: 2.2 mg/dL — ABNORMAL HIGH (ref 0.61–1.24)
GFR, Estimated: 33 mL/min — ABNORMAL LOW (ref >60)
Glucose, Bld: 355 mg/dL — ABNORMAL HIGH (ref 70–99)
Phosphorus: 4 mg/dL (ref 2.5–4.6)
Potassium: 3.7 mmol/L (ref 3.5–5.1)
Sodium: 136 mmol/L (ref 135–145)

## 2021-12-09 LAB — PROTEIN / CREATININE RATIO, URINE
Creatinine, Urine: 103.9 mg/dL
Protein Creatinine Ratio: 2.53 mg/mg{Cre} — ABNORMAL HIGH (ref 0.00–0.15)
Total Protein, Urine: 263 mg/dL

## 2021-12-09 LAB — IRON AND TIBC
Iron: 64 ug/dL (ref 45–182)
Saturation Ratios: 14 % — ABNORMAL LOW (ref 17.9–39.5)
TIBC: 442 ug/dL (ref 250–450)
UIBC: 378 ug/dL

## 2021-12-09 LAB — FERRITIN: Ferritin: 316 ng/mL (ref 24–336)

## 2021-12-09 LAB — VITAMIN D 25 HYDROXY (VIT D DEFICIENCY, FRACTURES): Vit D, 25-Hydroxy: 22.66 ng/mL — ABNORMAL LOW (ref 30–100)

## 2021-12-11 LAB — PTH, INTACT AND CALCIUM
Calcium, Total (PTH): 8.5 mg/dL — ABNORMAL LOW (ref 8.6–10.2)
PTH: 55 pg/mL (ref 15–65)

## 2021-12-11 NOTE — Telephone Encounter (Signed)
I called and spoke with patient. Patient is unsure of current dosage of his injections.  We will reach out to patients pharmacy to inquire about patient rx and dosing that patient is currently taking.

## 2021-12-16 NOTE — Telephone Encounter (Signed)
Spoke with [facility]- last Rx was prostaglandin 54mg/ml  Patient reports injection is too weak. Please advise

## 2021-12-18 MED ORDER — AMBULATORY NON FORMULARY MEDICATION: 11 refills | Status: DC

## 2021-12-18 NOTE — Telephone Encounter (Signed)
Franchot Gallo, MD  You 54 minutes ago (12:17 PM)   Please call customer care and have them increase his doses to 30 mcg, #5 doses with 11 refills.     Order faxed to [place]

## 2021-12-23 ENCOUNTER — Other Ambulatory Visit: Payer: Self-pay

## 2021-12-23 MED ORDER — PANTOPRAZOLE SODIUM 40 MG PO TBEC: 40.0000 mg | DELAYED_RELEASE_TABLET | Freq: Every evening | ORAL | 6 refills | Status: DC

## 2022-01-12 ENCOUNTER — Encounter: Payer: Self-pay | Admitting: Internal Medicine

## 2022-01-13 ENCOUNTER — Inpatient Hospital Stay (HOSPITAL_BASED_OUTPATIENT_CLINIC_OR_DEPARTMENT_OTHER): Payer: BC Managed Care – PPO | Admitting: Internal Medicine

## 2022-01-13 ENCOUNTER — Other Ambulatory Visit: Payer: Self-pay

## 2022-01-13 ENCOUNTER — Inpatient Hospital Stay: Payer: BC Managed Care – PPO | Attending: Internal Medicine

## 2022-01-13 ENCOUNTER — Inpatient Hospital Stay: Payer: BC Managed Care – PPO

## 2022-01-13 ENCOUNTER — Encounter: Payer: Self-pay | Admitting: Internal Medicine

## 2022-01-13 ENCOUNTER — Other Ambulatory Visit: Payer: Self-pay | Admitting: *Deleted

## 2022-01-13 DIAGNOSIS — D649 Anemia, unspecified: Secondary | ICD-10-CM

## 2022-01-13 DIAGNOSIS — K909 Intestinal malabsorption, unspecified: Secondary | ICD-10-CM | POA: Insufficient documentation

## 2022-01-13 DIAGNOSIS — D508 Other iron deficiency anemias: Secondary | ICD-10-CM | POA: Insufficient documentation

## 2022-01-13 LAB — BASIC METABOLIC PANEL
Anion gap: 5 (ref 5–15)
BUN: 44 mg/dL — ABNORMAL HIGH (ref 6–20)
CO2: 23 mmol/L (ref 22–32)
Calcium: 8.5 mg/dL — ABNORMAL LOW (ref 8.9–10.3)
Chloride: 108 mmol/L (ref 98–111)
Creatinine, Ser: 2.62 mg/dL — ABNORMAL HIGH (ref 0.61–1.24)
GFR, Estimated: 27 mL/min — ABNORMAL LOW (ref >60)
Glucose, Bld: 339 mg/dL — ABNORMAL HIGH (ref 70–99)
Potassium: 3.9 mmol/L (ref 3.5–5.1)
Sodium: 136 mmol/L (ref 135–145)

## 2022-01-13 LAB — CBC WITH DIFFERENTIAL/PLATELET
Abs Immature Granulocytes: 0.02 10*3/uL (ref 0.00–0.07)
Basophils Absolute: 0 10*3/uL (ref 0.0–0.1)
Basophils Relative: 1 %
Eosinophils Absolute: 0.3 10*3/uL (ref 0.0–0.5)
Eosinophils Relative: 6 %
HCT: 35.6 % — ABNORMAL LOW (ref 39.0–52.0)
Hemoglobin: 11.4 g/dL — ABNORMAL LOW (ref 13.0–17.0)
Immature Granulocytes: 0 %
Lymphocytes Relative: 13 %
Lymphs Abs: 0.6 10*3/uL — ABNORMAL LOW (ref 0.7–4.0)
MCH: 29.2 pg (ref 26.0–34.0)
MCHC: 32 g/dL (ref 30.0–36.0)
MCV: 91.3 fL (ref 80.0–100.0)
Monocytes Absolute: 0.5 10*3/uL (ref 0.1–1.0)
Monocytes Relative: 11 %
Neutro Abs: 3.2 10*3/uL (ref 1.7–7.7)
Neutrophils Relative %: 69 %
Platelets: 214 10*3/uL (ref 150–400)
RBC: 3.9 MIL/uL — ABNORMAL LOW (ref 4.22–5.81)
RDW: 13.1 % (ref 11.5–15.5)
WBC: 4.6 10*3/uL (ref 4.0–10.5)
nRBC: 0 % (ref 0.0–0.2)

## 2022-01-13 LAB — IRON AND TIBC
Iron: 76 ug/dL (ref 45–182)
Saturation Ratios: 17 % — ABNORMAL LOW (ref 17.9–39.5)
TIBC: 445 ug/dL (ref 250–450)
UIBC: 369 ug/dL

## 2022-01-13 LAB — FERRITIN: Ferritin: 237 ng/mL (ref 24–336)

## 2022-01-13 MED ORDER — SODIUM CHLORIDE 0.9 % IV SOLN
Freq: Once | INTRAVENOUS | Status: AC
  Filled 2022-01-13: qty 250

## 2022-01-13 MED ORDER — IRON SUCROSE 20 MG/ML IV SOLN
200.0000 mg | Freq: Once | INTRAVENOUS | Status: AC
  Administered 2022-01-13: 200 mg via INTRAVENOUS
  Filled 2022-01-13: qty 10

## 2022-01-13 MED ORDER — SODIUM CHLORIDE 0.9 % IV SOLN: 200.0000 mg | Freq: Once | INTRAVENOUS | Status: DC

## 2022-01-13 NOTE — Assessment & Plan Note (Addendum)
#  Anemia-symptomatic fatigue hemoglobin 9-10-unclear etiology; however suspect multifactorial-iron malabsorption/celiac disease/CKD-stage III.   ? ?# Current Hb- 11.4; proceed with IV venofer today.  Discussed that he will need likely maintenance IV iron every few months. ? ?# Ckd stage III [GFR]-diabetes versus other causes. PCP/nephrology evaluation CT scan 2021 negative for any hydronephrosis [[place]]. ? ?# Fatigue: unlikely from anemia; ? Gabapentin; defer to PCP/ Endo ? ?# Diabtes [[facility] Hb A1c- 6.2]-PBG 335-continue follow-up with endocrinology/monitoring of blood sugars. ? ?# ?  History of celiac disease-defer to [facility] ? ?# DISPOSITION: ?# Venofer today ?# follow up in 3 months- MD; labs- cbc/bmp;iron studies;ferritin-;possible venofer -Dr.B ? ?

## 2022-01-13 NOTE — Progress Notes (Signed)
[facility] CONSULT NOTE  Patient Care Team: Dion Body, MD as PCP - General (Family Medicine) [name], NP (Inactive) as Nurse Practitioner (Endocrinology)  CHIEF COMPLAINTS/PURPOSE OF CONSULTATION: ANEMIA   HEMATOLOGY HISTORY:  # ANEMIA PROGRESSIVE since 2020 12; 2022- 10 MCV- 90s; colonoscopy 2015; multiple endoscopies-last September 2021-biopsy active celiac disease   #CKD stage III [[facility]]/diabetes [[facility]]; ? Celaic disease [[facility]];    - Labs: 07/2014 - tTG IgA 101 03/2020 - positive endomysial Ab IgA, tTG IgA 78 - CSY: 07/2014 - normal appearing terminal ileum, sigmoid diverticulosis, and non-bleeding internal hemorrhoids - EGD: 07/2014 - mild non-erosive gastritis, normal appearing duodenal mucosa with duodenal biopsies showing celiac sprue - EGD: 06/03/2020 - procedure aborted due to presence of food - EGD: 08/12/2020 - irregular Z-line found at [facility] with bx showing mild reflux esophagitis, 1 cm hiatal hernia, mild chronic gastritis, decreased folds found in entire duodenum, flattening found in entire duodenum and scalloped mucosa with bx showing active Celiac disease with villous atrophy, increased intraepithelial lymphocytes, etc. Repeat in 1-year.  HISTORY OF PRESENTING ILLNESS: Alone.  Ambulating independently.  Brian Nielsen 61 y.o.  male symptomatic anemia-is here for follow-up.  Patient complains of chronic fatigue.  Chronic GI complaints.  No fever no chills. Otherwise no nausea no vomiting.  No fevers or chills.   Review of Systems  Constitutional:  Positive for malaise/fatigue and weight loss. Negative for chills, diaphoresis and fever.  HENT:  Negative for nosebleeds and sore throat.   Eyes:  Negative for double vision.  Respiratory:  Positive for shortness of breath. Negative for cough, hemoptysis, sputum production and wheezing.   Cardiovascular:  Negative for chest pain, palpitations, orthopnea and leg swelling.   Gastrointestinal:  Negative for blood in stool, heartburn, melena, nausea and vomiting.  Genitourinary:  Negative for dysuria, frequency and urgency.  Musculoskeletal:  Positive for back pain and joint pain.  Skin: Negative.  Negative for itching and rash.  Neurological:  Positive for weakness. Negative for dizziness, tingling, focal weakness and headaches.  Endo/Heme/Allergies:  Does not bruise/bleed easily.  Psychiatric/Behavioral:  Negative for depression. The patient is not nervous/anxious and does not have insomnia.    MEDICAL HISTORY:  Past Medical History:  Diagnosis Date   Adult celiac disease    Anemia    BPH (benign prostatic hyperplasia)    DM (diabetes mellitus) (HCC)    Type II   GERD (gastroesophageal reflux disease)    HTN (hypertension)    Hyperlipidemia    Thoracic spondylosis without myelopathy 11/22/2016    SURGICAL HISTORY: Past Surgical History:  Procedure Laterality Date   BIOPSY N/A 09/07/2021   Procedure: BIOPSY;  Surgeon: Lucilla Lame, MD;  Location: [place];  Service: Endoscopy;  Laterality: N/A;   COLONOSCOPY  2003   Dr. Laural Golden: two small polyps, small ext hemorrhoids.path:focal adenomatous changes   COLONOSCOPY N/A 05/03/2014   Procedure: COLONOSCOPY;  Surgeon: Danie Binder, MD;  [facility];  Service: Endoscopy;  Laterality: N/A;  12:00   COLONOSCOPY WITH PROPOFOL N/A 09/07/2021   Procedure: COLONOSCOPY WITH PROPOFOL;  Surgeon: Lucilla Lame, MD;  Location: [place];  Service: Endoscopy;  Laterality: N/A;   ESOPHAGOGASTRODUODENOSCOPY N/A 05/03/2014   Procedure: ESOPHAGOGASTRODUODENOSCOPY (EGD);  Surgeon: Danie Binder, MD;  [facility];  Service: Endoscopy;  Laterality: N/A;   ESOPHAGOGASTRODUODENOSCOPY (EGD) WITH PROPOFOL N/A 06/03/2020   Procedure: ESOPHAGOGASTRODUODENOSCOPY (EGD) WITH PROPOFOL;  Surgeon: [name], MD;  Location: [facility];  Service: Endoscopy;  Laterality: N/A;    ESOPHAGOGASTRODUODENOSCOPY (EGD) WITH PROPOFOL N/A 09/07/2021   Procedure: ESOPHAGOGASTRODUODENOSCOPY (EGD) WITH PROPOFOL;  Surgeon: Lucilla Lame, MD;  Location: [place];  Service: Endoscopy;  Laterality: N/A;  Diabetic   PARS PLANA VITRECTOMY Right 03/21/2021   Procedure: PARS PLANA VITRECTOMY 25 GAUGE WITH INJECTION OF ANTIBIOTICS FOR ENDOPHTHALMITIS;  Surgeon: Jalene Mullet, MD;  Location: [place];  Service: Ophthalmology;  Laterality: Right;   POLYPECTOMY N/A 09/07/2021   Procedure: POLYPECTOMY;  Surgeon: Lucilla Lame, MD;  Location: [place];  Service: Endoscopy;  Laterality: N/A;    SOCIAL HISTORY: Social History   Socioeconomic History   Marital status: Married    Spouse name: [name]   Number of children: 1   Years of education: 12   Highest education level: Not on file  Occupational History   Occupation: Programmer, 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[employer]    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  Tobacco Use   Smoking status: Never   Smokeless tobacco: Current    Types: Snuff   Tobacco comments:    Quit x 30 years  Vaping Use   Vaping Use: Never used  Substance and Sexual Activity   Alcohol use: Not Currently    Comment: occasionally on weekends   Drug use: No   Sexual activity: Yes    Birth control/protection: Surgical  Other Topics Concern   Not on file  Social History Narrative   Lives with wife   [place] farm with birds/chickens   Caffeine- diet Mtn Dew, 2 glasses   Social Determinants of Health   Financial Resource Strain: Not on file  Food Insecurity: Not on file  Transportation Needs: Not on file  Physical Activity: Not on file  Stress: Not on file  Social Connections: Not on file  Intimate Partner Violence: Not on file    FAMILY HISTORY: Family History  Problem Relation Age of Onset   Aneurysm Mother        brain   Hypertension Mother    Cancer Father    Heart disease Father    Diabetes Father    Hypertension Father     Hyperlipidemia Father    Cancer Maternal Grandmother        melanoma   Heart disease Paternal Grandfather    Colon cancer Neg Hx     ALLERGIES:  is allergic to duloxetine, gluten meal, and codeine.  MEDICATIONS:  Current Outpatient Medications  Medication Sig Dispense Refill   albuterol (VENTOLIN HFA) 108 (90 Base) MCG/ACT inhaler Inhale 1-2 puffs into the lungs every 6 (six) hours as needed for wheezing or shortness of breath.     Ascorbic Acid (VITAMIN C) 1000 MG tablet Take 1,000 mg by mouth daily.     aspirin 81 MG EC tablet Take 81 mg by mouth daily.     atorvastatin (LIPITOR) 40 MG tablet Take 1 tablet (40 mg total) by mouth daily. (Patient taking differently: Take 40 mg by mouth every evening.) 90 tablet 3   B Complex Vitamins (B COMPLEX PO) Take 1 tablet by mouth daily.     Cholecalciferol (DIALYVITE VITAMIN D 5000) 125 MCG (5000 UT) capsule Take 5,000 Units by mouth daily.     Digestive Enzymes (MULTI-ENZYME) TABS Take 1 tablet by mouth daily.     docusate sodium (COLACE) 100 MG capsule Take 200-300 mg by mouth daily as needed for mild constipation.     fenofibrate 160 MG tablet Take 160 mg by  mouth daily.     glipiZIDE (GLUCOTROL) 5 MG tablet Take 1 tablet (5 mg total) by mouth 2 (two) times daily before a meal. (Patient taking differently: Take 5 mg by mouth daily before breakfast.) 60 tablet 0   hydrochlorothiazide (HYDRODIURIL) 12.5 MG tablet Take 12.5 mg by mouth 2 (two) times daily.     ibuprofen (ADVIL) 200 MG tablet Take 600-800 mg by mouth every 8 (eight) hours as needed for moderate pain.     JARDIANCE 25 MG TABS tablet Take 25 mg by mouth every evening.     lisinopril (ZESTRIL) 10 MG tablet Take 10 mg by mouth at bedtime.     Multiple Vitamin (MULTIVITAMIN WITH MINERALS) TABS tablet Take 1 tablet by mouth daily.     ofloxacin (OCUFLOX) 0.3 % ophthalmic solution Place 1-2 drops into both eyes daily as needed (eye injections).     pantoprazole (PROTONIX) 40 MG tablet  Take 1 tablet (40 mg total) by mouth every evening. 30 tablet 6   Semaglutide, 1 MG/DOSE, (OZEMPIC, 1 MG/DOSE,) 2 MG/1.5ML SOPN Inject 2 mg into the skin every Friday.     AMBULATORY NON FORMULARY MEDICATION Medication Name: Prostaglandin 47mg/ml  Use as directed per MD 5 Syringe 11   pregabalin (LYRICA) 150 MG capsule Take 150 mg by mouth 2 (two) times daily.     No current facility-administered medications for this visit.      PHYSICAL EXAMINATION:   Vitals:   01/13/22 1309  BP: (!) 157/78  Pulse: 65  Temp: (!) 97.1 F (36.2 C)  SpO2: 100%   Filed Weights   01/13/22 1309  Weight: 190 lb 3.2 oz (86.3 kg)    Physical Exam Vitals and nursing note reviewed.  Constitutional:      Comments:    HENT:     Head: Normocephalic and atraumatic.     Mouth/Throat:     Pharynx: Oropharynx is clear.  Eyes:     Extraocular Movements: Extraocular movements intact.     Pupils: Pupils are equal, round, and reactive to light.  Cardiovascular:     Rate and Rhythm: Normal rate and regular rhythm.  Pulmonary:     Comments: Decreased breath sounds bilaterally.  Abdominal:     Palpations: Abdomen is soft.  Musculoskeletal:        General: Normal range of motion.     Cervical back: Normal range of motion.  Skin:    General: Skin is warm.  Neurological:     General: No focal deficit present.     Mental Status: He is alert and oriented to person, place, and time.  Psychiatric:        Behavior: Behavior normal.        Judgment: Judgment normal.    LABORATORY DATA:  I have reviewed the data as listed Lab Results  Component Value Date   WBC 4.6 01/13/2022   HGB 11.4 (L) 01/13/2022   HCT 35.6 (L) 01/13/2022   MCV 91.3 01/13/2022   PLT 214 01/13/2022   Recent Labs    05/28/21 1240 07/08/21 1444 10/14/21 1226 12/09/21 1545 01/13/22 1246  NA 139   < > 135 136 136  K 4.0   < > 3.8 3.7 3.9  CL 103   < > 105 109 108  CO2 28   < > 24 19* 23  GLUCOSE 153*   < > 327* 355* 339*   BUN 34*   < > 35* 37* 44*  CREATININE 1.84*   < >  2.16* 2.20* 2.62*  CALCIUM 9.8   < > 9.7 8.2*   8.5* 8.5*  GFRNONAA 42*   < > 34* 33* 27*  PROT 7.2  --   --   --   --   ALBUMIN 3.8  --   --  3.6  --   AST 26  --   --   --   --   ALT 27  --   --   --   --   ALKPHOS 85  --   --   --   --   BILITOT 0.6  --   --   --   --    < > = values in this interval not displayed.     No results found.  Symptomatic anemia #Anemia-symptomatic fatigue hemoglobin 9-10-unclear etiology; however suspect multifactorial-iron malabsorption/celiac disease/CKD-stage III.    # Current Hb- 11.4; proceed with IV venofer today.  Discussed that he will need likely maintenance IV iron every few months.  # Ckd stage III [GFR]-diabetes versus other causes. PCP/nephrology evaluation CT scan 2021 negative for any hydronephrosis [[place]].  # Fatigue: unlikely from anemia; ? Gabapentin; defer to PCP/ Endo  # Diabtes [[facility] Hb A1c- 6.2]-PBG 335-continue follow-up with endocrinology/monitoring of blood sugars.  # ?  History of celiac disease-defer to [facility]  # DISPOSITION: # Venofer today # follow up in 3 months- MD; labs- cbc/bmp;iron studies;ferritin-;possible venofer -Dr.B      All questions were answered. The patient knows to call the clinic with any problems, questions or concerns.      Cammie Sickle, MD 01/13/2022 1:51 PM

## 2022-01-26 ENCOUNTER — Telehealth: Payer: Self-pay | Admitting: Gastroenterology

## 2022-01-26 NOTE — Telephone Encounter (Signed)
Patients spouse has questions about the pantoprazole. ?

## 2022-01-26 NOTE — Telephone Encounter (Signed)
Requesting call back in reference to prescription at home. Pantaprozole, home delivery. Number to call them [phone]. Insurance company is [facility] [facility]. ?

## 2022-01-27 ENCOUNTER — Encounter: Payer: Self-pay | Admitting: Gastroenterology

## 2022-01-27 MED ORDER — PANTOPRAZOLE SODIUM 40 MG PO TBEC: 40.0000 mg | DELAYED_RELEASE_TABLET | Freq: Every evening | ORAL | 1 refills | Status: DC

## 2022-01-27 NOTE — Telephone Encounter (Signed)
I spoke to pt's spouse, ok per DPR... I explained to her that the only msg I have is from 3/14-3/15... She states she called last week but there is no record of a call... Also, I have not received any fax with pt's name requesting Rx be sent to [facility]. Mrs. Scalia confirmed the change of pharmacy [facility] and I let her know that I will be sending refill of Pantoprazole to mail order. ? ?Rx sent through e-scribe ? ?

## 2022-01-27 NOTE — Addendum Note (Signed)
Addended by: Lurlean Nanny on: 01/27/2022 04:15 PM ? ? Modules accepted: Orders ? ?

## 2022-01-27 NOTE — Telephone Encounter (Signed)
Pt spouse called with questions about  ?Pantopazole also has a question about requesting prescpriptions through My Chart ?

## 2022-03-03 NOTE — Telephone Encounter (Signed)
Patient called back after using 53mg/ml injection with no results. Reports he used three 384m/ml vials in a hours time with no results. ? ?Patient appointment scheduled with MD for prostaglandin re- teaching. Patient unsure if injecting correctly and will come in 2 weeks and bring own medication to inject with MD ?

## 2022-03-05 ENCOUNTER — Encounter: Payer: Self-pay | Admitting: Internal Medicine

## 2022-03-16 ENCOUNTER — Ambulatory Visit (INDEPENDENT_AMBULATORY_CARE_PROVIDER_SITE_OTHER): Payer: BC Managed Care – PPO | Admitting: Urology

## 2022-03-16 ENCOUNTER — Other Ambulatory Visit: Payer: Self-pay

## 2022-03-16 ENCOUNTER — Encounter: Payer: Self-pay | Admitting: Urology

## 2022-03-16 VITALS — BP 170/84 | HR 74

## 2022-03-16 DIAGNOSIS — N35010 Post-traumatic urethral stricture, male, meatal: Secondary | ICD-10-CM | POA: Diagnosis not present

## 2022-03-16 DIAGNOSIS — N5201 Erectile dysfunction due to arterial insufficiency: Secondary | ICD-10-CM

## 2022-03-16 DIAGNOSIS — N486 Induration penis plastica: Secondary | ICD-10-CM

## 2022-03-16 MED ORDER — AMBULATORY NON FORMULARY MEDICATION: 5 refills | Status: DC

## 2022-03-16 NOTE — Progress Notes (Signed)
History of Present Illness: 61 year old male here today to follow-up distal urethral stricture as well as ED/Peyronie's disease. ? ?He has been having unsuccessful results with injecting 30 mcg of alprostadil.  He comes today for me to see his injection technique and the results. ? ?Past Medical History:  ?Diagnosis Date  ? Adult celiac disease   ? Anemia   ? BPH (benign prostatic hyperplasia)   ? DM (diabetes mellitus) ([place])   ? Type II  ? GERD (gastroesophageal reflux disease)   ? HTN (hypertension)   ? Hyperlipidemia   ? Thoracic spondylosis without myelopathy 11/22/2016  ? ? ?Past Surgical History:  ?Procedure Laterality Date  ? BIOPSY N/A 09/07/2021  ? Procedure: BIOPSY;  Surgeon: Lucilla Lame, MD;  Location: [place];  Service: Endoscopy;  Laterality: N/A;  ? COLONOSCOPY  2003  ? Dr. Laural Golden: two small polyps, small ext hemorrhoids.path:focal adenomatous changes  ? COLONOSCOPY N/A 05/03/2014  ? Procedure: COLONOSCOPY;  Surgeon: Danie Binder, MD;  [facility];  Service: Endoscopy;  Laterality: N/A;  12:00  ? COLONOSCOPY WITH PROPOFOL N/A 09/07/2021  ? Procedure: COLONOSCOPY WITH PROPOFOL;  Surgeon: Lucilla Lame, MD;  Location: [place];  Service: Endoscopy;  Laterality: N/A;  ? ESOPHAGOGASTRODUODENOSCOPY N/A 05/03/2014  ? Procedure: ESOPHAGOGASTRODUODENOSCOPY (EGD);  Surgeon: Danie Binder, MD;  [facility];  Service: Endoscopy;  Laterality: N/A;  ? ESOPHAGOGASTRODUODENOSCOPY (EGD) WITH PROPOFOL N/A 06/03/2020  ? Procedure: ESOPHAGOGASTRODUODENOSCOPY (EGD) WITH PROPOFOL;  Surgeon: [name], MD;  Location: [facility];  Service: Endoscopy;  Laterality: N/A;  ? ESOPHAGOGASTRODUODENOSCOPY (EGD) WITH PROPOFOL N/A 09/07/2021  ? Procedure: ESOPHAGOGASTRODUODENOSCOPY (EGD) WITH PROPOFOL;  Surgeon: Lucilla Lame, MD;  Location: [place];  Service: Endoscopy;  Laterality: N/A;  Diabetic  ? PARS PLANA VITRECTOMY Right 03/21/2021  ? Procedure: PARS PLANA  VITRECTOMY 25 GAUGE WITH INJECTION OF ANTIBIOTICS FOR ENDOPHTHALMITIS;  Surgeon: Jalene Mullet, MD;  Location: [place];  Service: Ophthalmology;  Laterality: Right;  ? POLYPECTOMY N/A 09/07/2021  ? Procedure: POLYPECTOMY;  Surgeon: Lucilla Lame, MD;  Location: [place];  Service: Endoscopy;  Laterality: N/A;  ? ? ?Home Medications:  ?Allergies as of 03/16/2022   ? ?   Reactions  ? Duloxetine Other (See Comments)  ? "disoriented, confused, couldn't drive"  ? Gluten Meal   ? Celiac Disease  ? Codeine Rash  ? ?  ? ?  ?Medication List  ?  ? ?  ? Accurate as of Mar 16, 2022  8:44 AM. If you have any questions, ask your nurse or doctor.  ?  ?  ? ?  ? ?albuterol 108 (90 Base) MCG/ACT inhaler ?Commonly known as: VENTOLIN HFA ?Inhale 1-2 puffs into the lungs every 6 (six) hours as needed for wheezing or shortness of breath. ?  ?AMBULATORY NON FORMULARY MEDICATION ?Medication Name: Prostaglandin 58mg/ml  ?Use as directed per MD ?  ?aspirin 81 MG EC tablet ?Take 81 mg by mouth daily. ?  ?atorvastatin 40 MG tablet ?Commonly known as: LIPITOR ?Take 1 tablet (40 mg total) by mouth daily. ?What changed: when to take this ?  ?B COMPLEX PO ?Take 1 tablet by mouth daily. ?  ?Dialyvite Vitamin D 5000 125 MCG (5000 UT) capsule ?Generic drug: Cholecalciferol ?Take 5,000 Units by mouth daily. ?  ?docusate sodium 100 MG capsule ?Commonly known as: COLACE ?Take 200-300 mg by mouth daily as needed for mild constipation. ?  ?fenofibrate 160 MG tablet ?Take 160 mg by mouth daily. ?  ?  glipiZIDE 5 MG tablet ?Commonly known as: GLUCOTROL ?Take 1 tablet (5 mg total) by mouth 2 (two) times daily before a meal. ?What changed: when to take this ?  ?hydrochlorothiazide 12.5 MG tablet ?Commonly known as: HYDRODIURIL ?Take 12.5 mg by mouth 2 (two) times daily. ?  ?ibuprofen 200 MG tablet ?Commonly known as: ADVIL ?Take 600-800 mg by mouth every 8 (eight) hours as needed for moderate pain. ?  ?Jardiance 25 MG Tabs tablet ?Generic drug:  empagliflozin ?Take 25 mg by mouth every evening. ?  ?lisinopril 10 MG tablet ?Commonly known as: ZESTRIL ?Take 10 mg by mouth at bedtime. ?  ?Multi-Enzyme Tabs ?Take 1 tablet by mouth daily. ?  ?multivitamin with minerals Tabs tablet ?Take 1 tablet by mouth daily. ?  ?ofloxacin 0.3 % ophthalmic solution ?Commonly known as: OCUFLOX ?Place 1-2 drops into both eyes daily as needed (eye injections). ?  ?Ozempic (1 MG/DOSE) 2 MG/1.5ML Sopn ?Generic drug: Semaglutide (1 MG/DOSE) ?Inject 2 mg into the skin every Friday. ?  ?pantoprazole 40 MG tablet ?Commonly known as: PROTONIX ?Take 1 tablet (40 mg total) by mouth every evening. ?  ?pregabalin 150 MG capsule ?Commonly known as: LYRICA ?Take 150 mg by mouth 2 (two) times daily. ?  ?vitamin C 1000 MG tablet ?Take 1,000 mg by mouth daily. ?  ? ?  ? ? ?Allergies:  ?Allergies  ?Allergen Reactions  ? Duloxetine Other (See Comments)  ?  "disoriented, confused, couldn't drive"  ? Gluten Meal   ?  Celiac Disease  ? Codeine Rash  ? ? ?Family History  ?Problem Relation Age of Onset  ? Aneurysm Mother   ?     brain  ? Hypertension Mother   ? Cancer Father   ? Heart disease Father   ? Diabetes Father   ? Hypertension Father   ? Hyperlipidemia Father   ? Cancer Maternal Grandmother   ?     melanoma  ? Heart disease Paternal Grandfather   ? Colon cancer Neg Hx   ? ? ?Social History:  reports that he has never smoked. His smokeless tobacco use includes snuff. He reports that he does not currently use alcohol. He reports that he does not use drugs. ? ?ROS: ?A complete review of systems was performed.  All systems are negative except for pertinent findings as noted. ? ?Physical Exam:  ?Vital signs in last 24 hours: ?There were no vitals taken for this visit. ?Constitutional:  Alert and oriented, No acute distress ?Cardiovascular: Regular rate  ?Respiratory: Normal respiratory effort ?Neurologic: Grossly intact, no focal deficits ?Psychiatric: Normal mood and affect ? ?I have reviewed  prior pt notes ? ?I have reviewed notes from referring/previous physicians ? ?I have reviewed urinalysis results ? ?I watched him inject 30 mcg of alprostadil in his right corporal body.  He used proper technique.  He had perhaps a 25% erection. ? ? ?Impression/Assessment:  ?ED, failing treatment so far.  He is a diabetic ? ?Plan:  ?1.  I discussed starting him on a Trimix versus surgical implant He did prefer to stick with injections for now. ? ?2.  We will send a prescription in for Trimix-PAP/PHE/PGE 30/4/40-inject 0.3 to 0.5 mL as needed ? ?3.  I will have him come back for recheck in 3 months ? ?

## 2022-03-24 ENCOUNTER — Ambulatory Visit
Admission: EM | Admit: 2022-03-24 | Discharge: 2022-03-24 | Disposition: A | Discharge status: Home / Self Care | Payer: BC Managed Care – PPO | Attending: Family Medicine | Admitting: Family Medicine

## 2022-03-24 ENCOUNTER — Ambulatory Visit (INDEPENDENT_AMBULATORY_CARE_PROVIDER_SITE_OTHER): Payer: BC Managed Care – PPO

## 2022-03-24 DIAGNOSIS — M25531 Pain in right wrist: Secondary | ICD-10-CM

## 2022-03-24 MED ORDER — DEXAMETHASONE SODIUM PHOSPHATE 10 MG/ML IJ SOLN
10.0000 mg | Freq: Once | INTRAMUSCULAR | Status: AC
  Administered 2022-03-24: 10 mg via INTRAMUSCULAR

## 2022-03-24 NOTE — ED Triage Notes (Signed)
Pt states that over the weekend a knot came up on his right wrist ? ?Pt states he tried ice and his regular meds because he has Asprin and pain relievers already prescribed ? ?Pt cant remember if he hit his wrist on anything to cause injury ?

## 2022-03-27 NOTE — ED Provider Notes (Signed)
?[facility] ? ? ? ?CSN: [id number] ?Arrival date & time: 03/24/22  1537 ? ? ?  ? ?History   ?Chief Complaint ?Chief Complaint  ?Patient presents with  ? Wrist Pain  ?  A knot has come up on right wrist  ? ? ?HPI ?Brian Nielsen is a 61 y.o. male.  ? ?Presenting today with a painful bump that came up on his dorsal right wrist the past few days.  He does not recall any injury to the area.  He denies numbness, tingling, weakness, fever, chills, discoloration.  Has tried ice and aspirin with no relief. ? ? ?Past Medical History:  ?Diagnosis Date  ? Adult celiac disease   ? Anemia   ? BPH (benign prostatic hyperplasia)   ? DM (diabetes mellitus) ([place])   ? Type II  ? GERD (gastroesophageal reflux disease)   ? HTN (hypertension)   ? Hyperlipidemia   ? Thoracic spondylosis without myelopathy 11/22/2016  ? ? ?Patient Active Problem List  ? Diagnosis Date Noted  ? Loss of weight   ? Polyp of ascending colon   ? Gastritis without bleeding   ? Symptomatic anemia 05/28/2021  ? Thoracic spondylosis without myelopathy 11/22/2016  ? Vitamin D deficiency 11/22/2016  ? HLD (hyperlipidemia) 10/19/2016  ? BPH (benign prostatic hyperplasia) 10/19/2016  ? Essential hypertension 10/19/2016  ? Diabetes mellitus, insulin dependent (IDDM), controlled 10/19/2016  ? Celiac sprue 05/23/2014  ? Anemia, iron deficiency 04/19/2014  ? GERD (gastroesophageal reflux disease) 04/19/2014  ? ? ?Past Surgical History:  ?Procedure Laterality Date  ? BIOPSY N/A 09/07/2021  ? Procedure: BIOPSY;  Surgeon: Lucilla Lame, MD;  Location: [place];  Service: Endoscopy;  Laterality: N/A;  ? COLONOSCOPY  2003  ? Dr. Laural Golden: two small polyps, small ext hemorrhoids.path:focal adenomatous changes  ? COLONOSCOPY N/A 05/03/2014  ? Procedure: COLONOSCOPY;  Surgeon: Danie Binder, MD;  [facility];  Service: Endoscopy;  Laterality: N/A;  12:00  ? COLONOSCOPY WITH PROPOFOL N/A 09/07/2021  ? Procedure: COLONOSCOPY WITH PROPOFOL;  Surgeon:  Lucilla Lame, MD;  Location: [place];  Service: Endoscopy;  Laterality: N/A;  ? ESOPHAGOGASTRODUODENOSCOPY N/A 05/03/2014  ? Procedure: ESOPHAGOGASTRODUODENOSCOPY (EGD);  Surgeon: Danie Binder, MD;  [facility];  Service: Endoscopy;  Laterality: N/A;  ? ESOPHAGOGASTRODUODENOSCOPY (EGD) WITH PROPOFOL N/A 06/03/2020  ? Procedure: ESOPHAGOGASTRODUODENOSCOPY (EGD) WITH PROPOFOL;  Surgeon: [name], MD;  Location: [facility];  Service: Endoscopy;  Laterality: N/A;  ? ESOPHAGOGASTRODUODENOSCOPY (EGD) WITH PROPOFOL N/A 09/07/2021  ? Procedure: ESOPHAGOGASTRODUODENOSCOPY (EGD) WITH PROPOFOL;  Surgeon: Lucilla Lame, MD;  Location: [place];  Service: Endoscopy;  Laterality: N/A;  Diabetic  ? PARS PLANA VITRECTOMY Right 03/21/2021  ? Procedure: PARS PLANA VITRECTOMY 25 GAUGE WITH INJECTION OF ANTIBIOTICS FOR ENDOPHTHALMITIS;  Surgeon: Jalene Mullet, MD;  Location: [place];  Service: Ophthalmology;  Laterality: Right;  ? POLYPECTOMY N/A 09/07/2021  ? Procedure: POLYPECTOMY;  Surgeon: Lucilla Lame, MD;  Location: [place];  Service: Endoscopy;  Laterality: N/A;  ? ? ? ? ? ?Home Medications   ? ?Prior to Admission medications   ?Medication Sig Start Date End Date Taking? Authorizing Provider  ?albuterol (VENTOLIN HFA) 108 (90 Base) MCG/ACT inhaler Inhale 1-2 puffs into the lungs every 6 (six) hours as needed for wheezing or shortness of breath.    [provider]  ?AMBULATORY NON FORMULARY MEDICATION Medication Name: Prostaglandin 38mg/ml  ?Use as directed per MD 12/18/21   DFranchot Gallo MD  ?  AMBULATORY NON FORMULARY MEDICATION Medication Name: Trimix--30/4/40 (PAP/PHE/PGE)  ? ?Inject 0.3-0.5 mL intracorporal as needed, not more than 3 times a week  ? ?Ten 1 mL vials, 5 refills 03/16/22   Franchot Gallo, MD  ?Ascorbic Acid (VITAMIN C) 1000 MG tablet Take 1,000 mg by mouth daily.    [provider]  ?aspirin 81 MG EC tablet Take 81 mg by mouth  daily.    [provider]  ?atorvastatin (LIPITOR) 40 MG tablet Take 1 tablet (40 mg total) by mouth daily. ?Patient taking differently: Take 40 mg by mouth every evening. 10/21/16   Raylene Everts, MD  ?B Complex Vitamins (B COMPLEX PO) Take 1 tablet by mouth daily.    [provider]  ?Cholecalciferol (DIALYVITE VITAMIN D 5000) 125 MCG (5000 UT) capsule Take 5,000 Units by mouth daily.    [provider]  ?Digestive Enzymes (MULTI-ENZYME) TABS Take 1 tablet by mouth daily.    [provider]  ?docusate sodium (COLACE) 100 MG capsule Take 200-300 mg by mouth daily as needed for mild constipation.    [provider]  ?fenofibrate 160 MG tablet Take 160 mg by mouth daily.    [provider]  ?gabapentin (NEURONTIN) 300 MG capsule Take by mouth. 02/16/22   [provider]  ?glipiZIDE (GLUCOTROL) 5 MG tablet Take 1 tablet (5 mg total) by mouth 2 (two) times daily before a meal. ?Patient taking differently: Take 5 mg by mouth daily before breakfast. 01/12/17   Raylene Everts, MD  ?hydrochlorothiazide (HYDRODIURIL) 12.5 MG tablet Take 12.5 mg by mouth 2 (two) times daily. 05/19/21   [provider]  ?ibuprofen (ADVIL) 200 MG tablet Take 600-800 mg by mouth every 8 (eight) hours as needed for moderate pain.    [provider]  ?JARDIANCE 25 MG TABS tablet Take 25 mg by mouth every evening. 09/08/19   [provider]  ?LANTUS SOLOSTAR 100 UNIT/ML Solostar Pen SMARTSIG:0-50 Unit(s) SUB-Q Daily 02/15/22   [provider]  ?lisinopril (ZESTRIL) 10 MG tablet Take 10 mg by mouth at bedtime. 09/29/21 09/29/22  [provider]  ?Multiple Vitamin (MULTIVITAMIN WITH MINERALS) TABS tablet Take 1 tablet by mouth daily.    [provider]  ?ofloxacin (OCUFLOX) 0.3 % ophthalmic solution Place 1-2 drops into both eyes daily as needed (eye injections). 05/22/21   [provider]  ?pantoprazole (PROTONIX) 40 MG  tablet Take 1 tablet (40 mg total) by mouth every evening. 01/27/22   Lucilla Lame, MD  ?pregabalin (LYRICA) 150 MG capsule Take 150 mg by mouth 2 (two) times daily.    [provider]  ?Semaglutide, 1 MG/DOSE, (OZEMPIC, 1 MG/DOSE,) 2 MG/1.5ML SOPN Inject 2 mg into the skin every Friday.    [provider]  ? ? ?Family History ?Family History  ?Problem Relation Age of Onset  ? Aneurysm Mother   ?     brain  ? Hypertension Mother   ? Cancer Father   ? Heart disease Father   ? Diabetes Father   ? Hypertension Father   ? Hyperlipidemia Father   ? Cancer Maternal Grandmother   ?     melanoma  ? Heart disease Paternal Grandfather   ? Colon cancer Neg Hx   ? ? ?Social History ?Social History  ? ?Tobacco Use  ? Smoking status: Never  ? Smokeless tobacco: Current  ?  Types: Chew  ? Tobacco comments:  ?  Quit x 30 years  ?Vaping Use  ?  Vaping Use: Never used  ?Substance Use Topics  ? Alcohol use: Not Currently  ?  Comment: occasionally on weekends  ? Drug use: No  ? ? ? ?Allergies   ?Duloxetine, Gluten meal, and Codeine ? ? ?Review of Systems ?Review of Systems ?Per HPI ? ?Physical Exam ?Triage Vital Signs ?ED Triage Vitals  ?Enc Vitals Group  ?   BP 03/24/22 1548 138/72  ?   Pulse Rate 03/24/22 1548 70  ?   Resp 03/24/22 1548 20  ?   Temp 03/24/22 1548 97.9 ?F (36.6 ?C)  ?   Temp Source 03/24/22 1548 Oral  ?   SpO2 03/24/22 1548 96 %  ?   Weight --   ?   Height --   ?   Head Circumference --   ?   Peak Flow --   ?   Pain Score 03/24/22 1550 9  ?   Pain Loc --   ?   Pain Edu? --   ?   Excl. in [place]? --   ? ?No data found. ? ?Updated Vital Signs ?BP 138/72 (BP Location: Right Arm)   Pulse 70   Temp 97.9 ?F (36.6 ?C) (Oral)   Resp 20   SpO2 96%  ? ?Visual Acuity ?Right Eye Distance:   ?Left Eye Distance:   ?Bilateral Distance:   ? ?Right Eye Near:   ?Left Eye Near:    ?Bilateral Near:    ? ?Physical Exam ?Vitals and nursing note reviewed.  ?Constitutional:   ?   Appearance: Normal appearance.  ?HENT:  ?    Head: Atraumatic.  ?Eyes:  ?   Extraocular Movements: Extraocular movements intact.  ?   Conjunctiva/sclera: Conjunctivae normal.  ?Cardiovascular:  ?   Rate and Rhythm: Normal rate and regular rhythm.  ?Pulmonary:  ?

## 2022-03-31 ENCOUNTER — Encounter: Payer: Self-pay | Admitting: Internal Medicine

## 2022-03-31 ENCOUNTER — Ambulatory Visit (INDEPENDENT_AMBULATORY_CARE_PROVIDER_SITE_OTHER): Payer: BC Managed Care – PPO | Admitting: Orthopedic Surgery

## 2022-03-31 ENCOUNTER — Encounter: Payer: Self-pay | Admitting: Orthopedic Surgery

## 2022-03-31 VITALS — Ht 68.0 in | Wt 196.0 lb

## 2022-03-31 DIAGNOSIS — M71332 Other bursal cyst, left wrist: Secondary | ICD-10-CM

## 2022-03-31 MED ORDER — PREDNISONE 10 MG (21) PO TBPK: ORAL_TABLET | ORAL | 0 refills | Status: DC

## 2022-03-31 NOTE — Patient Instructions (Signed)

## 2022-04-02 ENCOUNTER — Encounter: Payer: Self-pay | Admitting: Orthopedic Surgery

## 2022-04-02 NOTE — Progress Notes (Signed)
New Patient Visit  Assessment: Brian Nielsen is a 61 y.o. male with the following: 1. Synovial cyst of left wrist  Plan: Brian Nielsen has a small cyst that has developed directly overlying the ulnar styloid.  In addition, there is swelling in this area.  He has exquisite tenderness to palpation within the TFCC.  On radiographs, he has an enlarged ulnar styloid.  We discussed multiple treatment options.  He would like to proceed with attempted aspiration of the cyst, as well as a TFCC injection.  Follow-up as needed.  Procedure note injection - Right TFCC   Verbal consent was obtained to inject the right wrist, TFCC Timeout was completed to confirm the site of injection.   The skin was prepped with alcohol and ethyl chloride was sprayed at the injection site.  A 21-gauge needle was used to inject 40 mg of Depo-Medrol and 1% lidocaine (1 cc) into the TFCC of the right wrist using a direct lateral approach.  There were no complications.  A sterile bandage was applied.    Procedure note - dorsal wrist cyst aspiration  Verbal consent was obtained to aspirate a cyst on the dorsal aspect of the right wrist Timeout was completed to confirm the site of aspiration Skin was prepped with alcohol and ethyl chloride was sprayed at the aspiration site An 18-gauge needle was introduced into the cyst. Despite multiple attempts, we are unable to aspirate any fluid from this nodule. No complications Sterile bandage was applied  Follow-up: Return if symptoms worsen or fail to improve.  Subjective:  Chief Complaint  Patient presents with   Wrist Pain    Nodule on right wrist for approx 2 weeks causing pain.     History of Present Illness: Brian Nielsen is a 61 y.o. male who presents for evaluation of right wrist pain.  A couple of weeks ago, he noticed a small nodule on the dorsum of the right wrist.  No specific injury.  This is been tender to palpation.  He notes worsening pain with some motions.   No overlying skin changes.  No concern for infection.  His health is remained stable   Review of Systems: No fevers or chills No numbness or tingling No chest pain No shortness of breath No bowel or bladder dysfunction No GI distress No headaches   Medical History:  Past Medical History:  Diagnosis Date   Adult celiac disease    Anemia    BPH (benign prostatic hyperplasia)    DM (diabetes mellitus) ([facility])    Type II   GERD (gastroesophageal reflux disease)    HTN (hypertension)    Hyperlipidemia    Thoracic spondylosis without myelopathy 11/22/2016    Past Surgical History:  Procedure Laterality Date   BIOPSY N/A 09/07/2021   Procedure: BIOPSY;  Surgeon: Lucilla Lame, MD;  Location: [place];  Service: Endoscopy;  Laterality: N/A;   COLONOSCOPY  2003   Dr. Laural Golden: two small polyps, small ext hemorrhoids.path:focal adenomatous changes   COLONOSCOPY N/A 05/03/2014   Procedure: COLONOSCOPY;  Surgeon: Danie Binder, MD;  [facility];  Service: Endoscopy;  Laterality: N/A;  12:00   COLONOSCOPY WITH PROPOFOL N/A 09/07/2021   Procedure: COLONOSCOPY WITH PROPOFOL;  Surgeon: Lucilla Lame, MD;  Location: [place];  Service: Endoscopy;  Laterality: N/A;   ESOPHAGOGASTRODUODENOSCOPY N/A 05/03/2014   Procedure: ESOPHAGOGASTRODUODENOSCOPY (EGD);  Surgeon: Danie Binder, MD;  [facility];  Service: Endoscopy;  Laterality: N/A;  ESOPHAGOGASTRODUODENOSCOPY (EGD) WITH PROPOFOL N/A 06/03/2020   Procedure: ESOPHAGOGASTRODUODENOSCOPY (EGD) WITH PROPOFOL;  Surgeon: [name], MD;  Location: [facility];  Service: Endoscopy;  Laterality: N/A;   ESOPHAGOGASTRODUODENOSCOPY (EGD) WITH PROPOFOL N/A 09/07/2021   Procedure: ESOPHAGOGASTRODUODENOSCOPY (EGD) WITH PROPOFOL;  Surgeon: Lucilla Lame, MD;  Location: [place];  Service: Endoscopy;  Laterality: N/A;  Diabetic   PARS PLANA VITRECTOMY Right 03/21/2021   Procedure: PARS PLANA  VITRECTOMY 25 GAUGE WITH INJECTION OF ANTIBIOTICS FOR ENDOPHTHALMITIS;  Surgeon: Jalene Mullet, MD;  Location: [place];  Service: Ophthalmology;  Laterality: Right;   POLYPECTOMY N/A 09/07/2021   Procedure: POLYPECTOMY;  Surgeon: Lucilla Lame, MD;  Location: [place];  Service: Endoscopy;  Laterality: N/A;    Family History  Problem Relation Age of Onset   Aneurysm Mother        brain   Hypertension Mother    Cancer Father    Heart disease Father    Diabetes Father    Hypertension Father    Hyperlipidemia Father    Cancer Maternal Grandmother        melanoma   Heart disease Paternal Grandfather    Colon cancer Neg Hx    Social History   Tobacco Use   Smoking status: Never   Smokeless tobacco: Current    Types: Chew   Tobacco comments:    Quit x 30 years  Vaping Use   Vaping Use: Never used  Substance Use Topics   Alcohol use: Not Currently    Comment: occasionally on weekends   Drug use: No    Allergies  Allergen Reactions   Duloxetine Other (See Comments)    "disoriented, confused, couldn't drive"   Gluten Meal     Celiac Disease   Codeine Rash    Current Meds  Medication Sig   predniSONE (STERAPRED UNI-PAK 21 TAB) 10 MG (21) TBPK tablet 10 mg DS 12 as directed    Objective: Ht 5' 8"  (1.727 m)   Wt 196 lb (88.9 kg)   BMI 29.80 kg/m   Physical Exam:  General: Alert and oriented. and No acute distress. Gait: Normal gait.  Wrist with obvious redness and swelling at the dorsal aspect of the ulnar styloid.  There is a small, approximately 5 mm in diameter nodule.  This is firm but rubbery.  There is some motion.  There is tenderness to palpation of this area.  He also is tenderness to palpation within the TFCC.  Pain with ulnar deviation.  Fingers are warm and well-perfused.  IMAGING: I personally reviewed images previously obtained from the ED   Right wrist x-ray with no acute injury.  Ulnar styloid is larger than normal  New Medications:   Meds ordered this encounter  Medications   predniSONE (STERAPRED UNI-PAK 21 TAB) 10 MG (21) TBPK tablet    Sig: 10 mg DS 12 as directed    Dispense:  48 tablet    Refill:  0      Mordecai Rasmussen, MD  04/02/2022 2:26 PM

## 2022-04-05 ENCOUNTER — Telehealth: Payer: Self-pay | Admitting: Radiology

## 2022-04-05 NOTE — Telephone Encounter (Addendum)
Wife called, patient woke up Saturday 5/20 and felt like he was having trouble breathing.  He has d/c the prednisone.  Still having swelling and pain in hand, requests referral to hand surgeon.  I can facilitate appt with Dr Tempie Donning.  Any medication or other advice for patient in the interim?

## 2022-04-05 NOTE — Telephone Encounter (Signed)
I called advised and scheduled with Dr Tempie Donning.

## 2022-04-08 ENCOUNTER — Encounter: Payer: Self-pay | Admitting: Orthopedic Surgery

## 2022-04-08 ENCOUNTER — Ambulatory Visit: Payer: Self-pay

## 2022-04-08 ENCOUNTER — Ambulatory Visit (INDEPENDENT_AMBULATORY_CARE_PROVIDER_SITE_OTHER): Payer: BC Managed Care – PPO | Admitting: Orthopedic Surgery

## 2022-04-08 DIAGNOSIS — M25531 Pain in right wrist: Secondary | ICD-10-CM | POA: Diagnosis not present

## 2022-04-08 NOTE — Progress Notes (Signed)
Office Visit Note   Patient: Brian Nielsen           Date of Birth: 03-14-1961           MRN: [id number] Visit Date: 04/08/2022              Requested by: Dion Body, MD [facility] [place],  [place] [address] PCP: Dion Body, MD   Assessment & Plan: Visit Diagnoses:  1. Pain in right wrist     Plan: Patient has focal swelling at the dorsal ulnar aspect of the wrist over the ulnar head and ECU tendon.  He does have a small cyst-like mass.  Attempted aspiration was unsuccessful at the outside office.  I would like to get an MRI to further evaluate the area.  He does have signs and symptoms concerning for ECU pathology and focal swelling consistent with a cystic type mass.  I can see him back after the MRI to discuss next [facility] management.  Follow-Up Instructions: No follow-ups on file.   Orders:  Orders Placed This Encounter  Procedures   XR Wrist Complete Right   No orders of the defined types were placed in this encounter.     Procedures: No procedures performed   Clinical Data: No additional findings.   Subjective: Chief Complaint  Patient presents with   Right Wrist - New Patient (Initial Visit)    This is a 61 year old right-hand-dominant male who presents with swelling and pain at the ulnar aspect of the wrist.  This current episode been going on for approximately 4 weeks.  He notes that the mass at the ulnar aspect of the wrist has been getting bigger in that time.  He was seen at an outside office where an attempt was made to aspirate the area with no success.  He also underwent corticosteroid injection into the TFCC and ulnar aspect of the wrist which he does not think is providing symptom relief.  He was started on a prednisone taper at the same time but stopped after 2 days secondary to subjective shortness of breath.  He does note that he had ulnar-sided wrist pain prior to this that usually happens with prolonged  activity with working and improved with rest.  He denies any obvious injury to the wrist.   Review of Systems   Objective: Vital Signs: There were no vitals taken for this visit.  Physical Exam Constitutional:      Appearance: Normal appearance.  Cardiovascular:     Rate and Rhythm: Normal rate.     Pulses: Normal pulses.  Pulmonary:     Effort: Pulmonary effort is normal.  Skin:    General: Skin is warm and dry.     Capillary Refill: Capillary refill takes less than 2 seconds.  Neurological:     Mental Status: He is alert.    Right Hand Exam   Tenderness  Right hand tenderness location: TTP at dorsal ulnar aspect of wrist over ECU tendon and ulnar head.  Range of Motion  The patient has normal right wrist ROM.   Other  Erythema: absent Sensation: normal Pulse: present  Comments:  Focal swelling at dorsal/ulnar aspect of wrist.  Cystic appearing mass at dorsal ulnar wrist.  No foveal tenderness.  No DRUJ instability.  No pain w/ ECU synergy test but pain over ECU tendon w/ dart throwers or ice-cream scoop motion.       Specialty Comments:  No specialty comments available.  Imaging:  No results found.   PMFS History: Patient Active Problem List   Diagnosis Date Noted   Loss of weight    Polyp of ascending colon    Gastritis without bleeding    Symptomatic anemia 05/28/2021   Thoracic spondylosis without myelopathy 11/22/2016   Vitamin D deficiency 11/22/2016   HLD (hyperlipidemia) 10/19/2016   BPH (benign prostatic hyperplasia) 10/19/2016   Essential hypertension 10/19/2016   Diabetes mellitus, insulin dependent (IDDM), controlled 10/19/2016   Celiac sprue 05/23/2014   Anemia, iron deficiency 04/19/2014   GERD (gastroesophageal reflux disease) 04/19/2014   Past Medical History:  Diagnosis Date   Adult celiac disease    Anemia    BPH (benign prostatic hyperplasia)    DM (diabetes mellitus) (HCC)    Type II   GERD (gastroesophageal reflux disease)     HTN (hypertension)    Hyperlipidemia    Thoracic spondylosis without myelopathy 11/22/2016    Family History  Problem Relation Age of Onset   Aneurysm Mother        brain   Hypertension Mother    Cancer Father    Heart disease Father    Diabetes Father    Hypertension Father    Hyperlipidemia Father    Cancer Maternal Grandmother        melanoma   Heart disease Paternal Grandfather    Colon cancer Neg Hx     Past Surgical History:  Procedure Laterality Date   BIOPSY N/A 09/07/2021   Procedure: BIOPSY;  Surgeon: Lucilla Lame, MD;  Location: [facility];  Service: Endoscopy;  Laterality: N/A;   COLONOSCOPY  2003   Dr. Laural Golden: two small polyps, small ext hemorrhoids.path:focal adenomatous changes   COLONOSCOPY N/A 05/03/2014   Procedure: COLONOSCOPY;  Surgeon: Danie Binder, MD;  [facility];  Service: Endoscopy;  Laterality: N/A;  12:00   COLONOSCOPY WITH PROPOFOL N/A 09/07/2021   Procedure: COLONOSCOPY WITH PROPOFOL;  Surgeon: Lucilla Lame, MD;  Location: [facility];  Service: Endoscopy;  Laterality: N/A;   ESOPHAGOGASTRODUODENOSCOPY N/A 05/03/2014   Procedure: ESOPHAGOGASTRODUODENOSCOPY (EGD);  Surgeon: Danie Binder, MD;  [facility];  Service: Endoscopy;  Laterality: N/A;   ESOPHAGOGASTRODUODENOSCOPY (EGD) WITH PROPOFOL N/A 06/03/2020   Procedure: ESOPHAGOGASTRODUODENOSCOPY (EGD) WITH PROPOFOL;  Surgeon: [name], MD;  Location: [facility];  Service: Endoscopy;  Laterality: N/A;   ESOPHAGOGASTRODUODENOSCOPY (EGD) WITH PROPOFOL N/A 09/07/2021   Procedure: ESOPHAGOGASTRODUODENOSCOPY (EGD) WITH PROPOFOL;  Surgeon: Lucilla Lame, MD;  Location: [facility];  Service: Endoscopy;  Laterality: N/A;  Diabetic   PARS PLANA VITRECTOMY Right 03/21/2021   Procedure: PARS PLANA VITRECTOMY 25 GAUGE WITH INJECTION OF ANTIBIOTICS FOR ENDOPHTHALMITIS;  Surgeon: Jalene Mullet, MD;  Location: [facility];  Service: Ophthalmology;   Laterality: Right;   POLYPECTOMY N/A 09/07/2021   Procedure: POLYPECTOMY;  Surgeon: Lucilla Lame, MD;  Location: [facility];  Service: Endoscopy;  Laterality: N/A;   Social History   Occupational History   Occupation: Programmer, systems: [employer]    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

## 2022-04-14 ENCOUNTER — Telehealth: Payer: Self-pay | Admitting: *Deleted

## 2022-04-14 ENCOUNTER — Other Ambulatory Visit: Payer: Self-pay | Admitting: Internal Medicine

## 2022-04-14 NOTE — Telephone Encounter (Signed)
Call returned, patient is scheduled for wrist MRI on 6/14.  Patient's wife informed that he received Venofer and this does not affect MRI.

## 2022-04-14 NOTE — Telephone Encounter (Signed)
Wife called needing to speak with someone regarding the fact the patient needs to have an MRI and the fact that he has had iron infusion and the compatibility of them. Please advise

## 2022-04-16 ENCOUNTER — Inpatient Hospital Stay (HOSPITAL_BASED_OUTPATIENT_CLINIC_OR_DEPARTMENT_OTHER): Payer: BC Managed Care – PPO | Admitting: Internal Medicine

## 2022-04-16 ENCOUNTER — Inpatient Hospital Stay: Payer: BC Managed Care – PPO

## 2022-04-16 ENCOUNTER — Inpatient Hospital Stay: Payer: BC Managed Care – PPO | Attending: Internal Medicine

## 2022-04-16 ENCOUNTER — Encounter: Payer: Self-pay | Admitting: Internal Medicine

## 2022-04-16 DIAGNOSIS — D649 Anemia, unspecified: Secondary | ICD-10-CM

## 2022-04-16 DIAGNOSIS — M25531 Pain in right wrist: Secondary | ICD-10-CM

## 2022-04-16 LAB — CBC WITH DIFFERENTIAL/PLATELET
Abs Immature Granulocytes: 0.02 10*3/uL (ref 0.00–0.07)
Basophils Absolute: 0 10*3/uL (ref 0.0–0.1)
Basophils Relative: 1 %
Eosinophils Absolute: 0.2 10*3/uL (ref 0.0–0.5)
Eosinophils Relative: 4 %
HCT: 36 % — ABNORMAL LOW (ref 39.0–52.0)
Hemoglobin: 11.6 g/dL — ABNORMAL LOW (ref 13.0–17.0)
Immature Granulocytes: 0 %
Lymphocytes Relative: 14 %
Lymphs Abs: 0.7 10*3/uL (ref 0.7–4.0)
MCH: 29.1 pg (ref 26.0–34.0)
MCHC: 32.2 g/dL (ref 30.0–36.0)
MCV: 90.5 fL (ref 80.0–100.0)
Monocytes Absolute: 0.4 10*3/uL (ref 0.1–1.0)
Monocytes Relative: 8 %
Neutro Abs: 3.7 10*3/uL (ref 1.7–7.7)
Neutrophils Relative %: 73 %
Platelets: 199 10*3/uL (ref 150–400)
RBC: 3.98 MIL/uL — ABNORMAL LOW (ref 4.22–5.81)
RDW: 13.3 % (ref 11.5–15.5)
WBC: 5 10*3/uL (ref 4.0–10.5)
nRBC: 0 % (ref 0.0–0.2)

## 2022-04-16 LAB — BASIC METABOLIC PANEL
Anion gap: 9 (ref 5–15)
BUN: 28 mg/dL — ABNORMAL HIGH (ref 6–20)
CO2: 21 mmol/L — ABNORMAL LOW (ref 22–32)
Calcium: 8.3 mg/dL — ABNORMAL LOW (ref 8.9–10.3)
Chloride: 108 mmol/L (ref 98–111)
Creatinine, Ser: 1.85 mg/dL — ABNORMAL HIGH (ref 0.61–1.24)
GFR, Estimated: 41 mL/min — ABNORMAL LOW (ref >60)
Glucose, Bld: 152 mg/dL — ABNORMAL HIGH (ref 70–99)
Potassium: 3.7 mmol/L (ref 3.5–5.1)
Sodium: 138 mmol/L (ref 135–145)

## 2022-04-16 LAB — IRON AND TIBC
Iron: 83 ug/dL (ref 45–182)
Saturation Ratios: 20 % (ref 17.9–39.5)
TIBC: 417 ug/dL (ref 250–450)
UIBC: 334 ug/dL

## 2022-04-16 LAB — FERRITIN: Ferritin: 228 ng/mL (ref 24–336)

## 2022-04-16 MED ORDER — SODIUM CHLORIDE 0.9 % IV SOLN: 200.0000 mg | Freq: Once | INTRAVENOUS | Status: DC

## 2022-04-16 MED ORDER — SODIUM CHLORIDE 0.9 % IV SOLN
Freq: Once | INTRAVENOUS | Status: AC
  Filled 2022-04-16: qty 250

## 2022-04-16 MED ORDER — IRON SUCROSE 20 MG/ML IV SOLN
200.0000 mg | Freq: Once | INTRAVENOUS | Status: AC
  Administered 2022-04-16: 200 mg via INTRAVENOUS
  Filled 2022-04-16: qty 10

## 2022-04-16 NOTE — Patient Instructions (Signed)
[facility]CER CTR AT [place]  Discharge Instructions: Thank you for choosing [facility] to provide your oncology and hematology care.  If you have a lab appointment with the [facility], please go directly to the [facility] and check in at the registration area.  Wear comfortable clothing and clothing appropriate for easy access to any Portacath or PICC line.   We strive to give you quality time with your provider. You may need to reschedule your appointment if you arrive late (15 or more minutes).  Arriving late affects you and other patients whose appointments are after yours.  Also, if you miss three or more appointments without notifying the office, you may be dismissed from the clinic at the provider's discretion.      For prescription refill requests, have your pharmacy contact our office and allow 72 hours for refills to be completed.    Today you received the following chemotherapy and/or immunotherapy agents Venofer.      To help prevent nausea and vomiting after your treatment, we encourage you to take your nausea medication as directed.  BELOW ARE SYMPTOMS THAT SHOULD BE REPORTED IMMEDIATELY: *FEVER GREATER THAN 100.4 F (38 C) OR HIGHER *CHILLS OR SWEATING *NAUSEA AND VOMITING THAT IS NOT CONTROLLED WITH YOUR NAUSEA MEDICATION *UNUSUAL SHORTNESS OF BREATH *UNUSUAL BRUISING OR BLEEDING *URINARY PROBLEMS (pain or burning when urinating, or frequent urination) *BOWEL PROBLEMS (unusual diarrhea, constipation, pain near the anus) TENDERNESS IN MOUTH AND THROAT WITH OR WITHOUT PRESENCE OF ULCERS (sore throat, sores in mouth, or a toothache) UNUSUAL RASH, SWELLING OR PAIN  UNUSUAL VAGINAL DISCHARGE OR ITCHING   Items with * indicate a potential emergency and should be followed up as soon as possible or go to the Emergency Department if any problems should occur.  Please show the CHEMOTHERAPY ALERT CARD or IMMUNOTHERAPY ALERT CARD at check-in to  the Emergency Department and triage nurse.  Should you have questions after your visit or need to cancel or reschedule your appointment, please contact [facility] CANCER [place] AT [place]  [phone] and follow the prompts.  Office hours are 8:00 a.m. to 4:30 p.m. Monday - Friday. Please note that voicemails left after 4:00 p.m. may not be returned until the following business day.  We are closed weekends and major holidays. You have access to a nurse at all times for urgent questions. Please call the main number to the clinic [phone] and follow the prompts.  For any non-urgent questions, you may also contact your provider using MyChart. We now offer e-Visits for anyone 71 and older to request care online for non-urgent symptoms. For details visit [email or web address].   Also download the MyChart app! Go to the app store, search "MyChart", open the app, select [facility], and log in with your MyChart username and password.  Due to Covid, a mask is required upon entering the hospital/clinic. If you do not have a mask, one will be given to you upon arrival. For doctor visits, patients may have 1 support person aged 34 or older with them. For treatment visits, patients cannot have anyone with them due to current Covid guidelines and our immunocompromised population.

## 2022-04-16 NOTE — Assessment & Plan Note (Addendum)
#  Anemia-symptomatic fatigue hemoglobin 9-10-unclear etiology; however suspect multifactorial-iron malabsorption/celiac disease/CKD-stage III.    # Current Hb- 11.6; proceed with IV venofer today.  Discussed that he will need likely maintenance IV iron every few months.  No interaction with MRI.  Fatigue likely due to secondary to underlying anemia.   # Ckd stage III [GFR]-diabetes- GFR-41.   # Diabtes [[facility] Hb A1c- 6.2]-PBG 1525-continue follow-up with endocrinology/monitoring of blood sugars.  # ?  History of celiac disease-defer to [facility]  # DISPOSITION: # Venofer today # follow up in 3 months- MD; labs- cbc/bmp;iron studies;ferritin-;possible venofer -Dr.B

## 2022-04-16 NOTE — Progress Notes (Signed)
[facility] CONSULT NOTE  Patient Care Team: Dion Body, MD as PCP - General (Family Medicine) [name], NP (Inactive) as Nurse Practitioner (Endocrinology) Cammie Sickle, MD as Consulting Physician (Oncology)  CHIEF COMPLAINTS/PURPOSE OF CONSULTATION: ANEMIA   HEMATOLOGY HISTORY:  # ANEMIA PROGRESSIVE since 2020 12; 2022- 10 MCV- 90s; colonoscopy 2015; multiple endoscopies-last September 2021-biopsy active celiac disease   #CKD stage III [[facility]]/diabetes [[facility]]; ? Celaic disease [[facility]];    - Labs: 07/2014 - tTG IgA 101 03/2020 - positive endomysial Ab IgA, tTG IgA 78 - CSY: 07/2014 - normal appearing terminal ileum, sigmoid diverticulosis, and non-bleeding internal hemorrhoids - EGD: 07/2014 - mild non-erosive gastritis, normal appearing duodenal mucosa with duodenal biopsies showing celiac sprue - EGD: 06/03/2020 - procedure aborted due to presence of food - EGD: 08/12/2020 - irregular Z-line found at GEJ with bx showing mild reflux esophagitis, 1 cm hiatal hernia, mild chronic gastritis, decreased folds found in entire duodenum, flattening found in entire duodenum and scalloped mucosa with bx showing active Celiac disease with villous atrophy, increased intraepithelial lymphocytes, etc. Repeat in 1-year.  HISTORY OF PRESENTING ILLNESS: Alone.  Ambulating independently.  Brian Nielsen 61 y.o.  male symptomatic anemia-is here for follow-up.  Patient continues to complains of chronic fatigue.  Chronic GI complaints.  No fever no chills. Otherwise no nausea no vomiting.  No fevers or chills.  He is currently awaiting evaluation with an MRI  of his right wrist.   Review of Systems  Constitutional:  Positive for malaise/fatigue and weight loss. Negative for chills, diaphoresis and fever.  HENT:  Negative for nosebleeds and sore throat.   Eyes:  Negative for double vision.  Respiratory:  Positive for shortness of breath.  Negative for cough, hemoptysis, sputum production and wheezing.   Cardiovascular:  Negative for chest pain, palpitations, orthopnea and leg swelling.  Gastrointestinal:  Negative for blood in stool, heartburn, melena, nausea and vomiting.  Genitourinary:  Negative for dysuria, frequency and urgency.  Musculoskeletal:  Positive for back pain and joint pain.  Skin: Negative.  Negative for itching and rash.  Neurological:  Positive for weakness. Negative for dizziness, tingling, focal weakness and headaches.  Endo/Heme/Allergies:  Does not bruise/bleed easily.  Psychiatric/Behavioral:  Negative for depression. The patient is not nervous/anxious and does not have insomnia.    MEDICAL HISTORY:  Past Medical History:  Diagnosis Date   Adult celiac disease    Anemia    BPH (benign prostatic hyperplasia)    DM (diabetes mellitus) (HCC)    Type II   GERD (gastroesophageal reflux disease)    HTN (hypertension)    Hyperlipidemia    Thoracic spondylosis without myelopathy 11/22/2016    SURGICAL HISTORY: Past Surgical History:  Procedure Laterality Date   BIOPSY N/A 09/07/2021   Procedure: BIOPSY;  Surgeon: Lucilla Lame, MD;  Location: [facility];  Service: Endoscopy;  Laterality: N/A;   COLONOSCOPY  2003   Dr. Laural Golden: two small polyps, small ext hemorrhoids.path:focal adenomatous changes   COLONOSCOPY N/A 05/03/2014   Procedure: COLONOSCOPY;  Surgeon: Danie Binder, MD;  [facility];  Service: Endoscopy;  Laterality: N/A;  12:00   COLONOSCOPY WITH PROPOFOL N/A 09/07/2021   Procedure: COLONOSCOPY WITH PROPOFOL;  Surgeon: Lucilla Lame, MD;  Location: [facility];  Service: Endoscopy;  Laterality: N/A;   ESOPHAGOGASTRODUODENOSCOPY N/A 05/03/2014   Procedure: ESOPHAGOGASTRODUODENOSCOPY (EGD);  Surgeon: Danie Binder, MD;  [facility];  Service: Endoscopy;  Laterality: N/A;  ESOPHAGOGASTRODUODENOSCOPY (EGD) WITH PROPOFOL N/A 06/03/2020   Procedure:  ESOPHAGOGASTRODUODENOSCOPY (EGD) WITH PROPOFOL;  Surgeon: [name], MD;  Location: [facility];  Service: Endoscopy;  Laterality: N/A;   ESOPHAGOGASTRODUODENOSCOPY (EGD) WITH PROPOFOL N/A 09/07/2021   Procedure: ESOPHAGOGASTRODUODENOSCOPY (EGD) WITH PROPOFOL;  Surgeon: Lucilla Lame, MD;  Location: [facility];  Service: Endoscopy;  Laterality: N/A;  Diabetic   PARS PLANA VITRECTOMY Right 03/21/2021   Procedure: PARS PLANA VITRECTOMY 25 GAUGE WITH INJECTION OF ANTIBIOTICS FOR ENDOPHTHALMITIS;  Surgeon: Jalene Mullet, MD;  Location: [facility];  Service: Ophthalmology;  Laterality: Right;   POLYPECTOMY N/A 09/07/2021   Procedure: POLYPECTOMY;  Surgeon: Lucilla Lame, MD;  Location: [facility];  Service: Endoscopy;  Laterality: N/A;    SOCIAL HISTORY: Social History   Socioeconomic History   Marital status: Married    Spouse name: [name]   Number of children: 1   Years of education: 12   Highest education level: Not on file  Occupational History   Occupation: Programmer, 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[employer]    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  Other Topics Concern   Not on file  Social History Narrative   Lives with wife   [place]/ch[place]   Caffeine- diet Mtn Dew, 2 glasses   Social Determinants of Health   Financial Resource Strain: Not on file  Food Insecurity: Not on file  Transportation Needs: Not on file  Physical Activity: Not on file  Stress: Not on file  Social Connections: Not on file  Intimate Partner Violence: Not on file    FAMILY HISTORY: Family History  Problem Relation Age of Onset    Aneurysm Mother        brain   Hypertension Mother    Cancer Father    Heart disease Father    Diabetes Father    Hypertension Father    Hyperlipidemia Father    Cancer Maternal Grandmother        melanoma   Heart disease Paternal Grandfather    Colon cancer Neg Hx     ALLERGIES:  is allergic to duloxetine, gluten meal, and codeine.  MEDICATIONS:  Current Outpatient Medications  Medication Sig Dispense Refill   AMBULATORY NON FORMULARY MEDICATION Medication Name: Trimix--30/4/40 (PAP/PHE/PGE)   Inject 0.3-0.5 mL intracorporal as needed, not more than 3 times a week   Ten 1 mL vials, 5 refills 10 vial 5   Ascorbic Acid (VITAMIN C) 1000 MG tablet Take 1,000 mg by mouth daily.     aspirin 81 MG EC tablet Take 81 mg by mouth daily.     atorvastatin (LIPITOR) 40 MG tablet Take 1 tablet (40 mg total) by mouth daily. (Patient taking differently: Take 40 mg by mouth every evening.) 90 tablet 3   B Complex Vitamins (B COMPLEX PO) Take 1 tablet by mouth daily.     Cholecalciferol (DIALYVITE VITAMIN D 5000) 125 MCG (5000 UT) capsule Take 5,000 Units by mouth daily.     Digestive Enzymes (MULTI-ENZYME) TABS Take 1 tablet by mouth daily.     fenofibrate  160 MG tablet Take 160 mg by mouth daily.     gabapentin (NEURONTIN) 300 MG capsule Take by mouth.     glipiZIDE (GLUCOTROL) 5 MG tablet Take 1 tablet (5 mg total) by mouth 2 (two) times daily before a meal. (Patient taking differently: Take 5 mg by mouth daily before breakfast.) 60 tablet 0   hydrochlorothiazide (HYDRODIURIL) 12.5 MG tablet Take 12.5 mg by mouth 2 (two) times daily.     JARDIANCE 25 MG TABS tablet Take 25 mg by mouth every evening.     LANTUS SOLOSTAR 100 UNIT/ML Solostar Pen SMARTSIG:0-50 Unit(s) SUB-Q Daily     lisinopril (ZESTRIL) 10 MG tablet Take 10 mg by mouth at bedtime.     Multiple Vitamin (MULTIVITAMIN WITH MINERALS) TABS tablet Take 1 tablet by mouth daily.     ofloxacin (OCUFLOX) 0.3 % ophthalmic solution Place  1-2 drops into both eyes daily as needed (eye injections).     pantoprazole (PROTONIX) 40 MG tablet Take 1 tablet (40 mg total) by mouth every evening. 90 tablet 1   Semaglutide, 1 MG/DOSE, (OZEMPIC, 1 MG/DOSE,) 2 MG/1.5ML SOPN Inject 2 mg into the skin every Friday.     albuterol (VENTOLIN HFA) 108 (90 Base) MCG/ACT inhaler Inhale 1-2 puffs into the lungs every 6 (six) hours as needed for wheezing or shortness of breath. (Patient not taking: Reported on 04/16/2022)     docusate sodium (COLACE) 100 MG capsule Take 200-300 mg by mouth daily as needed for mild constipation. (Patient not taking: Reported on 04/16/2022)     No current facility-administered medications for this visit.      PHYSICAL EXAMINATION:   Vitals:   04/16/22 1320  BP: 132/85  Pulse: 65  Resp: 16  Temp: 97.8 F (36.6 C)  SpO2: 98%   Filed Weights   04/16/22 1320  Weight: 197 lb (89.4 kg)    Physical Exam Vitals and nursing note reviewed.  Constitutional:      Comments:    HENT:     Head: Normocephalic and atraumatic.     Mouth/Throat:     Pharynx: Oropharynx is clear.  Eyes:     Extraocular Movements: Extraocular movements intact.     Pupils: Pupils are equal, round, and reactive to light.  Cardiovascular:     Rate and Rhythm: Normal rate and regular rhythm.  Pulmonary:     Comments: Decreased breath sounds bilaterally.  Abdominal:     Palpations: Abdomen is soft.  Musculoskeletal:        General: Normal range of motion.     Cervical back: Normal range of motion.  Skin:    General: Skin is warm.  Neurological:     General: No focal deficit present.     Mental Status: He is alert and oriented to person, place, and time.  Psychiatric:        Behavior: Behavior normal.        Judgment: Judgment normal.    LABORATORY DATA:  I have reviewed the data as listed Lab Results  Component Value Date   WBC 5.0 04/16/2022   HGB 11.6 (L) 04/16/2022   HCT 36.0 (L) 04/16/2022   MCV 90.5 04/16/2022   PLT  199 04/16/2022   Recent Labs    05/28/21 1240 07/08/21 1444 12/09/21 1545 01/13/22 1246 04/16/22 1246  NA 139   < > 136 136 138  K 4.0   < > 3.7 3.9 3.7  CL 103   < > 109 108 108  CO2 28   < > 19* 23 21*  GLUCOSE 153*   < > 355* 339* 152*  BUN 34*   < > 37* 44* 28*  CREATININE 1.84*   < > 2.20* 2.62* 1.85*  CALCIUM 9.8   < > 8.2*  8.5* 8.5* 8.3*  GFRNONAA 42*   < > 33* 27* 41*  PROT 7.2  --   --   --   --   ALBUMIN 3.8  --  3.6  --   --   AST 26  --   --   --   --   ALT 27  --   --   --   --   ALKPHOS 85  --   --   --   --   BILITOT 0.6  --   --   --   --    < > = values in this interval not displayed.     DG Wrist Complete Right  Result Date: 03/24/2022 CLINICAL DATA:  Right wrist pain and swelling. EXAM: RIGHT WRIST - COMPLETE 3+ VIEW COMPARISON:  Right wrist x-rays dated February 28, 2004. FINDINGS: There is no evidence of fracture or dislocation. There is no evidence of arthropathy or other focal bone abnormality. Prominent ulnar styloid process again noted. Soft tissues are unremarkable. IMPRESSION: Negative. Electronically Signed   By: Titus Dubin M.D.   On: 03/24/2022 16:43    Symptomatic anemia #Anemia-symptomatic fatigue hemoglobin 9-10-unclear etiology; however suspect multifactorial-iron malabsorption/celiac disease/CKD-stage III.    # Current Hb- 11.6; proceed with IV venofer today.  Discussed that he will need likely maintenance IV iron every few months.  No interaction with MRI.  Fatigue likely due to secondary to underlying anemia.   # Ckd stage III [GFR]-diabetes- GFR-41.   # Diabtes [[facility] Hb A1c- 6.2]-PBG 1525-continue follow-up with endocrinology/monitoring of blood sugars.  # ?  History of celiac disease-defer to [facility]  # DISPOSITION: # Venofer today # follow up in 3 months- MD; labs- cbc/bmp;iron studies;ferritin-;possible venofer -Dr.B      All questions were answered. The patient knows to call the clinic with any problems, questions or  concerns.      Cammie Sickle, MD 04/16/2022 1:42 PM

## 2022-04-16 NOTE — Progress Notes (Signed)
Pt in for follow up states he has not noticed much change in energy levels

## 2022-04-27 ENCOUNTER — Encounter: Payer: Self-pay | Admitting: Internal Medicine

## 2022-04-28 ENCOUNTER — Ambulatory Visit (HOSPITAL_COMMUNITY)
Admission: RE | Admit: 2022-04-28 | Discharge: 2022-04-28 | Disposition: A | Discharge status: Home / Self Care | Payer: BC Managed Care – PPO | Source: Ambulatory Visit | Attending: Orthopedic Surgery | Admitting: Orthopedic Surgery

## 2022-04-28 DIAGNOSIS — M25531 Pain in right wrist: Secondary | ICD-10-CM | POA: Insufficient documentation

## 2022-04-28 MED ORDER — GADOBUTROL 1 MMOL/ML IV SOLN
9.0000 mL | Freq: Once | INTRAVENOUS | Status: AC | PRN
  Administered 2022-04-28: 9 mL via INTRAVENOUS

## 2022-04-30 ENCOUNTER — Ambulatory Visit: Payer: BC Managed Care – PPO | Admitting: Orthopedic Surgery

## 2022-04-30 ENCOUNTER — Telehealth: Payer: Self-pay | Admitting: Gastroenterology

## 2022-04-30 NOTE — Telephone Encounter (Signed)
Patients spouse left vm requesting a call back from [facility] in reference to the refill on pantoprazole.

## 2022-04-30 NOTE — Telephone Encounter (Signed)
Left message on voicemail.

## 2022-05-03 MED ORDER — PANTOPRAZOLE SODIUM 40 MG PO TBEC: 40.0000 mg | DELAYED_RELEASE_TABLET | Freq: Every evening | ORAL | 0 refills | Status: DC

## 2022-05-03 NOTE — Telephone Encounter (Signed)
Pt  spouse has called requesting refill on pantoprazole called into [facility] on [address]

## 2022-05-03 NOTE — Addendum Note (Signed)
Addended by: Lurlean Nanny on: 05/03/2022 11:43 AM   Modules accepted: Orders

## 2022-05-03 NOTE — Telephone Encounter (Signed)
Rx sent through e-scribe  

## 2022-05-05 NOTE — Telephone Encounter (Signed)
Patients spouse called requesting Threasa Beards give her a call in reference to a medication refill.

## 2022-05-05 NOTE — Telephone Encounter (Signed)
Rx was sent 6/19 and Brian Nielsen said pharmacy did notify her that they received Rx... Also advised f/u needed in November, she expressed understanding

## 2022-05-06 ENCOUNTER — Ambulatory Visit (INDEPENDENT_AMBULATORY_CARE_PROVIDER_SITE_OTHER): Payer: BC Managed Care – PPO | Admitting: Orthopedic Surgery

## 2022-05-06 DIAGNOSIS — M778 Other enthesopathies, not elsewhere classified: Secondary | ICD-10-CM

## 2022-05-06 MED ORDER — LIDOCAINE HCL 1 % IJ SOLN
1.0000 mL | INTRAMUSCULAR | Status: AC | PRN
  Administered 2022-05-06: 1 mL

## 2022-05-06 MED ORDER — BETAMETHASONE SOD PHOS & ACET 6 (3-3) MG/ML IJ SUSP
6.0000 mg | INTRAMUSCULAR | Status: AC | PRN
  Administered 2022-05-06: 6 mg via INTRA_ARTICULAR

## 2022-06-08 ENCOUNTER — Ambulatory Visit (INDEPENDENT_AMBULATORY_CARE_PROVIDER_SITE_OTHER): Payer: BC Managed Care – PPO | Admitting: Orthopedic Surgery

## 2022-06-08 DIAGNOSIS — M778 Other enthesopathies, not elsewhere classified: Secondary | ICD-10-CM

## 2022-06-08 NOTE — Progress Notes (Signed)
Office Visit Note   Patient: Brian Nielsen           Date of Birth: 1961-05-17           MRN: [id number] Visit Date: 06/08/2022              Requested by: Dion Body, MD [facility] [place],  [place] [address] PCP: Dion Body, MD   Assessment & Plan: Visit Diagnoses:  1. Extensor carpi ulnaris tendinitis     Plan: Patient has continued pain along the ECU tendon despite corticosteroid injection approximately 1 month ago.  He has some clicking in the wrist but hard to tell if this is coming from the ECU tendon but may be associated ECU instability.  Patient has some upcoming surgery for severe cataracts.  He is interested in surgical ECU tendon debridement with possible repair versus reconstruction of the ECU subsheath as necessary.  Not interested in more conservative management as this is failed so far.  He will contact the office when he is recovered from his upcoming eye surgery and would like to schedule the ECU debridement.  Follow-Up Instructions: No follow-ups on file.   Orders:  No orders of the defined types were placed in this encounter.  No orders of the defined types were placed in this encounter.     Procedures: No procedures performed   Clinical Data: No additional findings.   Subjective: Chief Complaint  Patient presents with   Right Wrist - Pain    This is a 61 year old right-hand-dominant male presents for follow-up of right ulnar-sided wrist pain.  He was last seen on 05/06/2022 which time we did a steroid junction into the ECU subsheath.  He had some temporary relief but his pain is returned.  He has erythema, pain, and swelling directly over the ECU tendon in the ulnar groove.  His pain is worse with prolonged activity.  Bracing is uncomfortable for him.  He is interested in discussing surgical management today.    Review of Systems   Objective: Vital Signs: There were no vitals taken for this  visit.  Physical Exam Constitutional:      Appearance: Normal appearance.  Cardiovascular:     Rate and Rhythm: Normal rate.     Pulses: Normal pulses.  Pulmonary:     Effort: Pulmonary effort is normal.  Skin:    General: Skin is warm and dry.     Capillary Refill: Capillary refill takes less than 2 seconds.  Neurological:     Mental Status: He is alert.     Right Hand Exam   Tenderness  Right hand tenderness location: TTP along ECU tendon around ulnar groove.  Range of Motion  The patient has normal right wrist ROM.   Other  Erythema: absent Sensation: normal Pulse: present  Comments:  Moderate swelling over ulnar head and ECU tendon. Pain w/ ECU synergy test.  Questionable instability with "ice cream scoop" motion.       Specialty Comments:  No specialty comments available.  Imaging: No results found.   PMFS History: Patient Active Problem List   Diagnosis Date Noted   Extensor carpi ulnaris tendinitis 05/06/2022   Loss of weight    Polyp of ascending colon    Gastritis without bleeding    Symptomatic anemia 05/28/2021   Thoracic spondylosis without myelopathy 11/22/2016   Vitamin D deficiency 11/22/2016   HLD (hyperlipidemia) 10/19/2016   BPH (benign prostatic hyperplasia) 10/19/2016   Essential hypertension  10/19/2016   Diabetes mellitus, insulin dependent (IDDM), controlled 10/19/2016   Celiac sprue 05/23/2014   Anemia, iron deficiency 04/19/2014   GERD (gastroesophageal reflux disease) 04/19/2014   Past Medical History:  Diagnosis Date   Adult celiac disease    Anemia    BPH (benign prostatic hyperplasia)    DM (diabetes mellitus) ([place])    Type II   GERD (gastroesophageal reflux disease)    HTN (hypertension)    Hyperlipidemia    Thoracic spondylosis without myelopathy 11/22/2016    Family History  Problem Relation Age of Onset   Aneurysm Mother        brain   Hypertension Mother    Cancer Father    Heart disease Father     Diabetes Father    Hypertension Father    Hyperlipidemia Father    Cancer Maternal Grandmother        melanoma   Heart disease Paternal Grandfather    Colon cancer Neg Hx     Past Surgical History:  Procedure Laterality Date   BIOPSY N/A 09/07/2021   Procedure: BIOPSY;  Surgeon: Lucilla Lame, MD;  Location: [place];  Service: Endoscopy;  Laterality: N/A;   COLONOSCOPY  2003   Dr. Laural Golden: two small polyps, small ext hemorrhoids.path:focal adenomatous changes   COLONOSCOPY N/A 05/03/2014   Procedure: COLONOSCOPY;  Surgeon: Danie Binder, MD;  [facility];  Service: Endoscopy;  Laterality: N/A;  12:00   COLONOSCOPY WITH PROPOFOL N/A 09/07/2021   Procedure: COLONOSCOPY WITH PROPOFOL;  Surgeon: Lucilla Lame, MD;  Location: [place];  Service: Endoscopy;  Laterality: N/A;   ESOPHAGOGASTRODUODENOSCOPY N/A 05/03/2014   Procedure: ESOPHAGOGASTRODUODENOSCOPY (EGD);  Surgeon: Danie Binder, MD;  [facility];  Service: Endoscopy;  Laterality: N/A;   ESOPHAGOGASTRODUODENOSCOPY (EGD) WITH PROPOFOL N/A 06/03/2020   Procedure: ESOPHAGOGASTRODUODENOSCOPY (EGD) WITH PROPOFOL;  Surgeon: [name], MD;  Location: [facility];  Service: Endoscopy;  Laterality: N/A;   ESOPHAGOGASTRODUODENOSCOPY (EGD) WITH PROPOFOL N/A 09/07/2021   Procedure: ESOPHAGOGASTRODUODENOSCOPY (EGD) WITH PROPOFOL;  Surgeon: Lucilla Lame, MD;  Location: [place];  Service: Endoscopy;  Laterality: N/A;  Diabetic   PARS PLANA VITRECTOMY Right 03/21/2021   Procedure: PARS PLANA VITRECTOMY 25 GAUGE WITH INJECTION OF ANTIBIOTICS FOR ENDOPHTHALMITIS;  Surgeon: Jalene Mullet, MD;  Location: [place];  Service: Ophthalmology;  Laterality: Right;   POLYPECTOMY N/A 09/07/2021   Procedure: POLYPECTOMY;  Surgeon: Lucilla Lame, MD;  Location: [place];  Service: Endoscopy;  Laterality: N/A;   Social History   Occupational History   Occupation: Programmer, systems:  [employer]    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

## 2022-06-17 ENCOUNTER — Other Ambulatory Visit (HOSPITAL_COMMUNITY): Payer: Self-pay | Admitting: Nephrology

## 2022-06-17 ENCOUNTER — Other Ambulatory Visit: Payer: Self-pay | Admitting: Nephrology

## 2022-06-17 DIAGNOSIS — E1129 Type 2 diabetes mellitus with other diabetic kidney complication: Secondary | ICD-10-CM

## 2022-06-17 DIAGNOSIS — E1122 Type 2 diabetes mellitus with diabetic chronic kidney disease: Secondary | ICD-10-CM

## 2022-06-17 DIAGNOSIS — N170 Acute kidney failure with tubular necrosis: Secondary | ICD-10-CM

## 2022-06-17 DIAGNOSIS — D638 Anemia in other chronic diseases classified elsewhere: Secondary | ICD-10-CM

## 2022-06-28 ENCOUNTER — Ambulatory Visit (HOSPITAL_COMMUNITY)
Admission: RE | Admit: 2022-06-28 | Discharge: 2022-06-28 | Disposition: A | Discharge status: Home / Self Care | Payer: BC Managed Care – PPO | Source: Ambulatory Visit | Attending: Nephrology | Admitting: Nephrology

## 2022-06-28 DIAGNOSIS — D638 Anemia in other chronic diseases classified elsewhere: Secondary | ICD-10-CM | POA: Insufficient documentation

## 2022-06-28 DIAGNOSIS — R809 Proteinuria, unspecified: Secondary | ICD-10-CM | POA: Insufficient documentation

## 2022-06-28 DIAGNOSIS — N170 Acute kidney failure with tubular necrosis: Secondary | ICD-10-CM | POA: Insufficient documentation

## 2022-06-28 DIAGNOSIS — E1129 Type 2 diabetes mellitus with other diabetic kidney complication: Secondary | ICD-10-CM | POA: Diagnosis present

## 2022-06-28 DIAGNOSIS — E1122 Type 2 diabetes mellitus with diabetic chronic kidney disease: Secondary | ICD-10-CM | POA: Insufficient documentation

## 2022-06-29 ENCOUNTER — Ambulatory Visit: Payer: BC Managed Care – PPO | Admitting: Urology

## 2022-07-08 ENCOUNTER — Ambulatory Visit: Payer: BC Managed Care – PPO | Admitting: Orthopedic Surgery

## 2022-07-12 ENCOUNTER — Inpatient Hospital Stay: Payer: BC Managed Care – PPO | Attending: Internal Medicine

## 2022-07-12 ENCOUNTER — Encounter: Payer: Self-pay | Admitting: Nurse Practitioner

## 2022-07-12 ENCOUNTER — Ambulatory Visit: Payer: BC Managed Care – PPO | Admitting: Internal Medicine

## 2022-07-12 ENCOUNTER — Inpatient Hospital Stay: Payer: BC Managed Care – PPO

## 2022-07-12 ENCOUNTER — Ambulatory Visit: Payer: BC Managed Care – PPO

## 2022-07-12 ENCOUNTER — Inpatient Hospital Stay (HOSPITAL_BASED_OUTPATIENT_CLINIC_OR_DEPARTMENT_OTHER): Payer: BC Managed Care – PPO | Admitting: Nurse Practitioner

## 2022-07-12 VITALS — BP 142/76 | HR 70 | Temp 96.4°F | Resp 16 | Wt 199.0 lb

## 2022-07-12 DIAGNOSIS — D649 Anemia, unspecified: Secondary | ICD-10-CM | POA: Insufficient documentation

## 2022-07-12 DIAGNOSIS — D509 Iron deficiency anemia, unspecified: Secondary | ICD-10-CM

## 2022-07-12 LAB — CBC WITH DIFFERENTIAL/PLATELET
Abs Immature Granulocytes: 0.02 10*3/uL (ref 0.00–0.07)
Basophils Absolute: 0 10*3/uL (ref 0.0–0.1)
Basophils Relative: 1 %
Eosinophils Absolute: 0.2 10*3/uL (ref 0.0–0.5)
Eosinophils Relative: 5 %
HCT: 35.5 % — ABNORMAL LOW (ref 39.0–52.0)
Hemoglobin: 11.3 g/dL — ABNORMAL LOW (ref 13.0–17.0)
Immature Granulocytes: 0 %
Lymphocytes Relative: 18 %
Lymphs Abs: 0.8 10*3/uL (ref 0.7–4.0)
MCH: 29 pg (ref 26.0–34.0)
MCHC: 31.8 g/dL (ref 30.0–36.0)
MCV: 91.3 fL (ref 80.0–100.0)
Monocytes Absolute: 0.5 10*3/uL (ref 0.1–1.0)
Monocytes Relative: 10 %
Neutro Abs: 3 10*3/uL (ref 1.7–7.7)
Neutrophils Relative %: 66 %
Platelets: 228 10*3/uL (ref 150–400)
RBC: 3.89 MIL/uL — ABNORMAL LOW (ref 4.22–5.81)
RDW: 13.4 % (ref 11.5–15.5)
WBC: 4.6 10*3/uL (ref 4.0–10.5)
nRBC: 0 % (ref 0.0–0.2)

## 2022-07-12 LAB — BASIC METABOLIC PANEL
Anion gap: 6 (ref 5–15)
BUN: 35 mg/dL — ABNORMAL HIGH (ref 6–20)
CO2: 20 mmol/L — ABNORMAL LOW (ref 22–32)
Calcium: 8.7 mg/dL — ABNORMAL LOW (ref 8.9–10.3)
Chloride: 111 mmol/L (ref 98–111)
Creatinine, Ser: 1.97 mg/dL — ABNORMAL HIGH (ref 0.61–1.24)
GFR, Estimated: 38 mL/min — ABNORMAL LOW (ref >60)
Glucose, Bld: 220 mg/dL — ABNORMAL HIGH (ref 70–99)
Potassium: 3.9 mmol/L (ref 3.5–5.1)
Sodium: 137 mmol/L (ref 135–145)

## 2022-07-12 LAB — IRON AND TIBC
Iron: 97 ug/dL (ref 45–182)
Saturation Ratios: 24 % (ref 17.9–39.5)
TIBC: 407 ug/dL (ref 250–450)
UIBC: 310 ug/dL

## 2022-07-12 LAB — FERRITIN: Ferritin: 236 ng/mL (ref 24–336)

## 2022-07-12 MED ORDER — SODIUM CHLORIDE 0.9 % IV SOLN
Freq: Once | INTRAVENOUS | Status: AC
  Filled 2022-07-12: qty 250

## 2022-07-12 MED ORDER — SODIUM CHLORIDE 0.9 % IV SOLN
200.0000 mg | Freq: Once | INTRAVENOUS | Status: AC
  Administered 2022-07-12: 200 mg via INTRAVENOUS
  Filled 2022-07-12: qty 200

## 2022-07-12 NOTE — Patient Instructions (Signed)

## 2022-07-12 NOTE — Progress Notes (Signed)
Returns for follow-up. Was told that his kidney function is worse. Upcoming wrist surgery next month.

## 2022-07-12 NOTE — Progress Notes (Signed)
Brian Nielsen CONSULT NOTE  Patient Care Team: Dion Body, MD as PCP - General (Family Medicine) [name], NP (Inactive) as Nurse Practitioner (Endocrinology) Cammie Sickle, MD as Consulting Physician (Oncology)  CHIEF COMPLAINTS/PURPOSE OF CONSULTATION: ANEMIA  HEMATOLOGY HISTORY:  # ANEMIA PROGRESSIVE since 2020 12; 2022- 10 MCV- 90s; colonoscopy 2015; multiple endoscopies-last September 2021-biopsy active celiac disease  #CKD stage III [[facility]]/diabetes [[facility]]; ? Celiac disease [[facility]];  - Labs: 07/2014 - tTG IgA 101 03/2020 - positive endomysial Ab IgA, tTG IgA 78 - CSY: 07/2014 - normal appearing terminal ileum, sigmoid diverticulosis, and non-bleeding internal hemorrhoids - EGD: 07/2014 - mild non-erosive gastritis, normal appearing duodenal mucosa with duodenal biopsies showing celiac sprue - EGD: 06/03/2020 - procedure aborted due to presence of food - EGD: 08/12/2020 - irregular Z-line found at [facility] with bx showing mild reflux esophagitis, 1 cm hiatal hernia, mild chronic gastritis, decreased folds found in entire duodenum, flattening found in entire duodenum and scalloped mucosa with bx showing active Celiac disease with villous atrophy, increased intraepithelial lymphocytes, etc. Repeat in 1-year.  HISTORY OF PRESENTING ILLNESS: Alone.  Ambulating independently.  Brian Nielsen 61 y.o. male symptomatic anemia-is here for follow-up. He has chronic fatigue which is unchanged with infusions of iron. Has ongoing elevated blood sugars. Had spaghetti for dinner last night. Reports frustration with dietary limitations. He has an apple orchard. Ongoing tendonitis. No black or bloody stools.   Review of Systems  Constitutional:  Positive for malaise/fatigue and weight loss. Negative for chills, diaphoresis and fever.  HENT:  Negative for nosebleeds and sore throat.   Eyes:  Negative for double vision.  Respiratory:  Positive for  shortness of breath. Negative for cough, hemoptysis, sputum production and wheezing.   Cardiovascular:  Negative for chest pain, palpitations, orthopnea and leg swelling.  Gastrointestinal:  Negative for blood in stool, heartburn, melena, nausea and vomiting.  Genitourinary:  Negative for dysuria, frequency and urgency.  Musculoskeletal:  Positive for back pain and joint pain.  Skin: Negative.  Negative for itching and rash.  Neurological:  Positive for weakness. Negative for dizziness, tingling, focal weakness and headaches.  Endo/Heme/Allergies:  Does not bruise/bleed easily.  Psychiatric/Behavioral:  Negative for depression. The patient is not nervous/anxious and does not have insomnia.     MEDICAL HISTORY:  Past Medical History:  Diagnosis Date   Adult celiac disease    Anemia    BPH (benign prostatic hyperplasia)    DM (diabetes mellitus) ([facility])    Type II   GERD (gastroesophageal reflux disease)    HTN (hypertension)    Hyperlipidemia    Thoracic spondylosis without myelopathy 11/22/2016    SURGICAL HISTORY: Past Surgical History:  Procedure Laterality Date   BIOPSY N/A 09/07/2021   Procedure: BIOPSY;  Surgeon: Lucilla Lame, MD;  Location: [place];  Service: Endoscopy;  Laterality: N/A;   COLONOSCOPY  2003   Dr. Laural Golden: two small polyps, small ext hemorrhoids.path:focal adenomatous changes   COLONOSCOPY N/A 05/03/2014   Procedure: COLONOSCOPY;  Surgeon: Danie Binder, MD;  [facility];  Service: Endoscopy;  Laterality: N/A;  12:00   COLONOSCOPY WITH PROPOFOL N/A 09/07/2021   Procedure: COLONOSCOPY WITH PROPOFOL;  Surgeon: Lucilla Lame, MD;  Location: [place];  Service: Endoscopy;  Laterality: N/A;   ESOPHAGOGASTRODUODENOSCOPY N/A 05/03/2014   Procedure: ESOPHAGOGASTRODUODENOSCOPY (EGD);  Surgeon: Danie Binder, MD;  [facility];  Service: Endoscopy;  Laterality: N/A;   ESOPHAGOGASTRODUODENOSCOPY (EGD) WITH PROPOFOL  N/A  06/03/2020   Procedure: ESOPHAGOGASTRODUODENOSCOPY (EGD) WITH PROPOFOL;  Surgeon: [name], MD;  Location: [facility];  Service: Endoscopy;  Laterality: N/A;   ESOPHAGOGASTRODUODENOSCOPY (EGD) WITH PROPOFOL N/A 09/07/2021   Procedure: ESOPHAGOGASTRODUODENOSCOPY (EGD) WITH PROPOFOL;  Surgeon: Lucilla Lame, MD;  Location: [place];  Service: Endoscopy;  Laterality: N/A;  Diabetic   PARS PLANA VITRECTOMY Right 03/21/2021   Procedure: PARS PLANA VITRECTOMY 25 GAUGE WITH INJECTION OF ANTIBIOTICS FOR ENDOPHTHALMITIS;  Surgeon: Jalene Mullet, MD;  Location: [place];  Service: Ophthalmology;  Laterality: Right;   POLYPECTOMY N/A 09/07/2021   Procedure: POLYPECTOMY;  Surgeon: Lucilla Lame, MD;  Location: [place];  Service: Endoscopy;  Laterality: N/A;    SOCIAL HISTORY: Social History   Socioeconomic History   Marital status: Married    Spouse name: [name]   Number of children: 1   Years of education: 12   Highest education level: Not on file  Occupational History   Occupation: Programmer, systems: [employer]    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  Other Topics Concern   Not on file  Social History Narrative   Lives with wife   [place]/chickens   Caffeine- diet Mtn Dew, 2 glasses   Social Determinants of Health   Financial Resource Strain: Not on file  Food Insecurity: Not on file  Transportation Needs: Not on file  Physical Activity: Not on file  Stress: Not on file  Social Connections: Not on file  Intimate Partner Violence: Not on file    FAMILY HISTORY: Family History  Problem  Relation Age of Onset   Aneurysm Mother        brain   Hypertension Mother    Cancer Father    Heart disease Father    Diabetes Father    Hypertension Father    Hyperlipidemia Father    Cancer Maternal Grandmother        melanoma   Heart disease Paternal Grandfather    Colon cancer Neg Hx     ALLERGIES:  is allergic to duloxetine, gluten meal, and codeine.  MEDICATIONS:  Current Outpatient Medications  Medication Sig Dispense Refill   Ascorbic Acid (VITAMIN C) 1000 MG tablet Take 1,000 mg by mouth daily.     aspirin 81 MG EC tablet Take 81 mg by mouth daily.     atorvastatin (LIPITOR) 40 MG tablet Take 1 tablet (40 mg total) by mouth daily. (Patient taking differently: Take 40 mg by mouth every evening.) 90 tablet 3   B Complex Vitamins (B COMPLEX PO) Take 1 tablet by mouth daily.     Cholecalciferol (DIALYVITE VITAMIN D 5000) 125 MCG (5000 UT) capsule Take 5,000 Units by mouth daily.     Digestive Enzymes (MULTI-ENZYME) TABS Take 1 tablet by mouth daily.     fenofibrate 160 MG tablet Take 160 mg by mouth daily.     gabapentin (NEURONTIN) 300 MG capsule Take by mouth.     hydrochlorothiazide (HYDRODIURIL) 12.5 MG tablet Take 12.5 mg by mouth 2 (two) times daily.  JARDIANCE 25 MG TABS tablet Take 25 mg by mouth every evening.     LANTUS SOLOSTAR 100 UNIT/ML Solostar Pen SMARTSIG:0-50 Unit(s) SUB-Q Daily     lisinopril (ZESTRIL) 10 MG tablet Take 10 mg by mouth at bedtime.     MOUNJARO 7.5 MG/0.5ML Pen SMARTSIG:7.5 Milligram(s) SUB-Q Once a Week     Multiple Vitamin (MULTIVITAMIN WITH MINERALS) TABS tablet Take 1 tablet by mouth daily.     pantoprazole (PROTONIX) 40 MG tablet Take 1 tablet (40 mg total) by mouth every evening. 90 tablet 0   albuterol (VENTOLIN HFA) 108 (90 Base) MCG/ACT inhaler Inhale 1-2 puffs into the lungs every 6 (six) hours as needed for wheezing or shortness of breath. (Patient not taking: Reported on 04/16/2022)     AMBULATORY NON FORMULARY MEDICATION  Medication Name: Trimix--30/4/40 (PAP/PHE/PGE)   Inject 0.3-0.5 mL intracorporal as needed, not more than 3 times a week   Ten 1 mL vials, 5 refills 10 vial 5   docusate sodium (COLACE) 100 MG capsule Take 200-300 mg by mouth daily as needed for mild constipation. (Patient not taking: Reported on 04/16/2022)     glipiZIDE (GLUCOTROL) 5 MG tablet Take 1 tablet (5 mg total) by mouth 2 (two) times daily before a meal. (Patient taking differently: Take 5 mg by mouth daily before breakfast.) 60 tablet 0   ofloxacin (OCUFLOX) 0.3 % ophthalmic solution Place 1-2 drops into both eyes daily as needed (eye injections).     Semaglutide, 1 MG/DOSE, (OZEMPIC, 1 MG/DOSE,) 2 MG/1.5ML SOPN Inject 2 mg into the skin every Friday.     No current facility-administered medications for this visit.   PHYSICAL EXAMINATION: Vitals:   07/12/22 1507  BP: (!) 142/76  Pulse: 70  Resp: 16  Temp: (!) 96.4 F (35.8 C)  SpO2: 99%   Filed Weights   07/12/22 1507  Weight: 199 lb (90.3 kg)    Physical Exam Constitutional:      Appearance: He is not ill-appearing.  Eyes:     General: No scleral icterus. Cardiovascular:     Rate and Rhythm: Normal rate and regular rhythm.  Pulmonary:     Effort: Pulmonary effort is normal.  Abdominal:     General: There is no distension.     Palpations: Abdomen is soft.     Tenderness: There is no abdominal tenderness. There is no guarding.  Musculoskeletal:        General: No deformity.     Right lower leg: No edema.     Left lower leg: No edema.  Lymphadenopathy:     Cervical: No cervical adenopathy.  Skin:    General: Skin is warm and dry.  Neurological:     Mental Status: He is alert and oriented to person, place, and time. Mental status is at baseline.  Psychiatric:        Mood and Affect: Mood normal.        Behavior: Behavior normal.     LABORATORY DATA:  I have reviewed the data as listed Lab Results  Component Value Date   WBC 4.6 07/12/2022   HGB 11.3  (L) 07/12/2022   HCT 35.5 (L) 07/12/2022   MCV 91.3 07/12/2022   PLT 228 07/12/2022   Recent Labs    12/09/21 1545 01/13/22 1246 04/16/22 1246 07/12/22 1423  NA 136 136 138 137  K 3.7 3.9 3.7 3.9  CL 109 108 108 111  CO2 19* 23 21* 20*  GLUCOSE 355* 339* 152* 220*  BUN 37* 44* 28* 35*  CREATININE 2.20* 2.62* 1.85* 1.97*  CALCIUM 8.2*  8.5* 8.5* 8.3* 8.7*  GFRNONAA 33* 27* 41* 38*  ALBUMIN 3.6  --   --   --    Iron/TIBC/Ferritin/ %Sat    Component Value Date/Time   IRON 83 04/16/2022 1246   TIBC 417 04/16/2022 1246   FERRITIN 228 04/16/2022 1246   IRONPCTSAT 20 04/16/2022 1246    US RENAL  Result Date: 06/28/2022 CLINICAL DATA:  Type 2 diabetes Acute kidney failure with tubular necrosis EXAM: RENAL / URINARY TRACT ULTRASOUND COMPLETE COMPARISON:  07/07/2021 FINDINGS: Right Kidney: Renal measurements: 12.4 x 6.8 x 6.3 cm = volume: 275 mL. Echogenicity within normal limits. No mass or hydronephrosis visualized. Left Kidney: Renal measurements: 11.0 x 7.3 x 5.8 cm = volume: 240 mL. Echogenicity within normal limits. No mass or hydronephrosis visualized. Bladder: Appears normal for degree of bladder distention. Other: None. IMPRESSION: No significant sonographic abnormality of the kidneys. Electronically Signed   By: Miachel Roux M.D.   On: 06/28/2022 08:17     Assessment & Plan:  No problem-specific Assessment & Plan notes found for this encounter.  Symptomatic anemia # Anemia-symptomatic fatigue hemoglobin 9-10-unclear etiology; however suspect multifactorial- iron malabsorption/celiac disease/CKD-stage III.    # Current Hb- 11.3; proceed with IV venofer today.  Discussed that he will need likely maintenance IV iron every few months. Proceed with venofer today.   # Fatigue- likely secondary to chronic disease, hyperglycemia, anemia.    # Ckd stage III [GFR]-diabetes- GFR-38.    # Diabtes [[facility] Hb A1c- 6.2]- glucose 220 today. Continue follow-up with  endocrinology/monitoring of blood sugars. Recommended dietary discretion, low carb diet.    # History of celiac disease- defer to [facility]   # DISPOSITION: Venofer today 3 mo- lab (cbc, bmp, ferritin, iron studies) Few days later- see Dr. Rogue Bussing, +/- venofer- la  All questions were answered. The patient knows to call the clinic with any problems, questions or concerns.  Brian Au, NP 07/12/2022

## 2022-07-13 ENCOUNTER — Ambulatory Visit: Payer: BC Managed Care – PPO

## 2022-07-13 ENCOUNTER — Ambulatory Visit: Payer: BC Managed Care – PPO | Admitting: Internal Medicine

## 2022-07-13 ENCOUNTER — Other Ambulatory Visit: Payer: Self-pay

## 2022-07-13 ENCOUNTER — Other Ambulatory Visit: Payer: BC Managed Care – PPO

## 2022-07-13 DIAGNOSIS — D509 Iron deficiency anemia, unspecified: Secondary | ICD-10-CM

## 2022-07-26 DIAGNOSIS — M25531 Pain in right wrist: Secondary | ICD-10-CM | POA: Insufficient documentation

## 2022-07-26 DIAGNOSIS — M67839 Other specified disorders of synovium and tendon, unspecified forearm: Secondary | ICD-10-CM | POA: Insufficient documentation

## 2022-07-27 ENCOUNTER — Ambulatory Visit: Payer: BC Managed Care – PPO

## 2022-07-27 ENCOUNTER — Other Ambulatory Visit: Payer: BC Managed Care – PPO

## 2022-07-27 ENCOUNTER — Ambulatory Visit: Payer: BC Managed Care – PPO | Admitting: Internal Medicine

## 2022-08-03 ENCOUNTER — Other Ambulatory Visit: Payer: Self-pay

## 2022-08-03 ENCOUNTER — Encounter (HOSPITAL_BASED_OUTPATIENT_CLINIC_OR_DEPARTMENT_OTHER): Payer: Self-pay | Admitting: Orthopedic Surgery

## 2022-08-09 ENCOUNTER — Encounter (HOSPITAL_BASED_OUTPATIENT_CLINIC_OR_DEPARTMENT_OTHER)
Admission: RE | Admit: 2022-08-09 | Discharge: 2022-08-09 | Disposition: A | Discharge status: Home / Self Care | Payer: BC Managed Care – PPO | Source: Ambulatory Visit | Attending: Orthopedic Surgery | Admitting: Orthopedic Surgery

## 2022-08-09 DIAGNOSIS — I129 Hypertensive chronic kidney disease with stage 1 through stage 4 chronic kidney disease, or unspecified chronic kidney disease: Secondary | ICD-10-CM | POA: Diagnosis not present

## 2022-08-09 DIAGNOSIS — M1990 Unspecified osteoarthritis, unspecified site: Secondary | ICD-10-CM | POA: Diagnosis not present

## 2022-08-09 DIAGNOSIS — Z01812 Encounter for preprocedural laboratory examination: Secondary | ICD-10-CM | POA: Insufficient documentation

## 2022-08-09 DIAGNOSIS — E1122 Type 2 diabetes mellitus with diabetic chronic kidney disease: Secondary | ICD-10-CM | POA: Diagnosis not present

## 2022-08-09 DIAGNOSIS — Z7984 Long term (current) use of oral hypoglycemic drugs: Secondary | ICD-10-CM | POA: Diagnosis not present

## 2022-08-09 DIAGNOSIS — N189 Chronic kidney disease, unspecified: Secondary | ICD-10-CM | POA: Diagnosis not present

## 2022-08-09 DIAGNOSIS — M778 Other enthesopathies, not elsewhere classified: Secondary | ICD-10-CM | POA: Diagnosis not present

## 2022-08-09 DIAGNOSIS — K219 Gastro-esophageal reflux disease without esophagitis: Secondary | ICD-10-CM | POA: Diagnosis not present

## 2022-08-09 DIAGNOSIS — Z79899 Other long term (current) drug therapy: Secondary | ICD-10-CM | POA: Diagnosis not present

## 2022-08-09 DIAGNOSIS — Z794 Long term (current) use of insulin: Secondary | ICD-10-CM | POA: Diagnosis not present

## 2022-08-09 DIAGNOSIS — E669 Obesity, unspecified: Secondary | ICD-10-CM | POA: Diagnosis not present

## 2022-08-09 DIAGNOSIS — E785 Hyperlipidemia, unspecified: Secondary | ICD-10-CM | POA: Diagnosis not present

## 2022-08-09 LAB — BASIC METABOLIC PANEL
Anion gap: 6 (ref 5–15)
BUN: 28 mg/dL — ABNORMAL HIGH (ref 6–20)
CO2: 22 mmol/L (ref 22–32)
Calcium: 8.4 mg/dL — ABNORMAL LOW (ref 8.9–10.3)
Chloride: 114 mmol/L — ABNORMAL HIGH (ref 98–111)
Creatinine, Ser: 2.35 mg/dL — ABNORMAL HIGH (ref 0.61–1.24)
GFR, Estimated: 31 mL/min — ABNORMAL LOW (ref >60)
Glucose, Bld: 257 mg/dL — ABNORMAL HIGH (ref 70–99)
Potassium: 4.4 mmol/L (ref 3.5–5.1)
Sodium: 142 mmol/L (ref 135–145)

## 2022-08-10 ENCOUNTER — Ambulatory Visit: Payer: BC Managed Care – PPO | Admitting: Urology

## 2022-08-10 NOTE — Progress Notes (Signed)
Glucose-257, Dr. Daiva Huge aware, will recheck day of surgery.

## 2022-08-11 ENCOUNTER — Encounter (HOSPITAL_BASED_OUTPATIENT_CLINIC_OR_DEPARTMENT_OTHER): Payer: Self-pay | Admitting: Orthopedic Surgery

## 2022-08-11 ENCOUNTER — Other Ambulatory Visit: Payer: Self-pay

## 2022-08-11 ENCOUNTER — Ambulatory Visit (HOSPITAL_BASED_OUTPATIENT_CLINIC_OR_DEPARTMENT_OTHER): Payer: BC Managed Care – PPO | Admitting: Anesthesiology

## 2022-08-11 ENCOUNTER — Ambulatory Visit (HOSPITAL_BASED_OUTPATIENT_CLINIC_OR_DEPARTMENT_OTHER)
Admission: RE | Admit: 2022-08-11 | Discharge: 2022-08-11 | Disposition: A | Discharge status: Home / Self Care | Payer: BC Managed Care – PPO | Attending: Orthopedic Surgery | Admitting: Orthopedic Surgery

## 2022-08-11 ENCOUNTER — Encounter (HOSPITAL_BASED_OUTPATIENT_CLINIC_OR_DEPARTMENT_OTHER)
Admission: RE | Disposition: A | Discharge status: Home / Self Care | Payer: Self-pay | Source: Home / Self Care | Attending: Orthopedic Surgery

## 2022-08-11 DIAGNOSIS — E119 Type 2 diabetes mellitus without complications: Secondary | ICD-10-CM

## 2022-08-11 DIAGNOSIS — Z7984 Long term (current) use of oral hypoglycemic drugs: Secondary | ICD-10-CM | POA: Insufficient documentation

## 2022-08-11 DIAGNOSIS — I129 Hypertensive chronic kidney disease with stage 1 through stage 4 chronic kidney disease, or unspecified chronic kidney disease: Secondary | ICD-10-CM | POA: Insufficient documentation

## 2022-08-11 DIAGNOSIS — M1990 Unspecified osteoarthritis, unspecified site: Secondary | ICD-10-CM | POA: Insufficient documentation

## 2022-08-11 DIAGNOSIS — E669 Obesity, unspecified: Secondary | ICD-10-CM | POA: Insufficient documentation

## 2022-08-11 DIAGNOSIS — M778 Other enthesopathies, not elsewhere classified: Secondary | ICD-10-CM | POA: Insufficient documentation

## 2022-08-11 DIAGNOSIS — K219 Gastro-esophageal reflux disease without esophagitis: Secondary | ICD-10-CM | POA: Insufficient documentation

## 2022-08-11 DIAGNOSIS — E1122 Type 2 diabetes mellitus with diabetic chronic kidney disease: Secondary | ICD-10-CM | POA: Insufficient documentation

## 2022-08-11 DIAGNOSIS — Z01818 Encounter for other preprocedural examination: Secondary | ICD-10-CM

## 2022-08-11 DIAGNOSIS — N189 Chronic kidney disease, unspecified: Secondary | ICD-10-CM | POA: Insufficient documentation

## 2022-08-11 DIAGNOSIS — Z79899 Other long term (current) drug therapy: Secondary | ICD-10-CM | POA: Insufficient documentation

## 2022-08-11 DIAGNOSIS — E785 Hyperlipidemia, unspecified: Secondary | ICD-10-CM | POA: Insufficient documentation

## 2022-08-11 DIAGNOSIS — Z794 Long term (current) use of insulin: Secondary | ICD-10-CM | POA: Insufficient documentation

## 2022-08-11 HISTORY — DX: Chronic kidney disease, unspecified: N18.9

## 2022-08-11 HISTORY — PX: REPAIR EXTENSOR TENDON: SHX5382

## 2022-08-11 LAB — GLUCOSE, CAPILLARY
Glucose-Capillary: 198 mg/dL — ABNORMAL HIGH (ref 70–99)
Glucose-Capillary: 164 mg/dL — ABNORMAL HIGH (ref 70–99)

## 2022-08-11 SURGERY — REPAIR, TENDON, EXTENSOR
Anesthesia: General | Site: Arm Lower | Laterality: Right

## 2022-08-11 MED ORDER — OXYCODONE HCL 5 MG PO TABS: 5.0000 mg | ORAL_TABLET | Freq: Four times a day (QID) | ORAL | 0 refills | Status: AC | PRN

## 2022-08-11 MED ORDER — DEXAMETHASONE SODIUM PHOSPHATE 10 MG/ML IJ SOLN
INTRAMUSCULAR | Status: AC
  Filled 2022-08-11: qty 1

## 2022-08-11 MED ORDER — ACETAMINOPHEN 500 MG PO TABS
ORAL_TABLET | ORAL | Status: AC
  Filled 2022-08-11: qty 2

## 2022-08-11 MED ORDER — FENTANYL CITRATE (PF) 100 MCG/2ML IJ SOLN
INTRAMUSCULAR | Status: AC
  Filled 2022-08-11: qty 2

## 2022-08-11 MED ORDER — CEFAZOLIN SODIUM-DEXTROSE 2-4 GM/100ML-% IV SOLN
2.0000 g | INTRAVENOUS | Status: AC
  Administered 2022-08-11: 2 g via INTRAVENOUS

## 2022-08-11 MED ORDER — INSULIN ASPART 100 UNIT/ML IJ SOLN
2.0000 [IU] | Freq: Once | INTRAMUSCULAR | Status: AC
  Administered 2022-08-11: 2 [IU] via SUBCUTANEOUS

## 2022-08-11 MED ORDER — INSULIN REGULAR HUMAN 100 UNIT/ML IJ SOLN: 2.0000 [IU] | Freq: Once | INTRAMUSCULAR | Status: DC

## 2022-08-11 MED ORDER — ONDANSETRON HCL 4 MG/2ML IJ SOLN
INTRAMUSCULAR | Status: DC | PRN
  Administered 2022-08-11: 4 mg via INTRAVENOUS

## 2022-08-11 MED ORDER — FENTANYL CITRATE (PF) 100 MCG/2ML IJ SOLN: 25.0000 ug | INTRAMUSCULAR | Status: DC | PRN

## 2022-08-11 MED ORDER — PHENYLEPHRINE 80 MCG/ML (10ML) SYRINGE FOR IV PUSH (FOR BLOOD PRESSURE SUPPORT)
PREFILLED_SYRINGE | INTRAVENOUS | Status: AC
  Filled 2022-08-11: qty 10

## 2022-08-11 MED ORDER — LIDOCAINE HCL (CARDIAC) PF 100 MG/5ML IV SOSY
PREFILLED_SYRINGE | INTRAVENOUS | Status: DC | PRN
  Administered 2022-08-11: 80 mg via INTRATRACHEAL

## 2022-08-11 MED ORDER — DEXAMETHASONE SODIUM PHOSPHATE 10 MG/ML IJ SOLN
INTRAMUSCULAR | Status: DC | PRN
  Administered 2022-08-11: 5 mg via INTRAVENOUS

## 2022-08-11 MED ORDER — MIDAZOLAM HCL 2 MG/2ML IJ SOLN
2.0000 mg | Freq: Once | INTRAMUSCULAR | Status: AC
  Administered 2022-08-11: 2 mg via INTRAVENOUS

## 2022-08-11 MED ORDER — MIDAZOLAM HCL 2 MG/2ML IJ SOLN
INTRAMUSCULAR | Status: AC
  Filled 2022-08-11: qty 2

## 2022-08-11 MED ORDER — FENTANYL CITRATE (PF) 100 MCG/2ML IJ SOLN
50.0000 ug | Freq: Once | INTRAMUSCULAR | Status: AC
  Administered 2022-08-11: 50 ug via INTRAVENOUS

## 2022-08-11 MED ORDER — EPHEDRINE SULFATE (PRESSORS) 50 MG/ML IJ SOLN
INTRAMUSCULAR | Status: DC | PRN
  Administered 2022-08-11: 10 mg via INTRAVENOUS
  Administered 2022-08-11: 5 mg via INTRAVENOUS
  Administered 2022-08-11: 10 mg via INTRAVENOUS

## 2022-08-11 MED ORDER — PHENYLEPHRINE HCL (PRESSORS) 10 MG/ML IV SOLN
INTRAVENOUS | Status: DC | PRN
  Administered 2022-08-11 (×2): 80 ug via INTRAVENOUS

## 2022-08-11 MED ORDER — PROPOFOL 10 MG/ML IV BOLUS
INTRAVENOUS | Status: AC
  Filled 2022-08-11: qty 20

## 2022-08-11 MED ORDER — PROPOFOL 10 MG/ML IV BOLUS
INTRAVENOUS | Status: DC | PRN
  Administered 2022-08-11: 180 mg via INTRAVENOUS

## 2022-08-11 MED ORDER — EPHEDRINE 5 MG/ML INJ
INTRAVENOUS | Status: AC
  Filled 2022-08-11: qty 5

## 2022-08-11 MED ORDER — BUPIVACAINE-EPINEPHRINE (PF) 0.5% -1:200000 IJ SOLN
INTRAMUSCULAR | Status: DC | PRN
  Administered 2022-08-11: 30 mL via PERINEURAL

## 2022-08-11 MED ORDER — LIDOCAINE 2% (20 MG/ML) 5 ML SYRINGE
INTRAMUSCULAR | Status: AC
  Filled 2022-08-11: qty 5

## 2022-08-11 MED ORDER — PROMETHAZINE HCL 25 MG/ML IJ SOLN: 6.2500 mg | INTRAMUSCULAR | Status: DC | PRN

## 2022-08-11 MED ORDER — INSULIN ASPART 100 UNIT/ML IJ SOLN
INTRAMUSCULAR | Status: AC
  Filled 2022-08-11: qty 1

## 2022-08-11 MED ORDER — 0.9 % SODIUM CHLORIDE (POUR BTL) OPTIME
TOPICAL | Status: DC | PRN
  Administered 2022-08-11: 120 mL

## 2022-08-11 MED ORDER — LACTATED RINGERS IV SOLN: INTRAVENOUS | Status: DC

## 2022-08-11 MED ORDER — ONDANSETRON HCL 4 MG/2ML IJ SOLN
INTRAMUSCULAR | Status: AC
  Filled 2022-08-11: qty 2

## 2022-08-11 MED ORDER — CEFAZOLIN SODIUM-DEXTROSE 2-4 GM/100ML-% IV SOLN
INTRAVENOUS | Status: AC
  Filled 2022-08-11: qty 100

## 2022-08-11 MED ORDER — FENTANYL CITRATE (PF) 100 MCG/2ML IJ SOLN
INTRAMUSCULAR | Status: DC | PRN
  Administered 2022-08-11: 50 ug via INTRAVENOUS

## 2022-08-11 MED ORDER — BUPIVACAINE HCL (PF) 0.25 % IJ SOLN
INTRAMUSCULAR | Status: AC
  Filled 2022-08-11: qty 30

## 2022-08-11 MED ORDER — ACETAMINOPHEN 500 MG PO TABS
1000.0000 mg | ORAL_TABLET | Freq: Once | ORAL | Status: AC
  Administered 2022-08-11: 1000 mg via ORAL

## 2022-08-11 SURGICAL SUPPLY — 62 items
APL PRP STRL LF DISP 70% ISPRP (MISCELLANEOUS) ×1
BLADE MINI RND TIP GREEN BEAV (BLADE) IMPLANT
BLADE SURG 15 STRL LF DISP TIS (BLADE) ×2 IMPLANT
BLADE SURG 15 STRL SS (BLADE) ×4
BNDG CMPR 9X4 STRL LF SNTH (GAUZE/BANDAGES/DRESSINGS) ×1
BNDG ELASTIC 2X5.8 VLCR STR LF (GAUZE/BANDAGES/DRESSINGS) IMPLANT
BNDG ELASTIC 3X5.8 VLCR STR LF (GAUZE/BANDAGES/DRESSINGS) ×1 IMPLANT
BNDG ELASTIC 4X5.8 VLCR STR LF (GAUZE/BANDAGES/DRESSINGS) IMPLANT
BNDG ESMARK 4X9 LF (GAUZE/BANDAGES/DRESSINGS) ×1 IMPLANT
BNDG GAUZE DERMACEA FLUFF 4 (GAUZE/BANDAGES/DRESSINGS) ×1 IMPLANT
BNDG GZE DERMACEA 4 6PLY (GAUZE/BANDAGES/DRESSINGS) ×1
CATH ROBINSON RED A/P 10FR (CATHETERS) IMPLANT
CHLORAPREP W/TINT 26 (MISCELLANEOUS) ×1 IMPLANT
CORD BIPOLAR FORCEPS 12FT (ELECTRODE) ×1 IMPLANT
COVER BACK TABLE 60X90IN (DRAPES) ×1 IMPLANT
COVER MAYO STAND STRL (DRAPES) ×1 IMPLANT
CUFF TOURN SGL QUICK 18X4 (TOURNIQUET CUFF) ×1 IMPLANT
DRAPE EXTREMITY T 121X128X90 (DISPOSABLE) ×1 IMPLANT
DRAPE OEC MINIVIEW 54X84 (DRAPES) IMPLANT
DRAPE SURG 17X23 STRL (DRAPES) ×1 IMPLANT
GAUZE 4X4 16PLY ~~LOC~~+RFID DBL (SPONGE) IMPLANT
GAUZE PAD ABD 8X10 STRL (GAUZE/BANDAGES/DRESSINGS) IMPLANT
GAUZE XEROFORM 1X8 LF (GAUZE/BANDAGES/DRESSINGS) ×1 IMPLANT
GLOVE BIO SURGEON STRL SZ7 (GLOVE) ×1 IMPLANT
GLOVE BIOGEL PI IND STRL 7.0 (GLOVE) ×1 IMPLANT
GOWN STRL REUS W/ TWL LRG LVL3 (GOWN DISPOSABLE) ×2 IMPLANT
GOWN STRL REUS W/TWL LRG LVL3 (GOWN DISPOSABLE) ×2
LOOP VESSEL MAXI BLUE (MISCELLANEOUS) IMPLANT
NDL HYPO 25X1 1.5 SAFETY (NEEDLE) IMPLANT
NDL KEITH (NEEDLE) IMPLANT
NEEDLE HYPO 25X1 1.5 SAFETY (NEEDLE) IMPLANT
NEEDLE KEITH (NEEDLE) IMPLANT
NS IRRIG 1000ML POUR BTL (IV SOLUTION) ×1 IMPLANT
PACK BASIN DAY SURGERY FS (CUSTOM PROCEDURE TRAY) ×1 IMPLANT
PAD CAST 3X4 CTTN HI CHSV (CAST SUPPLIES) ×1 IMPLANT
PAD CAST 4YDX4 CTTN HI CHSV (CAST SUPPLIES) IMPLANT
PADDING CAST ABS COTTON 3X4 (CAST SUPPLIES) IMPLANT
PADDING CAST ABS COTTON 4X4 ST (CAST SUPPLIES) ×1 IMPLANT
PADDING CAST COTTON 3X4 STRL (CAST SUPPLIES) ×1
PADDING CAST COTTON 4X4 STRL (CAST SUPPLIES)
SLEEVE SCD COMPRESS KNEE MED (STOCKING) IMPLANT
SLING ARM FOAM STRAP LRG (SOFTGOODS) IMPLANT
SPIKE FLUID TRANSFER (MISCELLANEOUS) IMPLANT
SPLINT FIBERGLASS 4X30 (CAST SUPPLIES) ×1 IMPLANT
SPLINT PLASTER CAST XFAST 3X15 (CAST SUPPLIES) IMPLANT
SUT ETHIBOND 3-0 V-5 (SUTURE) IMPLANT
SUT ETHILON 3 0 PS 1 (SUTURE) IMPLANT
SUT ETHILON 4 0 PS 2 18 (SUTURE) ×1 IMPLANT
SUT FIBERWIRE 3-0 18 TAPR NDL (SUTURE)
SUT FIBERWIRE 4-0 18 DIAM BLUE (SUTURE)
SUT MERSILENE 4 0 P 3 (SUTURE) IMPLANT
SUT MNCRL AB 4-0 PS2 18 (SUTURE) IMPLANT
SUT MON AB 5-0 PS2 18 (SUTURE) IMPLANT
SUT PROLENE 6 0 P 1 18 (SUTURE) IMPLANT
SUT SILK 2 0 PERMA HAND 18 BK (SUTURE) IMPLANT
SUT SUPRAMID 4-0 (SUTURE) IMPLANT
SUTURE FIBERWR 3-0 18 TAPR NDL (SUTURE) IMPLANT
SUTURE FIBERWR 4-0 18 DIA BLUE (SUTURE) IMPLANT
SYR BULB EAR ULCER 3OZ GRN STR (SYRINGE) ×1 IMPLANT
SYR CONTROL 10ML LL (SYRINGE) IMPLANT
TOWEL GREEN STERILE FF (TOWEL DISPOSABLE) ×2 IMPLANT
UNDERPAD 30X36 HEAVY ABSORB (UNDERPADS AND DIAPERS) ×1 IMPLANT

## 2022-08-11 NOTE — Interval H&P Note (Signed)
History and Physical Interval Note:  08/11/2022 7:27 AM  Brian Nielsen  has presented today for surgery, with the diagnosis of Right extensor carpi ulnaris tendonitis.  The various methods of treatment have been discussed with the patient and family. After consideration of risks, benefits and other options for treatment, the patient has consented to  Procedure(s) with comments: EXTENSOR CARPI ULNARIS TENDON DEBRIDEMENT, POSSIBLE SUBSHEATH RECONSTRUCTION (Right) - or regional plus MAC as a surgical intervention.  The patient's history has been reviewed, patient examined, no change in status, stable for surgery.  I have reviewed the patient's chart and labs.  Questions were answered to the patient's satisfaction.     [name] [name]

## 2022-08-11 NOTE — Anesthesia Postprocedure Evaluation (Signed)
Anesthesia Post Note  Patient: Brian Nielsen  Procedure(s) Performed: EXTENSOR CARPI ULNARIS TENDON DEBRIDEMENT, POSSIBLE SUBSHEATH RECONSTRUCTION (Right: Arm Lower)     Patient location during evaluation: PACU Anesthesia Type: General Level of consciousness: awake and alert Pain management: pain level controlled Vital Signs Assessment: post-procedure vital signs reviewed and stable Respiratory status: spontaneous breathing, nonlabored ventilation, respiratory function stable and patient connected to nasal cannula oxygen Cardiovascular status: blood pressure returned to baseline and stable Postop Assessment: no apparent nausea or vomiting Anesthetic complications: no   No notable events documented.  Last Vitals:  Vitals:   08/11/22 0930 08/11/22 0939  BP: (!) 150/67 (!) 151/78  Pulse: 68 72  Resp: 19 18  Temp:  36.8 C  SpO2: 92% 95%    Last Pain:  Vitals:   08/11/22 0939  TempSrc:   PainSc: 0-No pain                 Santa Lighter

## 2022-08-11 NOTE — Anesthesia Preprocedure Evaluation (Addendum)
Anesthesia Evaluation  Patient identified by MRN, date of birth, ID band Patient awake    Reviewed: Allergy & Precautions, NPO status , Patient's Chart, lab work & pertinent test results  Airway Mallampati: III  TM Distance: <3 FB Neck ROM: Full    Dental  (+) Teeth Intact, Dental Advisory Given   Pulmonary neg pulmonary ROS,    Pulmonary exam normal breath sounds clear to auscultation       Cardiovascular hypertension, Pt. on medications Normal cardiovascular exam Rhythm:Regular Rate:Normal     Neuro/Psych negative neurological ROS     GI/Hepatic Neg liver ROS, GERD  Medicated,  Endo/Other  diabetes, Type 2, Oral Hypoglycemic Agents, Insulin DependentObesity   Renal/GU Renal InsufficiencyRenal disease     Musculoskeletal  (+) Arthritis ,   Abdominal   Peds  Hematology  (+) Blood dyscrasia, anemia ,   Anesthesia Other Findings Day of surgery medications reviewed with the patient.  Reproductive/Obstetrics                             Anesthesia Physical Anesthesia Plan  ASA: 3  Anesthesia Plan: General   Post-op Pain Management: Regional block* and Tylenol PO (pre-op)*   Induction: Intravenous  PONV Risk Score and Plan: 2 and Treatment may vary due to age or medical condition, Midazolam, Dexamethasone and Ondansetron  Airway Management Planned: LMA  Additional Equipment:   Intra-op Plan:   Post-operative Plan:   Informed Consent: I have reviewed the patients History and Physical, chart, labs and discussed the procedure including the risks, benefits and alternatives for the proposed anesthesia with the patient or authorized representative who has indicated his/her understanding and acceptance.     Dental advisory given  Plan Discussed with: CRNA  Anesthesia Plan Comments:        Anesthesia Quick Evaluation

## 2022-08-11 NOTE — Discharge Instructions (Addendum)
No tylenol until 1 p.m.  Post Anesthesia Home Care Instructions  Activity: Get plenty of rest for the remainder of the day. A responsible individual must stay with you for 24 hours following the procedure.  For the next 24 hours, DO NOT: -Drive a car -Paediatric nurse -Drink alcoholic beverages -Take any medication unless instructed by your physician -Make any legal decisions or sign important papers.  Meals: Start with liquid foods such as gelatin or soup. Progress to regular foods as tolerated. Avoid greasy, spicy, heavy foods. If nausea and/or vomiting occur, drink only clear liquids until the nausea and/or vomiting subsides. Call your physician if vomiting continues.  Special Instructions/Symptoms: Your throat may feel dry or sore from the anesthesia or the breathing tube placed in your throat during surgery. If this causes discomfort, gargle with warm salt water. The discomfort should disappear within 24 hours.  If you had a scopolamine patch placed behind your ear for the management of post- operative nausea and/or vomiting:  1. The medication in the patch is effective for 72 hours, after which it should be removed.  Wrap patch in a tissue and discard in the trash. Wash hands thoroughly with soap and water. 2. You may remove the patch earlier than 72 hours if you experience unpleasant side effects which may include dry mouth, dizziness or visual disturbances. 3. Avoid touching the patch. Wash your hands with soap and water after contact with the patch.     Regional Anesthesia Blocks  1. Numbness or the inability to move the "blocked" extremity may last from 3-48 hours after placement. The length of time depends on the medication injected and your individual response to the medication. If the numbness is not going away after 48 hours, call your surgeon.  2. The extremity that is blocked will need to be protected until the numbness is gone and the  Strength has returned. Because you  cannot feel it, you will need to take extra care to avoid injury. Because it may be weak, you may have difficulty moving it or using it. You may not know what position it is in without looking at it while the block is in effect.  3. For blocks in the legs and feet, returning to weight bearing and walking needs to be done carefully. You will need to wait until the numbness is entirely gone and the strength has returned. You should be able to move your leg and foot normally before you try and bear weight or walk. You will need someone to be with you when you first try to ensure you do not fall and possibly risk injury.  4. Bruising and tenderness at the needle site are common side effects and will resolve in a few days.  5. Persistent numbness or new problems with movement should be communicated to the surgeon or the [facility] [phone] [place] [phone]).      Audria Nine, M.D. Hand Surgery  POST-OPERATIVE DISCHARGE INSTRUCTIONS   PRESCRIPTIONS: You may have been given a prescription to be taken as directed for post-operative pain control.  You may also take over the counter ibuprofen/aleve and tylenol for pain. Take this as directed on the packaging. Do not exceed 3000 mg tylenol/acetaminophen in 24 hours.  Ibuprofen 600-800 mg (3-4) tablets by mouth every 6 hours as needed for pain.  OR Aleve 2 tablets by mouth every 12 hours (twice daily) as needed for pain.  AND/OR Tylenol 1000 mg (2 tablets) every 8  hours as needed for pain.  Please use your pain medication carefully, as refills are limited and you may not be provided with one.  As stated above, please use over the counter pain medicine - it will also be helpful with decreasing your swelling.    ANESTHESIA: After your surgery, post-surgical discomfort or pain is likely. This discomfort can last several days to a few weeks. At certain times of the day your discomfort may be more intense.    Did you receive a nerve block?  A nerve block can provide pain relief for one hour to two days after your surgery. As long as the nerve block is working, you will experience little or no sensation in the area the surgeon operated on.  As the nerve block wears off, you will begin to experience pain or discomfort. It is very important that you begin taking your prescribed pain medication before the nerve block fully wears off. Treating your pain at the first sign of the block wearing off will ensure your pain is better controlled and more tolerable when full-sensation returns. Do not wait until the pain is intolerable, as the medicine will be less effective. It is better to treat pain in advance than to try and catch up.   General Anesthesia:  If you did not receive a nerve block during your surgery, you will need to start taking your pain medication shortly after your surgery and should continue to do so as prescribed by your surgeon.     ICE AND ELEVATION: You may use ice for the first 48-72 hours, but it is not critical.   Motion of your fingers is very important to decrease the swelling.  Elevation, as much as possible for the next 48 hours, is critical for decreasing swelling as well as for pain relief. Elevation means when you are seated or lying down, you hand should be at or above your heart. When walking, the hand needs to be at or above the level of your elbow.  If the bandage gets too tight, it may need to be loosened. Please contact our office and we will instruct you in how to do this.    SURGICAL BANDAGES:  Keep your dressing and/or splint clean and dry at all times.  Do not remove until you are seen again in the office.  If careful, you may place a plastic bag over your bandage and tape the end to shower, but be careful, do not get your bandages wet.     HAND THERAPY:  You may not need any. If you do, we will begin this at your follow up visit in the clinic.    ACTIVITY AND  WORK: You are encouraged to move any fingers which are not in the bandage.  Light use of the fingers is allowed to assist the other hand with daily hygiene and eating, but strong gripping or lifting is often uncomfortable and should be avoided.    [facility] [address] Floor, [address] [place], [place] [address] [phone]

## 2022-08-11 NOTE — Anesthesia Procedure Notes (Signed)
Procedure Name: LMA Insertion Date/Time: 08/11/2022 7:42 AM  Performed by: Glory Buff, CRNAPre-anesthesia Checklist: Patient identified, Emergency Drugs available, Suction available and Patient being monitored Patient Re-evaluated:Patient Re-evaluated prior to induction Oxygen Delivery Method: Circle system utilized Preoxygenation: Pre-oxygenation with 100% oxygen Induction Type: IV induction LMA: LMA inserted LMA Size: 4.0 Number of attempts: 1 Placement Confirmation: positive ETCO2 Tube secured with: Tape Dental Injury: Teeth and Oropharynx as per pre-operative assessment

## 2022-08-11 NOTE — Anesthesia Procedure Notes (Signed)
Anesthesia Regional Block: Supraclavicular block   Pre-Anesthetic Checklist: , timeout performed,  Correct Patient, Correct Site, Correct Laterality,  Correct Procedure, Correct Position, site marked,  Risks and benefits discussed,  Surgical consent,  Pre-op evaluation,  At surgeon's request and post-op pain management  Laterality: Right  Prep: chloraprep       Needles:  Injection technique: Single-shot  Needle Type: Echogenic Needle     Needle Length: 9cm  Needle Gauge: 21     Additional Needles:   Procedures:,,,, ultrasound used (permanent image in chart),,    Narrative:  Start time: 08/11/2022 7:03 AM End time: 08/11/2022 7:09 AM Injection made incrementally with aspirations every 5 mL.  Performed by: Personally  Anesthesiologist: Santa Lighter, MD  Additional Notes: No pain on injection. No increased resistance to injection. Injection made in 5cc increments.  Good needle visualization.  Patient tolerated procedure well.

## 2022-08-11 NOTE — Progress Notes (Signed)
Assisted Dr. Gifford Shave with right, supraclavicular, ultrasound guided block. Side rails up, monitors on throughout procedure. See vital signs in flow sheet. Tolerated Procedure well.

## 2022-08-11 NOTE — H&P (Signed)
HAND SURGERY   HPI: Brian Nielsen is a 61 y.o. male who presents with right ECU tenosynovitis with split thickness tear that has failed bracing, hand thearpy, oral NSAIDs, activity modification, and corticosteroid injection.  He continues to describe pain along the ulnar aspect of the wrist and the ECU tendon.  He denies any subjective tendon instability.  He denies any changes to his medical history or new systemic symptoms.   Past Medical History:  Diagnosis Date   Adult celiac disease    Anemia    BPH (benign prostatic hyperplasia)    CKD (chronic kidney disease)    DM (diabetes mellitus) (HCC)    Type II   GERD (gastroesophageal reflux disease)    HTN (hypertension)    Hyperlipidemia    Thoracic spondylosis without myelopathy 11/22/2016   Past Surgical History:  Procedure Laterality Date   BIOPSY N/A 09/07/2021   Procedure: BIOPSY;  Surgeon: Lucilla Lame, MD;  Location: [place];  Service: Endoscopy;  Laterality: N/A;   COLONOSCOPY  2003   Dr. Laural Golden: two small polyps, small ext hemorrhoids.path:focal adenomatous changes   COLONOSCOPY N/A 05/03/2014   Procedure: COLONOSCOPY;  Surgeon: Danie Binder, MD;  [facility];  Service: Endoscopy;  Laterality: N/A;  12:00   COLONOSCOPY WITH PROPOFOL N/A 09/07/2021   Procedure: COLONOSCOPY WITH PROPOFOL;  Surgeon: Lucilla Lame, MD;  Location: [facility];  Service: Endoscopy;  Laterality: N/A;   ESOPHAGOGASTRODUODENOSCOPY N/A 05/03/2014   Procedure: ESOPHAGOGASTRODUODENOSCOPY (EGD);  Surgeon: Danie Binder, MD;  [facility];  Service: Endoscopy;  Laterality: N/A;   ESOPHAGOGASTRODUODENOSCOPY (EGD) WITH PROPOFOL N/A 06/03/2020   Procedure: ESOPHAGOGASTRODUODENOSCOPY (EGD) WITH PROPOFOL;  Surgeon: [name], MD;  Location: [facility];  Service: Endoscopy;  Laterality: N/A;   ESOPHAGOGASTRODUODENOSCOPY (EGD) WITH PROPOFOL N/A 09/07/2021   Procedure: ESOPHAGOGASTRODUODENOSCOPY (EGD)  WITH PROPOFOL;  Surgeon: Lucilla Lame, MD;  Location: [facility];  Service: Endoscopy;  Laterality: N/A;  Diabetic   PARS PLANA VITRECTOMY Right 03/21/2021   Procedure: PARS PLANA VITRECTOMY 25 GAUGE WITH INJECTION OF ANTIBIOTICS FOR ENDOPHTHALMITIS;  Surgeon: Jalene Mullet, MD;  Location: [facility];  Service: Ophthalmology;  Laterality: Right;   POLYPECTOMY N/A 09/07/2021   Procedure: POLYPECTOMY;  Surgeon: Lucilla Lame, MD;  Location: [facility];  Service: Endoscopy;  Laterality: N/A;   Social History   Socioeconomic History   Marital status: Married    Spouse name: [name]   Number of children: 1   Years of education: 12   Highest education level: Not on file  Occupational History   Occupation: Programmer, systems: [employer]    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  Tobacco Use   Smoking status: Never   Smokeless tobacco: Current    Types: Chew   Tobacco comments:    Quit x 30 years  Vaping Use   Vaping Use: Never used  Substance and Sexual Activity   Alcohol use: Not Currently   Drug use: No   Sexual activity: Yes    Birth control/protection: Surgical  Other Topics Concern   Not on file  Social History Narrative   Lives with wife   [place]   Caffeine- diet Mtn Dew, 2 glasses   Social Determinants of Health   Financial Resource Strain: Not on file  Food Insecurity: Not on file  Transportation Needs: Not on file  Physical Activity: Not on file  Stress: Not on file  Social Connections: Not  on file   Family History  Problem Relation Age of Onset   Aneurysm Mother        brain   Hypertension Mother    Cancer Father    Heart disease Father    Diabetes Father    Hypertension Father    Hyperlipidemia Father    Cancer Maternal Grandmother        melanoma   Heart disease Paternal Grandfather    Colon cancer Neg Hx    - negative except otherwise stated in the family history section Allergies  Allergen Reactions    Duloxetine Other (See Comments)    "disoriented, confused, couldn't drive"   Gluten Meal     Celiac Disease   Codeine Rash   Prior to Admission medications   Medication Sig Start Date End Date Taking? Authorizing Provider  Ascorbic Acid (VITAMIN C) 1000 MG tablet Take 1,000 mg by mouth daily.   Yes [provider]  aspirin 81 MG EC tablet Take 81 mg by mouth daily.   Yes [provider]  atorvastatin (LIPITOR) 40 MG tablet Take 1 tablet (40 mg total) by mouth daily. Patient taking differently: Take 40 mg by mouth every evening. 10/21/16  Yes Raylene Everts, MD  B Complex Vitamins (B COMPLEX PO) Take 1 tablet by mouth daily.   Yes [provider]  Cholecalciferol (DIALYVITE VITAMIN D 5000) 125 MCG (5000 UT) capsule Take 5,000 Units by mouth daily.   Yes [provider]  Digestive Enzymes (MULTI-ENZYME) TABS Take 1 tablet by mouth daily.   Yes [provider]  fenofibrate 160 MG tablet Take 160 mg by mouth daily.   Yes [provider]  gabapentin (NEURONTIN) 300 MG capsule Take 900 mg by mouth 3 (three) times daily. 02/16/22  Yes [provider]  hydrochlorothiazide (HYDRODIURIL) 12.5 MG tablet Take 12.5 mg by mouth 2 (two) times daily. 05/19/21  Yes [provider]  ibuprofen (ADVIL) 200 MG tablet Take 200 mg by mouth every 6 (six) hours as needed.   Yes [provider]  JARDIANCE 25 MG TABS tablet Take 25 mg by mouth daily. 09/08/19  Yes [provider]  LANTUS SOLOSTAR 100 UNIT/ML Solostar Pen 50 Units daily. 02/15/22  Yes [provider]  lisinopril (ZESTRIL) 10 MG tablet Take 20 mg by mouth at bedtime. 09/29/21 09/29/22 Yes [provider]  MOUNJARO 7.5 MG/0.5ML Pen SMARTSIG:7.5 Milligram(s) SUB-Q Once a Week 06/20/22  Yes [provider]  Multiple Vitamin (MULTIVITAMIN WITH MINERALS) TABS tablet Take 1 tablet by mouth daily.   Yes [provider]  ofloxacin (OCUFLOX)  0.3 % ophthalmic solution Place 1-2 drops into both eyes daily as needed (eye injections). 05/22/21  Yes [provider]  Omega-3 Fatty Acids (FISH OIL PO) Take 1 tablet by mouth every other day.   Yes [provider]  pantoprazole (PROTONIX) 40 MG tablet Take 1 tablet (40 mg total) by mouth every evening. 05/03/22  Yes Lucilla Lame, MD  albuterol (VENTOLIN HFA) 108 (90 Base) MCG/ACT inhaler Inhale 1-2 puffs into the lungs every 6 (six) hours as needed for wheezing or shortness of breath. Patient not taking: Reported on 04/16/2022    [provider]  [place] Medication Name: Trimix--30/4/40 (PAP/PHE/PGE)   Inject 0.3-0.5 mL intracorporal as needed, not more than 3 times a week   Ten 1 mL vials, 5 refills 03/16/22   Franchot Gallo, MD  docusate sodium (COLACE) 100 MG capsule Take 200-300 mg by  mouth daily as needed for mild constipation. Patient not taking: Reported on 04/16/2022    [provider]   No results found. - Positive ROS: All other systems have been reviewed and were otherwise negative with the exception of those mentioned in the HPI and as above.  Physical Exam: General: No acute distress, resting comfortably Cardiovascular: BUE warm and well perfused, normal rate Respiratory: Normal WOB on RA Skin: Warm and dry Neurologic: Sensation intact distally Psychiatric: Patient is at baseline mood and affect  Right Upper Extremity  TTP directly over ulnar head w/ moderate swelling TTP along ECU tendon No frank ECU instability on exam SILT throughout hand Hand warm and well perfused   Assessment: 61 yo M presents w/ right ECU tenosynovitis that has failed conservative management  Plan: OR today for ECU tendon debridement with possible subsheath reconstruction.  Reviewed the risks of surgery which include bleeding, infection, damage to neurovascular structures, persistent symptoms, need for additional  surgery. Discharge to home from [place], M.D. [facility] 7:22 AM

## 2022-08-11 NOTE — Transfer of Care (Signed)
Immediate Anesthesia Transfer of Care Note  Patient: Brian Nielsen  Procedure(s) Performed: EXTENSOR CARPI ULNARIS TENDON DEBRIDEMENT, POSSIBLE SUBSHEATH RECONSTRUCTION (Right: Arm Lower)  Patient Location: PACU  Anesthesia Type:General  Level of Consciousness: drowsy, patient cooperative and responds to stimulation  Airway & Oxygen Therapy: Patient Spontanous Breathing and Patient connected to face mask oxygen  Post-op Assessment: Report given to RN and Post -op Vital signs reviewed and stable  Post vital signs: Reviewed and stable  Last Vitals:  Vitals Value Taken Time  BP    Temp    Pulse 61 08/11/22 0920  Resp    SpO2 100 % 08/11/22 0920  Vitals shown include unvalidated device data.  Last Pain:  Vitals:   08/11/22 0630  TempSrc: Oral  PainSc: 7       Patients Stated Pain Goal: 5 (02/54/27 0623)  Complications: No notable events documented.

## 2022-08-11 NOTE — Op Note (Signed)
Date of Surgery: 08/11/2022  INDICATIONS: Patient is a 61 y.o.-year-old male with significant right extensor carpi ulnar tendinosis that has failed conservative management including immobilization, activity modification, oral NSAID use, hand therapy, and corticosteroid injection.  Risks, benefits, and alternatives to surgery were again discussed with the patient in the preoperative area. The patient wishes to proceed with surgery.  Informed consent was signed after our discussion.   PREOPERATIVE DIAGNOSIS:  Right extensor carpi ulnaris tendinosis  POSTOPERATIVE DIAGNOSIS:  Right ECU tendinosis Right ECU instability  PROCEDURE:  Right ECU tenosynovectomy (34356) Right ECU subsheath reconstruction with extensor reticulum sling and dorsal tendon transfer (86168)   SURGEON: Audria Nine, M.D.  ASSIST:   ANESTHESIA:  Regional, general  IV FLUIDS AND URINE: See anesthesia.  ESTIMATED BLOOD LOSS: <5 mL.  IMPLANTS: * No implants in log *   DRAINS: None  COMPLICATIONS: None  DESCRIPTION OF PROCEDURE: The patient was met in the preoperative holding area where the surgical site was marked and the consent form was signed.  The patient was then taken to the operating room and transferred to the operating table.  All bony prominences were well padded.  A tourniquet was applied to the right upper arm.  General endotracheal anesthesia was induced.  The operative extremity was prepped and draped in the usual and sterile fashion.  A formal time-out was performed to confirm that this was the correct patient, surgery, side, and site.   Following formal timeout, the limb was exsanguinated with an Esmarch bandage and the tourniquet inflated 250 mmHg.  A longitudinal incision was made directly over the extensor carpi ulnaris tendon in the dorsal aspect of the wrist.  Blunt dissection was used to identify and protect any cutaneous nerves.  Small crossing vessels were coagulated bipolar cautery.  The  6 dorsal compartment was identified.  It was quite swollen and inflamed.  An incision was made at the ulnar aspect of the extensor retinaculum over the 6 compartment.  The extensor retinaculum was elevated taking care to protect the underlying ECU subsheath.  There was extensive tenosynovial tissue surrounding the ECU tendon with chalky white material embedded within the inflamed tissue.  The tenosynovial inflammatory tissue was sharply debrided from the tendon both proximal and distal to the area of the subsheath.  The tendon was quite frayed in both areas.  An Donne Hazel was placed around the ECU tendon and an attempt was made to thoroughly debride the tendon within the subsheath so as to avoid the need for subsheath reconstruction.  There was abundant tenosynovial tissue within the subsheath.  Following thorough debridement, tendon stability was assessed in pronation and supination.  The subsheath was found to be patulous and incompetent with significant subluxation of the tendon with forearm range of motion.  I was concerned that leaving the subsheath alone would result in symptomatic tendon instability and recurrence of the tenosynovitis.  I therefore decided to proceed with subcutaneous reconstruction using a slip of the extensor retinaculum.  The extensor retinaculum was elevated to the septum between the fifth and sixth compartments.  The subsheath was divided.  Was extensive tenosynovial inflammation within the subsheath.  Frayed edges of the tendon were debrided.  A 4-0 Mersilene suture was used to tubularized a portion of the tendon in the area of a split tear.  The tendon was then transferred to the dorsal aspect of the wrist and a 2 to 3 cm extensor retinaculum flap passed volar to the ECU tendon.  The retinacular flap was  then sutured to the dorsal extensor retinaculum using 4-0 Mersilene sutures, taking care that I did not capture the underlying tendon.  The form was gently taken through pronation and  supination and there was no evidence of tendon instability.  The portion of the extensor retinaculum that was not involved in this flap was then repaired anatomically as well as the underlying subsheath.  The wound was then thoroughly irrigated with copious sterile saline.  It was closed using 4-0 nylon sutures in a horizontal mattress fashion.  The tourniquet was let down hemostasis was achieved with direct pressure over the wound.  The fingers were warm, pink, and well-perfused with the tourniquet deflated.  The wound was dressed with Xeroform, folded Kerlix, cast padding, and a well-padded sugar-tong splint was applied with the forearm in slight pronation and in slight extension.  The patient was reversed from anesthesia and extubated uneventfully.  They were transferred from the operating table to the postoperative bed.  All counts were correct x 2 at the end of the procedure.  The patient was then taken to the PACU in stable condition.   POSTOPERATIVE PLAN: We will be discharged home with appropriate pain medication and discharge instructions.  I will see him back in the office in 10 to 14 days for suture removal.  At that time we will likely transition him to a long-arm cast with the forearm in slight pronation and wrist extension to protect the repair.  Audria Nine, MD 9:26 AM

## 2022-08-12 ENCOUNTER — Encounter (HOSPITAL_BASED_OUTPATIENT_CLINIC_OR_DEPARTMENT_OTHER): Payer: Self-pay | Admitting: Orthopedic Surgery

## 2022-08-24 ENCOUNTER — Ambulatory Visit: Payer: BC Managed Care – PPO | Admitting: Urology

## 2022-08-31 ENCOUNTER — Encounter: Payer: Self-pay | Admitting: Urology

## 2022-08-31 ENCOUNTER — Ambulatory Visit (INDEPENDENT_AMBULATORY_CARE_PROVIDER_SITE_OTHER): Payer: BC Managed Care – PPO | Admitting: Urology

## 2022-08-31 VITALS — BP 147/70 | HR 74

## 2022-08-31 DIAGNOSIS — N400 Enlarged prostate without lower urinary tract symptoms: Secondary | ICD-10-CM

## 2022-08-31 DIAGNOSIS — N401 Enlarged prostate with lower urinary tract symptoms: Secondary | ICD-10-CM

## 2022-08-31 DIAGNOSIS — N486 Induration penis plastica: Secondary | ICD-10-CM

## 2022-08-31 DIAGNOSIS — N5201 Erectile dysfunction due to arterial insufficiency: Secondary | ICD-10-CM

## 2022-08-31 DIAGNOSIS — N35010 Post-traumatic urethral stricture, male, meatal: Secondary | ICD-10-CM | POA: Diagnosis not present

## 2022-08-31 MED ORDER — CIPROFLOXACIN HCL 500 MG PO TABS
500.0000 mg | ORAL_TABLET | Freq: Once | ORAL | Status: AC
  Administered 2022-08-31: 500 mg via ORAL

## 2022-08-31 MED ORDER — ALFUZOSIN HCL ER 10 MG PO TB24: 10.0000 mg | ORAL_TABLET | Freq: Every day | ORAL | 3 refills | Status: DC

## 2022-08-31 NOTE — Progress Notes (Signed)
History of Present Illness: Here for follow-up of Peyronie's disease, erectile dysfunction and urethral stricture disease.  Unfortunately, even at high doses of Trimix, he is not had erections adequate for intercourse.  He is having worsening problems urinating.  Frequency, urgency, spraying of his stream.  He is not on alpha blockers.  There is a history of meatal stenosis.  Past Medical History:  Diagnosis Date   Adult celiac disease    Anemia    BPH (benign prostatic hyperplasia)    CKD (chronic kidney disease)    DM (diabetes mellitus) (HCC)    Type II   GERD (gastroesophageal reflux disease)    HTN (hypertension)    Hyperlipidemia    Thoracic spondylosis without myelopathy 11/22/2016    Past Surgical History:  Procedure Laterality Date   BIOPSY N/A 09/07/2021   Procedure: BIOPSY;  Surgeon: Lucilla Lame, MD;  Location: [place];  Service: Endoscopy;  Laterality: N/A;   COLONOSCOPY  2003   Dr. Laural Golden: two small polyps, small ext hemorrhoids.path:focal adenomatous changes   COLONOSCOPY N/A 05/03/2014   Procedure: COLONOSCOPY;  Surgeon: Danie Binder, MD;  [facility];  Service: Endoscopy;  Laterality: N/A;  12:00   COLONOSCOPY WITH PROPOFOL N/A 09/07/2021   Procedure: COLONOSCOPY WITH PROPOFOL;  Surgeon: Lucilla Lame, MD;  Location: [place];  Service: Endoscopy;  Laterality: N/A;   ESOPHAGOGASTRODUODENOSCOPY N/A 05/03/2014   Procedure: ESOPHAGOGASTRODUODENOSCOPY (EGD);  Surgeon: Danie Binder, MD;  [facility];  Service: Endoscopy;  Laterality: N/A;   ESOPHAGOGASTRODUODENOSCOPY (EGD) WITH PROPOFOL N/A 06/03/2020   Procedure: ESOPHAGOGASTRODUODENOSCOPY (EGD) WITH PROPOFOL;  Surgeon: [name], MD;  Location: [facility];  Service: Endoscopy;  Laterality: N/A;   ESOPHAGOGASTRODUODENOSCOPY (EGD) WITH PROPOFOL N/A 09/07/2021   Procedure: ESOPHAGOGASTRODUODENOSCOPY (EGD) WITH PROPOFOL;  Surgeon: Lucilla Lame, MD;   Location: [place];  Service: Endoscopy;  Laterality: N/A;  Diabetic   PARS PLANA VITRECTOMY Right 03/21/2021   Procedure: PARS PLANA VITRECTOMY 25 GAUGE WITH INJECTION OF ANTIBIOTICS FOR ENDOPHTHALMITIS;  Surgeon: Jalene Mullet, MD;  Location: [place];  Service: Ophthalmology;  Laterality: Right;   POLYPECTOMY N/A 09/07/2021   Procedure: POLYPECTOMY;  Surgeon: Lucilla Lame, MD;  Location: [place];  Service: Endoscopy;  Laterality: N/A;   REPAIR EXTENSOR TENDON Right 08/11/2022   Procedure: EXTENSOR CARPI ULNARIS TENDON DEBRIDEMENT, POSSIBLE SUBSHEATH RECONSTRUCTION;  Surgeon: Sherilyn Cooter, MD;  Location: [place];  Service: Orthopedics;  Laterality: Right;  or regional plus MAC    Home Medications:  Allergies as of 08/31/2022       Reactions   Duloxetine Other (See Comments)   "disoriented, confused, couldn't drive"   Gluten Meal    Celiac Disease   Codeine Rash        Medication List        Accurate as of August 31, 2022  8:54 AM. If you have any questions, ask your nurse or doctor.          albuterol 108 (90 Base) MCG/ACT inhaler Commonly known as: VENTOLIN HFA Inhale 1-2 puffs into the lungs every 6 (six) hours as needed for wheezing or shortness of breath.   AMBULATORY NON FORMULARY MEDICATION Medication Name: Trimix--30/4/40 (PAP/PHE/PGE)   Inject 0.3-0.5 mL intracorporal as needed, not more than 3 times a week   Ten 1 mL vials, 5 refills   aspirin EC 81 MG tablet Take 81 mg by mouth daily.   atorvastatin 40 MG tablet Commonly known as: LIPITOR  Take 1 tablet (40 mg total) by mouth daily. What changed: when to take this   B COMPLEX PO Take 1 tablet by mouth daily.   Dialyvite Vitamin D 5000 125 MCG (5000 UT) capsule Generic drug: Cholecalciferol Take 5,000 Units by mouth daily.   docusate sodium 100 MG capsule Commonly known as: COLACE Take 200-300 mg by mouth daily as needed for mild constipation.    fenofibrate 160 MG tablet Take 160 mg by mouth daily.   FISH OIL PO Take 1 tablet by mouth every other day.   gabapentin 300 MG capsule Commonly known as: NEURONTIN Take 900 mg by mouth 3 (three) times daily.   hydrochlorothiazide 12.5 MG tablet Commonly known as: HYDRODIURIL Take 12.5 mg by mouth 2 (two) times daily.   ibuprofen 200 MG tablet Commonly known as: ADVIL Take 200 mg by mouth every 6 (six) hours as needed.   Jardiance 25 MG Tabs tablet Generic drug: empagliflozin Take 25 mg by mouth daily.   Lantus SoloStar 100 UNIT/ML Solostar Pen Generic drug: insulin glargine 50 Units daily.   lisinopril 10 MG tablet Commonly known as: ZESTRIL Take 20 mg by mouth at bedtime.   Mounjaro 7.5 MG/0.5ML Pen Generic drug: tirzepatide SMARTSIG:7.5 Milligram(s) SUB-Q Once a Week   Multi-Enzyme Tabs Take 1 tablet by mouth daily.   multivitamin with minerals Tabs tablet Take 1 tablet by mouth daily.   ofloxacin 0.3 % ophthalmic solution Commonly known as: OCUFLOX Place 1-2 drops into both eyes daily as needed (eye injections).   pantoprazole 40 MG tablet Commonly known as: PROTONIX Take 1 tablet (40 mg total) by mouth every evening.   vitamin C 1000 MG tablet Take 1,000 mg by mouth daily.        Allergies:  Allergies  Allergen Reactions   Duloxetine Other (See Comments)    "disoriented, confused, couldn't drive"   Gluten Meal     Celiac Disease   Codeine Rash    Family History  Problem Relation Age of Onset   Aneurysm Mother        brain   Hypertension Mother    Cancer Father    Heart disease Father    Diabetes Father    Hypertension Father    Hyperlipidemia Father    Cancer Maternal Grandmother        melanoma   Heart disease Paternal Grandfather    Colon cancer Neg Hx     Social History:  reports that he has never smoked. His smokeless tobacco use includes chew. He reports that he does not currently use alcohol. He reports that he does not use  drugs.  ROS: A complete review of systems was performed.  All systems are negative except for pertinent findings as noted.  Physical Exam:  Vital signs in last 24 hours: There were no vitals taken for this visit. Constitutional:  Alert and oriented, No acute distress Cardiovascular: Regular rate  Respiratory: Normal respiratory effort Neurologic: Grossly intact, no focal deficits Psychiatric: Normal mood and affect  I have reviewed prior pt notes  I have reviewed notes from referring/previous physicians  I have reviewed urinalysis results  I have independently reviewed prior imaging--recent renal ultrasound images reviewed.  Normal renal appearance.  No significant bladder distention.  I have reviewed bladder scan results-47 mL  Cystoscopy Procedure Note:  Indication: Voiding symptoms, history of stricture  After informed consent and discussion of the procedure and its risks, TREVANTE TENNELL was positioned and prepped in the standard fashion.  Cystoscopy was performed with a flexible cystoscope.   Findings: Urethra: Meatus was stenotic.  Following dilation from 12-18 [place] with [facility] followers, the remaining urethra was normal, without stricture Prostate: Minimally obstructive Bladder neck: Open Ureteral orifices: Normal in configuration, location Bladder: No lesions, no trabeculation  The patient tolerated the procedure well.      Impression/Assessment:  1.  ED, resistant to medical treatment thus far  2.  Meatal stenosis with significant urinary symptoms  3.  BPH  Plan:  1.  I gave the patient a reusable 16 French dilator to dilate his urethral meatus once a week.  Instructions given how to cleanse and properly insert  2.  I did prescribe alfuzosin  3.  I will have him come back in 2 months for recheck of symptoms

## 2022-09-01 LAB — MICROSCOPIC EXAMINATION
Bacteria, UA: NONE SEEN
Epithelial Cells (non renal): NONE SEEN /hpf (ref 0–10)

## 2022-09-01 LAB — URINALYSIS, ROUTINE W REFLEX MICROSCOPIC
Bilirubin, UA: NEGATIVE
Ketones, UA: NEGATIVE
Leukocytes,UA: NEGATIVE
Nitrite, UA: NEGATIVE
Specific Gravity, UA: 1.015 (ref 1.005–1.030)
Urobilinogen, Ur: 0.2 mg/dL (ref 0.2–1.0)
pH, UA: 5 (ref 5.0–7.5)

## 2022-09-09 ENCOUNTER — Other Ambulatory Visit: Payer: Self-pay

## 2022-09-09 ENCOUNTER — Ambulatory Visit
Admission: EM | Admit: 2022-09-09 | Discharge: 2022-09-09 | Disposition: A | Discharge status: Home / Self Care | Payer: BC Managed Care – PPO | Attending: Nurse Practitioner | Admitting: Nurse Practitioner

## 2022-09-09 ENCOUNTER — Encounter: Payer: Self-pay | Admitting: Emergency Medicine

## 2022-09-09 DIAGNOSIS — Z1152 Encounter for screening for COVID-19: Secondary | ICD-10-CM | POA: Insufficient documentation

## 2022-09-09 DIAGNOSIS — J069 Acute upper respiratory infection, unspecified: Secondary | ICD-10-CM | POA: Diagnosis not present

## 2022-09-09 LAB — RESP PANEL BY RT-PCR (FLU A&B, COVID) ARPGX2
Influenza A by PCR: NEGATIVE
Influenza B by PCR: NEGATIVE
SARS Coronavirus 2 by RT PCR: NEGATIVE

## 2022-09-09 MED ORDER — BENZONATATE 100 MG PO CAPS: 100.0000 mg | ORAL_CAPSULE | Freq: Three times a day (TID) | ORAL | 0 refills | Status: DC | PRN

## 2022-09-09 NOTE — ED Triage Notes (Signed)
Onset 2 days ago started coughing, nagging cough.  Denies any phlegm, but frequently attempting to clear throat, reports matted eyes this morning, and stuffy nose.  Denies taking any medications for symptoms.   Patient has not done a covid test

## 2022-09-09 NOTE — Discharge Instructions (Signed)
You have a viral upper respiratory infection.  This should improve over the next week to 10 days.  If your symptoms last longer than 10 days without improvement, please follow up with your primary care provider.  If you develop chest pain or shortness of breath, please go to the Emergency Room.    We have tested you today for COVID-19 and influenza, you will see the results in Mychart and we will call you with positive results.    Please stay home and isolate until you are aware of the results.    Some things that can make you feel better are: - Increased rest - Increasing fluid with water/sugar free electrolytes - Acetaminophen and ibuprofen as needed for fever/pain  - Salt water gargling, chloraseptic spray and throat lozenges - OTC guaifenesin (Mucinex) 600 mg twice daily  - Saline sinus flushes or a neti pot  - Humidifying the air -Cough Perles as needed for dry cough

## 2022-09-09 NOTE — ED Provider Notes (Signed)
[facility]    CSN: [id number] Arrival date & time: 09/09/22  1101      History   Chief Complaint Chief Complaint  Patient presents with   Cough    HPI Brian Nielsen is a 61 y.o. male.   Patient presents for 2 days of dry cough, nasal congestion, runny nose, sneezing, sore throat, crust in his eyes when he wakes up, decreased appetite, loss of taste and smell, and fatigue.  He denies fever, shortness of breath, chest pain, chest congestion, postnasal drainage, headache, ear pain or drainage, abdominal pain, nausea/vomiting, diarrhea, and new rash on his skin.  No known sick contacts.  Reports he has never tested positive for COVID-19.  Reports he is fully vaccinated.  Has not taken anything for symptoms so far.  Reports he wants something to "prevent infection."  Patient denies chronic lung diagnoses; reports he smoked until age 41 and then quit.  Reports he sometimes does get an albuterol inhaler when he gets up.  Patient also reports a history of diabetes, anemia, hyperlipidemia.  Patient reports he has not taken blood pressure medication yet today.    Past Medical History:  Diagnosis Date   Adult celiac disease    Anemia    BPH (benign prostatic hyperplasia)    CKD (chronic kidney disease)    DM (diabetes mellitus) (HCC)    Type II   GERD (gastroesophageal reflux disease)    HTN (hypertension)    Hyperlipidemia    Thoracic spondylosis without myelopathy 11/22/2016    Patient Active Problem List   Diagnosis Date Noted   Extensor carpi ulnaris tendinitis 05/06/2022   Loss of weight    Polyp of ascending colon    Gastritis without bleeding    Symptomatic anemia 05/28/2021   Thoracic spondylosis without myelopathy 11/22/2016   Vitamin D deficiency 11/22/2016   HLD (hyperlipidemia) 10/19/2016   BPH (benign prostatic hyperplasia) 10/19/2016   Essential hypertension 10/19/2016   Diabetes mellitus, insulin dependent (IDDM), controlled 10/19/2016   Celiac  sprue 05/23/2014   Anemia, iron deficiency 04/19/2014   GERD (gastroesophageal reflux disease) 04/19/2014    Past Surgical History:  Procedure Laterality Date   BIOPSY N/A 09/07/2021   Procedure: BIOPSY;  Surgeon: Lucilla Lame, MD;  Location: [place];  Service: Endoscopy;  Laterality: N/A;   COLONOSCOPY  2003   Dr. Laural Golden: two small polyps, small ext hemorrhoids.path:focal adenomatous changes   COLONOSCOPY N/A 05/03/2014   Procedure: COLONOSCOPY;  Surgeon: Danie Binder, MD;  [facility];  Service: Endoscopy;  Laterality: N/A;  12:00   COLONOSCOPY WITH PROPOFOL N/A 09/07/2021   Procedure: COLONOSCOPY WITH PROPOFOL;  Surgeon: Lucilla Lame, MD;  Location: [place];  Service: Endoscopy;  Laterality: N/A;   ESOPHAGOGASTRODUODENOSCOPY N/A 05/03/2014   Procedure: ESOPHAGOGASTRODUODENOSCOPY (EGD);  Surgeon: Danie Binder, MD;  [facility];  Service: Endoscopy;  Laterality: N/A;   ESOPHAGOGASTRODUODENOSCOPY (EGD) WITH PROPOFOL N/A 06/03/2020   Procedure: ESOPHAGOGASTRODUODENOSCOPY (EGD) WITH PROPOFOL;  Surgeon: [name], MD;  Location: [facility];  Service: Endoscopy;  Laterality: N/A;   ESOPHAGOGASTRODUODENOSCOPY (EGD) WITH PROPOFOL N/A 09/07/2021   Procedure: ESOPHAGOGASTRODUODENOSCOPY (EGD) WITH PROPOFOL;  Surgeon: Lucilla Lame, MD;  Location: [place];  Service: Endoscopy;  Laterality: N/A;  Diabetic   PARS PLANA VITRECTOMY Right 03/21/2021   Procedure: PARS PLANA VITRECTOMY 25 GAUGE WITH INJECTION OF ANTIBIOTICS FOR ENDOPHTHALMITIS;  Surgeon: Jalene Mullet, MD;  Location: [place];  Service: Ophthalmology;  Laterality: Right;  POLYPECTOMY N/A 09/07/2021   Procedure: POLYPECTOMY;  Surgeon: Lucilla Lame, MD;  Location: [place];  Service: Endoscopy;  Laterality: N/A;   REPAIR EXTENSOR TENDON Right 08/11/2022   Procedure: EXTENSOR CARPI ULNARIS TENDON DEBRIDEMENT, POSSIBLE SUBSHEATH RECONSTRUCTION;  Surgeon: Sherilyn Cooter, MD;  Location: [place];  Service: Orthopedics;  Laterality: Right;  or regional plus MAC       Home Medications    Prior to Admission medications   Medication Sig Start Date End Date Taking? Authorizing Provider  benzonatate (TESSALON) 100 MG capsule Take 1 capsule (100 mg total) by mouth 3 (three) times daily as needed for cough. Do not take with alcohol or while driving or operating heavy machinery.  May cause drowsiness. 09/09/22  Yes Eulogio Bear, NP  albuterol (VENTOLIN HFA) 108 (90 Base) MCG/ACT inhaler Inhale 1-2 puffs into the lungs every 6 (six) hours as needed for wheezing or shortness of breath. Patient not taking: Reported on 04/16/2022    [provider]  alfuzosin (UROXATRAL) 10 MG 24 hr tablet Take 1 tablet (10 mg total) by mouth daily. 08/31/22   Franchot Gallo, MD  AMBULATORY NON FORMULARY MEDICATION Medication Name: Trimix--30/4/40 (PAP/PHE/PGE)   Inject 0.3-0.5 mL intracorporal as needed, not more than 3 times a week   Ten 1 mL vials, 5 refills 03/16/22   Franchot Gallo, MD  Ascorbic Acid (VITAMIN C) 1000 MG tablet Take 1,000 mg by mouth daily.    [provider]  aspirin 81 MG EC tablet Take 81 mg by mouth daily.    [provider]  atorvastatin (LIPITOR) 40 MG tablet Take 1 tablet (40 mg total) by mouth daily. Patient taking differently: Take 40 mg by mouth every evening. 10/21/16   Raylene Everts, MD  B Complex Vitamins (B COMPLEX PO) Take 1 tablet by mouth daily.    [provider]  Cholecalciferol (DIALYVITE VITAMIN D 5000) 125 MCG (5000 UT) capsule Take 5,000 Units by mouth daily.    [provider]  Digestive Enzymes (MULTI-ENZYME) TABS Take 1 tablet by mouth daily.    [provider]  docusate sodium (COLACE) 100 MG capsule Take 200-300 mg by mouth daily as needed for mild constipation. Patient not taking: Reported on 04/16/2022    [provider]  fenofibrate  160 MG tablet Take 160 mg by mouth daily.    [provider]  gabapentin (NEURONTIN) 300 MG capsule Take 900 mg by mouth 3 (three) times daily. 02/16/22   [provider]  hydrochlorothiazide (HYDRODIURIL) 12.5 MG tablet Take 12.5 mg by mouth 2 (two) times daily. 05/19/21   [provider]  ibuprofen (ADVIL) 200 MG tablet Take 200 mg by mouth every 6 (six) hours as needed.    [provider]  JARDIANCE 25 MG TABS tablet Take 25 mg by mouth daily. 09/08/19   [provider]  LANTUS SOLOSTAR 100 UNIT/ML Solostar Pen 50 Units daily. 02/15/22   [provider]  lisinopril (ZESTRIL) 10 MG tablet Take 20 mg by mouth at bedtime. 09/29/21 09/29/22  [provider]  lisinopril (ZESTRIL) 20 MG tablet Take 20 mg by mouth daily. 08/23/22   [provider]  MOUNJARO 7.5 MG/0.5ML Pen SMARTSIG:7.5 Milligram(s) SUB-Q Once a Week 06/20/22   [provider]  Multiple Vitamin (MULTIVITAMIN WITH MINERALS) TABS tablet Take 1 tablet by mouth daily.    [provider]  ofloxacin (OCUFLOX) 0.3 % ophthalmic solution Place 1-2 drops into both eyes daily as  needed (eye injections). 05/22/21   [provider]  Omega-3 Fatty Acids (FISH OIL PO) Take 1 tablet by mouth every other day.    [provider]  pantoprazole (PROTONIX) 40 MG tablet Take 1 tablet (40 mg total) by mouth every evening. 05/03/22   Lucilla Lame, MD    Family History Family History  Problem Relation Age of Onset   Aneurysm Mother        brain   Hypertension Mother    Cancer Father    Heart disease Father    Diabetes Father    Hypertension Father    Hyperlipidemia Father    Cancer Maternal Grandmother        melanoma   Heart disease Paternal Grandfather    Colon cancer Neg Hx     Social History Social History   Tobacco Use   Smoking status: Never   Smokeless tobacco: Current    Types: Chew   Tobacco comments:    Quit x 30 years  Vaping Use    Vaping Use: Never used  Substance Use Topics   Alcohol use: Not Currently   Drug use: No     Allergies   Duloxetine, Gluten meal, and Codeine   Review of Systems Review of Systems Per HPI  Physical Exam Triage Vital Signs ED Triage Vitals  Enc Vitals Group     BP 09/09/22 1214 (!) 151/79     Pulse Rate 09/09/22 1214 68     Resp 09/09/22 1214 20     Temp 09/09/22 1214 98.5 F (36.9 C)     Temp Source 09/09/22 1214 Oral     SpO2 09/09/22 1214 97 %     Weight --      Height --      Head Circumference --      Peak Flow --      Pain Score 09/09/22 1211 4     Pain Loc --      Pain Edu? --      Excl. in [place]? --    No data found.  Updated Vital Signs BP (!) 151/79 (BP Location: Left Arm)   Pulse 68   Temp 98.5 F (36.9 C) (Oral)   Resp 20   SpO2 97%   Visual Acuity Right Eye Distance:   Left Eye Distance:   Bilateral Distance:    Right Eye Near:   Left Eye Near:    Bilateral Near:     Physical Exam Vitals and nursing note reviewed.  Constitutional:      General: He is not in acute distress.    Appearance: Normal appearance. He is not ill-appearing or toxic-appearing.  HENT:     Head: Normocephalic and atraumatic.     Right Ear: Tympanic membrane, ear canal and external ear normal.     Left Ear: Tympanic membrane, ear canal and external ear normal.     Nose: Congestion and rhinorrhea present.     Mouth/Throat:     Mouth: Mucous membranes are moist.     Pharynx: Oropharynx is clear. Posterior oropharyngeal erythema present. No oropharyngeal exudate.     Tonsils: 0 on the right. 0 on the left.  Eyes:     General: No scleral icterus.    Extraocular Movements: Extraocular movements intact.  Cardiovascular:     Rate and Rhythm: Normal rate and regular rhythm.  Pulmonary:     Effort: Pulmonary effort is normal. No respiratory distress.     Breath sounds: Normal breath sounds.  No wheezing, rhonchi or rales.  Abdominal:     Palpations: Abdomen is soft.   Musculoskeletal:     Cervical back: Normal range of motion and neck supple.  Lymphadenopathy:     Cervical: No cervical adenopathy.  Skin:    General: Skin is warm and dry.     Coloration: Skin is not jaundiced or pale.     Findings: No erythema or rash.  Neurological:     Mental Status: He is alert and oriented to person, place, and time.  Psychiatric:        Behavior: Behavior is cooperative.      UC Treatments / Results  Labs (all labs ordered are listed, but only abnormal results are displayed) Labs Reviewed  RESP PANEL BY RT-PCR (FLU A&B, COVID) [id number]    EKG   Radiology No results found.  Procedures Procedures (including critical care time)  Medications Ordered in UC Medications - No data to display  Initial Impression / Assessment and Plan / UC Course  I have reviewed the triage vital signs and the nursing notes.  Pertinent labs & imaging results that were available during my care of the patient were reviewed by me and considered in my medical decision making (see chart for details).   Patient is well-appearing, normotensive, afebrile, not tachycardic, not tachypneic, oxygenating well on room air.    Encounter for screening for COVID-19 Viral URI with cough COVID-19, influenza testing obtained; I am suspicious for COVID-19 and he would qualify for molnupiravir if he test positive Supportive care discussed Start cough suppressant for dry cough Discussed with patient at length that antibiotics do not prevent infection and antibiotic is not indicated today Discussed signs and symptoms of pneumonia and when to return to clinic ER and return precautions discussed  The patient was given the opportunity to ask questions.  All questions answered to their satisfaction.  The patient is in agreement to this plan.    Final Clinical Impressions(s) / UC Diagnoses   Final diagnoses:  Encounter for screening for COVID-19  Viral URI with cough     Discharge  Instructions      You have a viral upper respiratory infection.  This should improve over the next week to 10 days.  If your symptoms last longer than 10 days without improvement, please follow up with your primary care provider.  If you develop chest pain or shortness of breath, please go to the Emergency Room.    We have tested you today for COVID-19 and influenza, you will see the results in Mychart and we will call you with positive results.    Please stay home and isolate until you are aware of the results.    Some things that can make you feel better are: - Increased rest - Increasing fluid with water/sugar free electrolytes - Acetaminophen and ibuprofen as needed for fever/pain  - Salt water gargling, chloraseptic spray and throat lozenges - OTC guaifenesin (Mucinex) 600 mg twice daily  - Saline sinus flushes or a neti pot  - Humidifying the air -Cough Perles as needed for dry cough      ED Prescriptions     Medication Sig Dispense Auth. Provider   benzonatate (TESSALON) 100 MG capsule Take 1 capsule (100 mg total) by mouth 3 (three) times daily as needed for cough. Do not take with alcohol or while driving or operating heavy machinery.  May cause drowsiness. 21 capsule Eulogio Bear, NP      PDMP not  reviewed this encounter.   Eulogio Bear, NP 09/09/22 1240

## 2022-09-25 ENCOUNTER — Other Ambulatory Visit: Payer: Self-pay | Admitting: Gastroenterology

## 2022-09-28 ENCOUNTER — Ambulatory Visit (INDEPENDENT_AMBULATORY_CARE_PROVIDER_SITE_OTHER): Payer: BC Managed Care – PPO | Admitting: Gastroenterology

## 2022-09-28 ENCOUNTER — Encounter: Payer: Self-pay | Admitting: Gastroenterology

## 2022-09-28 VITALS — BP 122/74 | HR 71 | Temp 98.2°F | Wt 200.0 lb

## 2022-09-28 DIAGNOSIS — K219 Gastro-esophageal reflux disease without esophagitis: Secondary | ICD-10-CM | POA: Diagnosis not present

## 2022-09-28 MED ORDER — FAMOTIDINE 20 MG PO TABS: 20.0000 mg | ORAL_TABLET | Freq: Two times a day (BID) | ORAL | 3 refills | Status: DC

## 2022-09-28 NOTE — Progress Notes (Signed)
Primary Care Physician: Dion Body, MD  Primary Gastroenterologist:  Dr. Lucilla Lame  Chief Complaint  Patient presents with   Follow-up    GERD---doing well on Pantoprazole daily   Medication Refill    HPI: Brian Nielsen is a 61 y.o. male here for refill of his medication.  The patient has been on pantoprazole and states that it is working well.  The patient did inform me that he has been diagnosed with kidney disease and his most recent creatinine is 2.0.  The patient denies any black stools or bloody stools.  He also denies any fevers chills nausea or vomiting.  The patient is out on disability since he recently had surgery on his right arm.  Past Medical History:  Diagnosis Date   Adult celiac disease    Anemia    BPH (benign prostatic hyperplasia)    CKD (chronic kidney disease)    DM (diabetes mellitus) (HCC)    Type II   GERD (gastroesophageal reflux disease)    HTN (hypertension)    Hyperlipidemia    Thoracic spondylosis without myelopathy 11/22/2016    Current Outpatient Medications  Medication Sig Dispense Refill   albuterol (VENTOLIN HFA) 108 (90 Base) MCG/ACT inhaler Inhale 1-2 puffs into the lungs every 6 (six) hours as needed for wheezing or shortness of breath. (Patient not taking: Reported on 04/16/2022)     alfuzosin (UROXATRAL) 10 MG 24 hr tablet Take 1 tablet (10 mg total) by mouth daily. 90 tablet 3   AMBULATORY NON FORMULARY MEDICATION Medication Name: Trimix--30/4/40 (PAP/PHE/PGE)   Inject 0.3-0.5 mL intracorporal as needed, not more than 3 times a week   Ten 1 mL vials, 5 refills 10 vial 5   Ascorbic Acid (VITAMIN C) 1000 MG tablet Take 1,000 mg by mouth daily.     aspirin 81 MG EC tablet Take 81 mg by mouth daily.     atorvastatin (LIPITOR) 40 MG tablet Take 1 tablet (40 mg total) by mouth daily. (Patient taking differently: Take 40 mg by mouth every evening.) 90 tablet 3   B Complex Vitamins (B COMPLEX PO) Take 1 tablet by mouth daily.      benzonatate (TESSALON) 100 MG capsule Take 1 capsule (100 mg total) by mouth 3 (three) times daily as needed for cough. Do not take with alcohol or while driving or operating heavy machinery.  May cause drowsiness. 21 capsule 0   Cholecalciferol (DIALYVITE VITAMIN D 5000) 125 MCG (5000 UT) capsule Take 5,000 Units by mouth daily.     Digestive Enzymes (MULTI-ENZYME) TABS Take 1 tablet by mouth daily.     docusate sodium (COLACE) 100 MG capsule Take 200-300 mg by mouth daily as needed for mild constipation. (Patient not taking: Reported on 04/16/2022)     fenofibrate 160 MG tablet Take 160 mg by mouth daily.     gabapentin (NEURONTIN) 300 MG capsule Take 900 mg by mouth 3 (three) times daily.     hydrochlorothiazide (HYDRODIURIL) 12.5 MG tablet Take 12.5 mg by mouth 2 (two) times daily.     ibuprofen (ADVIL) 200 MG tablet Take 200 mg by mouth every 6 (six) hours as needed.     JARDIANCE 25 MG TABS tablet Take 25 mg by mouth daily.     LANTUS SOLOSTAR 100 UNIT/ML Solostar Pen 50 Units daily.     lisinopril (ZESTRIL) 10 MG tablet Take 20 mg by mouth at bedtime.     lisinopril (ZESTRIL) 20 MG tablet Take 20 mg  by mouth daily.     MOUNJARO 7.5 MG/0.5ML Pen SMARTSIG:7.5 Milligram(s) SUB-Q Once a Week     Multiple Vitamin (MULTIVITAMIN WITH MINERALS) TABS tablet Take 1 tablet by mouth daily.     ofloxacin (OCUFLOX) 0.3 % ophthalmic solution Place 1-2 drops into both eyes daily as needed (eye injections).     Omega-3 Fatty Acids (FISH OIL PO) Take 1 tablet by mouth every other day.     pantoprazole (PROTONIX) 40 MG tablet TAKE 1 TABLET BY MOUTH IN THE  EVENING 90 tablet 3   No current facility-administered medications for this visit.    Allergies as of 09/28/2022 - Review Complete 09/09/2022  Allergen Reaction Noted   Duloxetine Other (See Comments) 12/19/2019   Gluten meal  08/19/2021   Codeine Rash 03/21/2021    ROS:  General: Negative for anorexia, weight loss, fever, chills, fatigue,  weakness. ENT: Negative for hoarseness, difficulty swallowing , nasal congestion. CV: Negative for chest pain, angina, palpitations, dyspnea on exertion, peripheral edema.  Respiratory: Negative for dyspnea at rest, dyspnea on exertion, cough, sputum, wheezing.  GI: See history of present illness. GU:  Negative for dysuria, hematuria, urinary incontinence, urinary frequency, nocturnal urination.  Endo: Negative for unusual weight change.    Physical Examination:   BP 122/74 (BP Location: Left Arm, Patient Position: Sitting, Cuff Size: Normal)   Pulse 71   Temp 98.2 F (36.8 C) (Oral)   Wt 200 lb (90.7 kg)   BMI 30.41 kg/m   General: Well-nourished, well-developed in no acute distress.  Eyes: No icterus. Conjunctivae pink. Neuro: Alert and oriented x 3.  Grossly intact. Skin: Warm and dry, no jaundice.   Psych: Alert and cooperative, normal mood and affect.  Labs:    Imaging Studies: No results found.  Assessment and Plan:   Brian Nielsen is a 61 y.o. y/o male who comes in today with a history of GERD with a report of being helped with pantoprazole.  The patient has informed me that he has been having renal issues with an increased creatinine.  The patient has bee told that PPIs have been associated with worsening renal failure and that he should stop taking that.  The patient has also been told that he can start Pepcid 20 mg twice a day and consider antireflux surgery.  The patient states that he would like to try the medication and hold off on any surgical intervention at this time.  The patient will contact me if he decides to undergo a antireflux surgery.  He is also due for repeat EGD in October of next year due to his finding of gastric intestinal metaplasia.  The patient has been explained the plan agrees with it.     Lucilla Lame, MD. Marval Regal    Note: This dictation was prepared with Dragon dictation along with smaller phrase technology. Any transcriptional errors that result  from this process are unintentional.

## 2022-09-29 ENCOUNTER — Telehealth: Payer: Self-pay

## 2022-09-29 DIAGNOSIS — K219 Gastro-esophageal reflux disease without esophagitis: Secondary | ICD-10-CM

## 2022-09-29 NOTE — Telephone Encounter (Signed)
Pt called stating that he would like to proceed with the referral for antireflux surgery.Marland KitchenMarland Kitchen

## 2022-09-30 ENCOUNTER — Encounter: Payer: Self-pay | Admitting: Internal Medicine

## 2022-09-30 NOTE — Addendum Note (Signed)
Addended by: Lurlean Nanny on: 09/30/2022 10:34 AM   Modules accepted: Orders

## 2022-10-04 ENCOUNTER — Encounter: Payer: Self-pay | Admitting: Surgery

## 2022-10-04 ENCOUNTER — Ambulatory Visit (INDEPENDENT_AMBULATORY_CARE_PROVIDER_SITE_OTHER): Payer: BC Managed Care – PPO | Admitting: Surgery

## 2022-10-04 ENCOUNTER — Encounter: Payer: Self-pay | Admitting: Internal Medicine

## 2022-10-04 VITALS — BP 186/80 | HR 64 | Temp 98.0°F | Ht 68.0 in | Wt 199.0 lb

## 2022-10-04 DIAGNOSIS — N189 Chronic kidney disease, unspecified: Secondary | ICD-10-CM | POA: Diagnosis not present

## 2022-10-04 DIAGNOSIS — E1022 Type 1 diabetes mellitus with diabetic chronic kidney disease: Secondary | ICD-10-CM | POA: Diagnosis not present

## 2022-10-04 DIAGNOSIS — I129 Hypertensive chronic kidney disease with stage 1 through stage 4 chronic kidney disease, or unspecified chronic kidney disease: Secondary | ICD-10-CM

## 2022-10-04 DIAGNOSIS — K449 Diaphragmatic hernia without obstruction or gangrene: Secondary | ICD-10-CM | POA: Diagnosis not present

## 2022-10-04 NOTE — Patient Instructions (Addendum)
CT Abdomen/Pelvis scheduled 10/15/22 @ 1:15 pm . Nothing to eat/drink 4 hours prior.    Barium swallow study scheduled 10/25/22 @ 9:30 am. Nothing to eat/drink 3 hours prior.     Gastric Emptying Study scheduled 11/04/22 . No stomach medications or narcotics 6 hours prior. Nothing to eat/drink 6 hours prior.   We will contact you the end of December to schedule an appointment for January 2024.

## 2022-10-05 ENCOUNTER — Encounter: Payer: Self-pay | Admitting: Surgery

## 2022-10-05 NOTE — Progress Notes (Signed)
Patient ID: Brian Nielsen, male   DOB: 25-Jan-1961, 61 y.o.   MRN: [id number]  HPI Brian Nielsen is a 61 y.o. male seen in consultation at the request of Dr.Wohl for recalcitrant reflux he does have a history of diabetes, hypertension chronic kidney disease with a baseline creatinine of 2.0.  He has been taking PPIs and he is adamant that he wishes to stop that.  He is interested in antireflux surgery to be completely off PPIs.  He states that PPIs are currently working for him.  He states that he has heartburn worsening when he lays down.  He also feels full.  He does have a history of diabetes and also celiac disease. He did have an EGD last year showing evidence of some intestinal metaplasia. Also had a CT scan of the abdomen and pelvis a couple years ago that I have personally reviewed showing no acute intra-abdominal pathology. Patient reports some distention and flatulence.  He also endorses intermittent abdominal discomfort and pain that is colicky type Hemoglobin A1c is 7.9, CMP was normal except for creatinine of 2.0 CBC shows chronic anemia with a hemoglobin of 11.6, platelets HPI  Past Medical History:  Diagnosis Date   Adult celiac disease    Anemia    BPH (benign prostatic hyperplasia)    CKD (chronic kidney disease)    DM (diabetes mellitus) (HCC)    Type II   GERD (gastroesophageal reflux disease)    HTN (hypertension)    Hyperlipidemia    Thoracic spondylosis without myelopathy 11/22/2016    Past Surgical History:  Procedure Laterality Date   BIOPSY N/A 09/07/2021   Procedure: BIOPSY;  Surgeon: Lucilla Lame, MD;  Location: [facility];  Service: Endoscopy;  Laterality: N/A;   COLONOSCOPY  2003   Dr. Laural Golden: two small polyps, small ext hemorrhoids.path:focal adenomatous changes   COLONOSCOPY N/A 05/03/2014   Procedure: COLONOSCOPY;  Surgeon: Danie Binder, MD;  [facility];  Service: Endoscopy;  Laterality: N/A;  12:00   COLONOSCOPY WITH PROPOFOL N/A  09/07/2021   Procedure: COLONOSCOPY WITH PROPOFOL;  Surgeon: Lucilla Lame, MD;  Location: [place];  Service: Endoscopy;  Laterality: N/A;   ESOPHAGOGASTRODUODENOSCOPY N/A 05/03/2014   Procedure: ESOPHAGOGASTRODUODENOSCOPY (EGD);  Surgeon: Danie Binder, MD;  [facility];  Service: Endoscopy;  Laterality: N/A;   ESOPHAGOGASTRODUODENOSCOPY (EGD) WITH PROPOFOL N/A 06/03/2020   Procedure: ESOPHAGOGASTRODUODENOSCOPY (EGD) WITH PROPOFOL;  Surgeon: [name], MD;  Location: [facility];  Service: Endoscopy;  Laterality: N/A;   ESOPHAGOGASTRODUODENOSCOPY (EGD) WITH PROPOFOL N/A 09/07/2021   Procedure: ESOPHAGOGASTRODUODENOSCOPY (EGD) WITH PROPOFOL;  Surgeon: Lucilla Lame, MD;  Location: [place];  Service: Endoscopy;  Laterality: N/A;  Diabetic   PARS PLANA VITRECTOMY Right 03/21/2021   Procedure: PARS PLANA VITRECTOMY 25 GAUGE WITH INJECTION OF ANTIBIOTICS FOR ENDOPHTHALMITIS;  Surgeon: Jalene Mullet, MD;  Location: [place];  Service: Ophthalmology;  Laterality: Right;   POLYPECTOMY N/A 09/07/2021   Procedure: POLYPECTOMY;  Surgeon: Lucilla Lame, MD;  Location: [place];  Service: Endoscopy;  Laterality: N/A;   REPAIR EXTENSOR TENDON Right 08/11/2022   Procedure: EXTENSOR CARPI ULNARIS TENDON DEBRIDEMENT, POSSIBLE SUBSHEATH RECONSTRUCTION;  Surgeon: Sherilyn Cooter, MD;  Location: [place];  Service: Orthopedics;  Laterality: Right;  or regional plus MAC    Family History  Problem Relation Age of Onset   Aneurysm Mother        brain   Hypertension Mother    Cancer Father  Heart disease Father    Diabetes Father    Hypertension Father    Hyperlipidemia Father    Cancer Maternal Grandmother        melanoma   Heart disease Paternal Grandfather    Colon cancer Neg Hx     Social History Social History   Tobacco Use   Smoking status: Never   Smokeless tobacco: Current    Types: Chew   Tobacco comments:    Quit x 30  years  Vaping Use   Vaping Use: Never used  Substance Use Topics   Alcohol use: Not Currently   Drug use: No    Allergies  Allergen Reactions   Duloxetine Other (See Comments)    "disoriented, confused, couldn't drive"   Gluten Meal     Celiac Disease   Codeine Rash    Current Outpatient Medications  Medication Sig Dispense Refill   alfuzosin (UROXATRAL) 10 MG 24 hr tablet Take 1 tablet (10 mg total) by mouth daily. 90 tablet 3   Ascorbic Acid (VITAMIN C) 1000 MG tablet Take 1,000 mg by mouth daily.     aspirin 81 MG EC tablet Take 81 mg by mouth daily.     atorvastatin (LIPITOR) 40 MG tablet Take 1 tablet (40 mg total) by mouth daily. (Patient taking differently: Take 40 mg by mouth every evening.) 90 tablet 3   B Complex Vitamins (B COMPLEX PO) Take 1 tablet by mouth daily.     Cholecalciferol (DIALYVITE VITAMIN D 5000) 125 MCG (5000 UT) capsule Take 5,000 Units by mouth daily.     Digestive Enzymes (MULTI-ENZYME) TABS Take 1 tablet by mouth daily.     famotidine (PEPCID) 20 MG tablet Take 1 tablet (20 mg total) by mouth 2 (two) times daily. 180 tablet 3   fenofibrate 160 MG tablet Take 160 mg by mouth daily.     gabapentin (NEURONTIN) 300 MG capsule Take 900 mg by mouth 3 (three) times daily.     JARDIANCE 25 MG TABS tablet Take 25 mg by mouth daily.     LANTUS SOLOSTAR 100 UNIT/ML Solostar Pen 50 Units daily.     lisinopril (ZESTRIL) 20 MG tablet Take 20 mg by mouth daily.     MOUNJARO 10 MG/0.5ML Pen Inject 10 mg into the skin once a week.     Multiple Vitamin (MULTIVITAMIN WITH MINERALS) TABS tablet Take 1 tablet by mouth daily.     Omega-3 Fatty Acids (FISH OIL PO) Take 1 tablet by mouth every other day.     No current facility-administered medications for this visit.     Review of Systems Full ROS  was asked and was negative except for the information on the HPI  Physical Exam Blood pressure (!) 186/80, pulse 64, temperature 98 F (36.7 C), height _0  (1.727 m),  weight 199 lb (90.3 kg), SpO2 98 %. CONSTITUTIONAL: Chronically ill. EYES: Pupils are equal, round, and reactive to light, Sclera are non-icteric. EARS, NOSE, MOUTH AND THROAT: The oropharynx is clear. The oral mucosa is pink and moist. Hearing is intact to voice. LYMPH NODES:  Lymph nodes in the neck are normal. RESPIRATORY:  Lungs are clear. There is normal respiratory effort, with equal breath sounds bilaterally, and without pathologic use of accessory muscles. CARDIOVASCULAR: Heart is regular without murmurs, gallops, or rubs. GI: The abdomen is  soft, nontender, and nondistended. There are no palpable masses. There is no hepatosplenomegaly. There are normal bowel sounds in all quadrants. GU: Rectal deferred.  MUSCULOSKELETAL: Normal muscle strength and tone. No cyanosis or edema.   SKIN: Turgor is good and there are no pathologic skin lesions or ulcers. NEUROLOGIC: Motor and sensation is grossly normal. Cranial nerves are grossly intact. PSYCH:  Oriented to person, place and time. Affect is normal.  Data Reviewed  I have personally reviewed the patient's imaging, laboratory findings and medical records.    Assessment/Plan  61 year old male with history of chronic renal insufficiency and recalcitrant reflux interested in stopping PPI or antireflux medications.  Gust with the patient in detail and the family about his disease process.  He seems to have some chronic functional GI issues and I was very candid with him that usually GI functional issues do not respond to a Nissen fundoplication.  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 More over since he is diabetic I would like to obtain gastric emptying study prior to any potential procedures.  He will need a CT scan of the abdomen pelvis as well has barium swallow to evaluate intra-abdominal mediastinal anatomy.   Please note that I spent 60 minutes in  this encounter including personally reviewing imaging studies, coordinating her care, placing orders and performing appropriate  documentation. A copy of this report was sent to the referring provider  Caroleen Hamman, MD FACS General Surgeon 10/05/2022, 2:22 PM

## 2022-10-11 ENCOUNTER — Ambulatory Visit: Payer: BC Managed Care – PPO | Admitting: Internal Medicine

## 2022-10-11 ENCOUNTER — Other Ambulatory Visit: Payer: BC Managed Care – PPO

## 2022-10-11 ENCOUNTER — Ambulatory Visit: Payer: BC Managed Care – PPO

## 2022-10-12 ENCOUNTER — Inpatient Hospital Stay (HOSPITAL_BASED_OUTPATIENT_CLINIC_OR_DEPARTMENT_OTHER): Payer: BC Managed Care – PPO | Admitting: Internal Medicine

## 2022-10-12 ENCOUNTER — Ambulatory Visit: Payer: BC Managed Care – PPO | Admitting: Internal Medicine

## 2022-10-12 ENCOUNTER — Other Ambulatory Visit: Payer: BC Managed Care – PPO

## 2022-10-12 ENCOUNTER — Ambulatory Visit: Payer: BC Managed Care – PPO

## 2022-10-12 ENCOUNTER — Inpatient Hospital Stay: Payer: BC Managed Care – PPO | Attending: Internal Medicine

## 2022-10-12 ENCOUNTER — Inpatient Hospital Stay: Payer: BC Managed Care – PPO

## 2022-10-12 ENCOUNTER — Encounter: Payer: Self-pay | Admitting: Internal Medicine

## 2022-10-12 VITALS — BP 181/81 | HR 69 | Temp 98.7°F | Resp 19 | Wt 206.0 lb

## 2022-10-12 VITALS — BP 189/81 | HR 71 | Resp 16

## 2022-10-12 DIAGNOSIS — D649 Anemia, unspecified: Secondary | ICD-10-CM

## 2022-10-12 DIAGNOSIS — D509 Iron deficiency anemia, unspecified: Secondary | ICD-10-CM

## 2022-10-12 LAB — IRON AND TIBC
Iron: 97 ug/dL (ref 45–182)
Saturation Ratios: 27 % (ref 17.9–39.5)
TIBC: 358 ug/dL (ref 250–450)
UIBC: 261 ug/dL

## 2022-10-12 LAB — BASIC METABOLIC PANEL
Anion gap: 7 (ref 5–15)
BUN: 30 mg/dL — ABNORMAL HIGH (ref 8–23)
CO2: 21 mmol/L — ABNORMAL LOW (ref 22–32)
Calcium: 8.8 mg/dL — ABNORMAL LOW (ref 8.9–10.3)
Chloride: 109 mmol/L (ref 98–111)
Creatinine, Ser: 1.67 mg/dL — ABNORMAL HIGH (ref 0.61–1.24)
GFR, Estimated: 46 mL/min — ABNORMAL LOW (ref >60)
Glucose, Bld: 268 mg/dL — ABNORMAL HIGH (ref 70–99)
Potassium: 4 mmol/L (ref 3.5–5.1)
Sodium: 137 mmol/L (ref 135–145)

## 2022-10-12 LAB — CBC
HCT: 35.7 % — ABNORMAL LOW (ref 39.0–52.0)
Hemoglobin: 11 g/dL — ABNORMAL LOW (ref 13.0–17.0)
MCH: 27.4 pg (ref 26.0–34.0)
MCHC: 30.8 g/dL (ref 30.0–36.0)
MCV: 89 fL (ref 80.0–100.0)
Platelets: 233 10*3/uL (ref 150–400)
RBC: 4.01 MIL/uL — ABNORMAL LOW (ref 4.22–5.81)
RDW: 14.1 % (ref 11.5–15.5)
WBC: 7.4 10*3/uL (ref 4.0–10.5)
nRBC: 0 % (ref 0.0–0.2)

## 2022-10-12 LAB — FERRITIN: Ferritin: 239 ng/mL (ref 24–336)

## 2022-10-12 MED ORDER — SODIUM CHLORIDE 0.9 % IV SOLN
200.0000 mg | Freq: Once | INTRAVENOUS | Status: AC
  Administered 2022-10-12: 200 mg via INTRAVENOUS
  Filled 2022-10-12: qty 200

## 2022-10-12 MED ORDER — SODIUM CHLORIDE 0.9 % IV SOLN
Freq: Once | INTRAVENOUS | Status: AC
  Filled 2022-10-12: qty 250

## 2022-10-12 NOTE — Progress Notes (Signed)
[place] CONSULT NOTE  Patient Care Team: Dion Body, MD as PCP - General (Family Medicine) [name], NP (Inactive) as Nurse Practitioner (Endocrinology) Cammie Sickle, MD as Consulting Physician (Oncology)  CHIEF COMPLAINTS/PURPOSE OF CONSULTATION: ANEMIA   HEMATOLOGY HISTORY:  # ANEMIA PROGRESSIVE since 2020 12; 2022- 10 MCV- 90s; colonoscopy 2015; multiple endoscopies-last September 2021-biopsy active celiac disease   #CKD stage III [[facility]]/diabetes [[facility]]; ? Celaic disease [[facility]];    - Labs: 07/2014 - tTG IgA 101 03/2020 - positive endomysial Ab IgA, tTG IgA 78 - CSY: 07/2014 - normal appearing terminal ileum, sigmoid diverticulosis, and non-bleeding internal hemorrhoids - EGD: 07/2014 - mild non-erosive gastritis, normal appearing duodenal mucosa with duodenal biopsies showing celiac sprue - EGD: 06/03/2020 - procedure aborted due to presence of food - EGD: 08/12/2020 - irregular Z-line found at [facility] with bx showing mild reflux esophagitis, 1 cm hiatal hernia, mild chronic gastritis, decreased folds found in entire duodenum, flattening found in entire duodenum and scalloped mucosa with bx showing active Celiac disease with villous atrophy, increased intraepithelial lymphocytes, etc. Repeat in 1-year.  HISTORY OF PRESENTING ILLNESS: Alone.  Ambulating independently.  Brian Nielsen 61 y.o.  male symptomatic anemia-is here for follow-up.  Currently s/p surgery of right wrist currently on disability.   Patient continues to complains of chronic fatigue.  Chronic GI complaints-of reflux.  Currently being followed by GI.    No fever no chills. Otherwise no nausea no vomiting.   Review of Systems  Constitutional:  Positive for malaise/fatigue and weight loss. Negative for chills, diaphoresis and fever.  HENT:  Negative for nosebleeds and sore throat.   Eyes:  Negative for double vision.  Respiratory:  Positive for shortness  of breath. Negative for cough, hemoptysis, sputum production and wheezing.   Cardiovascular:  Negative for chest pain, palpitations, orthopnea and leg swelling.  Gastrointestinal:  Negative for blood in stool, heartburn, melena, nausea and vomiting.  Genitourinary:  Negative for dysuria, frequency and urgency.  Musculoskeletal:  Positive for back pain and joint pain.  Skin: Negative.  Negative for itching and rash.  Neurological:  Positive for weakness. Negative for dizziness, tingling, focal weakness and headaches.  Endo/Heme/Allergies:  Does not bruise/bleed easily.  Psychiatric/Behavioral:  Negative for depression. The patient is not nervous/anxious and does not have insomnia.     MEDICAL HISTORY:  Past Medical History:  Diagnosis Date   Adult celiac disease    Anemia    BPH (benign prostatic hyperplasia)    CKD (chronic kidney disease)    DM (diabetes mellitus) (HCC)    Type II   GERD (gastroesophageal reflux disease)    HTN (hypertension)    Hyperlipidemia    Thoracic spondylosis without myelopathy 11/22/2016    SURGICAL HISTORY: Past Surgical History:  Procedure Laterality Date   BIOPSY N/A 09/07/2021   Procedure: BIOPSY;  Surgeon: Lucilla Lame, MD;  Location: [facility];  Service: Endoscopy;  Laterality: N/A;   COLONOSCOPY  2003   Dr. Laural Golden: two small polyps, small ext hemorrhoids.path:focal adenomatous changes   COLONOSCOPY N/A 05/03/2014   Procedure: COLONOSCOPY;  Surgeon: Danie Binder, MD;  [facility];  Service: Endoscopy;  Laterality: N/A;  12:00   COLONOSCOPY WITH PROPOFOL N/A 09/07/2021   Procedure: COLONOSCOPY WITH PROPOFOL;  Surgeon: Lucilla Lame, MD;  Location: [facility];  Service: Endoscopy;  Laterality: N/A;   ESOPHAGOGASTRODUODENOSCOPY N/A 05/03/2014   Procedure: ESOPHAGOGASTRODUODENOSCOPY (EGD);  Surgeon: Danie Binder, MD;  [facility];  Service: Endoscopy;  Laterality: N/A;   ESOPHAGOGASTRODUODENOSCOPY (EGD)  WITH PROPOFOL N/A 06/03/2020   Procedure: ESOPHAGOGASTRODUODENOSCOPY (EGD) WITH PROPOFOL;  Surgeon: [name], MD;  Location: [facility];  Service: Endoscopy;  Laterality: N/A;   ESOPHAGOGASTRODUODENOSCOPY (EGD) WITH PROPOFOL N/A 09/07/2021   Procedure: ESOPHAGOGASTRODUODENOSCOPY (EGD) WITH PROPOFOL;  Surgeon: Lucilla Lame, MD;  Location: [facility];  Service: Endoscopy;  Laterality: N/A;  Diabetic   PARS PLANA VITRECTOMY Right 03/21/2021   Procedure: PARS PLANA VITRECTOMY 25 GAUGE WITH INJECTION OF ANTIBIOTICS FOR ENDOPHTHALMITIS;  Surgeon: Jalene Mullet, MD;  Location: [facility];  Service: Ophthalmology;  Laterality: Right;   POLYPECTOMY N/A 09/07/2021   Procedure: POLYPECTOMY;  Surgeon: Lucilla Lame, MD;  Location: [place];  Service: Endoscopy;  Laterality: N/A;   REPAIR EXTENSOR TENDON Right 08/11/2022   Procedure: EXTENSOR CARPI ULNARIS TENDON DEBRIDEMENT, POSSIBLE SUBSHEATH RECONSTRUCTION;  Surgeon: Sherilyn Cooter, MD;  Location: [place];  Service: Orthopedics;  Laterality: Right;  or regional plus MAC    SOCIAL HISTORY: Social History   Socioeconomic History   Marital status: Married    Spouse name: [name]   Number of children: 1   Years of education: 12   Highest education level: Not on file  Occupational History   Occupation: Programmer, systems: [employer]    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  Tobacco Use   Smoking status: Never   Smokeless tobacco: Current    Types: Chew   Tobacco comments:    Quit x 30 years  Vaping Use   Vaping Use: Never used  Substance and Sexual Activity   Alcohol use: Not Currently   Drug use: No   Sexual activity: Yes    Birth control/protection: Surgical  Other Topics Concern   Not on file  Social History Narrative   Lives with wife   [place]   Caffeine- diet Mtn Dew, 2 glasses   Social Determinants of Health   Financial Resource Strain: Not on file   Food Insecurity: Not on file  Transportation Needs: Not on file  Physical Activity: Not on file  Stress: Not on file  Social Connections: Not on file  Intimate Partner Violence: Not on file    FAMILY HISTORY: Family History  Problem Relation Age of Onset   Aneurysm Mother        brain   Hypertension Mother    Cancer Father    Heart disease Father    Diabetes Father    Hypertension Father    Hyperlipidemia Father    Cancer Maternal Grandmother        melanoma   Heart disease Paternal Grandfather    Colon cancer Neg Hx     ALLERGIES:  is allergic to duloxetine, gluten meal, and codeine.  MEDICATIONS:  Current Outpatient Medications  Medication Sig Dispense Refill   alfuzosin (UROXATRAL) 10 MG 24 hr tablet Take 1 tablet (10 mg total) by mouth daily. 90 tablet 3   Ascorbic Acid (VITAMIN C) 1000 MG tablet Take 1,000 mg by mouth daily.     aspirin 81 MG EC tablet Take 81 mg by mouth daily.     atorvastatin (LIPITOR) 40 MG tablet Take 1 tablet (40 mg total) by mouth daily. (Patient taking differently: Take 40 mg by mouth every evening.) 90 tablet 3   B Complex Vitamins (B COMPLEX PO) Take 1 tablet by mouth daily.     Cholecalciferol (DIALYVITE VITAMIN D 5000) 125  MCG (5000 UT) capsule Take 5,000 Units by mouth daily.     Digestive Enzymes (MULTI-ENZYME) TABS Take 1 tablet by mouth daily.     famotidine (PEPCID) 20 MG tablet Take 1 tablet (20 mg total) by mouth 2 (two) times daily. 180 tablet 3   fenofibrate 160 MG tablet Take 160 mg by mouth daily.     gabapentin (NEURONTIN) 300 MG capsule Take 900 mg by mouth 3 (three) times daily.     JARDIANCE 25 MG TABS tablet Take 25 mg by mouth daily.     LANTUS SOLOSTAR 100 UNIT/ML Solostar Pen 50 Units daily.     lisinopril (ZESTRIL) 20 MG tablet Take 20 mg by mouth daily.     MOUNJARO 10 MG/0.5ML Pen Inject 10 mg into the skin once a week.     Multiple Vitamin (MULTIVITAMIN WITH MINERALS) TABS tablet Take 1 tablet by mouth daily.      Omega-3 Fatty Acids (FISH OIL PO) Take 1 tablet by mouth every other day.     No current facility-administered medications for this visit.      PHYSICAL EXAMINATION:   Vitals:   10/12/22 1312  BP: (!) 181/81  Pulse: 69  Resp: 19  Temp: 98.7 F (37.1 C)  SpO2: 99%   Filed Weights   10/12/22 1312  Weight: 206 lb (93.4 kg)    Physical Exam Vitals and nursing note reviewed.  Constitutional:      Comments:    HENT:     Head: Normocephalic and atraumatic.     Mouth/Throat:     Pharynx: Oropharynx is clear.  Eyes:     Extraocular Movements: Extraocular movements intact.     Pupils: Pupils are equal, round, and reactive to light.  Cardiovascular:     Rate and Rhythm: Normal rate and regular rhythm.  Pulmonary:     Comments: Decreased breath sounds bilaterally.  Abdominal:     Palpations: Abdomen is soft.  Musculoskeletal:        General: Normal range of motion.     Cervical back: Normal range of motion.  Skin:    General: Skin is warm.  Neurological:     General: No focal deficit present.     Mental Status: He is alert and oriented to person, place, and time.  Psychiatric:        Behavior: Behavior normal.        Judgment: Judgment normal.     LABORATORY DATA:  I have reviewed the data as listed Lab Results  Component Value Date   WBC 7.4 10/12/2022   HGB 11.0 (L) 10/12/2022   HCT 35.7 (L) 10/12/2022   MCV 89.0 10/12/2022   PLT 233 10/12/2022   Recent Labs    12/09/21 1545 01/13/22 1246 07/12/22 1423 08/09/22 1027 10/12/22 1211  NA 136   < > 137 142 137  K 3.7   < > 3.9 4.4 4.0  CL 109   < > 111 114* 109  CO2 19*   < > 20* 22 21*  GLUCOSE 355*   < > 220* 257* 268*  BUN 37*   < > 35* 28* 30*  CREATININE 2.20*   < > 1.97* 2.35* 1.67*  CALCIUM 8.2*  8.5*   < > 8.7* 8.4* 8.8*  GFRNONAA 33*   < > 38* 31* 46*  ALBUMIN 3.6  --   --   --   --    < > = values in this interval not displayed.  No results found.  Symptomatic  anemia #Anemia-symptomatic fatigue hemoglobin 9-10-unclear etiology; however suspect multifactorial-iron malabsorption/celiac disease/CKD-stage III.    # Current Hb- 11.6; proceed with IV venofer today.  Discussed that he will need likely maintenance IV iron every few months.    # Ckd stage III [GFR]-diabetes- GFR-41.   # Diabtes [[facility] Hb A1c- 6.2]-PBG 1525-continue follow-up with endocrinology/monitoring of blood sugars.  # ?  History of celiac disease-defer to [facility]  # DISPOSITION: # Venofer today # follow up in 3 months- MD; labs- cbc/bmp;iron studies;ferritin-;possible venofer -Dr.B       All questions were answered. The patient knows to call the clinic with any problems, questions or concerns.      Cammie Sickle, MD 10/12/2022 2:04 PM

## 2022-10-12 NOTE — Progress Notes (Signed)
Patient has no concerns at the moment.

## 2022-10-12 NOTE — Assessment & Plan Note (Addendum)
#  Anemia-symptomatic fatigue hemoglobin 9-10-unclear etiology; however suspect multifactorial-iron malabsorption/celiac disease/CKD-stage III.    # Current Hb- 11.0; proceed with IV venofer today.  Discussed that he will need likely maintenance IV iron every few months.  Also discussed re: epo injections unless hb is less than 10 consistently. Also discussed re: risk of strokes with epo agents.   # Ckd stage III [GFR]-diabetes- GFR-43- STABLE.   # Diabetes [[facility] Hb A1c- 6.2]-PBG 263-continue follow-up with endocrinology/monitoring of blood sugars.STABLE.   # ?Reflux-? PPI s reflux surgery-  History of celiac disease-defer to [facility]  # DISPOSITION: # Venofer today # follow up in 3 months- MD; labs- cbc/bmp;iron studies;ferritin-;possible venofer -Dr.B

## 2022-10-12 NOTE — Patient Instructions (Signed)

## 2022-10-15 ENCOUNTER — Ambulatory Visit
Admission: RE | Admit: 2022-10-15 | Discharge: 2022-10-15 | Disposition: A | Discharge status: Home / Self Care | Payer: BC Managed Care – PPO | Source: Ambulatory Visit | Attending: Surgery | Admitting: Surgery

## 2022-10-15 DIAGNOSIS — K449 Diaphragmatic hernia without obstruction or gangrene: Secondary | ICD-10-CM

## 2022-10-15 MED ORDER — IOHEXOL 300 MG/ML  SOLN
80.0000 mL | Freq: Once | INTRAMUSCULAR | Status: AC | PRN
  Administered 2022-10-15: 80 mL via INTRAVENOUS

## 2022-10-25 ENCOUNTER — Ambulatory Visit
Admission: RE | Admit: 2022-10-25 | Discharge: 2022-10-25 | Disposition: A | Discharge status: Home / Self Care | Payer: BC Managed Care – PPO | Source: Ambulatory Visit | Attending: Surgery | Admitting: Surgery

## 2022-10-25 DIAGNOSIS — K449 Diaphragmatic hernia without obstruction or gangrene: Secondary | ICD-10-CM | POA: Insufficient documentation

## 2022-10-26 ENCOUNTER — Telehealth: Payer: Self-pay

## 2022-10-26 ENCOUNTER — Telehealth: Payer: Self-pay | Admitting: Gastroenterology

## 2022-10-26 ENCOUNTER — Other Ambulatory Visit: Payer: Self-pay

## 2022-10-26 DIAGNOSIS — N401 Enlarged prostate with lower urinary tract symptoms: Secondary | ICD-10-CM

## 2022-10-26 MED ORDER — ALFUZOSIN HCL ER 10 MG PO TB24: 10.0000 mg | ORAL_TABLET | Freq: Every day | ORAL | 3 refills | Status: DC

## 2022-10-26 NOTE — Telephone Encounter (Signed)
Pharmacy updated and rx sent

## 2022-10-26 NOTE — Telephone Encounter (Signed)
Per pt pepcid doesn't work at all . Would like to try  prevacid. He has had it before and it  seems to work better.  [place]

## 2022-10-26 NOTE — Telephone Encounter (Signed)
Patient needing a 90 day supply, due to lower cost. alfuzosin (UROXATRAL) 10 MG 24 hr tablet   Pharmacy:    [place], [place] Phone: [phone]  Fax: [phone]     Thanks, Helene Kelp

## 2022-10-26 NOTE — Progress Notes (Signed)
Pt requested 90 day supply of uroxatral be sent to [facility].  Rx updated and sent.

## 2022-10-27 MED ORDER — LANSOPRAZOLE 30 MG PO CPDR: 30.0000 mg | DELAYED_RELEASE_CAPSULE | Freq: Every day | ORAL | 2 refills | Status: DC

## 2022-10-27 NOTE — Telephone Encounter (Signed)
Left message on voicemail  I need to confirm the dose of Prevacid and confirm pharmacy to send to

## 2022-10-27 NOTE — Addendum Note (Signed)
Addended by: Lindalou Hose Y on: 10/27/2022 04:00 PM   Modules accepted: Orders

## 2022-10-29 ENCOUNTER — Encounter
Admission: RE | Admit: 2022-10-29 | Discharge: 2022-10-29 | Disposition: A | Discharge status: Home / Self Care | Payer: BC Managed Care – PPO | Source: Ambulatory Visit | Attending: Surgery | Admitting: Surgery

## 2022-10-29 DIAGNOSIS — K449 Diaphragmatic hernia without obstruction or gangrene: Secondary | ICD-10-CM | POA: Diagnosis present

## 2022-10-29 IMAGING — MR MR WRIST*R* WO/W CM
10 of 11 series · 38 of 40 positions shown · IV contrast (9 ml gadavist)
Comparison: None Available.

CLINICAL DATA: Palpable mass in the right wrist.

EXAM:
MR OF THE RIGHT WRIST WITHOUT AND WITH CONTRAST
TECHNIQUE: Multiplanar multisequence MR imaging of the right wrist was
performed both before and after the administration of intravenous
contrast.
CONTRAST:  9mL GADAVIST GADOBUTROL 1 MMOL/ML IV SOLN

[Series 1014: T1 · oblique · right · 4.0mm · 0.26mm/px · 4 of 34 slices shown (1 of 2)]
[im 1/34]
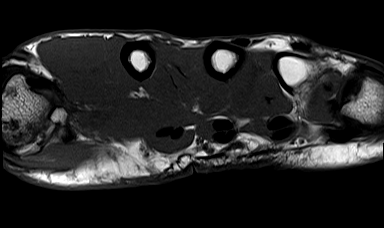
[im 12/34]
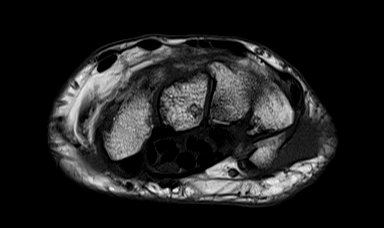
[im 23/34]
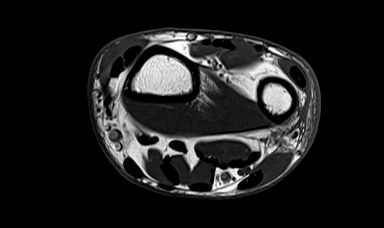
[im 34/34]
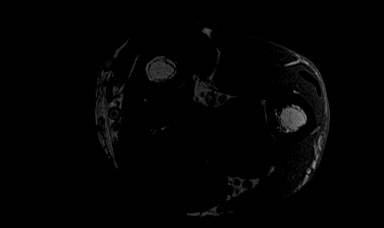

[Series 1016: T2 fat-sat · oblique · right · 4.0mm · 0.13mm/px · 4 of 34 slices shown (1 of 2)]
[im 1/34]
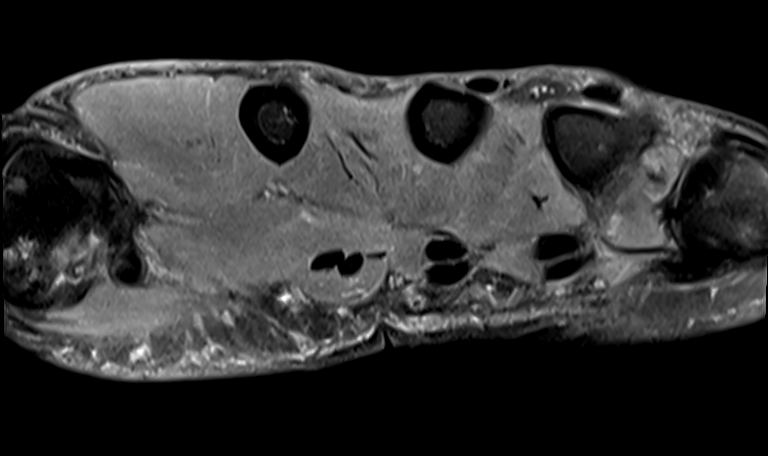
[im 12/34]
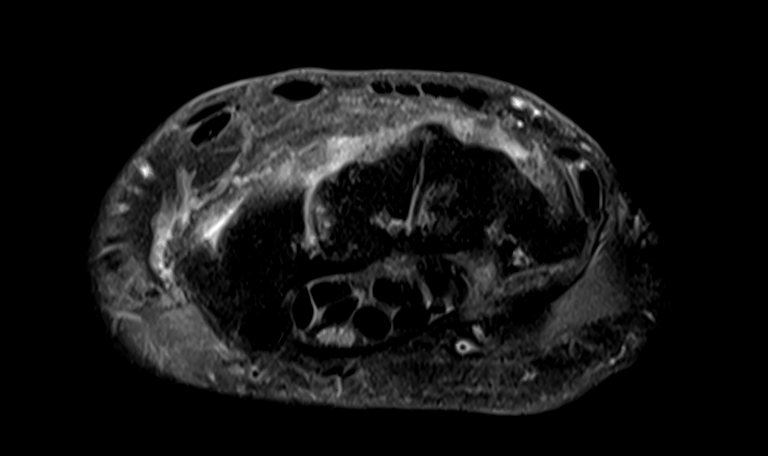
[im 23/34]
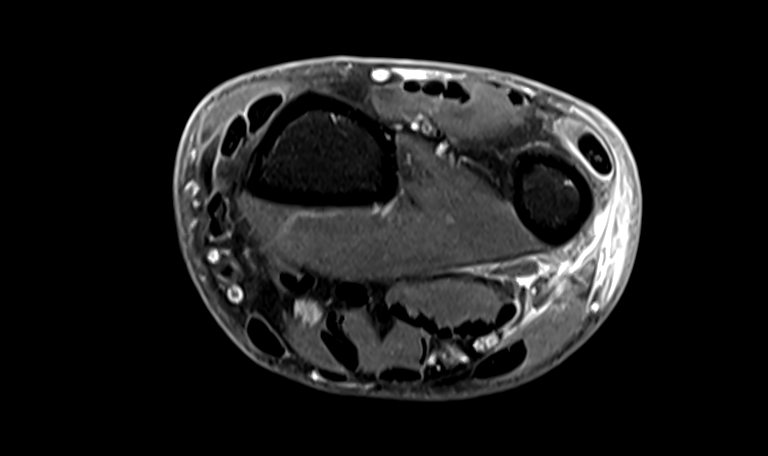
[im 34/34]
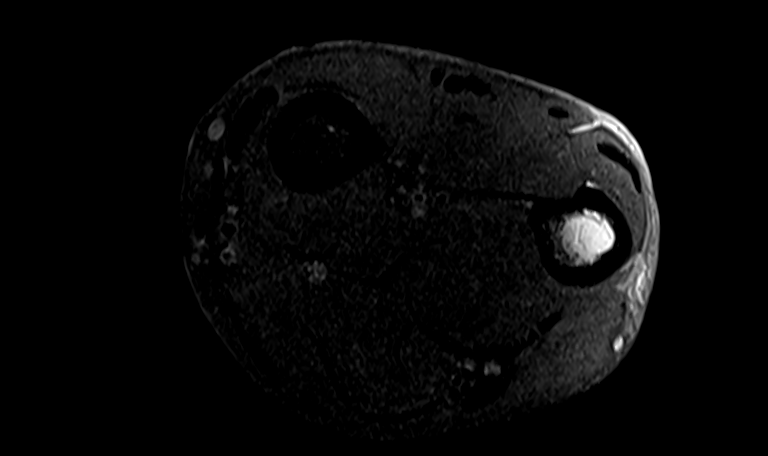

[Series 1018: T1 · coronal · right · 3.0mm · 0.28mm/px · 2 of 16 slices shown (2 of 2)]
[im 1/16]
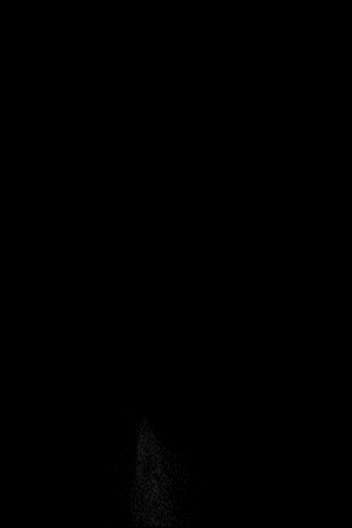
[im 16/16]
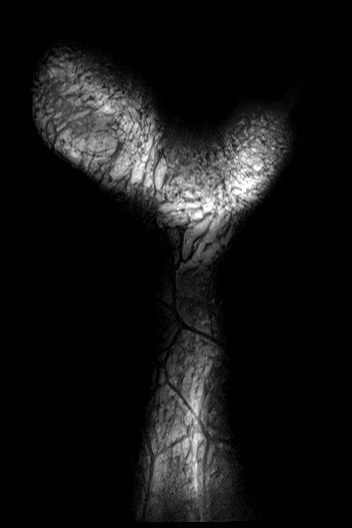

[Series 1020: T2 fat-sat · coronal · right · 3.0mm · 0.28mm/px · 2 of 16 slices shown (2 of 2)]
[im 1/16]
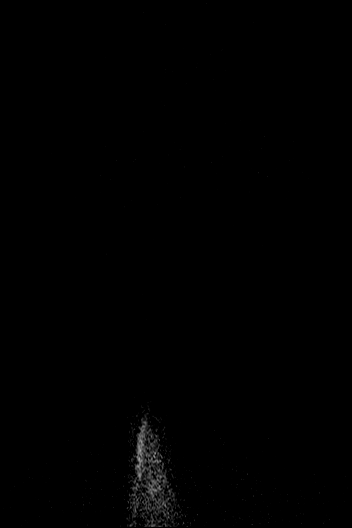
[im 16/16]
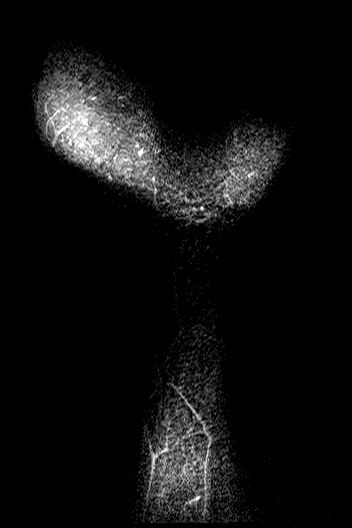

[Series 1025: PD fat-sat · coronal · right · 3.0mm · 0.28mm/px · 2 of 16 slices shown (1 of 2)]
[im 1/16]
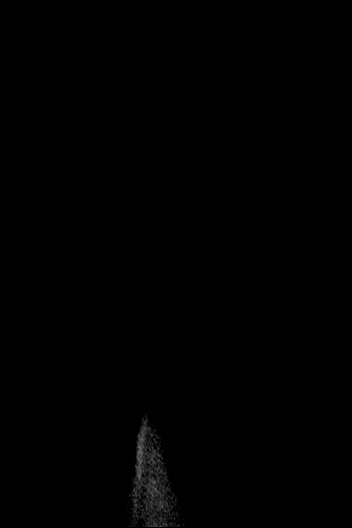
[im 16/16]
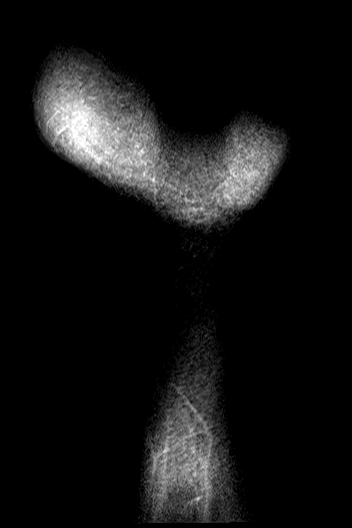

[Series 1027: PD fat-sat · oblique · right · 3.0mm · 0.39mm/px · 4 of 28 slices shown (2 of 2)]
[im 1/28]
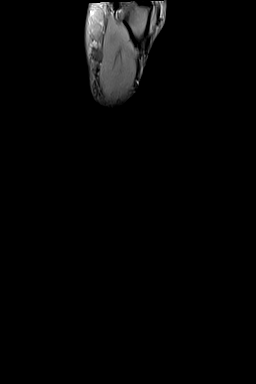
[im 10/28]
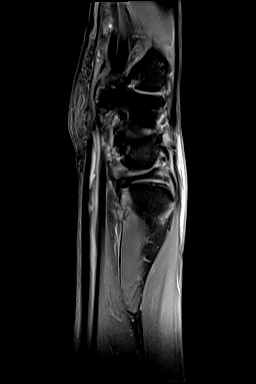
[im 19/28]
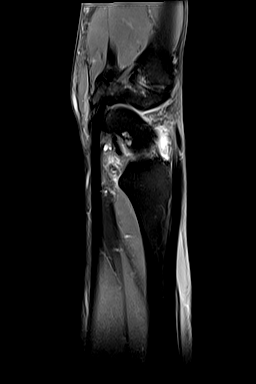
[im 28/28]
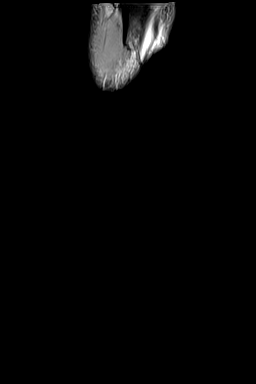

[Series 1029: T1 fat-sat · oblique · right · 4.0mm · 0.39mm/px · 5 of 34 slices shown (1 of 3)]
[im 1/34]
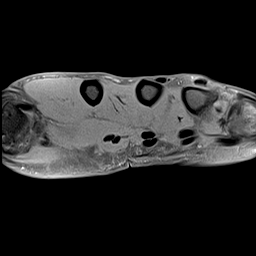
[im 9/34]
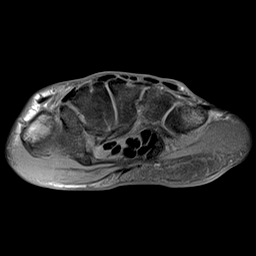
[im 17/34]
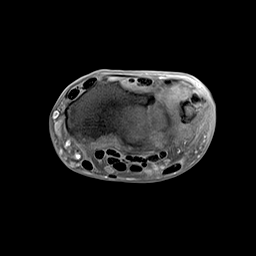
[im 25/34]
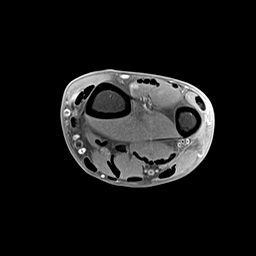
[im 34/34]
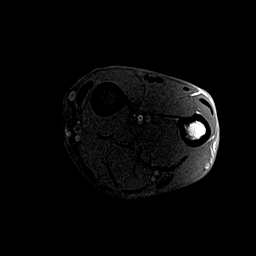

[Series 1031: T1 fat-sat · oblique · non-contrast · right · 4.0mm · 0.39mm/px · 5 of 34 slices shown (2 of 3)]
[im 1/34]
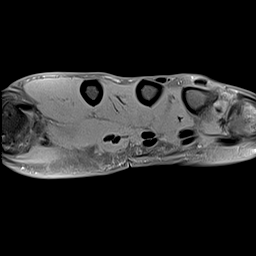
[im 9/34]
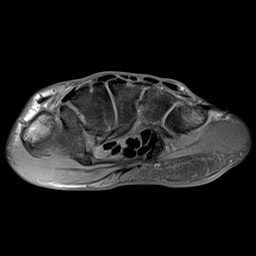
[im 17/34]
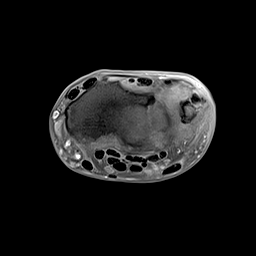
[im 25/34]
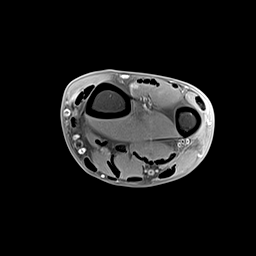
[im 34/34]
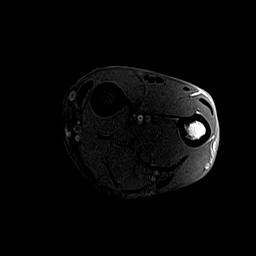

[Series 1033: T1 fat-sat · oblique · right · 4.0mm · 0.39mm/px · 5 of 34 slices shown (3 of 3)]
[im 1/34]
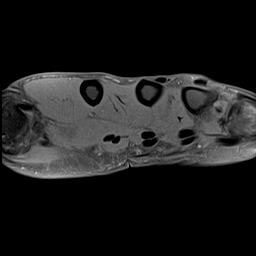
[im 9/34]
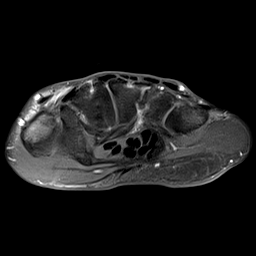
[im 17/34]
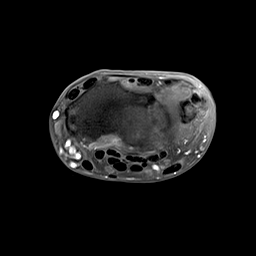
[im 25/34]
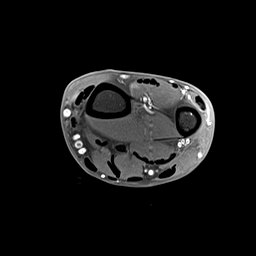
[im 34/34]
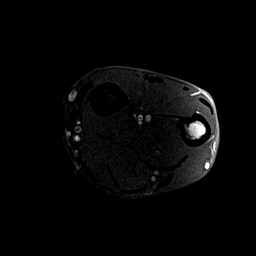

[Series 1035: T1 fat-sat post-contrast · oblique · right · 4.0mm · 0.39mm/px · 5 of 34 slices shown]
[im 1/34]
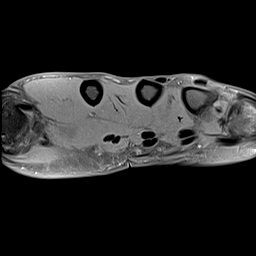
[im 9/34]
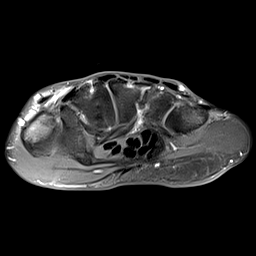
[im 17/34]
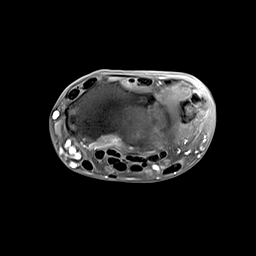
[im 25/34]
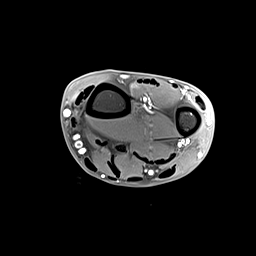
[im 34/34]
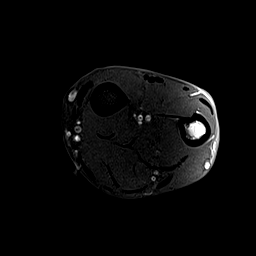

[38 of 40 positions shown; findings below may reference images not displayed]

FINDINGS: Ligaments: Extrinsic ligaments are intact. Degeneration of the
scapholunate ligament without a discrete tear. Intact lunotriquetral
ligament.

Triangular fibrocartilage: Intact

Tendons: Moderate tendinosis of the extensor carpi ulnaris tendon
with a more focal area of severe tendinosis and a partial-thickness
tear at the site of clinical palpable abnormality. Mild
tenosynovitis of the extensor carpi ulnaris tendon sheath. Remainder
of the extensor compartment tendons are intact. Flexor compartment
tendons are intact.

Carpal tunnel/median nerve: Flexor retinaculum is intact. Normal
carpal tunnel without a mass. Median nerve demonstrates normal
signal and caliber.

Guyon's canal: Normal Guyon's canal. Normal ulnar nerve.

Joint/cartilage: Mild osteoarthritis of the radiocarpal joint and
midcarpal joint with mild subchondral reactive marrow changes.
Moderate osteoarthritis of the first CMC joint. Mild osteoarthritis
of the distal radioulnar joint. Mild synovitis throughout the
carpus.

Bones/carpal alignment: No fracture, avascular necrosis, or osseous
lesion. Normal alignment.

Other: Muscles are normal. No fluid collection, hematoma, or soft
tissue mass.
IMPRESSION: 1. Moderate tendinosis of the extensor carpi ulnaris tendon with a
more focal area of severe tendinosis and a partial-thickness tear at
the site of clinical palpable abnormality. Mild tenosynovitis of the
extensor carpi ulnaris tendon sheath.
2. Mild osteoarthritis of the radiocarpal joint and midcarpal joint
with mild subchondral reactive marrow changes. Mild synovitis
throughout the carpus.
3. Moderate osteoarthritis of the first CMC joint.
4. Mild osteoarthritis of the distal radioulnar joint.

## 2022-10-29 MED ORDER — TECHNETIUM TC 99M SULFUR COLLOID
2.0000 | Freq: Once | INTRAVENOUS | Status: AC | PRN
  Administered 2022-10-29: 2.02 via ORAL

## 2022-10-29 MED ORDER — TECHNETIUM TC 99M SULFUR COLLOID FILTERED: 1.0000 | Freq: Once | INTRAVENOUS | Status: DC | PRN

## 2022-11-02 ENCOUNTER — Ambulatory Visit: Payer: BC Managed Care – PPO | Admitting: Urology

## 2022-11-04 ENCOUNTER — Other Ambulatory Visit: Payer: BC Managed Care – PPO

## 2022-11-05 ENCOUNTER — Other Ambulatory Visit: Payer: BC Managed Care – PPO

## 2022-11-23 ENCOUNTER — Ambulatory Visit: Payer: BC Managed Care – PPO | Admitting: Urology

## 2022-11-24 ENCOUNTER — Ambulatory Visit (INDEPENDENT_AMBULATORY_CARE_PROVIDER_SITE_OTHER): Payer: BC Managed Care – PPO | Admitting: Surgery

## 2022-11-24 ENCOUNTER — Encounter: Payer: Self-pay | Admitting: Surgery

## 2022-11-24 VITALS — BP 113/69 | HR 66 | Temp 98.0°F | Ht 68.0 in | Wt 206.8 lb

## 2022-11-24 DIAGNOSIS — K2100 Gastro-esophageal reflux disease with esophagitis, without bleeding: Secondary | ICD-10-CM | POA: Diagnosis not present

## 2022-11-24 NOTE — Patient Instructions (Signed)
If you have any concerns or questions, please feel free to call our office.   Gastroesophageal Reflux Disease, Adult Gastroesophageal reflux (GER) happens when acid from the stomach flows up into the tube that connects the mouth and the stomach (esophagus). Normally, food travels down the esophagus and stays in the stomach to be digested. However, when a person has GER, food and stomach acid sometimes move back up into the esophagus. If this becomes a more serious problem, the person may be diagnosed with a disease called gastroesophageal reflux disease (GERD). GERD occurs when the reflux: Happens often. Causes frequent or severe symptoms. Causes problems such as damage to the esophagus. When stomach acid comes in contact with the esophagus, the acid may cause inflammation in the esophagus. Over time, GERD may create small holes (ulcers) in the lining of the esophagus. What are the causes? This condition is caused by a problem with the muscle between the esophagus and the stomach (lower esophageal sphincter, or LES). Normally, the LES muscle closes after food passes through the esophagus to the stomach. When the LES is weakened or abnormal, it does not close properly, and that allows food and stomach acid to go back up into the esophagus. The LES can be weakened by certain dietary substances, medicines, and medical conditions, including: Tobacco use. Pregnancy. Having a hiatal hernia. Alcohol use. Certain foods and beverages, such as coffee, chocolate, onions, and peppermint. What increases the risk? You are more likely to develop this condition if you: Have an increased body weight. Have a connective tissue disorder. Take NSAIDs, such as ibuprofen. What are the signs or symptoms? Symptoms of this condition include: Heartburn. Difficult or painful swallowing and the feeling of having a lump in the throat. A bitter taste in the mouth. Bad breath and having a large amount of saliva. Having  an upset or bloated stomach and belching. Chest pain. Different conditions can cause chest pain. Make sure you see your health care provider if you experience chest pain. Shortness of breath or wheezing. Ongoing (chronic) cough or a nighttime cough. Wearing away of tooth enamel. Weight loss. How is this diagnosed? This condition may be diagnosed based on a medical history and a physical exam. To determine if you have mild or severe GERD, your health care provider may also monitor how you respond to treatment. You may also have tests, including: A test to examine your stomach and esophagus with a small camera (endoscopy). A test that measures the acidity level in your esophagus. A test that measures how much pressure is on your esophagus. A barium swallow or modified barium swallow test to show the shape, size, and functioning of your esophagus. How is this treated? Treatment for this condition may vary depending on how severe your symptoms are. Your health care provider may recommend: Changes to your diet. Medicine. Surgery. The goal of treatment is to help relieve your symptoms and to prevent complications. Follow these instructions at home: Eating and drinking  Follow a diet as recommended by your health care provider. This may involve avoiding foods and drinks such as: Coffee and tea, with or without caffeine. Drinks that contain alcohol. Energy drinks and sports drinks. Carbonated drinks or sodas. Chocolate and cocoa. Peppermint and mint flavorings. Garlic and onions. Horseradish. Spicy and acidic foods, including peppers, chili powder, curry powder, vinegar, hot sauces, and barbecue sauce. Citrus fruit juices and citrus fruits, such as oranges, lemons, and limes. Tomato-based foods, such as red sauce, chili, salsa, and pizza with  red sauce. Fried and fatty foods, such as donuts, french fries, potato chips, and high-fat dressings. High-fat meats, such as hot dogs and fatty cuts  of red and white meats, such as rib eye steak, sausage, ham, and bacon. High-fat dairy items, such as whole milk, butter, and cream cheese. Eat small, frequent meals instead of large meals. Avoid drinking large amounts of liquid with your meals. Avoid eating meals during the 2-3 hours before bedtime. Avoid lying down right after you eat. Do not exercise right after you eat. Lifestyle  Do not use any products that contain nicotine or tobacco. These products include cigarettes, chewing tobacco, and vaping devices, such as e-cigarettes. If you need help quitting, ask your health care provider. Try to reduce your stress by using methods such as yoga or meditation. If you need help reducing stress, ask your health care provider. If you are overweight, reduce your weight to an amount that is healthy for you. Ask your health care provider for guidance about a safe weight loss goal. General instructions Pay attention to any changes in your symptoms. Take over-the-counter and prescription medicines only as told by your health care provider. Do not take aspirin, ibuprofen, or other NSAIDs unless your health care provider told you to take these medicines. Wear loose-fitting clothing. Do not wear anything tight around your waist that causes pressure on your abdomen. Raise (elevate) the head of your bed about 6 inches (15 cm). You can use a wedge to do this. Avoid bending over if this makes your symptoms worse. Keep all follow-up visits. This is important. Contact a health care provider if: You have: New symptoms. Unexplained weight loss. Difficulty swallowing or it hurts to swallow. Wheezing or a persistent cough. A hoarse voice. Your symptoms do not improve with treatment. Get help right away if: You have sudden pain in your arms, neck, jaw, teeth, or back. You suddenly feel sweaty, dizzy, or light-headed. You have chest pain or shortness of breath. You vomit and the vomit is green, yellow, or  black, or it looks like blood or coffee grounds. You faint. You have stool that is red, bloody, or black. You cannot swallow, drink, or eat. These symptoms may represent a serious problem that is an emergency. Do not wait to see if the symptoms will go away. Get medical help right away. Call your local emergency services (911 in the [place] Do not drive yourself to the hospital. Summary Gastroesophageal reflux happens when acid from the stomach flows up into the esophagus. GERD is a disease in which the reflux happens often, causes frequent or severe symptoms, or causes problems such as damage to the esophagus. Treatment for this condition may vary depending on how severe your symptoms are. Your health care provider may recommend diet and lifestyle changes, medicine, or surgery. Contact a health care provider if you have new or worsening symptoms. Take over-the-counter and prescription medicines only as told by your health care provider. Do not take aspirin, ibuprofen, or other NSAIDs unless your health care provider told you to do so. Keep all follow-up visits as told by your health care provider. This is important. This information is not intended to replace advice given to you by your health care provider. Make sure you discuss any questions you have with your health care provider. Document Revised: 05/12/2020 Document Reviewed: 05/12/2020 Elsevier Patient Education  [place].

## 2022-11-26 NOTE — Progress Notes (Signed)
Outpatient Surgical Follow Up  11/26/2022  Brian Nielsen is an 62 y.o. male.   Chief Complaint  Patient presents with   Follow-up    Reflux, discuss surgery     HPI: Is following for reflux we have done appropriate workup to evidence of significant swallowing show no evidence of a stricture there is evidence of reflux gastric emptying study showed normal function of the stomach. He now has changed PPI and is doing very well he is very happy  Past Medical History:  Diagnosis Date   Adult celiac disease    Anemia    BPH (benign prostatic hyperplasia)    CKD (chronic kidney disease)    DM (diabetes mellitus) ([place])    Type II   GERD (gastroesophageal reflux disease)    HTN (hypertension)    Hyperlipidemia    Thoracic spondylosis without myelopathy 11/22/2016    Past Surgical History:  Procedure Laterality Date   BIOPSY N/A 09/07/2021   Procedure: BIOPSY;  Surgeon: Lucilla Lame, MD;  Location: [place];  Service: Endoscopy;  Laterality: N/A;   COLONOSCOPY  2003   Dr. Laural Golden: two small polyps, small ext hemorrhoids.path:focal adenomatous changes   COLONOSCOPY N/A 05/03/2014   Procedure: COLONOSCOPY;  Surgeon: Danie Binder, MD;  [facility];  Service: Endoscopy;  Laterality: N/A;  12:00   COLONOSCOPY WITH PROPOFOL N/A 09/07/2021   Procedure: COLONOSCOPY WITH PROPOFOL;  Surgeon: Lucilla Lame, MD;  Location: [place];  Service: Endoscopy;  Laterality: N/A;   ESOPHAGOGASTRODUODENOSCOPY N/A 05/03/2014   Procedure: ESOPHAGOGASTRODUODENOSCOPY (EGD);  Surgeon: Danie Binder, MD;  [facility];  Service: Endoscopy;  Laterality: N/A;   ESOPHAGOGASTRODUODENOSCOPY (EGD) WITH PROPOFOL N/A 06/03/2020   Procedure: ESOPHAGOGASTRODUODENOSCOPY (EGD) WITH PROPOFOL;  Surgeon: [name], MD;  Location: [facility];  Service: Endoscopy;  Laterality: N/A;   ESOPHAGOGASTRODUODENOSCOPY (EGD) WITH PROPOFOL N/A 09/07/2021   Procedure:  ESOPHAGOGASTRODUODENOSCOPY (EGD) WITH PROPOFOL;  Surgeon: Lucilla Lame, MD;  Location: [place];  Service: Endoscopy;  Laterality: N/A;  Diabetic   PARS PLANA VITRECTOMY Right 03/21/2021   Procedure: PARS PLANA VITRECTOMY 25 GAUGE WITH INJECTION OF ANTIBIOTICS FOR ENDOPHTHALMITIS;  Surgeon: Jalene Mullet, MD;  Location: [place];  Service: Ophthalmology;  Laterality: Right;   POLYPECTOMY N/A 09/07/2021   Procedure: POLYPECTOMY;  Surgeon: Lucilla Lame, MD;  Location: [place];  Service: Endoscopy;  Laterality: N/A;   REPAIR EXTENSOR TENDON Right 08/11/2022   Procedure: EXTENSOR CARPI ULNARIS TENDON DEBRIDEMENT, POSSIBLE SUBSHEATH RECONSTRUCTION;  Surgeon: Sherilyn Cooter, MD;  Location: [place];  Service: Orthopedics;  Laterality: Right;  or regional plus MAC    Family History  Problem Relation Age of Onset   Aneurysm Mother        brain   Hypertension Mother    Cancer Father    Heart disease Father    Diabetes Father    Hypertension Father    Hyperlipidemia Father    Cancer Maternal Grandmother        melanoma   Heart disease Paternal Grandfather    Colon cancer Neg Hx     Social History:  reports that he has never smoked. His smokeless tobacco use includes chew. He reports that he does not currently use alcohol. He reports that he does not use drugs.  Allergies:  Allergies  Allergen Reactions   Duloxetine Other (See Comments)    "disoriented, confused, couldn't drive"   Gluten Meal     Celiac Disease  Codeine Rash    Medications reviewed.    ROS Full ROS performed and is otherwise negative other than what is stated in HPI   BP 113/69   Pulse 66   Temp 98 F (36.7 C) (Oral)   Ht _0  (1.727 m)   Wt 206 lb 12.8 oz (93.8 kg)   SpO2 97%   BMI 31.44 kg/m   Physical Exam CONSTITUTIONAL: Chronically ill. EYES: Pupils are equal, round, and reactive to light, Sclera are non-icteric. EARS, NOSE, MOUTH AND THROAT: The oropharynx  is clear. The oral mucosa is pink and moist. Hearing is intact to voice. LYMPH NODES:  Lymph nodes in the neck are normal. RESPIRATORY:  Lungs are clear. There is normal respiratory effort, with equal breath sounds bilaterally, and without pathologic use of accessory muscles. CARDIOVASCULAR: Heart is regular without murmurs, gallops, or rubs. GI: The abdomen is  soft, nontender, and nondistended. There are no palpable masses. There is no hepatosplenomegaly. There are normal bowel sounds in all quadrants. GU: Rectal deferred.   MUSCULOSKELETAL: Normal muscle strength and tone. No cyanosis or edema.   SKIN: Turgor is good and there are no pathologic skin lesions or ulcers. NEUROLOGIC: Motor and sensation is grossly normal. Cranial nerves are grossly intact. PSYCH:  Oriented to person, place and time. Affect is normal.    Assessment/Plan:  1. Gastroesophageal reflux disease with esophagitis without hemorrhage  Currently the symptoms have dramatically improved he wishes to hold off surgical intervention if the PPI does not he understands that he can tolerate functional GI issues that are not necessarily easy to deal with  please note that I spent 30 minutes in this encounter including personally reviewing imaging studies, coordinating her care, placing orders and performing appropriate  documentation. A copy of this report was sent to the referring provider  Caroleen Hamman, MD [facility] General Surgeon

## 2022-11-30 DIAGNOSIS — M19021 Primary osteoarthritis, right elbow: Secondary | ICD-10-CM | POA: Insufficient documentation

## 2022-12-13 NOTE — Progress Notes (Signed)
History of Present Illness: Brian Nielsen is here for follwup of USD.  At his last visit here in October he was found to have a distal urethral/meatal stenosis.  Cystoscopy revealed no other urethral lesions and minimal prostatic obstruction.  He was put on alfuzosin.  He comes in today for recheck.  He has not been routinely doing urethral dilations.  He would rather not do this long-term and would like to consider surgical management of his meatal stenosis.  Past Medical History:  Diagnosis Date   Adult celiac disease    Anemia    BPH (benign prostatic hyperplasia)    CKD (chronic kidney disease)    DM (diabetes mellitus) ([facility])    Type II   GERD (gastroesophageal reflux disease)    HTN (hypertension)    Hyperlipidemia    Thoracic spondylosis without myelopathy 11/22/2016    Past Surgical History:  Procedure Laterality Date   BIOPSY N/A 09/07/2021   Procedure: BIOPSY;  Surgeon: Lucilla Lame, MD;  Location: [place];  Service: Endoscopy;  Laterality: N/A;   COLONOSCOPY  2003   Dr. Laural Golden: two small polyps, small ext hemorrhoids.path:focal adenomatous changes   COLONOSCOPY N/A 05/03/2014   Procedure: COLONOSCOPY;  Surgeon: Danie Binder, MD;  [facility];  Service: Endoscopy;  Laterality: N/A;  12:00   COLONOSCOPY WITH PROPOFOL N/A 09/07/2021   Procedure: COLONOSCOPY WITH PROPOFOL;  Surgeon: Lucilla Lame, MD;  Location: [place];  Service: Endoscopy;  Laterality: N/A;   ESOPHAGOGASTRODUODENOSCOPY N/A 05/03/2014   Procedure: ESOPHAGOGASTRODUODENOSCOPY (EGD);  Surgeon: Danie Binder, MD;  [facility];  Service: Endoscopy;  Laterality: N/A;   ESOPHAGOGASTRODUODENOSCOPY (EGD) WITH PROPOFOL N/A 06/03/2020   Procedure: ESOPHAGOGASTRODUODENOSCOPY (EGD) WITH PROPOFOL;  Surgeon: [name], MD;  Location: [facility];  Service: Endoscopy;  Laterality: N/A;   ESOPHAGOGASTRODUODENOSCOPY (EGD) WITH PROPOFOL N/A 09/07/2021   Procedure:  ESOPHAGOGASTRODUODENOSCOPY (EGD) WITH PROPOFOL;  Surgeon: Lucilla Lame, MD;  Location: [place];  Service: Endoscopy;  Laterality: N/A;  Diabetic   PARS PLANA VITRECTOMY Right 03/21/2021   Procedure: PARS PLANA VITRECTOMY 25 GAUGE WITH INJECTION OF ANTIBIOTICS FOR ENDOPHTHALMITIS;  Surgeon: Jalene Mullet, MD;  Location: [place];  Service: Ophthalmology;  Laterality: Right;   POLYPECTOMY N/A 09/07/2021   Procedure: POLYPECTOMY;  Surgeon: Lucilla Lame, MD;  Location: [place];  Service: Endoscopy;  Laterality: N/A;   REPAIR EXTENSOR TENDON Right 08/11/2022   Procedure: EXTENSOR CARPI ULNARIS TENDON DEBRIDEMENT, POSSIBLE SUBSHEATH RECONSTRUCTION;  Surgeon: Sherilyn Cooter, MD;  Location: [place];  Service: Orthopedics;  Laterality: Right;  or regional plus MAC    Home Medications:  Allergies as of 12/14/2022       Reactions   Duloxetine Other (See Comments)   "disoriented, confused, couldn't drive"   Gluten Meal    Celiac Disease   Codeine Rash        Medication List        Accurate as of December 13, 2022  6:56 PM. If you have any questions, ask your nurse or doctor.          alfuzosin 10 MG 24 hr tablet Commonly known as: UROXATRAL Take 1 tablet (10 mg total) by mouth daily.   aspirin EC 81 MG tablet Take 81 mg by mouth daily.   atorvastatin 40 MG tablet Commonly known as: LIPITOR Take 1 tablet (40 mg total) by mouth daily. What changed: when to take this   B COMPLEX PO Take 1 tablet by mouth  daily.   carvedilol 3.125 MG tablet Commonly known as: COREG   Dialyvite Vitamin D 5000 125 MCG (5000 UT) capsule Generic drug: Cholecalciferol Take 5,000 Units by mouth daily.   fenofibrate 160 MG tablet Take 160 mg by mouth daily.   FISH OIL PO Take 1 tablet by mouth every other day.   gabapentin 300 MG capsule Commonly known as: NEURONTIN Take 900 mg by mouth 3 (three) times daily.   Jardiance 25 MG Tabs tablet Generic drug:  empagliflozin Take 25 mg by mouth daily.   Lantus SoloStar 100 UNIT/ML Solostar Pen Generic drug: insulin glargine 50 Units daily.   lisinopril 20 MG tablet Commonly known as: ZESTRIL Take 20 mg by mouth daily.   Mounjaro 10 MG/0.5ML Pen Generic drug: tirzepatide Inject 10 mg into the skin once a week.   Multi-Enzyme Tabs Take 1 tablet by mouth daily.   multivitamin with minerals Tabs tablet Take 1 tablet by mouth daily.   pantoprazole 40 MG tablet Commonly known as: PROTONIX Take 1 tablet by mouth every evening.   vitamin C 1000 MG tablet Take 1,000 mg by mouth daily.        Allergies:  Allergies  Allergen Reactions   Duloxetine Other (See Comments)    "disoriented, confused, couldn't drive"   Gluten Meal     Celiac Disease   Codeine Rash    Family History  Problem Relation Age of Onset   Aneurysm Mother        brain   Hypertension Mother    Cancer Father    Heart disease Father    Diabetes Father    Hypertension Father    Hyperlipidemia Father    Cancer Maternal Grandmother        melanoma   Heart disease Paternal Grandfather    Colon cancer Neg Hx     Social History:  reports that he has never smoked. His smokeless tobacco use includes chew. He reports that he does not currently use alcohol. He reports that he does not use drugs.  ROS: A complete review of systems was performed.  All systems are negative except for pertinent findings as noted.  Physical Exam:  Vital signs in last 24 hours: There were no vitals taken for this visit. Constitutional:  Alert and oriented, No acute distress Cardiovascular: Regular rate  Respiratory: Normal respiratory effort Neurologic: Grossly intact, no focal deficits Psychiatric: Normal mood and affect  I have reviewed prior pt notes  I have reviewed notes from referring/previous physicians--both endocrine and internal medicine doctors (have since been terminated by the patient)  I have reviewed urinalysis  results  I have independently reviewed prior imaging    Impression/Assessment:  Distal urethral/meatal strictures with significant urinary symptomatology due to this.  He has not been doing dilatations routinely  Plan:  I did discus meatotomy with possible Optilume management of any other distal urethral stricture with him  We will look towards doing this in the future.  I did discuss with him the fact that any procedure on the urethra increases the risk of recurrent strictures although an Optilume minimizes that

## 2022-12-14 ENCOUNTER — Ambulatory Visit (INDEPENDENT_AMBULATORY_CARE_PROVIDER_SITE_OTHER): Payer: BC Managed Care – PPO | Admitting: Urology

## 2022-12-14 VITALS — BP 219/101 | HR 65

## 2022-12-14 DIAGNOSIS — N35010 Post-traumatic urethral stricture, male, meatal: Secondary | ICD-10-CM

## 2022-12-14 DIAGNOSIS — N401 Enlarged prostate with lower urinary tract symptoms: Secondary | ICD-10-CM

## 2022-12-14 DIAGNOSIS — N486 Induration penis plastica: Secondary | ICD-10-CM | POA: Diagnosis not present

## 2022-12-14 DIAGNOSIS — N5201 Erectile dysfunction due to arterial insufficiency: Secondary | ICD-10-CM

## 2022-12-15 LAB — URINALYSIS, ROUTINE W REFLEX MICROSCOPIC
Bilirubin, UA: NEGATIVE
Ketones, UA: NEGATIVE
Leukocytes,UA: NEGATIVE
Nitrite, UA: NEGATIVE
Specific Gravity, UA: 1.02 (ref 1.005–1.030)
Urobilinogen, Ur: 0.2 mg/dL (ref 0.2–1.0)
pH, UA: 6 (ref 5.0–7.5)

## 2022-12-15 LAB — MICROSCOPIC EXAMINATION: Bacteria, UA: NONE SEEN

## 2022-12-21 ENCOUNTER — Encounter (HOSPITAL_COMMUNITY): Payer: BC Managed Care – PPO

## 2022-12-21 ENCOUNTER — Other Ambulatory Visit: Payer: Self-pay | Admitting: Urology

## 2022-12-21 ENCOUNTER — Telehealth: Payer: Self-pay

## 2022-12-21 NOTE — Telephone Encounter (Signed)
Patient's wife called advising she had questions regarding how long the patient might need to recover. She would like a call at your convenience..     Thank you

## 2022-12-23 ENCOUNTER — Encounter: Payer: Self-pay | Admitting: Urology

## 2022-12-24 ENCOUNTER — Other Ambulatory Visit (HOSPITAL_COMMUNITY): Payer: Self-pay | Admitting: Nephrology

## 2022-12-24 DIAGNOSIS — I10 Essential (primary) hypertension: Secondary | ICD-10-CM

## 2022-12-27 ENCOUNTER — Ambulatory Visit (HOSPITAL_COMMUNITY)
Admission: RE | Admit: 2022-12-27 | Discharge: 2022-12-27 | Disposition: A | Discharge status: Home / Self Care | Payer: BC Managed Care – PPO | Source: Ambulatory Visit | Attending: Surgery | Admitting: Surgery

## 2022-12-27 ENCOUNTER — Encounter: Payer: Self-pay | Admitting: "Endocrinology

## 2022-12-27 ENCOUNTER — Ambulatory Visit (INDEPENDENT_AMBULATORY_CARE_PROVIDER_SITE_OTHER): Payer: BC Managed Care – PPO | Admitting: "Endocrinology

## 2022-12-27 VITALS — BP 150/70 | HR 56 | Ht 68.0 in | Wt 211.4 lb

## 2022-12-27 DIAGNOSIS — I10 Essential (primary) hypertension: Secondary | ICD-10-CM

## 2022-12-27 DIAGNOSIS — N183 Chronic kidney disease, stage 3 unspecified: Secondary | ICD-10-CM | POA: Diagnosis not present

## 2022-12-27 DIAGNOSIS — E782 Mixed hyperlipidemia: Secondary | ICD-10-CM | POA: Diagnosis not present

## 2022-12-27 DIAGNOSIS — E1122 Type 2 diabetes mellitus with diabetic chronic kidney disease: Secondary | ICD-10-CM

## 2022-12-27 MED ORDER — TIRZEPATIDE 5 MG/0.5ML ~~LOC~~ SOAJ: 5.0000 mg | SUBCUTANEOUS | 1 refills | Status: DC

## 2022-12-27 NOTE — Patient Instructions (Signed)

## 2022-12-27 NOTE — Progress Notes (Signed)
Endocrinology Consult Note       12/27/2022, 4:47 PM   Subjective:    Patient ID: Brian Nielsen, male    DOB: Feb 14, 1961.  Brian Nielsen is being seen in consultation for management of currently uncontrolled symptomatic diabetes requested by  Johnette Abraham, MD.   Past Medical History:  Diagnosis Date   Adult celiac disease    Anemia    BPH (benign prostatic hyperplasia)    CKD (chronic kidney disease)    DM (diabetes mellitus) ([place])    Type II   GERD (gastroesophageal reflux disease)    HTN (hypertension)    Hyperlipidemia    Thoracic spondylosis without myelopathy 11/22/2016    Past Surgical History:  Procedure Laterality Date   BIOPSY N/A 09/07/2021   Procedure: BIOPSY;  Surgeon: Lucilla Lame, MD;  Location: [place];  Service: Endoscopy;  Laterality: N/A;   COLONOSCOPY  2003   Dr. Laural Golden: two small polyps, small ext hemorrhoids.path:focal adenomatous changes   COLONOSCOPY N/A 05/03/2014   Procedure: COLONOSCOPY;  Surgeon: Danie Binder, MD;  [facility];  Service: Endoscopy;  Laterality: N/A;  12:00   COLONOSCOPY WITH PROPOFOL N/A 09/07/2021   Procedure: COLONOSCOPY WITH PROPOFOL;  Surgeon: Lucilla Lame, MD;  Location: [place];  Service: Endoscopy;  Laterality: N/A;   ESOPHAGOGASTRODUODENOSCOPY N/A 05/03/2014   Procedure: ESOPHAGOGASTRODUODENOSCOPY (EGD);  Surgeon: Danie Binder, MD;  [facility];  Service: Endoscopy;  Laterality: N/A;   ESOPHAGOGASTRODUODENOSCOPY (EGD) WITH PROPOFOL N/A 06/03/2020   Procedure: ESOPHAGOGASTRODUODENOSCOPY (EGD) WITH PROPOFOL;  Surgeon: [name], MD;  Location: [facility];  Service: Endoscopy;  Laterality: N/A;   ESOPHAGOGASTRODUODENOSCOPY (EGD) WITH PROPOFOL N/A 09/07/2021   Procedure: ESOPHAGOGASTRODUODENOSCOPY (EGD) WITH PROPOFOL;  Surgeon: Lucilla Lame, MD;  Location: [place];  Service:  Endoscopy;  Laterality: N/A;  Diabetic   PARS PLANA VITRECTOMY Right 03/21/2021   Procedure: PARS PLANA VITRECTOMY 25 GAUGE WITH INJECTION OF ANTIBIOTICS FOR ENDOPHTHALMITIS;  Surgeon: Jalene Mullet, MD;  Location: [place];  Service: Ophthalmology;  Laterality: Right;   POLYPECTOMY N/A 09/07/2021   Procedure: POLYPECTOMY;  Surgeon: Lucilla Lame, MD;  Location: [place];  Service: Endoscopy;  Laterality: N/A;   REPAIR EXTENSOR TENDON Right 08/11/2022   Procedure: EXTENSOR CARPI ULNARIS TENDON DEBRIDEMENT, POSSIBLE SUBSHEATH RECONSTRUCTION;  Surgeon: Sherilyn Cooter, MD;  Location: [place];  Service: Orthopedics;  Laterality: Right;  or regional plus MAC    Social History   Socioeconomic History   Marital status: Married    Spouse name: [name]   Number of children: 1   Years of education: 12   Highest education level: Not on file  Occupational History   Occupation: Programmer, systems: [employer]    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  Tobacco Use   Smoking status: Never   Smokeless tobacco: Current    Types: Chew   Tobacco comments:    Quit x 30 years  Vaping Use   Vaping Use: Never used  Substance and Sexual Activity   Alcohol use: Not Currently   Drug use: Not Currently   Sexual  activity: Yes    Birth control/protection: Surgical  Other Topics Concern   Not on file  Social History Narrative   Lives with wife   [place]   Caffeine- diet Mtn Dew, 2 glasses   Social Determinants of Health   Financial Resource Strain: Not on file  Food Insecurity: Not on file  Transportation Needs: Not on file  Physical Activity: Not on file  Stress: Not on file  Social Connections: Not on file    Family History  Problem Relation Age of Onset   Aneurysm Mother        brain   Hypertension Mother    Cancer Father    Heart disease Father    Diabetes Father    Hypertension Father    Hyperlipidemia Father    Cancer  Maternal Grandmother        melanoma   Heart disease Paternal Grandfather    Colon cancer Neg Hx     Outpatient Encounter Medications as of 12/27/2022  Medication Sig   Calcium Carbonate (CALCIUM 500 PO) Take 1 tablet by mouth daily.   cloNIDine (CATAPRES) 0.2 MG tablet Take 0.2 mg by mouth 2 (two) times daily.   gabapentin (NEURONTIN) 300 MG capsule Take 900 mg by mouth 3 (three) times daily. Pt states he takes 10 tablets daily   tirzepatide [facility]) 5 MG/0.5ML Pen Inject 5 mg into the skin once a week.   vitamin E 180 MG (400 UNITS) capsule Take 400 Units by mouth 3 (three) times a week.   alfuzosin (UROXATRAL) 10 MG 24 hr tablet Take 1 tablet (10 mg total) by mouth daily.   Ascorbic Acid (VITAMIN C) 1000 MG tablet Take 1,000 mg by mouth daily.   aspirin 81 MG EC tablet Take 81 mg by mouth daily.   atorvastatin (LIPITOR) 40 MG tablet Take 1 tablet (40 mg total) by mouth daily. (Patient taking differently: Take 40 mg by mouth every evening.)   B Complex Vitamins (B COMPLEX PO) Take 1 tablet by mouth daily.   carvedilol (COREG) 3.125 MG tablet Take 3.125 mg by mouth 2 (two) times daily with a meal.   Cholecalciferol (DIALYVITE VITAMIN D 5000) 125 MCG (5000 UT) capsule Take 5,000 Units by mouth daily.   Digestive Enzymes (MULTI-ENZYME) TABS Take 1 tablet by mouth daily. (Patient not taking: Reported on 12/27/2022)   fenofibrate 160 MG tablet Take 160 mg by mouth daily.   furosemide (LASIX) 20 MG tablet Take 20 mg by mouth 2 (two) times daily.   LANTUS SOLOSTAR 100 UNIT/ML Solostar Pen Inject 60 Units into the skin at bedtime.   Multiple Vitamin (MULTIVITAMIN WITH MINERALS) TABS tablet Take 1 tablet by mouth daily.   Omega-3 Fatty Acids (FISH OIL PO) Take 1 tablet by mouth every other day.   pantoprazole (PROTONIX) 40 MG tablet Take 1 tablet by mouth every evening.   [DISCONTINUED] JARDIANCE 25 MG TABS tablet Take 25 mg by mouth daily. (Patient not taking: Reported on 12/14/2022)    [DISCONTINUED] lisinopril (ZESTRIL) 20 MG tablet Take 20 mg by mouth daily. (Patient not taking: Reported on 12/27/2022)   [DISCONTINUED] MOUNJARO 10 MG/0.5ML Pen Inject 10 mg into the skin once a week. (Patient not taking: Reported on 12/27/2022)   No facility-administered encounter medications on file as of 12/27/2022.    ALLERGIES: Allergies  Allergen Reactions   Duloxetine Other (See Comments)    "disoriented, confused, couldn't drive"   Gluten Meal     Celiac  Disease   Codeine Rash    VACCINATION STATUS: Immunization History  Administered Date(s) Administered   Influenza Inj Mdck Quad Pf 08/24/2018, 08/03/2019   Influenza,inj,Quad PF,6+ Mos 10/19/2016   Influenza-Unspecified 10/19/2016, 09/01/2020   Janssen (J&J) SARS-COV-2 Vaccination 06/16/2020   Pneumococcal Polysaccharide-23 08/15/2020   Tdap 10/19/2016    Diabetes He presents for his initial diabetic visit. He has type 2 diabetes mellitus. Onset time: He was diagnosed at approximate age of 29 years. There are no hypoglycemic associated symptoms. Pertinent negatives for hypoglycemia include no confusion, headaches, pallor or seizures. Associated symptoms include polydipsia and polyuria. Pertinent negatives for diabetes include no chest pain, no fatigue, no polyphagia and no weakness. There are no hypoglycemic complications. Symptoms are stable. Diabetic complications include nephropathy and peripheral neuropathy. (He has Charcot's deformity of lower extremities, worse on the right foot. ) Risk factors for coronary artery disease include diabetes mellitus, dyslipidemia, male sex, hypertension, sedentary lifestyle and tobacco exposure. Current diabetic treatment includes insulin injections (He is currently on Lantus 50 units nightly, Mounjaro was given up to 10 mg weekly, Jardiance 25 mg p.o. daily was also utilized at 1 time.). His weight is fluctuating minimally. He is following a generally unhealthy diet. When asked about meal  planning, he reported none. He has not had a previous visit with a dietitian. Home blood sugar record trend: He brought in a meter showing average blood glucose between 1 73-220 for the last 30 days.  His recent a1c was  7.4%. His overall blood glucose range is 180-200 mg/dl. (Average blood glucose of 173-222 mg for the last 30 days.  Recent A1c was 7.4%.  This patient has a history of severely uncontrolled hyperglycemia over the years.) An ACE inhibitor/angiotensin II receptor blocker is not being taken.  Hyperlipidemia This is a chronic problem. The current episode started more than 1 year ago. The problem is uncontrolled. Exacerbating diseases include chronic renal disease, diabetes and obesity. Pertinent negatives include no chest pain, myalgias or shortness of breath. Current antihyperlipidemic treatment includes statins. Risk factors for coronary artery disease include dyslipidemia, diabetes mellitus, hypertension, male sex, obesity, a sedentary lifestyle and family history.  Hypertension The current episode started more than 1 year ago. The problem is uncontrolled. Pertinent negatives include no chest pain, headaches, neck pain, palpitations or shortness of breath. Risk factors for coronary artery disease include dyslipidemia, family history, obesity, male gender, sedentary lifestyle and smoking/tobacco exposure. Past treatments include beta blockers and central alpha agonists. Hypertensive end-organ damage includes kidney disease. Identifiable causes of hypertension include chronic renal disease.     Review of Systems  Constitutional:  Negative for chills, fatigue, fever and unexpected weight change.  HENT:  Negative for dental problem, mouth sores and trouble swallowing.   Eyes:  Negative for visual disturbance.  Respiratory:  Negative for cough, choking, chest tightness, shortness of breath and wheezing.   Cardiovascular:  Negative for chest pain, palpitations and leg swelling.   Gastrointestinal:  Negative for abdominal distention, abdominal pain, constipation, diarrhea, nausea and vomiting.  Endocrine: Positive for polydipsia and polyuria. Negative for polyphagia.  Genitourinary:  Negative for dysuria, flank pain, hematuria and urgency.  Musculoskeletal:  Positive for gait problem. Negative for back pain, myalgias and neck pain.       Right lower extremity in boots related to Charcot's disease   Skin:  Negative for pallor, rash and wound.  Neurological:  Negative for seizures, syncope, weakness, numbness and headaches.  Psychiatric/Behavioral:  Negative for confusion and dysphoric mood.  Objective:       12/27/2022   11:19 AM 12/14/2022    3:31 PM 12/14/2022    3:26 PM  Vitals with BMI  Height 5' 8"$     Weight 211 lbs 6 oz    BMI 99991111    Systolic [id number]  Diastolic 70 99991111 99  Pulse 56 65 64    BP (!) 150/70 Comment: 146/70 R arm with manuel cuff  Pulse (!) 56   Ht 5' 8"$  (1.727 m)   Wt 211 lb 6.4 oz (95.9 kg)   BMI 32.14 kg/m   Wt Readings from Last 3 Encounters:  12/27/22 211 lb 6.4 oz (95.9 kg)  11/24/22 206 lb 12.8 oz (93.8 kg)  10/12/22 206 lb (93.4 kg)     Physical Exam    CMP ( most recent) CMP     Component Value Date/Time   NA 137 10/12/2022 1211   K 4.0 10/12/2022 1211   CL 109 10/12/2022 1211   CO2 21 (L) 10/12/2022 1211   GLUCOSE 268 (H) 10/12/2022 1211   BUN 30 (H) 10/12/2022 1211   CREATININE 1.67 (H) 10/12/2022 1211   CREATININE 0.88 01/11/2017 0805   CALCIUM 8.8 (L) 10/12/2022 1211   CALCIUM 8.5 (L) 12/09/2021 1545   PROT 7.2 05/28/2021 1240   ALBUMIN 3.6 12/09/2021 1545   AST 26 05/28/2021 1240   ALT 27 05/28/2021 1240   ALKPHOS 85 05/28/2021 1240   BILITOT 0.6 05/28/2021 1240   GFRNONAA 46 (L) 10/12/2022 1211   GFRNONAA >89 01/11/2017 0805   GFRAA >89 01/11/2017 0805     Diabetic Labs (most recent): Lab Results  Component Value Date   HGBA1C 12.7 (H) 01/11/2017   HGBA1C 12.1 (H) 10/19/2016    MICROALBUR 55.3 10/19/2016     Lipid Panel ( most recent) Lipid Panel     Component Value Date/Time   CHOL 122 01/11/2017 0805   TRIG 388 (H) 01/11/2017 0805   HDL 29 (L) 01/11/2017 0805   CHOLHDL 4.2 01/11/2017 0805   VLDL 78 (H) 01/11/2017 0805   LDLCALC 15 01/11/2017 0805      Lab Results  Component Value Date   TSH 5.230 (H) 12/19/2019   TSH 1.82 10/19/2016      Assessment & Plan:   1) type 2 diabetes, complicated by CKD, peripheral neuropathy  - MIHAILO ALBRECHT has currently uncontrolled symptomatic type 2 DM since  62 years of age,  with most recent A1c of 7.4 %. Recent labs reviewed. - I had a long discussion with him about the possible risk factors and  the pathology behind its diabetes and its complications. -his diabetes is complicated by CKD, neuropathy and he remains at a high risk for more acute and chronic complications which include CAD, CVA, CKD, retinopathy, and neuropathy. These are all discussed in detail with him.  - I discussed all available options of managing his diabetes including de-escalation of medications. I have counseled him on Food as Medicine by adopting a Whole Food , Plant Predominant  ( WFPP) nutrition as recommended by [employer] of Lifestyle Medicine. Patient is encouraged to switch to  unprocessed or minimally processed  complex starch, adequate protein intake (mainly plant source), minimal liquid fat, plenty of fruits, and vegetables. -  he is advised to stick to a routine mealtimes to eat 3 complete meals a day and snack only when necessary ( to snack only to correct hypoglycemia BG <70 day time or <100 at night).   -  he acknowledges that there is a room for improvement in his food and drink choices. - Further Specific Suggestion is made for him to avoid simple carbohydrates  from his diet including Cakes, Sweet Desserts, Ice Cream, Soda (diet and regular), Sweet Tea, Candies, Chips, Cookies, Store Bought Juices, Alcohol ,  Artificial  Sweeteners,  Coffee Creamer, and "Sugar-free" Products. This will help patient to have more stable blood glucose profile and potentially avoid unintended weight gain.  The following Lifestyle Medicine recommendations according to [facility]) were discussed and offered to patient and he agrees to start the journey:  A.  Whole Foods, Plant-based plate comprising of fruits and vegetables, plant-based proteins, whole-grain carbohydrates was discussed in detail with the patient.   A list for source of those nutrients were also provided to the patient.  Patient will use only water or unsweetened tea for hydration. B.  The need to stay away from risky substances including alcohol, smoking; obtaining 7 to 9 hours of restorative sleep, at least 150 minutes of moderate intensity exercise weekly, the importance of healthy social connections,  and stress reduction techniques were discussed. C.  A full color page of  Calorie density of various food groups per pound showing examples of each food groups was provided to the patient.  - he will be scheduled with Jearld Fenton, RDN, CDE for individualized diabetes education.  - I have approached him with the following individualized plan to manage  his diabetes and patient agrees:   -He is advised to increase Lantus to 60 units nightly, approached to monitor blood glucose at least twice a day-daily before breakfast and at bedtime until his next visit in 3 months.    -This patient is not a suitable candidate for SGLT2 inhibitors, advised to discontinue Jardiance.   He would benefit from continued intervention with GLP-1 receptor agonist.  I discussed and initiated his Mounjaro again with 5 mg subcutaneously weekly.  Side effects and precautions discussed with him.    - Specific targets for  A1c;  LDL, HDL,  and Triglycerides were discussed with the patient.  2) Blood Pressure /Hypertension:  his blood pressure is uncontrolled to  target.   he is advised to continue his current medications including clonidine 0.2 mg p.o. twice daily, carvedilol 3.125 mg p.o. twice daily.  3) Lipids/Hyperlipidemia:   Review of his recent lipid panel showed uncontrolled hypertriglyceridemia at 388, controlled LDL.  He is advised to continue Lipitor 40 mg p.o. nightly, fenofibrate 160 mg p.o. daily, fish oil 1 g p.o. twice daily.     4)  Weight/Diet:  Body mass index is 32.14 kg/m.  -   clearly complicating his diabetes care.   he is  a candidate for weight loss. I discussed with him the fact that loss of 5 - 10% of his  current body weight will have the most impact on his diabetes management.  The above detailed  [facility] recommendations for nutrition, exercise, sleep, social life, avoidance of risky substances, the need for restorative sleep   information will also detailed on discharge instructions.  5) Chronic Care/Health Maintenance:  -he  is on Statin medications and  is encouraged to initiate and continue to follow up with Ophthalmology, Dentist,  Podiatrist at least yearly or according to recommendations, and advised to   stay away from smoking. I have recommended yearly flu vaccine and pneumonia vaccine at least every 5 years; moderate intensity exercise for up to 150 minutes weekly; and  sleep for 7- 9 hours a day.  - he is  advised to maintain close follow up with Doren Custard, Hazle Nordmann, MD for primary care needs, as well as his other providers for optimal and coordinated care.   I spent 62 minutes in the care of the patient today including review of labs from [place], Lipids, Thyroid Function, Hematology (current and previous including abstractions from other facilities); face-to-face time discussing  his blood glucose readings/logs, discussing hypoglycemia and hyperglycemia episodes and symptoms, medications doses, his options of short and long term treatment based on the latest standards of care / guidelines;  discussion about incorporating  lifestyle medicine;  and documenting the encounter. Risk reduction counseling performed per USPSTF guidelines to reduce  obesity and cardiovascular risk factors.      Please refer to Patient Instructions for Blood Glucose Monitoring and Insulin/Medications Dosing Guide"  in media tab for additional information. Please  also refer to " Patient Self Inventory" in the Media  tab for reviewed elements of pertinent patient history.  Brian Nielsen participated in the discussions, expressed understanding, and voiced agreement with the above plans.  All questions were answered to his satisfaction. he is encouraged to contact clinic should he have any questions or concerns prior to his return visit.   Follow up plan: - Return in about 3 months (around 03/27/2023) for Bring Meter/CGM Device/Logs- A1c in Office.  Glade Lloyd, MD [facility] [address] [place], [place] [address] Phone: [phone]  Fax: [phone]    12/27/2022, 4:47 PM  This note was partially dictated with voice recognition software. Similar sounding words can be transcribed inadequately or may not  be corrected upon review.

## 2022-12-28 ENCOUNTER — Encounter (HOSPITAL_BASED_OUTPATIENT_CLINIC_OR_DEPARTMENT_OTHER): Payer: Self-pay | Admitting: Pulmonary Disease

## 2022-12-28 ENCOUNTER — Ambulatory Visit (INDEPENDENT_AMBULATORY_CARE_PROVIDER_SITE_OTHER): Payer: BC Managed Care – PPO | Admitting: Pulmonary Disease

## 2022-12-28 VITALS — BP 150/70 | HR 56 | Temp 98.1°F | Ht 68.0 in | Wt 211.0 lb

## 2022-12-28 DIAGNOSIS — R0683 Snoring: Secondary | ICD-10-CM | POA: Diagnosis not present

## 2022-12-28 DIAGNOSIS — G4733 Obstructive sleep apnea (adult) (pediatric): Secondary | ICD-10-CM

## 2022-12-28 NOTE — Progress Notes (Signed)
Subjective:    Patient ID: Brian Nielsen, male    DOB: [birth year], 62 y.o.   MRN: [id number]  HPI  Chief Complaint  Patient presents with   sleep consult    Pt states that he has been told that he snores at night and also has stopped breathing while sleeping. Pt said when he wakes up in the morning, sometimes he feels rested but some times he feels like he has not slept at all.   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  He reports a diagnosis of [facility] but could not tolerate CPAP. His wife Brian Nielsen accompanies and provides sleep history.  She has noted to have snoring, he rolls around and moves around in bed a lot to the point where she has had to move to a different bed.  He sleeps on his stomach, he states that if he sleeps on his back he will choke and gasp in his sleep. Epworth Sleepiness Scale is 9 and reports some sleepiness as a passenger in a car or lying down to rest in the afternoon or after lunch. Bedtime is around 10 PM, sleep latency about 15 minutes, he sleeps on his stomach with 1 pillow, reports 2-3 nocturnal awakenings and is out of bed by 5 AM feeling tired with dryness of mouth but denies headaches. He has gained 20 pounds in the last 2 years There is no history suggestive of cataplexy, sleep paralysis or parasomnias    PMH - GERD  Hypertension Diabetes for 20 years CKD   Past Medical History:  Diagnosis Date   Adult celiac disease    Anemia    BPH (benign prostatic hyperplasia)    CKD (chronic kidney disease)    DM (diabetes mellitus) (HCC)    Type II   GERD (gastroesophageal reflux disease)    HTN (hypertension)    Hyperlipidemia    Thoracic spondylosis without myelopathy 11/22/2016   Past Surgical History:  Procedure Laterality Date   BIOPSY N/A 09/07/2021   Procedure: BIOPSY;  Surgeon: Lucilla Lame, MD;  Location: [place];  Service:  Endoscopy;  Laterality: N/A;   COLONOSCOPY  2003   Dr. Laural Golden: two small polyps, small ext hemorrhoids.path:focal adenomatous changes   COLONOSCOPY N/A 05/03/2014   Procedure: COLONOSCOPY;  Surgeon: Danie Binder, MD;  [facility];  Service: Endoscopy;  Laterality: N/A;  12:00   COLONOSCOPY WITH PROPOFOL N/A 09/07/2021   Procedure: COLONOSCOPY WITH PROPOFOL;  Surgeon: Lucilla Lame, MD;  Location: [place];  Service: Endoscopy;  Laterality: N/A;   ESOPHAGOGASTRODUODENOSCOPY N/A 05/03/2014   Procedure: ESOPHAGOGASTRODUODENOSCOPY (EGD);  Surgeon: Danie Binder, MD;  [facility];  Service: Endoscopy;  Laterality: N/A;   ESOPHAGOGASTRODUODENOSCOPY (EGD) WITH PROPOFOL N/A 06/03/2020   Procedure: ESOPHAGOGASTRODUODENOSCOPY (EGD) WITH PROPOFOL;  Surgeon: [name], MD;  Location: [facility];  Service: Endoscopy;  Laterality: N/A;   ESOPHAGOGASTRODUODENOSCOPY (EGD) WITH PROPOFOL N/A 09/07/2021   Procedure: ESOPHAGOGASTRODUODENOSCOPY (EGD) WITH PROPOFOL;  Surgeon: Lucilla Lame, MD;  Location: [place];  Service: Endoscopy;  Laterality: N/A;  Diabetic   PARS PLANA VITRECTOMY Right 03/21/2021   Procedure: PARS PLANA VITRECTOMY 25 GAUGE WITH INJECTION OF ANTIBIOTICS FOR ENDOPHTHALMITIS;  Surgeon: Jalene Mullet, MD;  Location: [place];  Service: Ophthalmology;  Laterality: Right;   POLYPECTOMY N/A 09/07/2021   Procedure: POLYPECTOMY;  Surgeon: Lucilla Lame, MD;  Location:  [place];  Service: Endoscopy;  Laterality: N/A;   REPAIR EXTENSOR TENDON Right 08/11/2022   Procedure: EXTENSOR CARPI ULNARIS TENDON DEBRIDEMENT, POSSIBLE SUBSHEATH RECONSTRUCTION;  Surgeon: Sherilyn Cooter, MD;  Location: [place];  Service: Orthopedics;  Laterality: Right;  or regional plus MAC    Allergies  Allergen Reactions   Duloxetine Other (See Comments)    "disoriented, confused, couldn't drive"   Gluten Meal     Celiac Disease   Codeine  Rash    Social History   Socioeconomic History   Marital status: Married    Spouse name: [name]   Number of children: 1   Years of education: 12   Highest education level: Not on file  Occupational History   Occupation: Programmer, systems: [employer]    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  Tobacco Use   Smoking status: Never   Smokeless tobacco: Current    Types: Chew   Tobacco comments:    Quit x 30 years  Vaping Use   Vaping Use: Never used  Substance and Sexual Activity   Alcohol use: Not Currently   Drug use: Not Currently   Sexual activity: Yes    Birth control/protection: Surgical  Other Topics Concern   Not on file  Social History Narrative   Lives with wife   [place]ch[place]   Caffeine- diet Mtn Dew, 2 glasses   Social Determinants of Health   Financial Resource Strain: Not on file  Food Insecurity: Not on file  Transportation Needs: Not on file  Physical Activity: Not on file  Stress: Not on file  Social Connections: Not on file  Intimate Partner Violence: Not on file    Family History  Problem Relation Age of Onset   Aneurysm Mother        brain   Hypertension Mother    Cancer Father    Heart disease Father    Diabetes Father    Hypertension Father    Hyperlipidemia Father    Cancer Maternal Grandmother        melanoma   Heart disease Paternal Grandfather    Colon cancer Neg Hx      Review of Systems Constitutional: negative for anorexia, fevers and sweats  Eyes: negative for irritation, redness and visual disturbance  Ears, nose, mouth, throat, and face: negative for earaches, epistaxis, nasal congestion and sore throat  Respiratory: negative for cough, dyspnea on exertion, sputum and wheezing  Cardiovascular: negative for chest pain, dyspnea, lower extremity edema, orthopnea, palpitations and syncope  Gastrointestinal: negative for abdominal pain, constipation, diarrhea, melena, nausea and vomiting   Genitourinary:negative for dysuria, frequency and hematuria  Hematologic/lymphatic: negative for bleeding, easy bruising and lymphadenopathy  Musculoskeletal:negative for arthralgias, muscle weakness and stiff joints  Neurological: negative for coordination problems, gait problems, headaches and weakness  Endocrine: negative for diabetic symptoms including polydipsia, polyuria and weight loss     Objective:   Physical Exam  Gen. Pleasant, obese, in no distress, normal affect ENT - no pallor,icterus, no post nasal drip, class 2 airway Neck: No JVD, no thyromegaly, no carotid bruits Lungs: no use of accessory muscles, no dullness to percussion, decreased without rales or rhonchi  Cardiovascular: Rhythm regular, heart sounds  normal, no murmurs or gallops, no peripheral edema Abdomen: soft and non-tender, no hepatosplenomegaly, BS normal. Musculoskeletal: No deformities, no cyanosis or clubbing Neuro:  alert, non focal, no tremors       Assessment & Plan:

## 2022-12-28 NOTE — Telephone Encounter (Signed)
I spoke with wife on 12/27/2022 and answered her surgery questions. Wife asked about recovery time- surgery is scheduled with [facility]- informed wife to discuss with husband his surgery questions and reach out to [facility] and the surgery and recovery time.  Wife voiced understanding.

## 2022-12-28 NOTE — Assessment & Plan Note (Signed)
It is very likely that Herson has OSA.  It seems he had a diagnosis few years ago but could not tolerate CPAP therapy.  It is obvious that he has OSA during supine sleep, he reports choking and gasping during sleep and hence never sleeps on his back.  What I would like to see is whether he has significant sleep disordered breathing when he is not supine.  We will schedule home sleep test so that he can sleep in his own bed and what ever position he prefers. We discussed treatment including CPAP therapy and he is willing to trial nasal pillows or nasal mask.  He feels he will be claustrophobic with a fullface mask  Given excessive daytime somnolence, narrow pharyngeal exam, witnessed apneas & loud snoring, obstructive sleep apnea is very likely & an overnight polysomnogram will be scheduled as a home study. The pathophysiology of obstructive sleep apnea , it's cardiovascular consequences & modes of treatment including CPAP were discused with the patient in detail & they evidenced understanding.

## 2022-12-28 NOTE — Patient Instructions (Signed)
X Home sleep test

## 2022-12-31 ENCOUNTER — Telehealth: Payer: Self-pay | Admitting: "Endocrinology

## 2022-12-31 ENCOUNTER — Other Ambulatory Visit: Payer: Self-pay | Admitting: "Endocrinology

## 2022-12-31 ENCOUNTER — Telehealth: Payer: Self-pay

## 2022-12-31 DIAGNOSIS — N183 Chronic kidney disease, stage 3 unspecified: Secondary | ICD-10-CM

## 2022-12-31 MED ORDER — LANTUS SOLOSTAR 100 UNIT/ML ~~LOC~~ SOPN: 60.0000 [IU] | PEN_INJECTOR | Freq: Every day | SUBCUTANEOUS | 0 refills | Status: DC

## 2022-12-31 MED ORDER — LANTUS SOLOSTAR 100 UNIT/ML ~~LOC~~ SOPN: 60.0000 [IU] | PEN_INJECTOR | Freq: Every day | SUBCUTANEOUS | 1 refills | Status: DC

## 2022-12-31 NOTE — Telephone Encounter (Signed)
Rx for lantus sent to [facility].

## 2022-12-31 NOTE — Telephone Encounter (Signed)
Pts wife states [facility] has not received prescription for Lantus.

## 2022-12-31 NOTE — Telephone Encounter (Signed)
Patient's wife called to see how long pt will be out of work after his procedure on 02/26.  Can you return her call [phone] when you are available.

## 2023-01-04 ENCOUNTER — Ambulatory Visit: Payer: BC Managed Care – PPO | Admitting: Internal Medicine

## 2023-01-04 ENCOUNTER — Other Ambulatory Visit: Payer: Self-pay

## 2023-01-04 DIAGNOSIS — E1122 Type 2 diabetes mellitus with diabetic chronic kidney disease: Secondary | ICD-10-CM

## 2023-01-04 MED ORDER — LANTUS SOLOSTAR 100 UNIT/ML ~~LOC~~ SOPN: 60.0000 [IU] | PEN_INJECTOR | Freq: Every day | SUBCUTANEOUS | 0 refills | Status: DC

## 2023-01-06 ENCOUNTER — Encounter (HOSPITAL_BASED_OUTPATIENT_CLINIC_OR_DEPARTMENT_OTHER): Payer: Self-pay | Admitting: Urology

## 2023-01-06 NOTE — Progress Notes (Addendum)
Spoke w/ via phone for pre-op interview--- pt's wife, Brian Nielsen Lab needs dos---- [employer], ekg              Lab results------ no COVID test -----patient states asymptomatic no test needed Arrive at ------- 0630 on 01-10-2023 NPO after MN NO Solid Food and no dip tobacco.  Clear liquids from MN until--- 0530 Med rec completed Medications to take morning of surgery ----- gabapentin, lipitor, clonidine, coreg, protoinx Diabetic medication ----- do half dose of lantus insulin night before surgery Patient instructed no nail polish to be worn day of surgery Patient instructed to bring photo id and insurance card day of surgery Patient aware to have Driver (ride ) / caregiver    for 24 hours after surgery -- wife, Brian Nielsen Patient Special Instructions ----- pt was aware not to do mounjaro week prior to surgery, last dose Sunday 01-02-2023 Pre-Op special Istructions ----- n/a Patient's wife verbalized understanding of instructions that were given at this phone interview. Patient's wife stated pt denies shortness of breath, chest pain, fever, cough at this phone interview.

## 2023-01-09 NOTE — Anesthesia Preprocedure Evaluation (Signed)
Anesthesia Evaluation  Patient identified by MRN, date of birth, ID band Patient awake    Reviewed: Allergy & Precautions, H&P , NPO status , Patient's Chart, lab work & pertinent test results  Airway Mallampati: II  TM Distance: >3 FB Neck ROM: Full    Dental no notable dental hx. (+) Teeth Intact, Dental Advisory Given   Pulmonary neg pulmonary ROS   Pulmonary exam normal breath sounds clear to auscultation       Cardiovascular hypertension, negative cardio ROS Normal cardiovascular exam Rhythm:Regular Rate:Normal     Neuro/Psych negative neurological ROS  negative psych ROS   GI/Hepatic negative GI ROS, Neg liver ROS,,,  Endo/Other  negative endocrine ROSdiabetes, Type 2    Renal/GU Renal InsufficiencyRenal diseasenegative Renal ROS  negative genitourinary   Musculoskeletal negative musculoskeletal ROS (+) Arthritis ,    Abdominal   Peds negative pediatric ROS (+)  Hematology negative hematology ROS (+)   Anesthesia Other Findings All: Duloxetine, Codeine  Reproductive/Obstetrics negative OB ROS                             Anesthesia Physical Anesthesia Plan  ASA: 3  Anesthesia Plan: General   Post-op Pain Management: Precedex and Tylenol PO (pre-op)*   Induction: Intravenous  PONV Risk Score and Plan: 3 and Treatment may vary due to age or medical condition, Midazolam, Dexamethasone and Ondansetron  Airway Management Planned: LMA  Additional Equipment:   Intra-op Plan:   Post-operative Plan: Extubation in OR  Informed Consent: I have reviewed the patients History and Physical, chart, labs and discussed the procedure including the risks, benefits and alternatives for the proposed anesthesia with the patient or authorized representative who has indicated his/her understanding and acceptance.     Dental advisory given  Plan Discussed with:   Anesthesia Plan Comments:  (On GLP1 Check last dose)        Anesthesia Quick Evaluation

## 2023-01-10 ENCOUNTER — Ambulatory Visit (HOSPITAL_BASED_OUTPATIENT_CLINIC_OR_DEPARTMENT_OTHER): Payer: BC Managed Care – PPO | Admitting: Anesthesiology

## 2023-01-10 ENCOUNTER — Encounter (HOSPITAL_BASED_OUTPATIENT_CLINIC_OR_DEPARTMENT_OTHER)
Admission: RE | Disposition: A | Discharge status: Home / Self Care | Payer: Self-pay | Source: Home / Self Care | Attending: Urology

## 2023-01-10 ENCOUNTER — Ambulatory Visit (HOSPITAL_BASED_OUTPATIENT_CLINIC_OR_DEPARTMENT_OTHER)
Admission: RE | Admit: 2023-01-10 | Discharge: 2023-01-10 | Disposition: A | Discharge status: Home / Self Care | Payer: BC Managed Care – PPO | Attending: Urology | Admitting: Urology

## 2023-01-10 ENCOUNTER — Encounter (HOSPITAL_BASED_OUTPATIENT_CLINIC_OR_DEPARTMENT_OTHER): Payer: Self-pay | Admitting: Urology

## 2023-01-10 DIAGNOSIS — N183 Chronic kidney disease, stage 3 unspecified: Secondary | ICD-10-CM | POA: Insufficient documentation

## 2023-01-10 DIAGNOSIS — E11610 Type 2 diabetes mellitus with diabetic neuropathic arthropathy: Secondary | ICD-10-CM | POA: Insufficient documentation

## 2023-01-10 DIAGNOSIS — E782 Mixed hyperlipidemia: Secondary | ICD-10-CM | POA: Diagnosis not present

## 2023-01-10 DIAGNOSIS — N35911 Unspecified urethral stricture, male, meatal: Secondary | ICD-10-CM | POA: Diagnosis present

## 2023-01-10 DIAGNOSIS — N35919 Unspecified urethral stricture, male, unspecified site: Secondary | ICD-10-CM

## 2023-01-10 DIAGNOSIS — K219 Gastro-esophageal reflux disease without esophagitis: Secondary | ICD-10-CM | POA: Insufficient documentation

## 2023-01-10 DIAGNOSIS — E1122 Type 2 diabetes mellitus with diabetic chronic kidney disease: Secondary | ICD-10-CM | POA: Diagnosis not present

## 2023-01-10 DIAGNOSIS — I129 Hypertensive chronic kidney disease with stage 1 through stage 4 chronic kidney disease, or unspecified chronic kidney disease: Secondary | ICD-10-CM | POA: Diagnosis not present

## 2023-01-10 DIAGNOSIS — Z01818 Encounter for other preprocedural examination: Secondary | ICD-10-CM

## 2023-01-10 HISTORY — DX: Type 2 diabetes mellitus without complications: E11.9

## 2023-01-10 HISTORY — DX: Type 2 diabetes mellitus with diabetic neuropathic arthropathy: E11.610

## 2023-01-10 HISTORY — DX: Personal history of adenomatous and serrated colon polyps: Z86.0101

## 2023-01-10 HISTORY — DX: Localized edema: R60.0

## 2023-01-10 HISTORY — DX: Benign prostatic hyperplasia with lower urinary tract symptoms: N40.1

## 2023-01-10 HISTORY — DX: Chronic kidney disease, stage 3 unspecified: N18.30

## 2023-01-10 HISTORY — DX: Diverticulosis of large intestine without perforation or abscess without bleeding: K57.30

## 2023-01-10 HISTORY — DX: Vitamin D deficiency, unspecified: E55.9

## 2023-01-10 HISTORY — DX: Presence of external hearing-aid: Z97.4

## 2023-01-10 HISTORY — DX: Post-traumatic urethral stricture, male, meatal: N35.010

## 2023-01-10 HISTORY — DX: Mixed hyperlipidemia: E78.2

## 2023-01-10 HISTORY — DX: Induration penis plastica: N48.6

## 2023-01-10 HISTORY — PX: CYSTOSCOPY WITH URETHRAL DILATATION: SHX5125

## 2023-01-10 HISTORY — DX: Polyneuropathy, unspecified: G62.9

## 2023-01-10 HISTORY — PX: MEATOTOMY: SHX5133

## 2023-01-10 HISTORY — DX: Other constipation: K59.09

## 2023-01-10 HISTORY — DX: Other specified diabetes mellitus with unspecified diabetic retinopathy without macular edema: E13.319

## 2023-01-10 HISTORY — DX: Anemia in chronic kidney disease: D63.1

## 2023-01-10 HISTORY — DX: Personal history of colonic polyps: Z86.010

## 2023-01-10 LAB — POCT I-STAT, CHEM 8
BUN: 48 mg/dL — ABNORMAL HIGH (ref 8–23)
Calcium, Ion: 1.35 mmol/L (ref 1.15–1.40)
Chloride: 105 mmol/L (ref 98–111)
Creatinine, Ser: 3.1 mg/dL — ABNORMAL HIGH (ref 0.61–1.24)
Glucose, Bld: 174 mg/dL — ABNORMAL HIGH (ref 70–99)
HCT: 32 % — ABNORMAL LOW (ref 39.0–52.0)
Hemoglobin: 10.9 g/dL — ABNORMAL LOW (ref 13.0–17.0)
Potassium: 4.3 mmol/L (ref 3.5–5.1)
Sodium: 140 mmol/L (ref 135–145)
TCO2: 24 mmol/L (ref 22–32)

## 2023-01-10 LAB — GLUCOSE, CAPILLARY: Glucose-Capillary: 130 mg/dL — ABNORMAL HIGH (ref 70–99)

## 2023-01-10 SURGERY — MEATOTOMY, URETHRA, ADULT
Anesthesia: General | Site: Urethra

## 2023-01-10 MED ORDER — OXYCODONE HCL 5 MG PO TABS: 5.0000 mg | ORAL_TABLET | Freq: Once | ORAL | Status: DC | PRN

## 2023-01-10 MED ORDER — ONDANSETRON HCL 4 MG/2ML IJ SOLN
INTRAMUSCULAR | Status: AC
  Filled 2023-01-10: qty 2

## 2023-01-10 MED ORDER — ONDANSETRON HCL 4 MG/2ML IJ SOLN: 4.0000 mg | Freq: Once | INTRAMUSCULAR | Status: DC | PRN

## 2023-01-10 MED ORDER — PROPOFOL 10 MG/ML IV BOLUS
INTRAVENOUS | Status: DC | PRN
  Administered 2023-01-10: 160 mg via INTRAVENOUS
  Administered 2023-01-10: 40 mg via INTRAVENOUS

## 2023-01-10 MED ORDER — FENTANYL CITRATE (PF) 100 MCG/2ML IJ SOLN
INTRAMUSCULAR | Status: AC
  Filled 2023-01-10: qty 2

## 2023-01-10 MED ORDER — SODIUM CHLORIDE 0.9 % IR SOLN
Status: DC | PRN
  Administered 2023-01-10: 3000 mL via INTRAVESICAL

## 2023-01-10 MED ORDER — ONDANSETRON HCL 4 MG/2ML IJ SOLN
INTRAMUSCULAR | Status: DC | PRN
  Administered 2023-01-10: 4 mg via INTRAVENOUS

## 2023-01-10 MED ORDER — ARTIFICIAL TEARS OPHTHALMIC OINT
TOPICAL_OINTMENT | OPHTHALMIC | Status: AC
  Filled 2023-01-10: qty 3.5

## 2023-01-10 MED ORDER — DEXAMETHASONE SODIUM PHOSPHATE 10 MG/ML IJ SOLN
INTRAMUSCULAR | Status: AC
  Filled 2023-01-10: qty 1

## 2023-01-10 MED ORDER — LIDOCAINE HCL (PF) 2 % IJ SOLN
INTRAMUSCULAR | Status: AC
  Filled 2023-01-10: qty 5

## 2023-01-10 MED ORDER — PROPOFOL 10 MG/ML IV BOLUS
INTRAVENOUS | Status: AC
  Filled 2023-01-10: qty 20

## 2023-01-10 MED ORDER — BACITRACIN-NEOMYCIN-POLYMYXIN OINTMENT TUBE
TOPICAL_OINTMENT | CUTANEOUS | Status: DC | PRN
  Administered 2023-01-10: 1 via TOPICAL

## 2023-01-10 MED ORDER — SODIUM CHLORIDE 0.9 % IV SOLN: INTRAVENOUS | Status: DC

## 2023-01-10 MED ORDER — EPHEDRINE 5 MG/ML INJ
INTRAVENOUS | Status: AC
  Filled 2023-01-10: qty 5

## 2023-01-10 MED ORDER — GLYCOPYRROLATE PF 0.2 MG/ML IJ SOSY
PREFILLED_SYRINGE | INTRAMUSCULAR | Status: AC
  Filled 2023-01-10: qty 1

## 2023-01-10 MED ORDER — OXYCODONE HCL 5 MG/5ML PO SOLN: 5.0000 mg | Freq: Once | ORAL | Status: DC | PRN

## 2023-01-10 MED ORDER — HYDROMORPHONE HCL 1 MG/ML IJ SOLN: 0.2500 mg | INTRAMUSCULAR | Status: DC | PRN

## 2023-01-10 MED ORDER — MIDAZOLAM HCL 2 MG/2ML IJ SOLN
INTRAMUSCULAR | Status: AC
  Filled 2023-01-10: qty 2

## 2023-01-10 MED ORDER — GLYCOPYRROLATE PF 0.2 MG/ML IJ SOSY
PREFILLED_SYRINGE | INTRAMUSCULAR | Status: DC | PRN
  Administered 2023-01-10: .1 mg via INTRAVENOUS

## 2023-01-10 MED ORDER — BUPIVACAINE HCL 0.25 % IJ SOLN
INTRAMUSCULAR | Status: DC | PRN
  Administered 2023-01-10: 10 mL

## 2023-01-10 MED ORDER — EPHEDRINE SULFATE-NACL 50-0.9 MG/10ML-% IV SOSY
PREFILLED_SYRINGE | INTRAVENOUS | Status: DC | PRN
  Administered 2023-01-10: 5 mg via INTRAVENOUS
  Administered 2023-01-10 (×2): 10 mg via INTRAVENOUS

## 2023-01-10 MED ORDER — DEXAMETHASONE SODIUM PHOSPHATE 10 MG/ML IJ SOLN
INTRAMUSCULAR | Status: DC | PRN
  Administered 2023-01-10: 5 mg via INTRAVENOUS

## 2023-01-10 MED ORDER — LIDOCAINE 2% (20 MG/ML) 5 ML SYRINGE
INTRAMUSCULAR | Status: DC | PRN
  Administered 2023-01-10: 100 mg via INTRAVENOUS

## 2023-01-10 MED ORDER — CEFAZOLIN SODIUM-DEXTROSE 2-4 GM/100ML-% IV SOLN
INTRAVENOUS | Status: AC
  Filled 2023-01-10: qty 100

## 2023-01-10 MED ORDER — CEFAZOLIN SODIUM-DEXTROSE 2-4 GM/100ML-% IV SOLN
2.0000 g | INTRAVENOUS | Status: AC
  Administered 2023-01-10: 2 g via INTRAVENOUS

## 2023-01-10 MED ORDER — FENTANYL CITRATE (PF) 100 MCG/2ML IJ SOLN
INTRAMUSCULAR | Status: DC | PRN
  Administered 2023-01-10 (×2): 25 ug via INTRAVENOUS

## 2023-01-10 SURGICAL SUPPLY — 54 items
BAG DRAIN URO-CYSTO SKYTR STRL (DRAIN) ×2 IMPLANT
BAG DRN UROCATH (DRAIN) ×2
BAG URINE LEG 500ML (DRAIN) IMPLANT
BALLN NEPHROSTOMY (BALLOONS)
BALLN OPTILUME DCB 30X3X75 (BALLOONS)
BALLN OPTILUME DCB 30X5X75 (BALLOONS)
BALLOON NEPHROSTOMY (BALLOONS) IMPLANT
BALLOON OPTILUME DCB 30X3X75 (BALLOONS) IMPLANT
BALLOON OPTILUME DCB 30X5X75 (BALLOONS) IMPLANT
BLADE SURG 15 STRL LF DISP TIS (BLADE) IMPLANT
BLADE SURG 15 STRL SS (BLADE)
BLANKET WARM UPPER BOD BAIR (MISCELLANEOUS) ×2 IMPLANT
BNDG CMPR 75X21 PLY HI ABS (MISCELLANEOUS)
BNDG COHESIVE 1X5 TAN STRL LF (GAUZE/BANDAGES/DRESSINGS) ×2 IMPLANT
CATH FOLEY 2WAY SLVR  5CC 20FR (CATHETERS)
CATH FOLEY 2WAY SLVR  5CC 22FR (CATHETERS)
CATH FOLEY 2WAY SLVR 5CC 20FR (CATHETERS) IMPLANT
CATH FOLEY 2WAY SLVR 5CC 22FR (CATHETERS) IMPLANT
CATH URETL OPEN END 6FR 70 (CATHETERS) IMPLANT
CLOTH BEACON ORANGE TIMEOUT ST (SAFETY) ×4 IMPLANT
COVER BACK TABLE 60X90IN (DRAPES) ×2 IMPLANT
COVER MAYO STAND STRL (DRAPES) ×2 IMPLANT
DRAPE LAPAROTOMY 100X72 PEDS (DRAPES) ×2 IMPLANT
ELECT NDL TIP 2.8 STRL (NEEDLE) IMPLANT
ELECT NEEDLE TIP 2.8 STRL (NEEDLE) IMPLANT
ELECT REM PT RETURN 9FT ADLT (ELECTROSURGICAL) ×2
ELECTRODE REM PT RTRN 9FT ADLT (ELECTROSURGICAL) ×2 IMPLANT
GAUZE 4X4 16PLY ~~LOC~~+RFID DBL (SPONGE) IMPLANT
GAUZE PETROLATUM 1 X8 (GAUZE/BANDAGES/DRESSINGS) IMPLANT
GAUZE SPONGE 4X4 12PLY STRL (GAUZE/BANDAGES/DRESSINGS) IMPLANT
GAUZE STRETCH 2X75IN STRL (MISCELLANEOUS) ×2 IMPLANT
GLOVE BIO SURGEON STRL SZ 6 (GLOVE) IMPLANT
GLOVE BIO SURGEON STRL SZ8 (GLOVE) ×2 IMPLANT
GLOVE BIOGEL PI IND STRL 6 (GLOVE) IMPLANT
GOWN STRL REUS W/TWL XL LVL3 (GOWN DISPOSABLE) ×4 IMPLANT
GUIDEWIRE ANG ZIPWIRE 038X150 (WIRE) IMPLANT
GUIDEWIRE STR DUAL SENSOR (WIRE) IMPLANT
IV NS IRRIG 3000ML ARTHROMATIC (IV SOLUTION) IMPLANT
KIT TURNOVER CYSTO (KITS) ×2 IMPLANT
MANIFOLD NEPTUNE II (INSTRUMENTS) ×2 IMPLANT
NDL HYPO 22X1.5 SAFETY MO (MISCELLANEOUS) ×2 IMPLANT
NEEDLE HYPO 22X1.5 SAFETY MO (MISCELLANEOUS) ×2 IMPLANT
NEEDLE SAFETY HYPO 22GAX1.5 (MISCELLANEOUS) ×2
NS IRRIG 500ML POUR BTL (IV SOLUTION) ×2 IMPLANT
PACK BASIN DAY SURGERY FS (CUSTOM PROCEDURE TRAY) ×2 IMPLANT
PACK CYSTO (CUSTOM PROCEDURE TRAY) ×2 IMPLANT
PENCIL SMOKE EVACUATOR (MISCELLANEOUS) IMPLANT
SLEEVE SCD COMPRESS KNEE MED (STOCKING) ×2 IMPLANT
SUT CHROMIC 4 0 RB 1X27 (SUTURE) IMPLANT
SUT CHROMIC 5 0 RB 1 27 (SUTURE) IMPLANT
SYR CONTROL 10ML LL (SYRINGE) ×2 IMPLANT
TOWEL OR 17X24 6PK STRL BLUE (TOWEL DISPOSABLE) ×2 IMPLANT
TRAY DSU PREP LF (CUSTOM PROCEDURE TRAY) ×2 IMPLANT
TUBE CONNECTING 12X1/4 (SUCTIONS) IMPLANT

## 2023-01-10 NOTE — H&P (Signed)
H&P  Chief Complaint: Narrowing  History of Present Illness: 62 year old male presents at this time for meatotomy and possible Optilume management of urethral strictures-meatal stenosis and distal urethral stricture.  Past Medical History:  Diagnosis Date   Adult celiac disease 2015   followed by dr Allen Norris (GI)   Anemia associated with chronic renal failure    hematologist--- dr Sharen Heck;  treated with iron infusions   Benign localized prostatic hyperplasia with lower urinary tract symptoms (LUTS)    urologist--- dr Diona Fanti   Charcot foot due to diabetes mellitus ([place])    Chronic constipation    CKD (chronic kidney disease), stage III [facility])    nephrologist--- dr Theador Hawthorne;  renal bx 11-20-2021   Diverticulosis of colon    Edema of both lower extremities    GERD (gastroesophageal reflux disease)    History of adenomatous polyp of colon    HTN (hypertension)    Hyperlipidemia, mixed    Peripheral neuropathy    Peyronie's disease    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 meatal stricture    Retinopathy due to secondary diabetes mellitus ([place])    both eyes  treated with injecitons   Thoracic spondylosis without myelopathy 11/22/2016   Type 2 diabetes mellitus [facility])    endocrinologist--- dr nida   Vitamin D deficiency    Wears hearing aid in both ears     Past Surgical History:  Procedure Laterality Date   BIOPSY N/A 09/07/2021   Procedure: BIOPSY;  Surgeon: Lucilla Lame, MD;  Location: [place];  Service: Endoscopy;  Laterality: N/A;   CATARACT EXTRACTION W/ INTRAOCULAR LENS IMPLANT Bilateral 2023   COLONOSCOPY N/A 05/03/2014   Procedure: COLONOSCOPY;  Surgeon: Danie Binder, MD;  [facility];  Service: Endoscopy;  Laterality: N/A;  12:00   COLONOSCOPY WITH PROPOFOL N/A 09/07/2021   Procedure: COLONOSCOPY WITH PROPOFOL;  Surgeon: Lucilla Lame, MD;  Location: [place];  Service: Endoscopy;  Laterality: N/A;   ESOPHAGOGASTRODUODENOSCOPY N/A 05/03/2014    Procedure: ESOPHAGOGASTRODUODENOSCOPY (EGD);  Surgeon: Danie Binder, MD;  [facility];  Service: Endoscopy;  Laterality: N/A;   ESOPHAGOGASTRODUODENOSCOPY (EGD) WITH PROPOFOL N/A 06/03/2020   Procedure: ESOPHAGOGASTRODUODENOSCOPY (EGD) WITH PROPOFOL;  Surgeon: [name], MD;  Location: [facility];  Service: Endoscopy;  Laterality: N/A;   ESOPHAGOGASTRODUODENOSCOPY (EGD) WITH PROPOFOL N/A 09/07/2021   Procedure: ESOPHAGOGASTRODUODENOSCOPY (EGD) WITH PROPOFOL;  Surgeon: Lucilla Lame, MD;  Location: [place];  Service: Endoscopy;  Laterality: N/A;  Diabetic   PARS PLANA VITRECTOMY Right 03/21/2021   Procedure: PARS PLANA VITRECTOMY 25 GAUGE WITH INJECTION OF ANTIBIOTICS FOR ENDOPHTHALMITIS;  Surgeon: Jalene Mullet, MD;  Location: [place];  Service: Ophthalmology;  Laterality: Right;   POLYPECTOMY N/A 09/07/2021   Procedure: POLYPECTOMY;  Surgeon: Lucilla Lame, MD;  Location: [place];  Service: Endoscopy;  Laterality: N/A;   REPAIR EXTENSOR TENDON Right 08/11/2022   Procedure: EXTENSOR CARPI ULNARIS TENDON DEBRIDEMENT, POSSIBLE SUBSHEATH RECONSTRUCTION;  Surgeon: Sherilyn Cooter, MD;  Location: [place];  Service: Orthopedics;  Laterality: Right;  or regional plus MAC    Home Medications:    Allergies:  Allergies  Allergen Reactions   Duloxetine Other (See Comments)    "disoriented, confused, couldn't drive"   Gluten Meal     Celiac Disease   Codeine Rash    Family History  Problem Relation Age of Onset   Aneurysm Mother        brain   Hypertension Mother    Cancer Father  Heart disease Father    Diabetes Father    Hypertension Father    Hyperlipidemia Father    Cancer Maternal Grandmother        melanoma   Heart disease Paternal Grandfather    Colon cancer Neg Hx     Social History:  reports that he has never smoked. His smokeless tobacco use includes snuff. He reports that he does not currently use  alcohol. He reports that he does not use drugs.  ROS: A complete review of systems was performed.  All systems are negative except for pertinent findings as noted.  Physical Exam:  Vital signs in last 24 hours: BP 113/63   Pulse (!) 54   Temp 98.1 F (36.7 C) (Oral)   Resp 18   Ht '5\' 8"'$  (1.727 m)   Wt 92.8 kg   SpO2 97%   BMI 31.11 kg/m  Constitutional:  Alert and oriented, No acute distress Cardiovascular: Regular rate  Respiratory: Normal respiratory effort GI: Abdomen is soft, nontender, nondistended, no abdominal masses. No CVAT.  Genitourinary: Normal male phallus, testes are descended bilaterally and non-tender and without masses, scrotum is normal in appearance without lesions or masses, perineum is normal on inspection. Lymphatic: No lymphadenopathy Neurologic: Grossly intact, no focal deficits Psychiatric: Normal mood and affect  I have reviewed prior pt notes  I have reviewed notes from referring/previous physicians  I have reviewed urinalysis results  I have independently reviewed prior imaging  I have reviewed prior PSA results  I have reviewed prior urine culture   Impression/Assessment:  Meatal stenosis/distal urethral strictures  Plan:  Meatotomy, possible Optilume management of other urethral strictures

## 2023-01-10 NOTE — Interval H&P Note (Signed)
History and Physical Interval Note:  01/10/2023 8:29 AM  Brian Nielsen  has presented today for surgery, with the diagnosis of URETHRAL STRICTURE.  The various methods of treatment have been discussed with the patient and family. After consideration of risks, benefits and other options for treatment, the patient has consented to  Procedure(s) with comments: MEATOTOMY ADULT (N/A) - 1 HR CYSTOSCOPY WITH POSSIBLE OPTILUME URETHRAL DILATATION (N/A) as a surgical intervention.  The patient's history has been reviewed, patient examined, no change in status, stable for surgery.  I have reviewed the patient's chart and labs.  Questions were answered to the patient's satisfaction.     Lillette Boxer Gunner Iodice

## 2023-01-10 NOTE — Discharge Instructions (Addendum)
Keep Neosporin on the surgical site twice a day for the next few days  Keep clean gauze or clean washcloth over top of the area for the next couple of days, 70 undershorts  Call your surgeon if you experience:   1.  Fever over 101.0. 2.  Inability to urinate. 3.  Nausea and/or vomiting. 4.  Extreme swelling or bruising at the surgical site. 5.  Continued bleeding from the incision. 6.  Increased pain, redness or drainage from the incision. 7.  Problems related to your pain medication. 8.  Any problems and/or concerns  Post Anesthesia Home Care Instructions  Activity: Get plenty of rest for the remainder of the day. A responsible individual must stay with you for 24 hours following the procedure.  For the next 24 hours, DO NOT: -Drive a car -Paediatric nurse -Drink alcoholic beverages -Take any medication unless instructed by your physician -Make any legal decisions or sign important papers.  Meals: Start with liquid foods such as gelatin or soup. Progress to regular foods as tolerated. Avoid greasy, spicy, heavy foods. If nausea and/or vomiting occur, drink only clear liquids until the nausea and/or vomiting subsides. Call your physician if vomiting continues.  Special Instructions/Symptoms: Your throat may feel dry or sore from the anesthesia or the breathing tube placed in your throat during surgery. If this causes discomfort, gargle with warm salt water. The discomfort should disappear within 24 hours.  If you had a scopolamine patch placed behind your ear for the management of post- operative nausea and/or vomiting:  1. The medication in the patch is effective for 72 hours, after which it should be removed.  Wrap patch in a tissue and discard in the trash. Wash hands thoroughly with soap and water. 2. You may remove the patch earlier than 72 hours if you experience unpleasant side effects which may include dry mouth, dizziness or visual disturbances. 3. Avoid touching the  patch. Wash your hands with soap and water after contact with the patch.

## 2023-01-10 NOTE — Anesthesia Postprocedure Evaluation (Signed)
Anesthesia Post Note  Patient: Brian Nielsen  Procedure(s) Performed: MEATOTOMY ADULT (Penis) CYSTOSCOPY (Urethra)     Patient location during evaluation: PACU Anesthesia Type: General Level of consciousness: awake and alert Pain management: pain level controlled Vital Signs Assessment: post-procedure vital signs reviewed and stable Respiratory status: spontaneous breathing, nonlabored ventilation, respiratory function stable and patient connected to nasal cannula oxygen Cardiovascular status: blood pressure returned to baseline and stable Postop Assessment: no apparent nausea or vomiting Anesthetic complications: no  No notable events documented.  Last Vitals:  Vitals:   01/10/23 0930 01/10/23 0940  BP: (!) 149/64 (!) 153/73  Pulse: (!) 57 (!) 52  Resp: 17 15  Temp:    SpO2: 99% 95%    Last Pain:  Vitals:   01/10/23 0940  TempSrc:   PainSc: 0-No pain                 Barnet Glasgow

## 2023-01-10 NOTE — Op Note (Signed)
Preoperative diagnosis: Meatal stenosis, possible urethral stricture  Postoperative diagnosis: Same  Procedure: Meatotomy, cystoscopy  Surgeon: Dare Spillman  Anesthesia: General with LMA/penile block  Complications: None  Specimen: None  Estimated blood loss: None  Indications: 62 year old male with significant lower urinary tract symptoms secondary to meatal stenosis.  He has not responded to simple dilatation.  He presents at this time for meatotomy.  If other urethral stricture is noted, I told him we could perform the Optilume procedure.  The procedure, risks and complications are known to the patient.  He understands and desires to proceed.  Findings: He had moderate to severe meatal stenosis, perhaps 8 [place] in size.  Cystoscopically, no other urethral strictures were noted.  Prostate was nonobstructive.  Bladder displayed mild trabeculations, no foreign bodies, no tumors.  Ureteral orifices were normal in configuration and location.  Description of procedure: Patient properly identified in the holding area and brought to the operating room where general anesthetic was administered with the LMA.  He is placed in the dorsolithotomy position.  Genitalia and perineum were prepped, draped, proper timeout performed.  The ventral meatus was doubly clamped with hemostats for approximately 8 mm.  These were placed side-by-side.  The right sided hemostat was then removed.  I then closed the crest incision with 5-0 chromic placed in a simple interrupted fashion.  Similar procedure done once the left hemostat was removed.  At this point, there was a nice closure and the meatus readily admitted a 22 French cystoscope.  This was used to inspect the urethra and bladder which was normal as above.  8 cc of quarter percent plain Marcaine was used for dorsal penile block.  At this point, the procedure was terminated.  I placed Neosporin on the meatus, I then placed gauze sponges over top of this and mesh  underpants were placed.  The patient was then awakened and taken to the PACU in stable condition, having tolerated the procedure well.`

## 2023-01-10 NOTE — Telephone Encounter (Signed)
Surgery through [facility]- wife instructed to call [facility] to discuss surgery details.

## 2023-01-10 NOTE — Anesthesia Procedure Notes (Signed)
Procedure Name: LMA Insertion Date/Time: 01/10/2023 8:40 AM  Performed by: Rogers Blocker, CRNAPre-anesthesia Checklist: Patient identified, Emergency Drugs available, Suction available and Patient being monitored Patient Re-evaluated:Patient Re-evaluated prior to induction Oxygen Delivery Method: Circle System Utilized Preoxygenation: Pre-oxygenation with 100% oxygen Induction Type: IV induction Ventilation: Mask ventilation without difficulty LMA: LMA inserted LMA Size: 5.0 Number of attempts: 1 Airway Equipment and Method: Bite block Placement Confirmation: positive ETCO2 Tube secured with: Tape Dental Injury: Teeth and Oropharynx as per pre-operative assessment

## 2023-01-10 NOTE — Transfer of Care (Signed)
Immediate Anesthesia Transfer of Care Note  Patient: Brian Nielsen  Procedure(s) Performed: MEATOTOMY ADULT (Penis) CYSTOSCOPY (Urethra)  Patient Location: PACU  Anesthesia Type:General  Level of Consciousness: awake, alert , oriented, and patient cooperative  Airway & Oxygen Therapy: Patient Spontanous Breathing  Post-op Assessment: Report given to RN and Post -op Vital signs reviewed and stable  Post vital signs: Reviewed and stable  Last Vitals:  Vitals Value Taken Time  BP 137/60 01/10/23 0916  Temp    Pulse 55 01/10/23 0918  Resp 15 01/10/23 0918  SpO2 97 % 01/10/23 0918  Vitals shown include unvalidated device data.  Last Pain:  Vitals:   01/10/23 0654  TempSrc: Oral  PainSc: 5       Patients Stated Pain Goal: 1 (0000000 [id number])  Complications: No notable events documented.

## 2023-01-11 ENCOUNTER — Encounter (HOSPITAL_BASED_OUTPATIENT_CLINIC_OR_DEPARTMENT_OTHER): Payer: Self-pay | Admitting: Urology

## 2023-01-12 ENCOUNTER — Inpatient Hospital Stay (HOSPITAL_BASED_OUTPATIENT_CLINIC_OR_DEPARTMENT_OTHER): Payer: BC Managed Care – PPO | Admitting: Internal Medicine

## 2023-01-12 ENCOUNTER — Inpatient Hospital Stay: Payer: BC Managed Care – PPO

## 2023-01-12 ENCOUNTER — Encounter: Payer: Self-pay | Admitting: Internal Medicine

## 2023-01-12 ENCOUNTER — Inpatient Hospital Stay: Payer: BC Managed Care – PPO | Attending: Internal Medicine

## 2023-01-12 VITALS — BP 139/64 | HR 66 | Temp 97.6°F | Resp 16 | Wt 205.0 lb

## 2023-01-12 VITALS — BP 150/80 | HR 68

## 2023-01-12 DIAGNOSIS — D649 Anemia, unspecified: Secondary | ICD-10-CM

## 2023-01-12 LAB — CBC WITH DIFFERENTIAL/PLATELET
Abs Immature Granulocytes: 0.07 10*3/uL (ref 0.00–0.07)
Basophils Absolute: 0 10*3/uL (ref 0.0–0.1)
Basophils Relative: 0 %
Eosinophils Absolute: 0.1 10*3/uL (ref 0.0–0.5)
Eosinophils Relative: 1 %
HCT: 33.6 % — ABNORMAL LOW (ref 39.0–52.0)
Hemoglobin: 10.7 g/dL — ABNORMAL LOW (ref 13.0–17.0)
Immature Granulocytes: 1 %
Lymphocytes Relative: 11 %
Lymphs Abs: 0.8 10*3/uL (ref 0.7–4.0)
MCH: 28 pg (ref 26.0–34.0)
MCHC: 31.8 g/dL (ref 30.0–36.0)
MCV: 88 fL (ref 80.0–100.0)
Monocytes Absolute: 0.6 10*3/uL (ref 0.1–1.0)
Monocytes Relative: 8 %
Neutro Abs: 5.9 10*3/uL (ref 1.7–7.7)
Neutrophils Relative %: 79 %
Platelets: 230 10*3/uL (ref 150–400)
RBC: 3.82 MIL/uL — ABNORMAL LOW (ref 4.22–5.81)
RDW: 13.9 % (ref 11.5–15.5)
WBC: 7.5 10*3/uL (ref 4.0–10.5)
nRBC: 0 % (ref 0.0–0.2)

## 2023-01-12 LAB — BASIC METABOLIC PANEL
Anion gap: 9 (ref 5–15)
BUN: 57 mg/dL — ABNORMAL HIGH (ref 8–23)
CO2: 23 mmol/L (ref 22–32)
Calcium: 9.2 mg/dL (ref 8.9–10.3)
Chloride: 102 mmol/L (ref 98–111)
Creatinine, Ser: 2.31 mg/dL — ABNORMAL HIGH (ref 0.61–1.24)
GFR, Estimated: 31 mL/min — ABNORMAL LOW (ref >60)
Glucose, Bld: 345 mg/dL — ABNORMAL HIGH (ref 70–99)
Potassium: 4.2 mmol/L (ref 3.5–5.1)
Sodium: 134 mmol/L — ABNORMAL LOW (ref 135–145)

## 2023-01-12 LAB — IRON AND TIBC
Iron: 101 ug/dL (ref 45–182)
Saturation Ratios: 23 % (ref 17.9–39.5)
TIBC: 448 ug/dL (ref 250–450)
UIBC: 347 ug/dL

## 2023-01-12 LAB — FERRITIN: Ferritin: 313 ng/mL (ref 24–336)

## 2023-01-12 MED ORDER — SODIUM CHLORIDE 0.9 % IV SOLN
200.0000 mg | Freq: Once | INTRAVENOUS | Status: AC
  Administered 2023-01-12: 200 mg via INTRAVENOUS
  Filled 2023-01-12: qty 10

## 2023-01-12 MED ORDER — SODIUM CHLORIDE 0.9 % IV SOLN
Freq: Once | INTRAVENOUS | Status: AC
  Filled 2023-01-12: qty 250

## 2023-01-12 NOTE — Assessment & Plan Note (Addendum)
#  Anemia-symptomatic fatigue hemoglobin 9-10-unclear etiology; however suspect multifactorial-iron malabsorption/celiac disease/CKD-stage III- stable.    # Discussed that he will need likely maintenance IV iron every few months.  Also discussed re: epo injections unless hb is less than 10 consistently. Nov 2023-iron saturation 27%; ferritin-239. Current Hb- 10; proceed with IV venofer today.    # Ckd stage III [GFR- 31; nephro, [place]]-diabetes- GFR-31 stable.    # Diabetes [[facility] Hb A1c- 7.4]-PBG 353 [steroid injectio]-continue follow-up with endocrinology/monitoring of blood sugars. Poorly controlled.   # DISPOSITION: # ask lab NOT to add HbA1c  # Venofer today # follow up in 3 months- MD; labs- cbc/bmp;iron studies;ferritin-;possible venofer -Dr.B

## 2023-01-12 NOTE — Patient Instructions (Signed)

## 2023-01-12 NOTE — Progress Notes (Signed)
[facility] CONSULT NOTE  Patient Care Team: Patient, [name] as PCP - General ([place]) [name], NP (Inactive) as Nurse Practitioner (Endocrinology) Cammie Sickle, MD as Consulting Physician (Oncology)  CHIEF COMPLAINTS/PURPOSE OF CONSULTATION: ANEMIA   HEMATOLOGY HISTORY:  # ANEMIA PROGRESSIVE since 2020 12; 2022- 10 MCV- 90s; colonoscopy 2015; multiple endoscopies-last September 2021-biopsy active celiac disease   #CKD stage III [[facility]]/diabetes [[facility]]; ? Celaic disease [[facility]];    - Labs: 07/2014 - tTG IgA 101 03/2020 - positive endomysial Ab IgA, tTG IgA 78 - CSY: 07/2014 - normal appearing terminal ileum, sigmoid diverticulosis, and non-bleeding internal hemorrhoids - EGD: 07/2014 - mild non-erosive gastritis, normal appearing duodenal mucosa with duodenal biopsies showing celiac sprue - EGD: 06/03/2020 - procedure aborted due to presence of food - EGD: 08/12/2020 - irregular Z-line found at [facility] with bx showing mild reflux esophagitis, 1 cm hiatal hernia, mild chronic gastritis, decreased folds found in entire duodenum, flattening found in entire duodenum and scalloped mucosa with bx showing active Celiac disease with villous atrophy, increased intraepithelial lymphocytes, etc. Repeat in 1-year.  HISTORY OF PRESENTING ILLNESS: Alone.  Ambulating independently.  Brian Nielsen 62 y.o.  male symptomatic anemia-multiple comorbidities poorly controlled diabetes-complications of CKD; peripheral neuropathy; anemia is here for follow-up.  Patient continues to complain of pain in right foot.  Pt states had right foot injection yesterday and surgery on Monday for urinary blockage.  Patient continues to complains of chronic fatigue.   No fever no chills. Otherwise no nausea no vomiting.   Review of Systems  Constitutional:  Positive for malaise/fatigue and weight loss. Negative for chills, diaphoresis and fever.  HENT:   Negative for nosebleeds and sore throat.   Eyes:  Negative for double vision.  Respiratory:  Positive for shortness of breath. Negative for cough, hemoptysis, sputum production and wheezing.   Cardiovascular:  Negative for chest pain, palpitations, orthopnea and leg swelling.  Gastrointestinal:  Negative for blood in stool, heartburn, melena, nausea and vomiting.  Genitourinary:  Negative for dysuria, frequency and urgency.  Musculoskeletal:  Positive for back pain and joint pain.  Skin: Negative.  Negative for itching and rash.  Neurological:  Positive for weakness. Negative for dizziness, tingling, focal weakness and headaches.  Endo/Heme/Allergies:  Does not bruise/bleed easily.  Psychiatric/Behavioral:  Negative for depression. The patient is not nervous/anxious and does not have insomnia.     MEDICAL HISTORY:  Past Medical History:  Diagnosis Date   Adult celiac disease 2015   followed by dr Allen Norris (GI)   Anemia associated with chronic renal failure    hematologist--- dr Sharen Heck;  treated with iron infusions   Benign localized prostatic hyperplasia with lower urinary tract symptoms (LUTS)    urologist--- dr Diona Fanti   Charcot foot due to diabetes mellitus ([place])    Chronic constipation    CKD (chronic kidney disease), stage III [facility])    nephrologist--- dr Theador Hawthorne;  renal bx 11-20-2021   Diverticulosis of colon    Edema of both lower extremities    GERD (gastroesophageal reflux disease)    History of adenomatous polyp of colon    HTN (hypertension)    Hyperlipidemia, mixed    Peripheral neuropathy    Peyronie's disease    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 meatal stricture    Retinopathy due to secondary diabetes mellitus ([place])    both eyes  treated with injecitons   Thoracic spondylosis without myelopathy 11/22/2016   Type 2 diabetes mellitus ([place])  endocrinologist--- dr nida   Vitamin D deficiency    Wears hearing aid in both ears     SURGICAL HISTORY: Past Surgical  History:  Procedure Laterality Date   BIOPSY N/A 09/07/2021   Procedure: BIOPSY;  Surgeon: Lucilla Lame, MD;  Location: [place];  Service: Endoscopy;  Laterality: N/A;   CATARACT EXTRACTION W/ INTRAOCULAR LENS IMPLANT Bilateral 2023   COLONOSCOPY N/A 05/03/2014   Procedure: COLONOSCOPY;  Surgeon: Danie Binder, MD;  [facility];  Service: Endoscopy;  Laterality: N/A;  12:00   COLONOSCOPY WITH PROPOFOL N/A 09/07/2021   Procedure: COLONOSCOPY WITH PROPOFOL;  Surgeon: Lucilla Lame, MD;  Location: [place];  Service: Endoscopy;  Laterality: N/A;   CYSTOSCOPY WITH URETHRAL DILATATION N/A 01/10/2023   Procedure: CYSTOSCOPY;  Surgeon: Franchot Gallo, MD;  Location[facility];  Service: Urology;  Laterality: N/A;   ESOPHAGOGASTRODUODENOSCOPY N/A 05/03/2014   Procedure: ESOPHAGOGASTRODUODENOSCOPY (EGD);  Surgeon: Danie Binder, MD;  [facility];  Service: Endoscopy;  Laterality: N/A;   ESOPHAGOGASTRODUODENOSCOPY (EGD) WITH PROPOFOL N/A 06/03/2020   Procedure: ESOPHAGOGASTRODUODENOSCOPY (EGD) WITH PROPOFOL;  Surgeon: [name], MD;  Location: [facility];  Service: Endoscopy;  Laterality: N/A;   ESOPHAGOGASTRODUODENOSCOPY (EGD) WITH PROPOFOL N/A 09/07/2021   Procedure: ESOPHAGOGASTRODUODENOSCOPY (EGD) WITH PROPOFOL;  Surgeon: Lucilla Lame, MD;  Location: [place];  Service: Endoscopy;  Laterality: N/A;  Diabetic   MEATOTOMY N/A 01/10/2023   Procedure: MEATOTOMY ADULT;  Surgeon: Franchot Gallo, MD;  Location[facility];  Service: Urology;  Laterality: N/A;   PARS PLANA VITRECTOMY Right 03/21/2021   Procedure: PARS PLANA VITRECTOMY 25 GAUGE WITH INJECTION OF ANTIBIOTICS FOR ENDOPHTHALMITIS;  Surgeon: Jalene Mullet, MD;  Location: [place];  Service: Ophthalmology;  Laterality: Right;   POLYPECTOMY N/A 09/07/2021   Procedure: POLYPECTOMY;  Surgeon: Lucilla Lame, MD;  Location: [place];   Service: Endoscopy;  Laterality: N/A;   REPAIR EXTENSOR TENDON Right 08/11/2022   Procedure: EXTENSOR CARPI ULNARIS TENDON DEBRIDEMENT, POSSIBLE SUBSHEATH RECONSTRUCTION;  Surgeon: Sherilyn Cooter, MD;  Location: [place];  Service: Orthopedics;  Laterality: Right;  or regional plus MAC    SOCIAL HISTORY: Social History   Socioeconomic History   Marital status: Married    Spouse name: [name]   Number of children: 1   Years of education: 12   Highest education level: Not on file  Occupational History   Occupation: Programmer, systems: [employer]    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  Tobacco Use   Smoking status: Never   Smokeless tobacco: Current    Types: Snuff  Vaping Use   Vaping Use: Never used  Substance and Sexual Activity   Alcohol use: Not Currently    Comment: seldom   Drug use: Never   Sexual activity: Yes    Birth control/protection: Surgical  Other Topics Concern   Not on file  Social History Narrative   Lives with wife   [place]   Caffeine- diet Mtn Dew, 2 glasses   Social Determinants of Health   Financial Resource Strain: Not on file  Food Insecurity: Not on file  Transportation Needs: Not on file  Physical Activity: Not on file  Stress: Not on file  Social Connections: Not on file  Intimate Partner Violence: Not on file    FAMILY HISTORY: Family History  Problem Relation Age of Onset   Aneurysm Mother  brain   Hypertension Mother    Cancer Father    Heart disease Father    Diabetes Father    Hypertension Father    Hyperlipidemia Father    Cancer Maternal Grandmother        melanoma   Heart disease Paternal Grandfather    Colon cancer Neg Hx     ALLERGIES:  is allergic to duloxetine, gluten meal, and codeine.  MEDICATIONS:  Current Outpatient Medications  Medication Sig Dispense Refill   alfuzosin (UROXATRAL) 10 MG 24 hr tablet Take 1 tablet (10 mg total) by mouth daily.  (Patient taking differently: Take 10 mg by mouth at bedtime.) 90 tablet 3   Ascorbic Acid (VITAMIN C) 1000 MG tablet Take 1,000 mg by mouth daily.     aspirin 81 MG EC tablet Take 81 mg by mouth daily.     atorvastatin (LIPITOR) 40 MG tablet Take 1 tablet (40 mg total) by mouth daily. (Patient taking differently: Take 40 mg by mouth daily.) 90 tablet 3   B Complex Vitamins (B COMPLEX PO) Take 1 tablet by mouth daily.     Calcium Carbonate (CALCIUM 500 PO) Take 1 tablet by mouth every other day.     carvedilol (COREG) 3.125 MG tablet Take 3.125 mg by mouth 2 (two) times daily with a meal.     Cholecalciferol (VITAMIN D-3) 25 MCG (1000 UT) CAPS Take 1 capsule by mouth 2 (two) times daily.     cloNIDine (CATAPRES) 0.2 MG tablet Take 0.2 mg by mouth 2 (two) times daily.     fenofibrate 160 MG tablet Take 160 mg by mouth at bedtime.     furosemide (LASIX) 20 MG tablet Take 20 mg by mouth 2 (two) times daily.     gabapentin (NEURONTIN) 300 MG capsule Take 900 mg by mouth 3 (three) times daily. Takes 3 caps twice daily and third time takes 4 caps     insulin glargine (LANTUS SOLOSTAR) 100 UNIT/ML Solostar Pen Inject 60 Units into the skin at bedtime. (Patient taking differently: Inject 60 Units into the skin at bedtime.) 60 mL 0   Multiple Vitamin (MULTIVITAMIN WITH MINERALS) TABS tablet Take 1 tablet by mouth at bedtime.     Omega-3 Fatty Acids (FISH OIL PO) Take 1 tablet by mouth daily.     pantoprazole (PROTONIX) 40 MG tablet Take 1 tablet by mouth daily.     tirzepatide (MOUNJARO) 10 MG/0.5ML Pen Inject 10 mg into the skin once a week. Friday's     vitamin E 180 MG (400 UNITS) capsule Take 400 Units by mouth 3 (three) times a week. M-W-F in am     No current facility-administered medications for this visit.   Facility-Administered Medications Ordered in Other Visits  Medication Dose Route Frequency Provider Last Rate Last Admin   iron sucrose (VENOFER) 200 mg in sodium chloride 0.9 % 100 mL IVPB   200 mg Intravenous Once Cammie Sickle, MD 440 mL/hr at 01/12/23 1543 200 mg at 01/12/23 1543      PHYSICAL EXAMINATION:   Vitals:   01/12/23 1444  BP: 139/64  Pulse: 66  Resp: 16  Temp: 97.6 F (36.4 C)  SpO2: 99%   Filed Weights   01/12/23 1444  Weight: 205 lb (93 kg)    Physical Exam Vitals and nursing note reviewed.  Constitutional:      Comments:    HENT:     Head: Normocephalic and atraumatic.     Mouth/Throat:  Pharynx: Oropharynx is clear.  Eyes:     Extraocular Movements: Extraocular movements intact.     Pupils: Pupils are equal, round, and reactive to light.  Cardiovascular:     Rate and Rhythm: Normal rate and regular rhythm.  Pulmonary:     Comments: Decreased breath sounds bilaterally.  Abdominal:     Palpations: Abdomen is soft.  Musculoskeletal:        General: Normal range of motion.     Cervical back: Normal range of motion.  Skin:    General: Skin is warm.  Neurological:     General: No focal deficit present.     Mental Status: He is alert and oriented to person, place, and time.  Psychiatric:        Behavior: Behavior normal.        Judgment: Judgment normal.     LABORATORY DATA:  I have reviewed the data as listed Lab Results  Component Value Date   WBC 7.5 01/12/2023   HGB 10.7 (L) 01/12/2023   HCT 33.6 (L) 01/12/2023   MCV 88.0 01/12/2023   PLT 230 01/12/2023   Recent Labs    08/09/22 1027 10/12/22 1211 01/10/23 0709 01/12/23 1416  NA 142 137 140 134*  K 4.4 4.0 4.3 4.2  CL 114* 109 105 102  CO2 22 21*  --  23  GLUCOSE 257* 268* 174* 345*  BUN 28* 30* 48* 57*  CREATININE 2.35* 1.67* 3.10* 2.31*  CALCIUM 8.4* 8.8*  --  9.2  GFRNONAA 31* 46*  --  31*     VAS US RENAL ARTERY DUPLEX  Result Date: 12/27/2022 ABDOMINAL VISCERAL Patient Name:  Brian Nielsen  Date of Exam:   12/27/2022 Medical Rec #: [id number]     Accession #:    [id number] Date of Birth: March 08, 1961    Patient Gender: M Patient Age:   25 years  Exam Location:  [facility] Procedure:      VAS US RENAL ARTERY DUPLEX Referring Phys: [id number] [place] -------------------------------------------------------------------------------- Indications: HTN High Risk Factors: Hypertension, hyperlipidemia, Diabetes, no history of                    smoking. Limitations: Obesity. Performing Technologist: Alvia Grove RVT  Examination Guidelines: A complete evaluation includes B-mode imaging, spectral Doppler, color Doppler, and power Doppler as needed of all accessible portions of each vessel. Bilateral testing is considered an integral part of a complete examination. Limited examinations for reoccurring indications may be performed as noted.  Duplex Findings: +------------------+--------+--------+-------+ Right Renal ArteryPSV cm/sEDV cm/sComment +------------------+--------+--------+-------+ Proximal            230      23           +------------------+--------+--------+-------+ Mid                 191      23           +------------------+--------+--------+-------+ Distal              223      31           +------------------+--------+--------+-------+ +-----------------+--------+--------+-------+ Left Renal ArteryPSV cm/sEDV cm/sComment +-----------------+--------+--------+-------+ Proximal           141      24           +-----------------+--------+--------+-------+ Mid                122  29           +-----------------+--------+--------+-------+ Distal             229      28           +-----------------+--------+--------+-------+ +------------+--------+--------+--+-----------+--------+--------+---+ Right KidneyPSV cm/sEDV cm/sRILeft KidneyPSV cm/sEDV cm/sRI  +------------+--------+--------+--+-----------+--------+--------+---+ Upper Pole                    Upper Pole                     +------------+--------+--------+--+-----------+--------+--------+---+ Mid                            Mid                            +------------+--------+--------+--+-----------+--------+--------+---+ Lower Pole                    Lower Pole                     +------------+--------+--------+--+-----------+--------+--------+---+ Hilar                         Hilar                          +------------+--------+--------+--+-----------+--------+--------+---+ +------------------+-------+------------------+-------+ Right Kidney             Left Kidney               +------------------+-------+------------------+-------+ RAR                      RAR                       +------------------+-------+------------------+-------+ RAR (manual)             RAR (manual)              +------------------+-------+------------------+-------+ Cortex                   Cortex                    +------------------+-------+------------------+-------+ Cortex thickness  1.80 mmCorex thickness   0.97 mm +------------------+-------+------------------+-------+ Kidney length (cm)12.20  Kidney length (cm)12.10   +------------------+-------+------------------+-------+  Summary: Renal:  Right: Normal size right kidney. 1-59% stenosis of the right renal        artery. Patent renal vein. Left:  Normal size of left kidney. 1-59% stenosis of the left renal        artery . Patent renal vein  *See table(s) above for measurements and observations.  Diagnosing physician: Harold Barban MD  Electronically signed by Harold Barban MD on 12/27/2022 at 2:07:28 PM.    Final     Symptomatic anemia #Anemia-symptomatic fatigue hemoglobin 9-10-unclear etiology; however suspect multifactorial-iron malabsorption/celiac disease/CKD-stage III- stable.    # Discussed that he will need likely maintenance IV iron every few months.  Also discussed re: epo injections unless hb is less than 10 consistently. Nov 2023-iron saturation 27%; ferritin-239. Current Hb- 10; proceed with IV venofer today.    #  Ckd stage III [GFR- 31; nephro, [place]]-diabetes- GFR-31 stable.    # Diabetes [[facility] Hb A1c- 7.4]-PBG 353 [steroid injectio]-continue follow-up with endocrinology/monitoring of blood sugars. Poorly controlled.   # DISPOSITION: # ask  lab NOT to add HbA1c  # Venofer today # follow up in 3 months- MD; labs- cbc/bmp;iron studies;ferritin-;possible venofer -Dr.B  All questions were answered. The patient knows to call the clinic with any problems, questions or concerns.    Cammie Sickle, MD 01/12/2023 3:56 PM

## 2023-01-12 NOTE — Progress Notes (Signed)
Pt tolerated treatment without complaints.  VSS.  Pt refused 30 minute post observation.

## 2023-01-12 NOTE — Progress Notes (Signed)
Pt in for follow up, reports continued pain in right foot.  Pt states had right foot injection yesterday and surgery on Monday for urinary blockage Pt requesting to have A1C drawn today.

## 2023-01-13 ENCOUNTER — Encounter: Payer: Self-pay | Admitting: Radiology

## 2023-01-13 ENCOUNTER — Inpatient Hospital Stay: Payer: BC Managed Care – PPO

## 2023-01-17 ENCOUNTER — Other Ambulatory Visit: Payer: BC Managed Care – PPO

## 2023-01-17 ENCOUNTER — Ambulatory Visit: Payer: BC Managed Care – PPO | Admitting: Internal Medicine

## 2023-01-17 ENCOUNTER — Ambulatory Visit: Payer: BC Managed Care – PPO

## 2023-01-28 DIAGNOSIS — M25571 Pain in right ankle and joints of right foot: Secondary | ICD-10-CM | POA: Insufficient documentation

## 2023-01-31 ENCOUNTER — Other Ambulatory Visit: Payer: Self-pay | Admitting: Internal Medicine

## 2023-01-31 ENCOUNTER — Telehealth: Payer: Self-pay | Admitting: *Deleted

## 2023-01-31 NOTE — Telephone Encounter (Signed)
Patient wife called reporting that patient Nephrologist called last regarding his lab results, his HGB has dropped to 9.7, patient complaining that he feels his legs are weighted and difficult to lift them . She is asking if he needs blood. Please advise.  CBC Specimen: Blood - Blood, Venous Component Ref Range & Units 4 d ago  WBC 3.8 - 10.8 Thousand/uL 4.9  RBC 4.20 - 5.80 Million/uL 3.42 Low   Hemoglobin 13.2 - 17.1 g/dL 9.7 Low   Hematocrit 38.5 - 50.0 % 29.7 Low   MCV 80.0 - 100.0 fL 86.8  MCH 27.0 - 33.0 pg 28.4  MCHC 32.0 - 36.0 g/dL 32.7  RDW 11.0 - 15.0 % 13.9  Platelets 140 - 400 Thousand/uL 200  MPV 7.5 - 12.5 fL 10.9  Resulting Agency [facility]  Specimen Collected: 01/27/23 15:34   Performed by: Yvetta Coder Last Resulted: 01/28/23 01:58  Received From: [facility]  Result Received: 01/31/23 10:11

## 2023-01-31 NOTE — Progress Notes (Signed)
As per patient/wife call recent hemoglobin 9.7.  Recommend Retacrit if hemoglobin less than 10.  Recommend follow-up in the clinic sooner with APP; consider Retacrit   Add possible Retacrit-along with labs and APP visit;   Thanks GB

## 2023-02-01 ENCOUNTER — Encounter: Payer: Self-pay | Admitting: Internal Medicine

## 2023-02-01 ENCOUNTER — Ambulatory Visit: Payer: BC Managed Care – PPO | Admitting: "Endocrinology

## 2023-02-02 ENCOUNTER — Encounter: Payer: Self-pay | Admitting: Medical Oncology

## 2023-02-02 ENCOUNTER — Inpatient Hospital Stay: Payer: BC Managed Care – PPO | Attending: Internal Medicine

## 2023-02-02 ENCOUNTER — Inpatient Hospital Stay (HOSPITAL_BASED_OUTPATIENT_CLINIC_OR_DEPARTMENT_OTHER): Payer: BC Managed Care – PPO | Admitting: Medical Oncology

## 2023-02-02 ENCOUNTER — Inpatient Hospital Stay: Payer: BC Managed Care – PPO

## 2023-02-02 VITALS — BP 178/79 | HR 55 | Temp 98.4°F | Resp 18 | Ht 68.0 in | Wt 217.0 lb

## 2023-02-02 DIAGNOSIS — D509 Iron deficiency anemia, unspecified: Secondary | ICD-10-CM

## 2023-02-02 DIAGNOSIS — D631 Anemia in chronic kidney disease: Secondary | ICD-10-CM | POA: Insufficient documentation

## 2023-02-02 DIAGNOSIS — I129 Hypertensive chronic kidney disease with stage 1 through stage 4 chronic kidney disease, or unspecified chronic kidney disease: Secondary | ICD-10-CM | POA: Insufficient documentation

## 2023-02-02 DIAGNOSIS — E611 Iron deficiency: Secondary | ICD-10-CM | POA: Diagnosis not present

## 2023-02-02 DIAGNOSIS — D649 Anemia, unspecified: Secondary | ICD-10-CM

## 2023-02-02 DIAGNOSIS — N183 Chronic kidney disease, stage 3 unspecified: Secondary | ICD-10-CM | POA: Diagnosis present

## 2023-02-02 LAB — CMP (CANCER CENTER ONLY)
ALT: 16 U/L (ref 0–44)
AST: 22 U/L (ref 15–41)
Albumin: 3.1 g/dL — ABNORMAL LOW (ref 3.5–5.0)
Alkaline Phosphatase: 62 U/L (ref 38–126)
Anion gap: 6 (ref 5–15)
BUN: 31 mg/dL — ABNORMAL HIGH (ref 8–23)
CO2: 23 mmol/L (ref 22–32)
Calcium: 8.4 mg/dL — ABNORMAL LOW (ref 8.9–10.3)
Chloride: 105 mmol/L (ref 98–111)
Creatinine: 1.87 mg/dL — ABNORMAL HIGH (ref 0.61–1.24)
GFR, Estimated: 40 mL/min — ABNORMAL LOW (ref >60)
Glucose, Bld: 295 mg/dL — ABNORMAL HIGH (ref 70–99)
Potassium: 3.6 mmol/L (ref 3.5–5.1)
Sodium: 134 mmol/L — ABNORMAL LOW (ref 135–145)
Total Bilirubin: 0.4 mg/dL (ref 0.3–1.2)
Total Protein: 6 g/dL — ABNORMAL LOW (ref 6.5–8.1)

## 2023-02-02 LAB — CBC WITH DIFFERENTIAL (CANCER CENTER ONLY)
Abs Immature Granulocytes: 0.09 10*3/uL — ABNORMAL HIGH (ref 0.00–0.07)
Basophils Absolute: 0 10*3/uL (ref 0.0–0.1)
Basophils Relative: 0 %
Eosinophils Absolute: 0.3 10*3/uL (ref 0.0–0.5)
Eosinophils Relative: 6 %
HCT: 30.2 % — ABNORMAL LOW (ref 39.0–52.0)
Hemoglobin: 9.7 g/dL — ABNORMAL LOW (ref 13.0–17.0)
Immature Granulocytes: 2 %
Lymphocytes Relative: 14 %
Lymphs Abs: 0.7 10*3/uL (ref 0.7–4.0)
MCH: 28.2 pg (ref 26.0–34.0)
MCHC: 32.1 g/dL (ref 30.0–36.0)
MCV: 87.8 fL (ref 80.0–100.0)
Monocytes Absolute: 0.4 10*3/uL (ref 0.1–1.0)
Monocytes Relative: 9 %
Neutro Abs: 3.2 10*3/uL (ref 1.7–7.7)
Neutrophils Relative %: 69 %
Platelet Count: 190 10*3/uL (ref 150–400)
RBC: 3.44 MIL/uL — ABNORMAL LOW (ref 4.22–5.81)
RDW: 14.3 % (ref 11.5–15.5)
WBC Count: 4.6 10*3/uL (ref 4.0–10.5)
nRBC: 0 % (ref 0.0–0.2)

## 2023-02-02 LAB — RETICULOCYTES
Immature Retic Fract: 13.4 % (ref 2.3–15.9)
RBC.: 3.38 MIL/uL — ABNORMAL LOW (ref 4.22–5.81)
Retic Count, Absolute: 100.7 10*3/uL (ref 19.0–186.0)
Retic Ct Pct: 3 % (ref 0.4–3.1)

## 2023-02-02 LAB — IRON AND TIBC
Iron: 64 ug/dL (ref 45–182)
Saturation Ratios: 19 % (ref 17.9–39.5)
TIBC: 344 ug/dL (ref 250–450)
UIBC: 280 ug/dL

## 2023-02-02 LAB — FERRITIN: Ferritin: 325 ng/mL (ref 24–336)

## 2023-02-02 MED ORDER — EPOETIN ALFA-EPBX 10000 UNIT/ML IJ SOLN
20000.0000 [IU] | Freq: Once | INTRAMUSCULAR | Status: AC
  Administered 2023-02-02: 20000 [IU] via SUBCUTANEOUS
  Filled 2023-02-02: qty 2

## 2023-02-02 NOTE — Progress Notes (Signed)
Hematology and Oncology Follow Up Visit  Brian Nielsen [id number] Mar 06, 1961 62 y.o. 02/02/2023  Past Medical History:  Diagnosis Date   Adult celiac disease 2015   followed by dr Brian Nielsen (GI)   Anemia associated with chronic renal failure    hematologist--- dr Brian Nielsen;  treated with iron infusions   Benign localized prostatic hyperplasia with lower urinary tract symptoms (LUTS)    urologist--- dr Brian Nielsen   Charcot foot due to diabetes mellitus ([place])    Chronic constipation    CKD (chronic kidney disease), stage III [facility])    nephrologist--- dr Brian Nielsen;  renal bx 11-20-2021   Diverticulosis of colon    Edema of both lower extremities    GERD (gastroesophageal reflux disease)    History of adenomatous polyp of colon    HTN (hypertension)    Hyperlipidemia, mixed    Peripheral neuropathy    Peyronie's disease    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 meatal stricture    Retinopathy due to secondary diabetes mellitus ([place])    both eyes  treated with injecitons   Thoracic spondylosis without myelopathy 11/22/2016   Type 2 diabetes mellitus [facility])    endocrinologist--- dr Brian Nielsen   Vitamin D deficiency    Wears hearing aid in both ears     Principle Diagnosis:  Anemia in the setting of CKD   Current Therapy:   IV Iron PRN Retacrit starting 02/02/2023 however insurance pending.      Interim History:  Brian Nielsen is back for follow-up for anemia:  Patient is followed by our office for anemia in the setting of CKD. Recently he was seen at his nephrologists office where his hemoglobin was 9.7. Also having some muscle fatigue and cramping. Since his next follow up was not for a few more weeks it was moved up given Hgb level.   Today he reports that he is really anxious about his lab levels. He does not know why his Hgb has dropped. He denies any hematuria, hemoptysis, bowel changes/dark/bloody stools, epistaxis. He denies SOB, chest pains but does have occasional muscle cramping and fatigue. He  is UTD on his colonoscopies- next due in 2 years per patient. Recent renal function panel showed a creatinine of 2.89 and GFR 24.      Wt Readings from Last 3 Encounters:  02/02/23 217 lb (98.4 kg)  01/12/23 205 lb (93 kg)  01/10/23 204 lb 9.6 oz (92.8 kg)     Medications:   Current Outpatient Medications:    alfuzosin (UROXATRAL) 10 MG 24 hr tablet, Take 1 tablet (10 mg total) by mouth daily. (Patient taking differently: Take 10 mg by mouth at bedtime.), Disp: 90 tablet, Rfl: 3   Ascorbic Acid (VITAMIN C) 1000 MG tablet, Take 1,000 mg by mouth daily., Disp: , Rfl:    aspirin 81 MG EC tablet, Take 81 mg by mouth daily., Disp: , Rfl:    atorvastatin (LIPITOR) 40 MG tablet, Take 1 tablet (40 mg total) by mouth daily. (Patient taking differently: Take 40 mg by mouth daily.), Disp: 90 tablet, Rfl: 3   B Complex Vitamins (B COMPLEX PO), Take 1 tablet by mouth daily., Disp: , Rfl:    Cholecalciferol (VITAMIN D-3) 25 MCG (1000 UT) CAPS, Take 1 capsule by mouth 2 (two) times daily., Disp: , Rfl:    cloNIDine (CATAPRES) 0.2 MG tablet, Take 0.2 mg by mouth 2 (two) times daily., Disp: , Rfl:    fenofibrate 160 MG tablet, Take 160 mg by mouth at bedtime., Disp: ,  Rfl:    furosemide (LASIX) 20 MG tablet, Take 20 mg by mouth 2 (two) times daily. Taking 1/2 in the morning and 1/2 at night, Disp: , Rfl:    gabapentin (NEURONTIN) 300 MG capsule, Take 900 mg by mouth 3 (three) times daily. Takes 3 caps twice daily and third time takes 4 caps, Disp: , Rfl:    insulin glargine (LANTUS SOLOSTAR) 100 UNIT/ML Solostar Pen, Inject 60 Units into the skin at bedtime. (Patient taking differently: Inject 60 Units into the skin at bedtime.), Disp: 60 mL, Rfl: 0   Multiple Vitamin (MULTIVITAMIN WITH MINERALS) TABS tablet, Take 1 tablet by mouth at bedtime., Disp: , Rfl:    Omega-3 Fatty Acids (FISH OIL PO), Take 1 tablet by mouth daily., Disp: , Rfl:    pantoprazole (PROTONIX) 40 MG tablet, Take 1 tablet by mouth daily.,  Disp: , Rfl:    tirzepatide (MOUNJARO) 10 MG/0.5ML Pen, Inject 10 mg into the skin once a week. Friday's, Disp: , Rfl:  No current facility-administered medications for this visit.  Facility-Administered Medications Ordered in Other Visits:    epoetin alfa-epbx (RETACRIT) injection 20,000 Units, 20,000 Units, Subcutaneous, Once, Brian Sickle, MD  Allergies:  Allergies  Allergen Reactions   Duloxetine Other (See Comments)    "disoriented, confused, couldn't drive"   Gluten Meal     Celiac Disease   Codeine Rash    Past Medical History, Surgical history, Social history, and Family History were reviewed and updated.  Review of Systems: Review of Systems  Constitutional:  Positive for fatigue. Negative for chills.  HENT:   Negative for nosebleeds.   Respiratory:  Negative for chest tightness, cough, hemoptysis and shortness of breath.   Cardiovascular:  Negative for chest pain and leg swelling.  Gastrointestinal:  Negative for blood in stool.  Genitourinary:  Negative for hematuria.      Physical Exam:  height is 5\' 8"  (1.727 m) and weight is 217 lb (98.4 kg). His tympanic temperature is 98.4 F (36.9 C). His blood pressure is 178/79 (abnormal) and his pulse is 55 (abnormal). His respiration is 18 and oxygen saturation is 97%.   Physical Exam Vitals and nursing note reviewed.  Constitutional:      General: He is not in acute distress.    Appearance: Normal appearance. He is not ill-appearing, toxic-appearing or diaphoretic.  Cardiovascular:     Rate and Rhythm: Normal rate and regular rhythm.     Pulses: Normal pulses.     Heart sounds: Normal heart sounds.  Pulmonary:     Effort: Pulmonary effort is normal.     Breath sounds: Normal breath sounds.  Abdominal:     Palpations: Abdomen is soft.  Skin:    General: Skin is warm.     Coloration: Skin is not pale.  Neurological:     General: No focal deficit present.     Mental Status: He is alert and oriented to  person, place, and time.       Lab Results  Component Value Date   WBC 4.6 02/02/2023   HGB 9.7 (L) 02/02/2023   HCT 30.2 (L) 02/02/2023   MCV 87.8 02/02/2023   PLT 190 02/02/2023     Chemistry      Component Value Date/Time   NA 134 (L) 02/02/2023 1441   K 3.6 02/02/2023 1441   CL 105 02/02/2023 1441   CO2 23 02/02/2023 1441   BUN 31 (H) 02/02/2023 1441   CREATININE 1.87 (H) 02/02/2023 1441  CREATININE 0.88 01/11/2017 0805      Component Value Date/Time   CALCIUM 8.4 (L) 02/02/2023 1441   CALCIUM 8.5 (L) 12/09/2021 1545   ALKPHOS 62 02/02/2023 1441   AST 22 02/02/2023 1441   ALT 16 02/02/2023 1441   BILITOT 0.4 02/02/2023 1441      Impression and Plan: 1. Iron deficiency anemia, unspecified iron deficiency anemia type - Reticulocytes; Future  2. Normocytic anemia - Reticulocytes; Future    Mr. Fitzner presents today for consideration of retacrit and IV iron. Upon review of his CBC and BMP labs over the past few weeks it appears that he had a AKI which has resolved. Creatinine rose as high as 3.10 but today is 1.87. Baseline appears to be around 1.8. His GFR is 40 today. It appears that his Hgb started to trend down following this AKI. I suspect this was linked. Likely has/had erythropoietin deficiency which I relayed to patient. I discussed how this hormone helps to tell the bone marrow to make new red blood cells. I discussed EPO replacement with patient. We also reviewed his recent iron levels which were normal to elevated. I would not suggest IV iron today. I am agreeable with his retacrit given his clinical picture. I did however, ask that he follow back up with GI as a precaution. We also discussed his elevated blood pressure reading today. He is asymptomatic. BP improved on recheck to within target range for Retacrit therapy. He reports that he is working with Nephrology closely to adjust his BP medications as his essential hypertension. He will continue to follow  their advisement.   Disposition: Retacrit today RTC 1 week APP, labs (CBC, BMP)-[facility]    Nelwyn Salisbury PA-C 3/20/20243:41 PM

## 2023-02-03 ENCOUNTER — Emergency Department (HOSPITAL_COMMUNITY): Payer: BC Managed Care – PPO

## 2023-02-03 ENCOUNTER — Encounter (HOSPITAL_COMMUNITY): Payer: Self-pay

## 2023-02-03 ENCOUNTER — Other Ambulatory Visit: Payer: Self-pay

## 2023-02-03 ENCOUNTER — Telehealth: Payer: Self-pay | Admitting: Internal Medicine

## 2023-02-03 ENCOUNTER — Emergency Department (HOSPITAL_COMMUNITY)
Admission: EM | Admit: 2023-02-03 | Discharge: 2023-02-03 | Disposition: A | Discharge status: Home / Self Care | Payer: BC Managed Care – PPO | Attending: Emergency Medicine | Admitting: Emergency Medicine

## 2023-02-03 DIAGNOSIS — Z5329 Procedure and treatment not carried out because of patient's decision for other reasons: Secondary | ICD-10-CM | POA: Diagnosis not present

## 2023-02-03 DIAGNOSIS — Z79899 Other long term (current) drug therapy: Secondary | ICD-10-CM | POA: Insufficient documentation

## 2023-02-03 DIAGNOSIS — E1122 Type 2 diabetes mellitus with diabetic chronic kidney disease: Secondary | ICD-10-CM | POA: Insufficient documentation

## 2023-02-03 DIAGNOSIS — Z7982 Long term (current) use of aspirin: Secondary | ICD-10-CM | POA: Insufficient documentation

## 2023-02-03 DIAGNOSIS — R0602 Shortness of breath: Secondary | ICD-10-CM | POA: Diagnosis not present

## 2023-02-03 DIAGNOSIS — N189 Chronic kidney disease, unspecified: Secondary | ICD-10-CM | POA: Insufficient documentation

## 2023-02-03 DIAGNOSIS — R001 Bradycardia, unspecified: Secondary | ICD-10-CM | POA: Diagnosis not present

## 2023-02-03 DIAGNOSIS — Z794 Long term (current) use of insulin: Secondary | ICD-10-CM | POA: Insufficient documentation

## 2023-02-03 DIAGNOSIS — I129 Hypertensive chronic kidney disease with stage 1 through stage 4 chronic kidney disease, or unspecified chronic kidney disease: Secondary | ICD-10-CM | POA: Diagnosis not present

## 2023-02-03 DIAGNOSIS — R0789 Other chest pain: Secondary | ICD-10-CM | POA: Insufficient documentation

## 2023-02-03 LAB — BASIC METABOLIC PANEL
Anion gap: 9 (ref 5–15)
BUN: 32 mg/dL — ABNORMAL HIGH (ref 8–23)
CO2: 20 mmol/L — ABNORMAL LOW (ref 22–32)
Calcium: 8.3 mg/dL — ABNORMAL LOW (ref 8.9–10.3)
Chloride: 108 mmol/L (ref 98–111)
Creatinine, Ser: 1.89 mg/dL — ABNORMAL HIGH (ref 0.61–1.24)
GFR, Estimated: 40 mL/min — ABNORMAL LOW (ref >60)
Glucose, Bld: 155 mg/dL — ABNORMAL HIGH (ref 70–99)
Potassium: 3.6 mmol/L (ref 3.5–5.1)
Sodium: 137 mmol/L (ref 135–145)

## 2023-02-03 LAB — CBC
HCT: 30.7 % — ABNORMAL LOW (ref 39.0–52.0)
Hemoglobin: 9.9 g/dL — ABNORMAL LOW (ref 13.0–17.0)
MCH: 28.7 pg (ref 26.0–34.0)
MCHC: 32.2 g/dL (ref 30.0–36.0)
MCV: 89 fL (ref 80.0–100.0)
Platelets: 193 10*3/uL (ref 150–400)
RBC: 3.45 MIL/uL — ABNORMAL LOW (ref 4.22–5.81)
RDW: 14.6 % (ref 11.5–15.5)
WBC: 4.5 10*3/uL (ref 4.0–10.5)
nRBC: 0 % (ref 0.0–0.2)

## 2023-02-03 LAB — TROPONIN I (HIGH SENSITIVITY): Troponin I (High Sensitivity): 7 ng/L (ref <18)

## 2023-02-03 MED ORDER — ALBUTEROL SULFATE HFA 108 (90 BASE) MCG/ACT IN AERS: 2.0000 | INHALATION_SPRAY | RESPIRATORY_TRACT | Status: DC | PRN

## 2023-02-03 NOTE — Discharge Instructions (Addendum)
You came to the ER today for shortness of breath and chest tightness.  Your lab work, chest x-ray, and EKG were reassuring today.  At this time leaving before further evaluation would be AGAINST MEDICAL ADVICE, but you have explained your understanding of this.  Please discuss your symptoms with your primary doctor at your scheduled appointment next week.  Continue to monitor how you're doing and return to the ER for new or worsening symptoms, or to finish your evaluation.

## 2023-02-03 NOTE — ED Provider Notes (Signed)
[facility] Provider Note   CSN: [id number] Arrival date & time: 02/03/23  1637     History  Chief Complaint  Patient presents with   Shortness of Breath    Brian Nielsen is a 62 y.o. male with history of hypertension, hyperlipidemia, GERD, type 2 diabetes, CKD, peripheral neuropathy, iron deficiency anemia who presents to the emergency department complaining of shortness of breath and chest tightness.  This is been going on for 2 days.  Had an appointment with his nephrologist today, and they recommended that he come to the ER.   Shortness of Breath      Home Medications Prior to Admission medications   Medication Sig Start Date End Date Taking? Authorizing Provider  alfuzosin (UROXATRAL) 10 MG 24 hr tablet Take 1 tablet (10 mg total) by mouth daily. Patient taking differently: Take 10 mg by mouth at bedtime. 10/26/22   Franchot Gallo, MD  Ascorbic Acid (VITAMIN C) 1000 MG tablet Take 1,000 mg by mouth daily.    [provider]  aspirin 81 MG EC tablet Take 81 mg by mouth daily.    [provider]  atorvastatin (LIPITOR) 40 MG tablet Take 1 tablet (40 mg total) by mouth daily. Patient taking differently: Take 40 mg by mouth daily. 10/21/16   Raylene Everts, MD  B Complex Vitamins (B COMPLEX PO) Take 1 tablet by mouth daily.    [provider]  Cholecalciferol (VITAMIN D-3) 25 MCG (1000 UT) CAPS Take 1 capsule by mouth 2 (two) times daily.    [provider]  cloNIDine (CATAPRES) 0.2 MG tablet Take 0.2 mg by mouth 2 (two) times daily. 12/15/22 04/14/23  [provider]  fenofibrate 160 MG tablet Take 160 mg by mouth at bedtime.    [provider]  furosemide (LASIX) 20 MG tablet Take 20 mg by mouth 2 (two) times daily. Taking 1/2 in the morning and 1/2 at night 01/28/23   [provider]  gabapentin (NEURONTIN) 300 MG capsule Take 900 mg by mouth 3 (three) times daily.  Takes 3 caps twice daily and third time takes 4 caps 02/16/22   [provider]  insulin glargine (LANTUS SOLOSTAR) 100 UNIT/ML Solostar Pen Inject 60 Units into the skin at bedtime. Patient taking differently: Inject 60 Units into the skin at bedtime. 01/04/23   Cassandria Anger, MD  Multiple Vitamin (MULTIVITAMIN WITH MINERALS) TABS tablet Take 1 tablet by mouth at bedtime.    [provider]  Omega-3 Fatty Acids (FISH OIL PO) Take 1 tablet by mouth daily.    [provider]  pantoprazole (PROTONIX) 40 MG tablet Take 1 tablet by mouth daily. 11/23/21   [provider]  tirzepatide Darcel Bayley) 10 MG/0.5ML Pen Inject 10 mg into the skin once a week. Friday's    [provider]      Allergies    Duloxetine, Gluten meal, and Codeine    Review of Systems   Review of Systems  Reason unable to perform ROS: Patient left AMA.  Respiratory:  Positive for chest tightness and shortness of breath.     Physical Exam Updated Vital Signs BP (!) 174/77   Pulse (!) 50   Temp 98.5 F (36.9 C) (Temporal)   Resp 15   Ht 5\' 8"  (1.727 m)   Wt 98.4 kg   SpO2 97%   BMI 32.99 kg/m  Physical Exam Vitals and nursing note reviewed.  Constitutional:  Appearance: Normal appearance.  HENT:     Head: Normocephalic and atraumatic.  Eyes:     Conjunctiva/sclera: Conjunctivae normal.  Pulmonary:     Effort: Pulmonary effort is normal. No respiratory distress.  Skin:    General: Skin is warm and dry.  Neurological:     Mental Status: He is alert.  Psychiatric:        Mood and Affect: Mood normal.        Behavior: Behavior normal.     ED Results / Procedures / Treatments   Labs (all labs ordered are listed, but only abnormal results are displayed) Labs Reviewed  BASIC METABOLIC PANEL - Abnormal; Notable for the following components:      Result Value   CO2 20 (*)    Glucose, Bld 155 (*)    BUN 32 (*)    Creatinine, Ser 1.89 (*)    Calcium 8.3  (*)    GFR, Estimated 40 (*)    All other components within normal limits  CBC - Abnormal; Notable for the following components:   RBC 3.45 (*)    Hemoglobin 9.9 (*)    HCT 30.7 (*)    All other components within normal limits  TROPONIN I (HIGH SENSITIVITY)  TROPONIN I (HIGH SENSITIVITY)    EKG EKG Interpretation  Date/Time:  Thursday February 03 2023 16:54:14 EDT Ventricular Rate:  51 PR Interval:  164 QRS Duration: 106 QT Interval:  458 QTC Calculation: 422 R Axis:   15 Text Interpretation: Sinus bradycardia Otherwise normal ECG When compared with ECG of 10-Jan-2023 07:48, No significant change was found Confirmed by Aletta Edouard [phone]) on 02/03/2023 4:55:41 PM  Radiology DG Chest 2 View  Result Date: 02/03/2023 CLINICAL DATA:  Shortness of breath. EXAM: CHEST - 2 VIEW COMPARISON:  April 22, 2021 FINDINGS: The heart size and mediastinal contours are within normal limits. Very mild linear atelectasis is seen within the left lung base. There is no evidence of an acute infiltrate, pleural effusion or pneumothorax. The visualized skeletal structures are unremarkable. IMPRESSION: No active cardiopulmonary disease. Electronically Signed   By: Virgina Norfolk M.D.   On: 02/03/2023 17:22    Procedures Procedures    Medications Ordered in ED Medications  albuterol (VENTOLIN HFA) 108 (90 Base) MCG/ACT inhaler 2 puff (has no administration in time range)    ED Course/ Medical Decision Making/ A&P                             Medical Decision Making Amount and/or Complexity of Data Reviewed Labs: ordered. Radiology: ordered.  Risk Prescription drug management.   Patient is a 62 year old male with history of hypertension, hyperlipidemia, GERD, type 2 diabetes, CKD, peripheral neuropathy, iron deficiency anemia who presented to the emergency department complaining of chest tightness and shortness of breath for the past 2 days.  Labs and imaging were ordered from triage  including CBC, BMP, troponin, chest x-ray, EKG.  I reviewed these test with the following pertinent results: Hemoglobin stable at 9.9, BNP fairly unchanged compared to prior.  Initial troponin 7.  Chest x-ray without acute cardiopulmonary abnormalities.  EKG with sinus bradycardia, interpreted by my attending physician.  When I went to evaluate the patient, he was asking to leave due to wait times.  He had looked over his results, and felt as though we did not find anything to contribute to his symptoms.  He felt he had been waiting too long,  and did not want to stay if everything looked normal.  Both myself and nursing staff had conversation encouraging patient to stay, but he decided to leave [place].  We discussed the nature and purpose, risks and benefits, as well as, the alternatives of treatment. Time was given to allow the opportunity to ask questions and consider their options, and after the discussion, the patient decided to refuse the offerred treatment. The patient was informed that refusal could lead to, but was not limited to, death, permanent disability, or severe pain. If present, I asked the relatives or significant others to dissuade them without success. Prior to refusing, I determined that the patient had the capacity to make their decision and understood the consequences of that decision. After refusal, I made every reasonable opportunity to treat them to the best of my ability.  The patient was notified that they may return to the emergency department at any time for further treatment.  He plans to follow up with his urologist tomorrow and his PCP next week.  Final Clinical Impression(s) / ED Diagnoses Final diagnoses:  Shortness of breath  Chest tightness    Rx / DC Orders ED Discharge Orders     None      Portions of this report may have been transcribed using voice recognition software. Every effort was made to ensure accuracy; however, inadvertent  computerized transcription errors may be present.    Estill Cotta 02/03/23 2107    Hayden Rasmussen, MD 02/04/23 1524

## 2023-02-03 NOTE — Telephone Encounter (Signed)
pt spouse called and wanted to know if she can be scheduled with MD rather than NP. He is scheduled to come back next week 3/28. Would like  Each visit to be with [name] to ensure consistency with following care. Please Advise. I see that Dr.B is Out of Office on 3/28 would pt be able to be added 3/27?

## 2023-02-03 NOTE — ED Triage Notes (Signed)
Pt reports shortness of breath with chest tightness for 2 days and PCP told him to come to ER.

## 2023-02-07 ENCOUNTER — Ambulatory Visit: Payer: BC Managed Care – PPO | Admitting: "Endocrinology

## 2023-02-08 ENCOUNTER — Ambulatory Visit: Payer: BC Managed Care – PPO | Admitting: Urology

## 2023-02-08 ENCOUNTER — Encounter: Payer: Self-pay | Admitting: Nurse Practitioner

## 2023-02-08 DIAGNOSIS — I129 Hypertensive chronic kidney disease with stage 1 through stage 4 chronic kidney disease, or unspecified chronic kidney disease: Secondary | ICD-10-CM | POA: Insufficient documentation

## 2023-02-08 NOTE — Progress Notes (Signed)
Completed peer to peer for retacrit.

## 2023-02-09 ENCOUNTER — Other Ambulatory Visit (HOSPITAL_COMMUNITY): Payer: Self-pay | Admitting: Adult Health Nurse Practitioner

## 2023-02-09 ENCOUNTER — Other Ambulatory Visit: Payer: Self-pay

## 2023-02-09 DIAGNOSIS — R600 Localized edema: Secondary | ICD-10-CM

## 2023-02-09 DIAGNOSIS — R001 Bradycardia, unspecified: Secondary | ICD-10-CM

## 2023-02-09 DIAGNOSIS — D649 Anemia, unspecified: Secondary | ICD-10-CM

## 2023-02-10 ENCOUNTER — Inpatient Hospital Stay: Payer: BC Managed Care – PPO

## 2023-02-10 ENCOUNTER — Encounter: Payer: Self-pay | Admitting: Nurse Practitioner

## 2023-02-10 ENCOUNTER — Inpatient Hospital Stay (HOSPITAL_BASED_OUTPATIENT_CLINIC_OR_DEPARTMENT_OTHER): Payer: BC Managed Care – PPO | Admitting: Nurse Practitioner

## 2023-02-10 VITALS — BP 168/72 | HR 50 | Temp 97.2°F | Wt 215.0 lb

## 2023-02-10 DIAGNOSIS — D509 Iron deficiency anemia, unspecified: Secondary | ICD-10-CM

## 2023-02-10 DIAGNOSIS — N1832 Chronic kidney disease, stage 3b: Secondary | ICD-10-CM

## 2023-02-10 DIAGNOSIS — D631 Anemia in chronic kidney disease: Secondary | ICD-10-CM | POA: Diagnosis not present

## 2023-02-10 DIAGNOSIS — I129 Hypertensive chronic kidney disease with stage 1 through stage 4 chronic kidney disease, or unspecified chronic kidney disease: Secondary | ICD-10-CM | POA: Diagnosis not present

## 2023-02-10 DIAGNOSIS — D649 Anemia, unspecified: Secondary | ICD-10-CM

## 2023-02-10 LAB — BASIC METABOLIC PANEL - CANCER CENTER ONLY
Anion gap: 6 (ref 5–15)
BUN: 42 mg/dL — ABNORMAL HIGH (ref 8–23)
CO2: 25 mmol/L (ref 22–32)
Calcium: 8.3 mg/dL — ABNORMAL LOW (ref 8.9–10.3)
Chloride: 107 mmol/L (ref 98–111)
Creatinine: 2.24 mg/dL — ABNORMAL HIGH (ref 0.61–1.24)
GFR, Estimated: 33 mL/min — ABNORMAL LOW (ref >60)
Glucose, Bld: 238 mg/dL — ABNORMAL HIGH (ref 70–99)
Potassium: 3.7 mmol/L (ref 3.5–5.1)
Sodium: 138 mmol/L (ref 135–145)

## 2023-02-10 LAB — CBC WITH DIFFERENTIAL (CANCER CENTER ONLY)
Abs Immature Granulocytes: 0.01 10*3/uL (ref 0.00–0.07)
Basophils Absolute: 0 10*3/uL (ref 0.0–0.1)
Basophils Relative: 0 %
Eosinophils Absolute: 0.2 10*3/uL (ref 0.0–0.5)
Eosinophils Relative: 5 %
HCT: 31.1 % — ABNORMAL LOW (ref 39.0–52.0)
Hemoglobin: 9.9 g/dL — ABNORMAL LOW (ref 13.0–17.0)
Immature Granulocytes: 0 %
Lymphocytes Relative: 17 %
Lymphs Abs: 0.8 10*3/uL (ref 0.7–4.0)
MCH: 28.3 pg (ref 26.0–34.0)
MCHC: 31.8 g/dL (ref 30.0–36.0)
MCV: 88.9 fL (ref 80.0–100.0)
Monocytes Absolute: 0.5 10*3/uL (ref 0.1–1.0)
Monocytes Relative: 12 %
Neutro Abs: 3 10*3/uL (ref 1.7–7.7)
Neutrophils Relative %: 66 %
Platelet Count: 190 10*3/uL (ref 150–400)
RBC: 3.5 MIL/uL — ABNORMAL LOW (ref 4.22–5.81)
RDW: 14.8 % (ref 11.5–15.5)
WBC Count: 4.5 10*3/uL (ref 4.0–10.5)
nRBC: 0 % (ref 0.0–0.2)

## 2023-02-10 NOTE — Progress Notes (Signed)
[place] CONSULT NOTE  Patient Care Team: Patient, [name] as PCP - General ([place]) [name], NP (Inactive) as Nurse Practitioner (Endocrinology) Cammie Sickle, MD as Consulting Physician (Oncology)  CHIEF COMPLAINTS/PURPOSE OF CONSULTATION: ANEMIA  HEMATOLOGY HISTORY:  # ANEMIA PROGRESSIVE since 2020 12; 2022- 10 MCV- 90s; colonoscopy 2015; multiple endoscopies-last September 2021-biopsy active celiac disease  #CKD stage III [[facility]]/diabetes [[facility]]; ? Celiac disease [[facility]];   - Labs: 07/2014 - tTG IgA 101 03/2020 - positive endomysial Ab IgA, tTG IgA 78 - CSY: 07/2014 - normal appearing terminal ileum, sigmoid diverticulosis, and non-bleeding internal hemorrhoids - EGD: 07/2014 - mild non-erosive gastritis, normal appearing duodenal mucosa with duodenal biopsies showing celiac sprue - EGD: 06/03/2020 - procedure aborted due to presence of food - EGD: 08/12/2020 - irregular Z-line found at [facility] with bx showing mild reflux esophagitis, 1 cm hiatal hernia, mild chronic gastritis, decreased folds found in entire duodenum, flattening found in entire duodenum and scalloped mucosa with bx showing active Celiac disease with villous atrophy, increased intraepithelial lymphocytes, etc. Repeat in 1-year.  HISTORY OF PRESENTING ILLNESS: Alone.  Ambulating independently.  Brian Nielsen 62 y.o. male with multiple comorbidities, who returns to clinic for follow up. He started retacrit last week for anemia of CKD. Tolerated well without significant side effects. He has chronic fatigue which is unchanged. His shortness of breath has improved. 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     Review of Systems  Constitutional:  Positive for malaise/fatigue. Negative for chills, diaphoresis, fever and weight loss.  HENT:  Negative for nosebleeds and sore throat.   Eyes:  Negative for double vision.   Respiratory:  Negative for cough, hemoptysis, sputum production, shortness of breath and wheezing.   Cardiovascular:  Negative for chest pain, palpitations, orthopnea and leg swelling.  Gastrointestinal:  Negative for blood in stool, heartburn, melena, nausea and vomiting.  Genitourinary:  Negative for dysuria, frequency and urgency.  Musculoskeletal:  Negative for back pain and joint pain.  Skin: Negative.  Negative for itching and rash.  Neurological:  Negative for dizziness, tingling, focal weakness, weakness and headaches.  Endo/Heme/Allergies:  Does not bruise/bleed easily.  Psychiatric/Behavioral:  Negative for depression. The patient is not nervous/anxious and does not have insomnia.     MEDICAL HISTORY:  Past Medical History:  Diagnosis Date   Adult celiac disease 2015   followed by dr Sadaf Przybysz Norris (GI)   Anemia associated with chronic renal failure    hematologist--- dr Sharen Heck;  treated with iron infusions   Benign localized prostatic hyperplasia with lower urinary tract symptoms (LUTS)    urologist--- dr Diona Fanti   Charcot foot due to diabetes mellitus ([place])    Chronic constipation    CKD (chronic kidney disease), stage III [facility])    nephrologist--- dr Theador Hawthorne;  renal bx 11-20-2021   Diverticulosis of colon    Edema of both lower extremities    GERD (gastroesophageal reflux disease)    History of adenomatous polyp of colon    HTN (hypertension)    Hyperlipidemia, mixed    Peripheral neuropathy    Peyronie's disease    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 meatal stricture    Retinopathy due to secondary diabetes mellitus ([place])    both eyes  treated with injecitons   Thoracic spondylosis without myelopathy 11/22/2016   Type 2 diabetes mellitus [facility])    endocrinologist--- dr nida   Vitamin D deficiency    Wears  hearing aid in both ears     SURGICAL HISTORY: Past Surgical History:  Procedure Laterality Date   BIOPSY N/A 09/07/2021   Procedure: BIOPSY;  Surgeon: Lucilla Lame,  MD;  Location: [place];  Service: Endoscopy;  Laterality: N/A;   CATARACT EXTRACTION W/ INTRAOCULAR LENS IMPLANT Bilateral 2023   COLONOSCOPY N/A 05/03/2014   Procedure: COLONOSCOPY;  Surgeon: Danie Binder, MD;  [facility];  Service: Endoscopy;  Laterality: N/A;  12:00   COLONOSCOPY WITH PROPOFOL N/A 09/07/2021   Procedure: COLONOSCOPY WITH PROPOFOL;  Surgeon: Lucilla Lame, MD;  Location: [place];  Service: Endoscopy;  Laterality: N/A;   CYSTOSCOPY WITH URETHRAL DILATATION N/A 01/10/2023   Procedure: CYSTOSCOPY;  Surgeon: Franchot Gallo, MD;  Location[facility]  Service: Urology;  Laterality: N/A;   ESOPHAGOGASTRODUODENOSCOPY N/A 05/03/2014   Procedure: ESOPHAGOGASTRODUODENOSCOPY (EGD);  Surgeon: Danie Binder, MD;  [facility];  Service: Endoscopy;  Laterality: N/A;   ESOPHAGOGASTRODUODENOSCOPY (EGD) WITH PROPOFOL N/A 06/03/2020   Procedure: ESOPHAGOGASTRODUODENOSCOPY (EGD) WITH PROPOFOL;  Surgeon: [name], MD;  Location: [facility];  Service: Endoscopy;  Laterality: N/A;   ESOPHAGOGASTRODUODENOSCOPY (EGD) WITH PROPOFOL N/A 09/07/2021   Procedure: ESOPHAGOGASTRODUODENOSCOPY (EGD) WITH PROPOFOL;  Surgeon: Lucilla Lame, MD;  Location: [place];  Service: Endoscopy;  Laterality: N/A;  Diabetic   MEATOTOMY N/A 01/10/2023   Procedure: MEATOTOMY ADULT;  Surgeon: Franchot Gallo, MD;  Location[facility];  Service: Urology;  Laterality: N/A;   PARS PLANA VITRECTOMY Right 03/21/2021   Procedure: PARS PLANA VITRECTOMY 25 GAUGE WITH INJECTION OF ANTIBIOTICS FOR ENDOPHTHALMITIS;  Surgeon: Jalene Mullet, MD;  Location: [place];  Service: Ophthalmology;  Laterality: Right;   POLYPECTOMY N/A 09/07/2021   Procedure: POLYPECTOMY;  Surgeon: Lucilla Lame, MD;  Location: [place];  Service: Endoscopy;  Laterality: N/A;   REPAIR EXTENSOR TENDON Right 08/11/2022   Procedure: EXTENSOR  CARPI ULNARIS TENDON DEBRIDEMENT, POSSIBLE SUBSHEATH RECONSTRUCTION;  Surgeon: Sherilyn Cooter, MD;  Location: [place];  Service: Orthopedics;  Laterality: Right;  or regional plus MAC    SOCIAL HISTORY: Social History   Socioeconomic History   Marital status: Married    Spouse name: [name]   Number of children: 1   Years of education: 12   Highest education level: Not on file  Occupational History   Occupation: Programmer, systems: [employer]    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  Tobacco Use   Smoking status: Never   Smokeless tobacco: Current    Types: Snuff  Vaping Use   Vaping Use: Never used  Substance and Sexual Activity   Alcohol use: Not Currently    Comment: seldom   Drug use: Never   Sexual activity: Yes    Birth control/protection: Surgical  Other Topics Concern   Not on file  Social History Narrative   Lives with wife   [place]   Caffeine- diet Mtn Dew, 2 glasses   Social Determinants of Health   Financial Resource Strain: Not on file  Food Insecurity: Not on file  Transportation Needs: Not on file  Physical Activity: Not on file  Stress: Not on file  Social Connections: Not on file  Intimate Partner Violence: Not on file    FAMILY HISTORY: Family History  Problem Relation Age of Onset   Aneurysm Mother        brain   Hypertension Mother    Cancer Father  Heart disease Father    Diabetes Father    Hypertension Father    Hyperlipidemia Father    Cancer Maternal Grandmother        melanoma   Heart disease Paternal Grandfather    Colon cancer Neg Hx     ALLERGIES:  is allergic to duloxetine, gluten meal, and codeine.  MEDICATIONS:  Current Outpatient Medications  Medication Sig Dispense Refill   Ascorbic Acid (VITAMIN C) 1000 MG tablet Take 1,000 mg by mouth daily.     aspirin 81 MG EC tablet Take 81 mg by mouth daily.     atorvastatin (LIPITOR) 40 MG tablet Take 1 tablet (40 mg  total) by mouth daily. (Patient taking differently: Take 40 mg by mouth daily.) 90 tablet 3   B Complex Vitamins (B COMPLEX PO) Take 1 tablet by mouth daily.     Cholecalciferol (VITAMIN D-3) 25 MCG (1000 UT) CAPS Take 1 capsule by mouth 2 (two) times daily.     cloNIDine (CATAPRES) 0.2 MG tablet Take 0.2 mg by mouth 2 (two) times daily.     fenofibrate 160 MG tablet Take 160 mg by mouth at bedtime.     furosemide (LASIX) 20 MG tablet Take 20 mg by mouth 2 (two) times daily. Taking 1/2 in the morning and 1/2 at night     gabapentin (NEURONTIN) 300 MG capsule Take 900 mg by mouth 3 (three) times daily. Takes 3 caps twice daily and third time takes 4 caps     insulin glargine (LANTUS SOLOSTAR) 100 UNIT/ML Solostar Pen Inject 60 Units into the skin at bedtime. (Patient taking differently: Inject 60 Units into the skin at bedtime.) 60 mL 0   Multiple Vitamin (MULTIVITAMIN WITH MINERALS) TABS tablet Take 1 tablet by mouth at bedtime.     Omega-3 Fatty Acids (FISH OIL PO) Take 1 tablet by mouth daily.     pantoprazole (PROTONIX) 40 MG tablet Take 1 tablet by mouth daily.     tirzepatide (MOUNJARO) 10 MG/0.5ML Pen Inject 10 mg into the skin once a week. Friday's     alfuzosin (UROXATRAL) 10 MG 24 hr tablet Take 1 tablet (10 mg total) by mouth daily. (Patient taking differently: Take 10 mg by mouth at bedtime.) 90 tablet 3   No current facility-administered medications for this visit.    PHYSICAL EXAMINATION: Vitals:   02/10/23 1519  BP: (!) 168/72  Pulse: (!) 50  Temp: (!) 97.2 F (36.2 C)  SpO2: 97%   Filed Weights   02/10/23 1519  Weight: 215 lb (97.5 kg)    Physical Exam Vitals reviewed.  Constitutional:      Appearance: He is obese. He is not ill-appearing.     Comments:    Pulmonary:     Effort: No respiratory distress.     Comments: Decreased breath sounds bilaterally.  Musculoskeletal:        General: Normal range of motion.     Comments: Ambulates w/o aids  Skin:     Coloration: Skin is not pale.  Neurological:     Mental Status: He is alert and oriented to person, place, and time.  Psychiatric:        Mood and Affect: Mood normal.        Behavior: Behavior normal.     LABORATORY DATA:  I have reviewed the data as listed Lab Results  Component Value Date   WBC 4.5 02/10/2023   HGB 9.9 (L) 02/10/2023   HCT 31.1 (L) 02/10/2023  MCV 88.9 02/10/2023   PLT 190 02/10/2023   Recent Labs    01/12/23 1416 02/02/23 1441 02/03/23 1849  NA 134* 134* 137  K 4.2 3.6 3.6  CL 102 105 108  CO2 23 23 20*  GLUCOSE 345* 295* 155*  BUN 57* 31* 32*  CREATININE 2.31* 1.87* 1.89*  CALCIUM 9.2 8.4* 8.3*  GFRNONAA 31* 40* 40*  PROT  --  6.0*  --   ALBUMIN  --  3.1*  --   AST  --  22  --   ALT  --  16  --   ALKPHOS  --  62  --   BILITOT  --  0.4  --    Iron/TIBC/Ferritin/ %Sat    Component Value Date/Time   IRON 64 02/02/2023 1441   TIBC 344 02/02/2023 1441   FERRITIN 325 02/02/2023 1441   IRONPCTSAT 19 02/02/2023 1441     DG Chest 2 View  Result Date: 02/03/2023 CLINICAL DATA:  Shortness of breath. EXAM: CHEST - 2 VIEW COMPARISON:  April 22, 2021 FINDINGS: The heart size and mediastinal contours are within normal limits. Very mild linear atelectasis is seen within the left lung base. There is no evidence of an acute infiltrate, pleural effusion or pneumothorax. The visualized skeletal structures are unremarkable. IMPRESSION: No active cardiopulmonary disease. Electronically Signed   By: Virgina Norfolk M.D.   On: 02/03/2023 17:22    Assessment & Plan:  No problem-specific Assessment & Plan notes found for this encounter.  # Anemia- multifactorial etiology- iron malabsorption in setting of celiac & CKD stage III. Symptomatic.   # Anemia of CKD- stage III. GFR 30-40. Managed by nephrology in [place]. Addtl hx of diabetes. Hemoglobin dropped to < 10 consistently and he was started on erythropoietin/retacrit 20,000 units last week. Reviewed that  goal is to minimize need for transfusions. Goal hemoglobin > 10. Hmg 9.9 today. Plan to start retacrit 20,000 units monthly for now. May need to transition to every 2 weeks or adjust dose in the future, particularly if kidney function worsens. Cr 2.24 today.   # Iron Deficiency Anemia- d/t malabsorption d/t celiac disease. Has required IV iron every couple of months and will likely need this life long. Optimize iron studies in setting of CKD. Goal iron sat > 20%, Goal ferritin > 50. 02/02/23- Ferritin 325, iron sat 19%. Last received venofer 01/12/23. Plan for venofer every 2-3 months.   # Diabetes- managed by [facility]. HbA1c 7.4. Glucose 238 today. Encouraged close monitoring of sugars, dietary discretion, and follow up with endo.   Disposition:  1 mo- lab (H&H), +/- retacrit 2 mo- lab (cbc, cmp, ferritin, iron studies), Dr Rogue Bussing, +/- Venofer OR Retacrit- la   All questions were answered. The patient knows to call the clinic with any problems, questions or concerns.   Verlon Au, NP 02/10/2023

## 2023-02-11 ENCOUNTER — Encounter: Payer: Self-pay | Admitting: Internal Medicine

## 2023-02-22 ENCOUNTER — Telehealth (HOSPITAL_BASED_OUTPATIENT_CLINIC_OR_DEPARTMENT_OTHER): Payer: Self-pay | Admitting: Pulmonary Disease

## 2023-02-22 ENCOUNTER — Ambulatory Visit (INDEPENDENT_AMBULATORY_CARE_PROVIDER_SITE_OTHER): Payer: BC Managed Care – PPO

## 2023-02-22 DIAGNOSIS — R0683 Snoring: Secondary | ICD-10-CM

## 2023-02-22 DIAGNOSIS — G4733 Obstructive sleep apnea (adult) (pediatric): Secondary | ICD-10-CM

## 2023-02-22 NOTE — Telephone Encounter (Signed)
HST showed moderate OSA with AHI 16/ hr, low sat 70%  Due to severity of desat & his prior problems with CPAP, suggest CPAP ttiration study after which we can order CPAP

## 2023-02-24 ENCOUNTER — Telehealth: Payer: Self-pay | Admitting: Internal Medicine

## 2023-02-24 NOTE — Telephone Encounter (Signed)
Called and spoke w/ pt & wife, they verbalized understanding. CPAP titration has been order. Closing encounter.

## 2023-02-24 NOTE — Telephone Encounter (Signed)
Patients wife left a voicemail requesting a call back re: bill for medication.

## 2023-03-01 ENCOUNTER — Ambulatory Visit: Payer: BC Managed Care – PPO | Admitting: Urology

## 2023-03-10 ENCOUNTER — Ambulatory Visit (HOSPITAL_COMMUNITY)
Admission: RE | Admit: 2023-03-10 | Discharge: 2023-03-10 | Disposition: A | Discharge status: Home / Self Care | Payer: BC Managed Care – PPO | Source: Ambulatory Visit | Attending: Adult Health Nurse Practitioner | Admitting: Adult Health Nurse Practitioner

## 2023-03-10 DIAGNOSIS — I10 Essential (primary) hypertension: Secondary | ICD-10-CM | POA: Insufficient documentation

## 2023-03-10 DIAGNOSIS — E119 Type 2 diabetes mellitus without complications: Secondary | ICD-10-CM | POA: Insufficient documentation

## 2023-03-10 DIAGNOSIS — R600 Localized edema: Secondary | ICD-10-CM | POA: Diagnosis not present

## 2023-03-10 DIAGNOSIS — R001 Bradycardia, unspecified: Secondary | ICD-10-CM

## 2023-03-10 DIAGNOSIS — E785 Hyperlipidemia, unspecified: Secondary | ICD-10-CM | POA: Diagnosis not present

## 2023-03-10 DIAGNOSIS — K219 Gastro-esophageal reflux disease without esophagitis: Secondary | ICD-10-CM | POA: Insufficient documentation

## 2023-03-10 LAB — ECHOCARDIOGRAM COMPLETE
Area-P 1/2: 2.29 cm2
Calc EF: 53.9 %
S' Lateral: 3.8 cm
Single Plane A2C EF: 53.1 %
Single Plane A4C EF: 55.2 %

## 2023-03-10 NOTE — Progress Notes (Signed)
Echocardiogram 2D Echocardiogram has been performed.  Augustine Radar 03/10/2023, 11:56 AM

## 2023-03-11 ENCOUNTER — Other Ambulatory Visit: Payer: Self-pay | Admitting: *Deleted

## 2023-03-11 DIAGNOSIS — D649 Anemia, unspecified: Secondary | ICD-10-CM

## 2023-03-14 ENCOUNTER — Inpatient Hospital Stay: Payer: BC Managed Care – PPO | Attending: Internal Medicine

## 2023-03-14 ENCOUNTER — Inpatient Hospital Stay: Payer: BC Managed Care – PPO

## 2023-03-14 VITALS — BP 150/70

## 2023-03-14 DIAGNOSIS — I129 Hypertensive chronic kidney disease with stage 1 through stage 4 chronic kidney disease, or unspecified chronic kidney disease: Secondary | ICD-10-CM | POA: Insufficient documentation

## 2023-03-14 DIAGNOSIS — N183 Chronic kidney disease, stage 3 unspecified: Secondary | ICD-10-CM | POA: Insufficient documentation

## 2023-03-14 DIAGNOSIS — D631 Anemia in chronic kidney disease: Secondary | ICD-10-CM | POA: Diagnosis present

## 2023-03-14 DIAGNOSIS — D649 Anemia, unspecified: Secondary | ICD-10-CM

## 2023-03-14 LAB — HEMOGLOBIN AND HEMATOCRIT (CANCER CENTER ONLY)
HCT: 29.5 % — ABNORMAL LOW (ref 39.0–52.0)
Hemoglobin: 9.5 g/dL — ABNORMAL LOW (ref 13.0–17.0)

## 2023-03-14 MED ORDER — EPOETIN ALFA-EPBX 10000 UNIT/ML IJ SOLN
20000.0000 [IU] | Freq: Once | INTRAMUSCULAR | Status: AC
  Administered 2023-03-14: 20000 [IU] via SUBCUTANEOUS
  Filled 2023-03-14: qty 2

## 2023-03-15 ENCOUNTER — Telehealth (HOSPITAL_BASED_OUTPATIENT_CLINIC_OR_DEPARTMENT_OTHER): Payer: Self-pay | Admitting: Pulmonary Disease

## 2023-03-15 NOTE — Telephone Encounter (Signed)
Patients wife and states has not heard from anyone about setting up CPAP titration appointment. Prior encounter on 02/24/23 states it was ordered. Please advise and call patients wife back.

## 2023-03-16 ENCOUNTER — Telehealth: Payer: Self-pay | Admitting: *Deleted

## 2023-03-16 NOTE — Telephone Encounter (Signed)
Wife called stating that they have questions re the Retacrit injections and the fact that he is now having swelling in his feet and has been put on lasix for it by his kidney doctor.She is concerned because his b/p and the swelling have started since starting the injection. She is also concerned because his father had leukemia and was asking if we would ever want to do a BMBx on him

## 2023-03-16 NOTE — Telephone Encounter (Signed)
Patient wife calling again asking for an update. Asking if there is a number she can call at [facility] to schedule patient. Please advise.

## 2023-03-17 ENCOUNTER — Encounter (HOSPITAL_BASED_OUTPATIENT_CLINIC_OR_DEPARTMENT_OTHER): Payer: Self-pay | Admitting: Pulmonary Disease

## 2023-03-17 NOTE — Telephone Encounter (Signed)
Gave patient the number to call. Nothing further needed.

## 2023-03-17 NOTE — Telephone Encounter (Signed)
PT wife calling to check on this [facility] message as well. I will let them know it takes time for the PCC's to sched. I do not see an "Active Order" for another sleep study so I am routing this to [facility] and Dr. Reginia Naas office to cover bases.    Pls call PT. Or wife.

## 2023-03-17 NOTE — Telephone Encounter (Signed)
See last signed encounter. It states we gave her [facility] number. Do they set up or PCC's?

## 2023-03-21 ENCOUNTER — Encounter: Payer: Self-pay | Admitting: Internal Medicine

## 2023-03-28 ENCOUNTER — Telehealth: Payer: Self-pay | Admitting: *Deleted

## 2023-03-28 NOTE — Telephone Encounter (Signed)
Patient called and states that he still has not heard from [facility] team about getting re-scheduled for a sleep study through [facility]. Patient had a SNAP sleep study done April 2024, but it was advised to do another sleep test regarding CPAP titrations.   Please call and advise patient.  Best contact [phone] [place] to speak to wife per [facility]

## 2023-03-29 NOTE — Telephone Encounter (Signed)
Called and spoke with patients wife. She verbalized understanding.   Nothing further needed at this time.

## 2023-03-29 NOTE — Telephone Encounter (Signed)
Routing to raven to have her check on this and advise.

## 2023-03-31 ENCOUNTER — Other Ambulatory Visit: Payer: Self-pay | Admitting: *Deleted

## 2023-03-31 DIAGNOSIS — D509 Iron deficiency anemia, unspecified: Secondary | ICD-10-CM

## 2023-03-31 MED FILL — Iron Sucrose Inj 20 MG/ML (Fe Equiv): INTRAVENOUS | Qty: 10 | Status: AC

## 2023-04-01 ENCOUNTER — Inpatient Hospital Stay (HOSPITAL_BASED_OUTPATIENT_CLINIC_OR_DEPARTMENT_OTHER): Payer: BC Managed Care – PPO | Admitting: Internal Medicine

## 2023-04-01 ENCOUNTER — Inpatient Hospital Stay: Payer: BC Managed Care – PPO | Attending: Internal Medicine

## 2023-04-01 ENCOUNTER — Inpatient Hospital Stay: Payer: BC Managed Care – PPO

## 2023-04-01 ENCOUNTER — Telehealth: Payer: Self-pay

## 2023-04-01 ENCOUNTER — Encounter: Payer: Self-pay | Admitting: Internal Medicine

## 2023-04-01 VITALS — BP 144/63 | HR 62 | Temp 98.2°F | Ht 68.0 in | Wt 216.8 lb

## 2023-04-01 DIAGNOSIS — G629 Polyneuropathy, unspecified: Secondary | ICD-10-CM | POA: Diagnosis not present

## 2023-04-01 DIAGNOSIS — E1122 Type 2 diabetes mellitus with diabetic chronic kidney disease: Secondary | ICD-10-CM | POA: Insufficient documentation

## 2023-04-01 DIAGNOSIS — D649 Anemia, unspecified: Secondary | ICD-10-CM

## 2023-04-01 DIAGNOSIS — Z79899 Other long term (current) drug therapy: Secondary | ICD-10-CM | POA: Insufficient documentation

## 2023-04-01 DIAGNOSIS — M7989 Other specified soft tissue disorders: Secondary | ICD-10-CM | POA: Diagnosis not present

## 2023-04-01 DIAGNOSIS — Z808 Family history of malignant neoplasm of other organs or systems: Secondary | ICD-10-CM | POA: Insufficient documentation

## 2023-04-01 DIAGNOSIS — N183 Chronic kidney disease, stage 3 unspecified: Secondary | ICD-10-CM | POA: Diagnosis present

## 2023-04-01 DIAGNOSIS — D631 Anemia in chronic kidney disease: Secondary | ICD-10-CM | POA: Insufficient documentation

## 2023-04-01 DIAGNOSIS — D509 Iron deficiency anemia, unspecified: Secondary | ICD-10-CM

## 2023-04-01 DIAGNOSIS — I129 Hypertensive chronic kidney disease with stage 1 through stage 4 chronic kidney disease, or unspecified chronic kidney disease: Secondary | ICD-10-CM | POA: Diagnosis present

## 2023-04-01 DIAGNOSIS — M79671 Pain in right foot: Secondary | ICD-10-CM | POA: Insufficient documentation

## 2023-04-01 LAB — IRON AND TIBC
Iron: 58 ug/dL (ref 45–182)
Saturation Ratios: 23 % (ref 17.9–39.5)
TIBC: 258 ug/dL (ref 250–450)
UIBC: 200 ug/dL

## 2023-04-01 LAB — CMP (CANCER CENTER ONLY)
ALT: 20 U/L (ref 0–44)
AST: 23 U/L (ref 15–41)
Albumin: 3.1 g/dL — ABNORMAL LOW (ref 3.5–5.0)
Alkaline Phosphatase: 83 U/L (ref 38–126)
Anion gap: 8 (ref 5–15)
BUN: 32 mg/dL — ABNORMAL HIGH (ref 8–23)
CO2: 21 mmol/L — ABNORMAL LOW (ref 22–32)
Calcium: 8.3 mg/dL — ABNORMAL LOW (ref 8.9–10.3)
Chloride: 110 mmol/L (ref 98–111)
Creatinine: 2.07 mg/dL — ABNORMAL HIGH (ref 0.61–1.24)
GFR, Estimated: 36 mL/min — ABNORMAL LOW (ref >60)
Glucose, Bld: 227 mg/dL — ABNORMAL HIGH (ref 70–99)
Potassium: 3.9 mmol/L (ref 3.5–5.1)
Sodium: 139 mmol/L (ref 135–145)
Total Bilirubin: 0.8 mg/dL (ref 0.3–1.2)
Total Protein: 6.1 g/dL — ABNORMAL LOW (ref 6.5–8.1)

## 2023-04-01 LAB — CBC WITH DIFFERENTIAL (CANCER CENTER ONLY)
Abs Immature Granulocytes: 0.02 10*3/uL (ref 0.00–0.07)
Basophils Absolute: 0 10*3/uL (ref 0.0–0.1)
Basophils Relative: 1 %
Eosinophils Absolute: 0.2 10*3/uL (ref 0.0–0.5)
Eosinophils Relative: 5 %
HCT: 32.1 % — ABNORMAL LOW (ref 39.0–52.0)
Hemoglobin: 10.2 g/dL — ABNORMAL LOW (ref 13.0–17.0)
Immature Granulocytes: 0 %
Lymphocytes Relative: 15 %
Lymphs Abs: 0.7 10*3/uL (ref 0.7–4.0)
MCH: 28.7 pg (ref 26.0–34.0)
MCHC: 31.8 g/dL (ref 30.0–36.0)
MCV: 90.2 fL (ref 80.0–100.0)
Monocytes Absolute: 0.5 10*3/uL (ref 0.1–1.0)
Monocytes Relative: 11 %
Neutro Abs: 3.3 10*3/uL (ref 1.7–7.7)
Neutrophils Relative %: 68 %
Platelet Count: 216 10*3/uL (ref 150–400)
RBC: 3.56 MIL/uL — ABNORMAL LOW (ref 4.22–5.81)
RDW: 14.5 % (ref 11.5–15.5)
WBC Count: 4.8 10*3/uL (ref 4.0–10.5)
nRBC: 0 % (ref 0.0–0.2)

## 2023-04-01 LAB — FERRITIN: Ferritin: 335 ng/mL (ref 24–336)

## 2023-04-01 NOTE — Progress Notes (Signed)
[facility] CONSULT NOTE  Patient Care Team: Patient, [name] as PCP - General (General Practice) Otelia Sergeant, NP (Inactive) as Nurse Practitioner (Endocrinology) Earna Coder, MD as Consulting Physician (Oncology)  CHIEF COMPLAINTS/PURPOSE OF CONSULTATION: ANEMIA   HEMATOLOGY HISTORY:  # ANEMIA PROGRESSIVE since 2020 12; 2022- 10 MCV- 90s; colonoscopy 2015; multiple endoscopies-last September 2021-biopsy active celiac disease  #CKD stage III [[facility]]/diabetes [[facility]]; ? Celaic disease [[facility]];    - Labs: 07/2014 - tTG IgA 101 03/2020 - positive endomysial Ab IgA, tTG IgA 78 - CSY: 07/2014 - normal appearing terminal ileum, sigmoid diverticulosis, and non-bleeding internal hemorrhoids - EGD: 07/2014 - mild non-erosive gastritis, normal appearing duodenal mucosa with duodenal biopsies showing celiac sprue - EGD: 06/03/2020 - procedure aborted due to presence of food - EGD: 08/12/2020 - irregular Z-line found at [facility] with bx showing mild reflux esophagitis, 1 cm hiatal hernia, mild chronic gastritis, decreased folds found in entire duodenum, flattening found in entire duodenum and scalloped mucosa with bx showing active Celiac disease with villous atrophy, increased intraepithelial lymphocytes, etc. Repeat in 1-year.  HISTORY OF PRESENTING ILLNESS: Alone.  Ambulating independently.  Brian Nielsen 62 y.o.  male symptomatic anemia-likely secondary to chronic renal disease and other-multiple comorbidities poorly controlled diabetes-complications of CKD; peripheral neuropathy; anemia is here for follow-up.   Patient received Retacrit approximately 3 weeks ago.   Noted to have swelling of the feet.  Currently on Lasix.   Patient continues to complain of pain in right foot.  Patient continues to complains of chronic fatigue.   No fever no chills. Otherwise no nausea no vomiting.   Review of Systems  Constitutional:  Positive for malaise/fatigue  and weight loss. Negative for chills, diaphoresis and fever.  HENT:  Negative for nosebleeds and sore throat.   Eyes:  Negative for double vision.  Respiratory:  Positive for shortness of breath. Negative for cough, hemoptysis, sputum production and wheezing.   Cardiovascular:  Negative for chest pain, palpitations, orthopnea and leg swelling.  Gastrointestinal:  Negative for blood in stool, heartburn, melena, nausea and vomiting.  Genitourinary:  Negative for dysuria, frequency and urgency.  Musculoskeletal:  Positive for back pain and joint pain.  Skin: Negative.  Negative for itching and rash.  Neurological:  Positive for weakness. Negative for dizziness, tingling, focal weakness and headaches.  Endo/Heme/Allergies:  Does not bruise/bleed easily.  Psychiatric/Behavioral:  Negative for depression. The patient is not nervous/anxious and does not have insomnia.     MEDICAL HISTORY:  Past Medical History:  Diagnosis Date   Adult celiac disease 2015   followed by dr Servando Snare (GI)   Anemia associated with chronic renal failure    hematologist--- dr Sarajane Jews;  treated with iron infusions   Benign localized prostatic hyperplasia with lower urinary tract symptoms (LUTS)    urologist--- dr Retta Diones   Charcot foot due to diabetes mellitus (HCC)    Chronic constipation    CKD (chronic kidney disease), stage III [facility])    nephrologist--- dr Wolfgang Phoenix;  renal bx 11-20-2021   Diverticulosis of colon    Edema of both lower extremities    GERD (gastroesophageal reflux disease)    History of adenomatous polyp of colon    HTN (hypertension)    Hyperlipidemia, mixed    Peripheral neuropathy    Peyronie's disease    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 meatal stricture    Retinopathy due to secondary diabetes mellitus (HCC)    both eyes  treated with injecitons  Thoracic spondylosis without myelopathy 11/22/2016   Type 2 diabetes mellitus [facility])    endocrinologist--- dr nida   Vitamin D deficiency     Wears hearing aid in both ears     SURGICAL HISTORY: Past Surgical History:  Procedure Laterality Date   BIOPSY N/A 09/07/2021   Procedure: BIOPSY;  Surgeon: Midge Minium, MD;  Location[facility];  Service: Endoscopy;  Laterality: N/A;   CATARACT EXTRACTION W/ INTRAOCULAR LENS IMPLANT Bilateral 2023   COLONOSCOPY N/A 05/03/2014   Procedure: COLONOSCOPY;  Surgeon: West Bali, MD;  [facility];  Service: Endoscopy;  Laterality: N/A;  12:00   COLONOSCOPY WITH PROPOFOL N/A 09/07/2021   Procedure: COLONOSCOPY WITH PROPOFOL;  Surgeon: Midge Minium, MD;  Location: [facility];  Service: Endoscopy;  Laterality: N/A;   CYSTOSCOPY WITH URETHRAL DILATATION N/A 01/10/2023   Procedure: CYSTOSCOPY;  Surgeon: Marcine Matar, MD;  Location: [facility];  Service: Urology;  Laterality: N/A;   ESOPHAGOGASTRODUODENOSCOPY N/A 05/03/2014   Procedure: ESOPHAGOGASTRODUODENOSCOPY (EGD);  Surgeon: West Bali, MD;  [facility];  Service: Endoscopy;  Laterality: N/A;   ESOPHAGOGASTRODUODENOSCOPY (EGD) WITH PROPOFOL N/A 06/03/2020   Procedure: ESOPHAGOGASTRODUODENOSCOPY (EGD) WITH PROPOFOL;  Surgeon: Regis Bill, MD;  Location: [facility];  Service: Endoscopy;  Laterality: N/A;   ESOPHAGOGASTRODUODENOSCOPY (EGD) WITH PROPOFOL N/A 09/07/2021   Procedure: ESOPHAGOGASTRODUODENOSCOPY (EGD) WITH PROPOFOL;  Surgeon: Midge Minium, MD;  Location: [facility];  Service: Endoscopy;  Laterality: N/A;  Diabetic   MEATOTOMY N/A 01/10/2023   Procedure: MEATOTOMY ADULT;  Surgeon: Marcine Matar, MD;  Location: [facility];  Service: Urology;  Laterality: N/A;   PARS PLANA VITRECTOMY Right 03/21/2021   Procedure: PARS PLANA VITRECTOMY 25 GAUGE WITH INJECTION OF ANTIBIOTICS FOR ENDOPHTHALMITIS;  Surgeon: Carmela Rima, MD;  Location: [facility];  Service: Ophthalmology;  Laterality: Right;   POLYPECTOMY N/A 09/07/2021   Procedure:  POLYPECTOMY;  Surgeon: Midge Minium, MD;  Location: [facility];  Service: Endoscopy;  Laterality: N/A;   REPAIR EXTENSOR TENDON Right 08/11/2022   Procedure: EXTENSOR CARPI ULNARIS TENDON DEBRIDEMENT, POSSIBLE SUBSHEATH RECONSTRUCTION;  Surgeon: Marlyne Beards, MD;  Location: [facility];  Service: Orthopedics;  Laterality: Right;  or regional plus MAC    SOCIAL HISTORY: Social History   Socioeconomic History   Marital status: Married    Spouse name: [name]   Number of children: 1   Years of education: 12   Highest education level: Not on file  Occupational History   Occupation: Architect: [employer]    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  Tobacco Use   Smoking status: Never   Smokeless tobacco: Current    Types: Snuff  Vaping Use   Vaping Use: Never used  Substance and Sexual Activity   Alcohol use: Not Currently    Comment: seldom   Drug use: Never   Sexual activity: Yes    Birth control/protection: Surgical  Other Topics Concern   Not on file  Social History Narrative   Lives with wife   [place]   Caffeine- diet Mtn Dew, 2 glasses   Social Determinants of Health   Financial Resource Strain: Not on file  Food Insecurity: Not on file  Transportation Needs: Not on file  Physical Activity: Not on file  Stress: Not on file  Social Connections: Not on file  Intimate Partner Violence: Not on file    FAMILY HISTORY: Family History  Problem Relation Age of Onset   Aneurysm Mother        brain   Hypertension Mother    Cancer Father    Heart disease Father    Diabetes Father    Hypertension Father    Hyperlipidemia Father    Cancer Maternal Grandmother        melanoma   Heart disease Paternal Grandfather    Colon cancer Neg Hx     ALLERGIES:  is allergic to duloxetine, gluten meal, and codeine.  MEDICATIONS:  Current Outpatient Medications  Medication Sig Dispense Refill   aspirin 81  MG EC tablet Take 81 mg by mouth daily.     atorvastatin (LIPITOR) 40 MG tablet Take 1 tablet (40 mg total) by mouth daily. (Patient taking differently: Take 40 mg by mouth daily.) 90 tablet 3   carvedilol (COREG) 3.125 MG tablet Take 3.125 mg by mouth 2 (two) times daily with a meal. Takes 2 bid.     cloNIDine (CATAPRES) 0.2 MG tablet Take 0.2 mg by mouth 2 (two) times daily.     furosemide (LASIX) 20 MG tablet Take 20 mg by mouth 2 (two) times daily.     gabapentin (NEURONTIN) 300 MG capsule Take 900 mg by mouth 3 (three) times daily. Takes 3 caps twice daily and third time takes 4 caps     insulin glargine (LANTUS SOLOSTAR) 100 UNIT/ML Solostar Pen Inject 60 Units into the skin at bedtime. (Patient taking differently: Inject 36 Units into the skin 2 (two) times daily.) 60 mL 0   JARDIANCE 10 MG TABS tablet Take 25 mg by mouth every morning.     lisinopril (ZESTRIL) 20 MG tablet Take 20 mg by mouth daily.     pantoprazole (PROTONIX) 40 MG tablet Take 1 tablet by mouth daily.     tamsulosin (FLOMAX) 0.4 MG CAPS capsule Take 0.4 mg by mouth at bedtime.     tirzepatide (MOUNJARO) 10 MG/0.5ML Pen Inject 10 mg into the skin once a week. Friday's     No current facility-administered medications for this visit.      PHYSICAL EXAMINATION:   Vitals:   04/01/23 1431  BP: (!) 144/63  Pulse: 62  Temp: 98.2 F (36.8 C)  SpO2: 98%   Filed Weights   04/01/23 1431  Weight: 216 lb 12.8 oz (98.3 kg)    Physical Exam Vitals and nursing note reviewed.  Constitutional:      Comments:    HENT:     Head: Normocephalic and atraumatic.     Mouth/Throat:     Pharynx: Oropharynx is clear.  Eyes:     Extraocular Movements: Extraocular movements intact.     Pupils: Pupils are equal, round, and reactive to light.  Cardiovascular:     Rate and Rhythm: Normal rate and regular rhythm.  Pulmonary:     Comments: Decreased breath sounds bilaterally.  Abdominal:     Palpations: Abdomen is soft.   Musculoskeletal:        General: Normal range of motion.     Cervical back: Normal range of motion.  Skin:    General: Skin is warm.  Neurological:     General: No focal deficit present.     Mental Status: He is alert and oriented to person, place, and time.  Psychiatric:        Behavior: Behavior normal.        Judgment: Judgment normal.     LABORATORY DATA:  I have reviewed the data  as listed Lab Results  Component Value Date   WBC 4.8 04/01/2023   HGB 10.2 (L) 04/01/2023   HCT 32.1 (L) 04/01/2023   MCV 90.2 04/01/2023   PLT 216 04/01/2023   Recent Labs    02/02/23 1441 02/03/23 1849 02/10/23 1510 04/01/23 1433  NA 134* 137 138 139  K 3.6 3.6 3.7 3.9  CL 105 108 107 110  CO2 23 20* 25 21*  GLUCOSE 295* 155* 238* 227*  BUN 31* 32* 42* 32*  CREATININE 1.87* 1.89* 2.24* 2.07*  CALCIUM 8.4* 8.3* 8.3* 8.3*  GFRNONAA 40* 40* 33* 36*  PROT 6.0*  --   --  6.1*  ALBUMIN 3.1*  --   --  3.1*  AST 22  --   --  23  ALT 16  --   --  20  ALKPHOS 62  --   --  83  BILITOT 0.4  --   --  0.8     ECHOCARDIOGRAM COMPLETE  Result Date: 03/10/2023    ECHOCARDIOGRAM REPORT   Patient Name:   RAYMIE PENTA Date of Exam: 03/10/2023 Medical Rec #:  [id number]    Height:       68.0 in Accession #:    [id number]   Weight:       215.0 lb Date of Birth:  Dec 20, 1960   BSA:          2.108 m Patient Age:    61 years     BP:           168/72 mmHg Patient Gender: M            HR:           48 bpm. Exam Location:  Outpatient Procedure: 2D Echo, Cardiac Doppler and Color Doppler Indications:    Bradycardia [[id number]]                 Lower extremity edema [[id number]]  History:        Patient has no prior history of Echocardiogram examinations.                 Arrythmias:Bradycardia, Signs/Symptoms:Edema; Risk                 Factors:Dyslipidemia, Hypertension, Diabetes and GERD.  Sonographer:    Eulah Pont RDCS Referring Phys: [id number] COURTNEY KEATTS IMPRESSIONS  1. Left ventricular ejection fraction, by  estimation, is 60 to 65%. The left ventricle has normal function. The left ventricle has no regional wall motion abnormalities. Left ventricular diastolic parameters were normal.  2. Right ventricular systolic function is normal. The right ventricular size is normal. Tricuspid regurgitation signal is inadequate for assessing PA pressure.  3. The mitral valve is normal in structure. Trivial mitral valve regurgitation.  4. The aortic valve is grossly normal. Aortic valve regurgitation is not visualized.  5. The inferior vena cava is dilated in size with >50% respiratory variability, suggesting right atrial pressure of 8 mmHg. FINDINGS  Left Ventricle: Left ventricular ejection fraction, by estimation, is 60 to 65%. The left ventricle has normal function. The left ventricle has no regional wall motion abnormalities. The left ventricular internal cavity size was normal in size. There is  no left ventricular hypertrophy. Left ventricular diastolic parameters were normal. Right Ventricle: The right ventricular size is normal. Right ventricular systolic function is normal. Tricuspid regurgitation signal is inadequate for assessing PA pressure. Left Atrium: Left atrial size was normal in size. Right Atrium: Right atrial size was  normal in size. Pericardium: There is no evidence of pericardial effusion. Mitral Valve: The mitral valve is normal in structure. Trivial mitral valve regurgitation. Tricuspid Valve: Tricuspid valve regurgitation is not demonstrated. Aortic Valve: The aortic valve is grossly normal. Aortic valve regurgitation is not visualized. Pulmonic Valve: Pulmonic valve regurgitation is trivial. Aorta: The aortic root and ascending aorta are structurally normal, with no evidence of dilitation. Venous: The inferior vena cava is dilated in size with greater than 50% respiratory variability, suggesting right atrial pressure of 8 mmHg. IAS/Shunts: No atrial level shunt detected by color flow Doppler.  LEFT  VENTRICLE PLAX 2D LVIDd:         5.80 cm      Diastology LVIDs:         3.80 cm      LV e' medial:    6.06 cm/s LV PW:         1.00 cm      LV E/e' medial:  11.9 LV IVS:        1.00 cm      LV e' lateral:   7.87 cm/s LVOT diam:     2.20 cm      LV E/e' lateral: 9.2 LV SV:         94 LV SV Index:   44 LVOT Area:     3.80 cm  LV Volumes (MOD) LV vol d, MOD A2C: 149.0 ml LV vol d, MOD A4C: 150.0 ml LV vol s, MOD A2C: 69.9 ml LV vol s, MOD A4C: 67.2 ml LV SV MOD A2C:     79.1 ml LV SV MOD A4C:     150.0 ml LV SV MOD BP:      81.5 ml RIGHT VENTRICLE RV S prime:     9.87 cm/s TAPSE (M-mode): 2.4 cm LEFT ATRIUM             Index        RIGHT ATRIUM           Index LA diam:        4.40 cm 2.09 cm/m   RA Area:     16.70 cm LA Vol (A2C):   64.9 ml 30.79 ml/m  RA Volume:   47.80 ml  22.68 ml/m LA Vol (A4C):   62.2 ml 29.51 ml/m LA Biplane Vol: 65.9 ml 31.27 ml/m  AORTIC VALVE             PULMONIC VALVE LVOT Vmax:   91.90 cm/s  PR End Diast Vel: 5.66 msec LVOT Vmean:  58.800 cm/s LVOT VTI:    0.246 m  AORTA Ao Root diam: 3.70 cm Ao Asc diam:  3.30 cm MITRAL VALVE MV Area (PHT): 2.29 cm    SHUNTS MV Decel Time: 332 msec    Systemic VTI:  0.25 m MV E velocity: 72.40 cm/s  Systemic Diam: 2.20 cm MV A velocity: 53.30 cm/s MV E/A ratio:  1.36 Photographer signed by Carolan Clines Signature Date/Time: 03/10/2023/7:15:29 PM    Final     Symptomatic anemia #Anemia-symptomatic fatigue hemoglobin 9-10--likely multifactorial-iron malabsorption/celiac disease/CKD-stage III-status post Venofer; Retacrit as needed to keep hemoglobin greater than 10  # Hemoglobin today is 10.2; status post retacrit approximately 3 weeks ago.  MARCH 2024-iron saturation- 19; ferritin 300- HOLD retacrit; venofer today  Patient wife concerned about other causes of anemia including malignancy.  I think is reasonable to proceed with bone marrow biopsy given their concerns.  Discussed with the patient the bone  marrow biopsy and aspiration  indication and procedure at length.  Given significant discomfort involved-I would recommend under anesthesia/with radiology in the hospital. I discussed the potential complications include-bleeding/trauma and risk of infection; which are fortunately very rare.  Patient is in agreement. Bone marrow biopsy/aspiration is ordered.   # Ckd stage III [GFR- 31; nephro, [place]]-diabetes- GFR-33 stable.    # Diabetes [[facility] Hb A1c- 7.4]-PBG 353 [steroid injectio]-continue follow-up with endocrinology/monitoring of blood sugars. Poorly controlled.   # DISPOSITION: # bone marrow Biopsy- in 2 weeks-  # NO RETACRIT today;  # NO Venofer today # follow up in 4 weeks-  MD; labs- cbc/bmp;iron studies;ferritin-;possible venofer or retacrit -Dr.B  All questions were answered. The patient knows to call the clinic with any problems, questions or concerns.    Earna Coder, MD 04/01/2023 3:35 PM

## 2023-04-01 NOTE — Telephone Encounter (Signed)
Order for bone marrow biopsy faxed to specialty sch, [phone], confirmation received.   Will call pt with date and time once it is received.

## 2023-04-01 NOTE — Progress Notes (Signed)
Fatigue/weakness: yes Dyspena: no Light headedness: at times when standing Blood in stool: no  C/o swelling in feet.

## 2023-04-01 NOTE — Assessment & Plan Note (Addendum)
#  Anemia-symptomatic fatigue hemoglobin 9-10--likely multifactorial-iron malabsorption/celiac disease/CKD-stage III-status post Venofer; Retacrit as needed to keep hemoglobin greater than 10  # Hemoglobin today is 10.2; status post retacrit approximately 3 weeks ago.  MARCH 2024-iron saturation- 19; ferritin 300- HOLD retacrit; venofer today  Patient wife concerned about other causes of anemia including malignancy.  I think is reasonable to proceed with bone marrow biopsy given their concerns.  Discussed with the patient the bone marrow biopsy and aspiration indication and procedure at length.  Given significant discomfort involved-I would recommend under anesthesia/with radiology in the hospital. I discussed the potential complications include-bleeding/trauma and risk of infection; which are fortunately very rare.  Patient is in agreement. Bone marrow biopsy/aspiration is ordered.   # Ckd stage III [GFR- 31; nephro, [place]]-diabetes- GFR-33 stable.    # Diabetes [[facility] Hb A1c- 7.4]-PBG 353 [steroid injectio]-continue follow-up with endocrinology/monitoring of blood sugars. Poorly controlled.   # DISPOSITION: # bone marrow Biopsy- in 2 weeks-  # NO RETACRIT today;  # NO Venofer today # follow up in 4 weeks-  MD; labs- cbc/bmp;iron studies;ferritin-;possible venofer or retacrit -Dr.B

## 2023-04-04 ENCOUNTER — Telehealth: Payer: Self-pay | Admitting: *Deleted

## 2023-04-04 NOTE — Telephone Encounter (Signed)
Port a cath placement scheduled for 04/19/2023  Arrive at 0830 am for 1330 appointment Come into the heart and vascular entrance at [facility]. This is a new entrance and is located in front of the hospital where the old visitor entrance and [place] [facility] was located. Do not eat or drink anything after midnight You will need a driver for this procedure You may take your blood pressure, pain, and seizure medications with a sip of water the morning of your procedure.   Called and reviewed instructions with Brian Nielsen wife. All questions answered

## 2023-04-04 NOTE — Telephone Encounter (Signed)
Wife called me back after accepting appt for BM Bx and cannot due June 4th. IR offered 6/6 and wife cannot do that. Requesting Marylu Lund in IR call wife to schedule a time.

## 2023-04-04 NOTE — Telephone Encounter (Signed)
Wife was able to confirm BM Bx date of 04/27/23

## 2023-04-05 ENCOUNTER — Ambulatory Visit: Payer: BC Managed Care – PPO | Admitting: "Endocrinology

## 2023-04-12 ENCOUNTER — Ambulatory Visit: Payer: BC Managed Care – PPO | Admitting: Internal Medicine

## 2023-04-12 ENCOUNTER — Other Ambulatory Visit: Payer: BC Managed Care – PPO

## 2023-04-12 ENCOUNTER — Ambulatory Visit: Payer: BC Managed Care – PPO

## 2023-04-19 ENCOUNTER — Ambulatory Visit: Payer: BC Managed Care – PPO | Admitting: Radiology

## 2023-04-25 NOTE — H&P (Signed)
Chief Complaint: Patient was seen in consultation today for anemia at the request of Nielsen,Brian R  Referring Physician(s): Earna Coder  Supervising Physician: Oley Balm  Patient Status: [facility] - Out-pt  History of Present Illness:  Brian Nielsen is a 62 y.o. male referred to oncology by PCP for progressive anemia.  Patient reports symptomatic anemia with fatigue with Hgb of 9-10.  Patient referred to IR for bone marrow biopsy and aspiration for further evaluation of symptomatic anemia.  Patient denies fever, chills, SOB, CP, abdominal pain, dizziness, headaches or weakness. He endorses fatigue and bilateral lower extremity swelling. He is n.p.o. per order.  Past Medical History:  Diagnosis Date   Adult celiac disease 2015   followed by dr Servando Snare (GI)   Anemia associated with chronic renal failure    hematologist--- dr Sarajane Jews;  treated with iron infusions   Benign localized prostatic hyperplasia with lower urinary tract symptoms (LUTS)    urologist--- dr Retta Diones   Charcot foot due to diabetes mellitus (HCC)    Chronic constipation    CKD (chronic kidney disease), stage III [facility])    nephrologist--- dr Wolfgang [place];  renal bx 11-20-2021   Diverticulosis of colon    Edema of both lower extremities    GERD (gastroesophageal reflux disease)    History of adenomatous polyp of colon    HTN (hypertension)    Hyperlipidemia, mixed    Peripheral neuropathy    Peyronie's disease    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 meatal stricture    Retinopathy due to secondary diabetes mellitus (HCC)    both eyes  treated with injecitons   Thoracic spondylosis without myelopathy 11/22/2016   Type 2 diabetes mellitus [facility])    endocrinologist--- dr nida   Vitamin D deficiency    Wears hearing aid in both ears     Past Surgical History:  Procedure Laterality Date   BIOPSY N/A 09/07/2021   Procedure: BIOPSY;  Surgeon: Midge Minium, MD;  Location: [facility];   Service: Endoscopy;  Laterality: N/A;   CATARACT EXTRACTION W/ INTRAOCULAR LENS IMPLANT Bilateral 2023   COLONOSCOPY N/A 05/03/2014   Procedure: COLONOSCOPY;  Surgeon: West Bali, MD;  [facility];  Service: Endoscopy;  Laterality: N/A;  12:00   COLONOSCOPY WITH PROPOFOL N/A 09/07/2021   Procedure: COLONOSCOPY WITH PROPOFOL;  Surgeon: Midge Minium, MD;  Location: [facility];  Service: Endoscopy;  Laterality: N/A;   CYSTOSCOPY WITH URETHRAL DILATATION N/A 01/10/2023   Procedure: CYSTOSCOPY;  Surgeon: Marcine Matar, MD;  Location: [facility];  Service: Urology;  Laterality: N/A;   ESOPHAGOGASTRODUODENOSCOPY N/A 05/03/2014   Procedure: ESOPHAGOGASTRODUODENOSCOPY (EGD);  Surgeon: West Bali, MD;  [facility];  Service: Endoscopy;  Laterality: N/A;   ESOPHAGOGASTRODUODENOSCOPY (EGD) WITH PROPOFOL N/A 06/03/2020   Procedure: ESOPHAGOGASTRODUODENOSCOPY (EGD) WITH PROPOFOL;  Surgeon: Regis Bill, MD;  Location: [facility] ENDOS[facility];  Service: Endoscopy;  Laterality: N/A;   ESOPHAGOGASTRODUODENOSCOPY (EGD) WITH PROPOFOL N/A 09/07/2021   Procedure: ESOPHAGOGASTRODUODENOSCOPY (EGD) WITH PROPOFOL;  Surgeon: Midge Minium, MD;  Location: [facility];  Service: Endoscopy;  Laterality: N/A;  Diabetic   MEATOTOMY N/A 01/10/2023   Procedure: MEATOTOMY ADULT;  Surgeon: Marcine Matar, MD;  Location: [facility];  Service: Urology;  Laterality: N/A;   PARS PLANA VITRECTOMY Right 03/21/2021   Procedure: PARS PLANA VITRECTOMY 25 GAUGE WITH INJECTION OF ANTIBIOTICS FOR ENDOPHTHALMITIS;  Surgeon: Carmela Rima, MD;  Location: [facility] OR;  Service: Ophthalmology;  Laterality: Right;   POLYPECTOMY  N/A 09/07/2021   Procedure: POLYPECTOMY;  Surgeon: Midge Minium, MD;  Location: [facility];  Service: Endoscopy;  Laterality: N/A;   REPAIR EXTENSOR TENDON Right 08/11/2022   Procedure: EXTENSOR CARPI ULNARIS TENDON DEBRIDEMENT,  POSSIBLE SUBSHEATH RECONSTRUCTION;  Surgeon: Marlyne Beards, MD;  Location:  SURGERY CENTER;  Service: Orthopedics;  Laterality: Right;  or regional plus MAC    Allergies: Duloxetine, Gluten meal, and Codeine  Medications: Prior to Admission medications   Medication Sig Start Date End Date Taking? Authorizing Provider  aspirin 81 MG EC tablet Take 81 mg by mouth daily.    [provider]  atorvastatin (LIPITOR) 40 MG tablet Take 1 tablet (40 mg total) by mouth daily. Patient taking differently: Take 40 mg by mouth daily. 10/21/16   Eustace Moore, MD  carvedilol (COREG) 3.125 MG tablet Take 3.125 mg by mouth 2 (two) times daily with a meal. Takes 2 bid. 11/11/22   [provider]  cloNIDine (CATAPRES) 0.2 MG tablet Take 0.2 mg by mouth 2 (two) times daily. 12/15/22 04/14/23  [provider]  furosemide (LASIX) 20 MG tablet Take 20 mg by mouth 2 (two) times daily. 01/28/23   [provider]  gabapentin (NEURONTIN) 300 MG capsule Take 900 mg by mouth 3 (three) times daily. Takes 3 caps twice daily and third time takes 4 caps 02/16/22   [provider]  insulin glargine (LANTUS SOLOSTAR) 100 UNIT/ML Solostar Pen Inject 60 Units into the skin at bedtime. Patient taking differently: Inject 36 Units into the skin 2 (two) times daily. 01/04/23   Roma Kayser, MD  JARDIANCE 10 MG TABS tablet Take 25 mg by mouth every morning.    [provider]  lisinopril (ZESTRIL) 20 MG tablet Take 20 mg by mouth daily. 03/04/23   [provider]  pantoprazole (PROTONIX) 40 MG tablet Take 1 tablet by mouth daily. 11/23/21   [provider]  tamsulosin (FLOMAX) 0.4 MG CAPS capsule Take 0.4 mg by mouth at bedtime.    [provider]  tirzepatide Greggory Keen) 10 MG/0.5ML Pen Inject 10 mg into the skin once a week. Friday's    [provider]     Family History  Problem Relation Age of Onset   Aneurysm Mother         brain   Hypertension Mother    Cancer Father    Heart disease Father    Diabetes Father    Hypertension Father    Hyperlipidemia Father    Cancer Maternal Grandmother        melanoma   Heart disease Paternal Grandfather    Colon cancer Neg Hx     Social History   Socioeconomic History   Marital status: Married    Spouse name: [name]   Number of children: 1   Years of education: 12   Highest education level: Not on file  Occupational History   Occupation: Architect: [employer]    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  Tobacco Use   Smoking status: Never   Smokeless tobacco: Current    Types: Snuff  Vaping Use   Vaping Use: Never used  Substance and Sexual Activity   Alcohol use: Not Currently    Comment: seldom   Drug use: Never   Sexual activity: Yes    Birth control/protection: Surgical  Other Topics Concern   Not on file  Social History Narrative   Lives with wife   [place]   Caffeine- diet Mtn Dew, 2 glasses   Social Determinants of Health   Financial Resource Strain: Not on file  Food Insecurity: Not on file  Transportation Needs: Not on file  Physical Activity: Not on file  Stress: Not on file  Social Connections: Not on file     Review of Systems: A 12 point ROS discussed and pertinent positives are indicated in the HPI above.  All other systems are negative.  Review of Systems  Constitutional:  Positive for fatigue. Negative for chills and fever.  Respiratory:  Negative for shortness of breath.   Cardiovascular:  Positive for leg swelling. Negative for chest pain.  Gastrointestinal:  Negative for abdominal pain, nausea and vomiting.  Neurological:  Negative for dizziness, weakness and headaches.    Vital Signs: BP (!) 157/73   Pulse (!) 57   Temp 97.9 F (36.6 C) (Oral)   Resp 17   Ht 5\' 8"  (1.727 m)   Wt 200 lb (90.7 kg)   SpO2 97%   BMI 30.41 kg/m   Patient is a full CODE  STATUS.  Physical Exam Vitals reviewed.  Constitutional:      General: He is not in acute distress.    Appearance: Normal appearance. He is not ill-appearing.  HENT:     Head: Normocephalic and atraumatic.     Mouth/Throat:     Mouth: Mucous membranes are dry.     Pharynx: Oropharynx is clear.  Eyes:     Extraocular Movements: Extraocular movements intact.     Pupils: Pupils are equal, round, and reactive to light.  Cardiovascular:     Rate and Rhythm: Regular rhythm. Bradycardia present.     Pulses: Normal pulses.     Heart sounds: Normal heart sounds.  Pulmonary:     Effort: Pulmonary effort is normal. No respiratory distress.     Breath sounds: Normal breath sounds.  Abdominal:     General: Bowel sounds are normal. There is no distension.     Palpations: Abdomen is soft.     Tenderness: There is no abdominal tenderness. There is no guarding.  Musculoskeletal:     Right lower leg: Edema present.     Left lower leg: Edema present.  Skin:    General: Skin is warm and dry.  Neurological:     Mental Status: He is alert and oriented to person, place, and time.  Psychiatric:        Mood and Affect: Mood normal.        Behavior: Behavior normal.        Thought Content: Thought content normal.        Judgment: Judgment normal.     Imaging: No results found.  Labs:  CBC: Recent Labs    02/03/23 1849 02/10/23 1510 03/14/23 1458 04/01/23 1433 04/27/23 0751  WBC 4.5 4.5  --  4.8 4.5  HGB 9.9* 9.9* 9.5* 10.2* 10.2*  HCT 30.7* 31.1* 29.5* 32.1* 33.4*  PLT 193 190  --  216 169    COAGS: No results for input(s): "INR", "APTT" in the last 8760 hours.  BMP: Recent Labs    02/02/23 1441 02/03/23 1849 02/10/23 1510 04/01/23 1433  NA 134* 137 138 139  K 3.6 3.6 3.7 3.9  CL 105 108 107 110  CO2 23 20* 25 21*  GLUCOSE 295* 155* 238* 227*  BUN 31* 32* 42* 32*  CALCIUM 8.4* 8.3* 8.3* 8.3*  CREATININE 1.87* 1.89* 2.24* 2.07*  GFRNONAA 40* 40*  33* 36*    LIVER  FUNCTION TESTS: Recent Labs    02/02/23 1441 04/01/23 1433  BILITOT 0.4 0.8  AST 22 23  ALT 16 20  ALKPHOS 62 83  PROT 6.0* 6.1*  ALBUMIN 3.1* 3.1*    TUMOR MARKERS: No results for input(s): "AFPTM", "CEA", "CA199", "CHROMGRNA" in the last 8760 hours.  Assessment and Plan:  62 year old male with PMHx significant for CKD stage III, DM type II, BPS with LUTS, lower extremity edema, GERD, HTN, HLD, peripheral neuropathy, retinopathy due to secondary DM and symptomatic anemia presents to IR for bone marrow biopsy and aspiration.  Resting in bed with wife at bedside.  Patient was seen alongside Dr. Deanne Coffer, IR. He is alert and oriented, calm and pleasant. He is in no distress.  Risks and benefits of bone marrow biopsy and aspiration with moderate sedation was discussed with the patient and/or patient's family including, but not limited to bleeding, infection, damage to adjacent structures or low yield requiring additional tests.  All of the questions were answered and there is agreement to proceed.  Consent signed and in chart.  Thank you for this interesting consult.  I greatly enjoyed meeting FEDOR LONES and look forward to participating in their care.  A copy of this report was sent to the requesting provider on this date.  Electronically Signed: Shon Hough, NP 04/27/2023, 8:27 AM   I spent a total of 20 minutes in face to face in clinical consultation, greater than 50% of which was counseling/coordinating care for anemia.

## 2023-04-26 ENCOUNTER — Other Ambulatory Visit: Payer: Self-pay | Admitting: Internal Medicine

## 2023-04-26 DIAGNOSIS — D649 Anemia, unspecified: Secondary | ICD-10-CM

## 2023-04-26 NOTE — Progress Notes (Addendum)
Patient for IR Bone Marrow Biopsy on Wed 04/27/2023, I called and spoke with the patient on the phone and gave pre-procedure instructions. Pt was made aware to be here at 7:30a, NPO after MN prior to procedure as well as driver post procedure/recovery/discharge. Pt stated understanding.  Called 04/26/2023

## 2023-04-27 ENCOUNTER — Ambulatory Visit
Admission: RE | Admit: 2023-04-27 | Discharge: 2023-04-27 | Disposition: A | Discharge status: Home / Self Care | Payer: BC Managed Care – PPO | Source: Ambulatory Visit | Attending: Internal Medicine | Admitting: Internal Medicine

## 2023-04-27 ENCOUNTER — Encounter: Payer: Self-pay | Admitting: Radiology

## 2023-04-27 ENCOUNTER — Other Ambulatory Visit: Payer: Self-pay

## 2023-04-27 DIAGNOSIS — D649 Anemia, unspecified: Secondary | ICD-10-CM

## 2023-04-27 DIAGNOSIS — E11319 Type 2 diabetes mellitus with unspecified diabetic retinopathy without macular edema: Secondary | ICD-10-CM | POA: Diagnosis not present

## 2023-04-27 DIAGNOSIS — D72810 Lymphocytopenia: Secondary | ICD-10-CM | POA: Insufficient documentation

## 2023-04-27 DIAGNOSIS — R600 Localized edema: Secondary | ICD-10-CM | POA: Diagnosis not present

## 2023-04-27 DIAGNOSIS — E785 Hyperlipidemia, unspecified: Secondary | ICD-10-CM | POA: Diagnosis not present

## 2023-04-27 DIAGNOSIS — D631 Anemia in chronic kidney disease: Secondary | ICD-10-CM | POA: Diagnosis present

## 2023-04-27 DIAGNOSIS — K900 Celiac disease: Secondary | ICD-10-CM | POA: Insufficient documentation

## 2023-04-27 DIAGNOSIS — I129 Hypertensive chronic kidney disease with stage 1 through stage 4 chronic kidney disease, or unspecified chronic kidney disease: Secondary | ICD-10-CM | POA: Insufficient documentation

## 2023-04-27 DIAGNOSIS — E1122 Type 2 diabetes mellitus with diabetic chronic kidney disease: Secondary | ICD-10-CM | POA: Diagnosis not present

## 2023-04-27 DIAGNOSIS — F17290 Nicotine dependence, other tobacco product, uncomplicated: Secondary | ICD-10-CM | POA: Insufficient documentation

## 2023-04-27 DIAGNOSIS — E1140 Type 2 diabetes mellitus with diabetic neuropathy, unspecified: Secondary | ICD-10-CM | POA: Diagnosis not present

## 2023-04-27 DIAGNOSIS — K219 Gastro-esophageal reflux disease without esophagitis: Secondary | ICD-10-CM | POA: Diagnosis not present

## 2023-04-27 DIAGNOSIS — N183 Chronic kidney disease, stage 3 unspecified: Secondary | ICD-10-CM | POA: Insufficient documentation

## 2023-04-27 HISTORY — PX: IR BONE MARROW BIOPSY & ASPIRATION: IMG5727

## 2023-04-27 LAB — CBC WITH DIFFERENTIAL/PLATELET
Abs Immature Granulocytes: 0.02 10*3/uL (ref 0.00–0.07)
Basophils Absolute: 0 10*3/uL (ref 0.0–0.1)
Basophils Relative: 1 %
Eosinophils Absolute: 0.3 10*3/uL (ref 0.0–0.5)
Eosinophils Relative: 6 %
HCT: 33.4 % — ABNORMAL LOW (ref 39.0–52.0)
Hemoglobin: 10.2 g/dL — ABNORMAL LOW (ref 13.0–17.0)
Immature Granulocytes: 0 %
Lymphocytes Relative: 14 %
Lymphs Abs: 0.6 10*3/uL — ABNORMAL LOW (ref 0.7–4.0)
MCH: 27.4 pg (ref 26.0–34.0)
MCHC: 30.5 g/dL (ref 30.0–36.0)
MCV: 89.8 fL (ref 80.0–100.0)
Monocytes Absolute: 0.5 10*3/uL (ref 0.1–1.0)
Monocytes Relative: 11 %
Neutro Abs: 3 10*3/uL (ref 1.7–7.7)
Neutrophils Relative %: 68 %
Platelets: 169 10*3/uL (ref 150–400)
RBC: 3.72 MIL/uL — ABNORMAL LOW (ref 4.22–5.81)
RDW: 14.4 % (ref 11.5–15.5)
WBC: 4.5 10*3/uL (ref 4.0–10.5)
nRBC: 0 % (ref 0.0–0.2)

## 2023-04-27 LAB — GLUCOSE, CAPILLARY
Glucose-Capillary: 96 mg/dL (ref 70–99)
Glucose-Capillary: 85 mg/dL (ref 70–99)

## 2023-04-27 MED ORDER — FENTANYL CITRATE (PF) 100 MCG/2ML IJ SOLN
INTRAMUSCULAR | Status: AC | PRN
  Administered 2023-04-27 (×2): 50 ug via INTRAVENOUS

## 2023-04-27 MED ORDER — LIDOCAINE HCL (PF) 1 % IJ SOLN: 10.0000 mL | Freq: Once | INTRAMUSCULAR | Status: DC

## 2023-04-27 MED ORDER — FENTANYL CITRATE (PF) 100 MCG/2ML IJ SOLN
INTRAMUSCULAR | Status: AC
  Filled 2023-04-27: qty 2

## 2023-04-27 MED ORDER — SODIUM CHLORIDE 0.9 % IV SOLN: INTRAVENOUS | Status: DC

## 2023-04-27 MED ORDER — HEPARIN SOD (PORK) LOCK FLUSH 100 UNIT/ML IV SOLN
INTRAVENOUS | Status: AC
  Filled 2023-04-27: qty 5

## 2023-04-27 MED ORDER — MIDAZOLAM HCL 2 MG/2ML IJ SOLN
INTRAMUSCULAR | Status: AC
  Filled 2023-04-27: qty 4

## 2023-04-27 MED ORDER — MIDAZOLAM HCL 2 MG/2ML IJ SOLN
INTRAMUSCULAR | Status: AC | PRN
  Administered 2023-04-27 (×2): 1 mg via INTRAVENOUS

## 2023-04-27 NOTE — Procedures (Signed)
  Procedure:  IR bone marrow biopsy L iliac Preprocedure diagnosis: The encounter diagnosis was Symptomatic anemia. Postprocedure diagnosis: same EBL:    minimal Complications:   none immediate  See full dictation in [employer].  Thora Lance MD Main # [phone] Pager  [phone] Mobile [phone]

## 2023-04-29 LAB — SURGICAL PATHOLOGY

## 2023-05-02 ENCOUNTER — Inpatient Hospital Stay: Payer: BC Managed Care – PPO

## 2023-05-02 ENCOUNTER — Ambulatory Visit: Payer: BC Managed Care – PPO

## 2023-05-02 ENCOUNTER — Other Ambulatory Visit: Payer: BC Managed Care – PPO

## 2023-05-02 ENCOUNTER — Encounter: Payer: Self-pay | Admitting: Internal Medicine

## 2023-05-02 ENCOUNTER — Inpatient Hospital Stay (HOSPITAL_BASED_OUTPATIENT_CLINIC_OR_DEPARTMENT_OTHER): Payer: BC Managed Care – PPO | Admitting: Internal Medicine

## 2023-05-02 ENCOUNTER — Ambulatory Visit: Payer: BC Managed Care – PPO | Admitting: Internal Medicine

## 2023-05-02 ENCOUNTER — Inpatient Hospital Stay: Payer: BC Managed Care – PPO | Attending: Internal Medicine

## 2023-05-02 VITALS — BP 135/72 | HR 63 | Temp 97.9°F | Resp 16

## 2023-05-02 DIAGNOSIS — D649 Anemia, unspecified: Secondary | ICD-10-CM | POA: Diagnosis present

## 2023-05-02 LAB — CBC WITH DIFFERENTIAL (CANCER CENTER ONLY)
Abs Immature Granulocytes: 0.02 10*3/uL (ref 0.00–0.07)
Basophils Absolute: 0 10*3/uL (ref 0.0–0.1)
Basophils Relative: 1 %
Eosinophils Absolute: 0.3 10*3/uL (ref 0.0–0.5)
Eosinophils Relative: 6 %
HCT: 32.2 % — ABNORMAL LOW (ref 39.0–52.0)
Hemoglobin: 10.3 g/dL — ABNORMAL LOW (ref 13.0–17.0)
Immature Granulocytes: 0 %
Lymphocytes Relative: 15 %
Lymphs Abs: 0.8 10*3/uL (ref 0.7–4.0)
MCH: 28 pg (ref 26.0–34.0)
MCHC: 32 g/dL (ref 30.0–36.0)
MCV: 87.5 fL (ref 80.0–100.0)
Monocytes Absolute: 0.6 10*3/uL (ref 0.1–1.0)
Monocytes Relative: 11 %
Neutro Abs: 3.5 10*3/uL (ref 1.7–7.7)
Neutrophils Relative %: 67 %
Platelet Count: 197 10*3/uL (ref 150–400)
RBC: 3.68 MIL/uL — ABNORMAL LOW (ref 4.22–5.81)
RDW: 14 % (ref 11.5–15.5)
WBC Count: 5.2 10*3/uL (ref 4.0–10.5)
nRBC: 0 % (ref 0.0–0.2)

## 2023-05-02 LAB — BASIC METABOLIC PANEL
Anion gap: 8 (ref 5–15)
BUN: 43 mg/dL — ABNORMAL HIGH (ref 8–23)
CO2: 19 mmol/L — ABNORMAL LOW (ref 22–32)
Calcium: 8.3 mg/dL — ABNORMAL LOW (ref 8.9–10.3)
Chloride: 111 mmol/L (ref 98–111)
Creatinine, Ser: 2.13 mg/dL — ABNORMAL HIGH (ref 0.61–1.24)
GFR, Estimated: 35 mL/min — ABNORMAL LOW (ref >60)
Glucose, Bld: 127 mg/dL — ABNORMAL HIGH (ref 70–99)
Potassium: 3.6 mmol/L (ref 3.5–5.1)
Sodium: 138 mmol/L (ref 135–145)

## 2023-05-02 LAB — FERRITIN: Ferritin: 217 ng/mL (ref 24–336)

## 2023-05-02 LAB — IRON AND TIBC
Iron: 57 ug/dL (ref 45–182)
Saturation Ratios: 21 % (ref 17.9–39.5)
TIBC: 269 ug/dL (ref 250–450)
UIBC: 212 ug/dL

## 2023-05-02 MED ORDER — SODIUM CHLORIDE 0.9 % IV SOLN
Freq: Once | INTRAVENOUS | Status: AC
  Filled 2023-05-02: qty 250

## 2023-05-02 MED ORDER — SODIUM CHLORIDE 0.9 % IV SOLN
200.0000 mg | Freq: Once | INTRAVENOUS | Status: AC
  Administered 2023-05-02: 200 mg via INTRAVENOUS
  Filled 2023-05-02: qty 200

## 2023-05-02 NOTE — Progress Notes (Signed)
Bone marrow results.

## 2023-05-02 NOTE — Assessment & Plan Note (Addendum)
#  Anemia-symptomatic fatigue hemoglobin 9-10--likely multifactorial-iron malabsorption/celiac disease/CKD-stage MAY 2024- Bone marrow biopsy/aspiration- The bone marrow is generally normocellular for age with trilineage  hematopoiesis and nonspecific changes, likely secondary in nature in  this setting.  There is no morphologic evidence of a lymphoproliferative  process or plasma cell neoplasm.   status post Venofer; Retacrit as needed to keep hemoglobin greater than 10  # Hemoglobin today is 10.2; status post retacrit approximately 3 weeks ago.  MARCH 2024-iron saturation- 19; ferritin 300- HOLD retacrit;  proceed with venofer today  # Ckd stage III [GFR- 31; nephro, [place]]-diabetes- GFR-33 stable.    # Diabetes [[facility] Hb A1c- 7.4]-PBG 127  [steroid injectio]-continue follow-up with endocrinology/monitoring of blood sugars. Stable.  # Cold intolerance [TSH 2021- 5]- recommend follow up with PCP/ awaiting appt in 1 weeks  # DISPOSITION: # ok with Venofer today; NO retacrit # follow up in 1 month; APP ; labs- cbc/bmp; ;possible venofer or retacrit  # follow up in 2 month; MD;  labs- cbc/bmp; ;possible venofer or retacrit -Dr.B

## 2023-05-02 NOTE — Progress Notes (Signed)
[facility] CONSULT NOTE  Patient Care Team: Patient, [name] as PCP - General (General Practice) Otelia Sergeant, NP (Inactive) as Nurse Practitioner (Endocrinology) Earna Coder, MD as Consulting Physician (Oncology)  CHIEF COMPLAINTS/PURPOSE OF CONSULTATION: ANEMIA   HEMATOLOGY HISTORY:  # ANEMIA PROGRESSIVE since 2020 12; 2022- 10 MCV- 90s; colonoscopy 2015; multiple endoscopies-last September 2021-biopsy active celiac disease; MAY 2024- Bone marrow biopsy/aspiration- The bone marrow is generally normocellular for age with trilineage  hematopoiesis and nonspecific changes, likely secondary in nature in  this setting.  There is no morphologic evidence of a lymphoproliferative  process or plasma cell neoplasm.   #CKD stage III [[facility]]/diabetes [[facility]]; ? Celaic disease [[facility]];    - Labs: 07/2014 - tTG IgA 101 03/2020 - positive endomysial Ab IgA, tTG IgA 78 - CSY: 07/2014 - normal appearing terminal ileum, sigmoid diverticulosis, and non-bleeding internal hemorrhoids - EGD: 07/2014 - mild non-erosive gastritis, normal appearing duodenal mucosa with duodenal biopsies showing celiac sprue - EGD: 06/03/2020 - procedure aborted due to presence of food - EGD: 08/12/2020 - irregular Z-line found at [facility] with bx showing mild reflux esophagitis, 1 cm hiatal hernia, mild chronic gastritis, decreased folds found in entire duodenum, flattening found in entire duodenum and scalloped mucosa with bx showing active Celiac disease with villous atrophy, increased intraepithelial lymphocytes, etc. Repeat in 1-year.  HISTORY OF PRESENTING ILLNESS: Alone.  Ambulating independently.  Brian Nielsen 62 y.o.  male symptomatic anemia-likely secondary to chronic renal disease and other-multiple comorbidities poorly controlled diabetes-complications of CKD; peripheral neuropathy; anemia-Venofer/Retacrit is here for follow-up-reviewed blood and bone marrow  biopsy.  Patient complains of pain postbiopsy.  Otherwise denies any bleeding.   Complains of cold intolerance.  Chronic fatigue. Noted to have swelling of the feet.  Currently on Lasix.   Patient continues to complain of pain in right foot.   Otherwise no nausea no vomiting.   Review of Systems  Constitutional:  Positive for malaise/fatigue and weight loss. Negative for chills, diaphoresis and fever.  HENT:  Negative for nosebleeds and sore throat.   Eyes:  Negative for double vision.  Respiratory:  Positive for shortness of breath. Negative for cough, hemoptysis, sputum production and wheezing.   Cardiovascular:  Negative for chest pain, palpitations, orthopnea and leg swelling.  Gastrointestinal:  Negative for blood in stool, heartburn, melena, nausea and vomiting.  Genitourinary:  Negative for dysuria, frequency and urgency.  Musculoskeletal:  Positive for back pain and joint pain.  Skin: Negative.  Negative for itching and rash.  Neurological:  Positive for weakness. Negative for dizziness, tingling, focal weakness and headaches.  Endo/Heme/Allergies:  Does not bruise/bleed easily.  Psychiatric/Behavioral:  Negative for depression. The patient is not nervous/anxious and does not have insomnia.     MEDICAL HISTORY:  Past Medical History:  Diagnosis Date   Adult celiac disease 2015   followed by dr Servando Snare (GI)   Anemia associated with chronic renal failure    hematologist--- dr Sarajane Jews;  treated with iron infusions   Benign localized prostatic hyperplasia with lower urinary tract symptoms (LUTS)    urologist--- dr Retta Diones   Charcot foot due to diabetes mellitus (HCC)    Chronic constipation    CKD (chronic kidney disease), stage III [facility])    nephrologist--- dr Wolfgang Phoenix;  renal bx 11-20-2021   Diverticulosis of colon    Edema of both lower extremities    GERD (gastroesophageal reflux disease)    History of adenomatous polyp of colon  HTN (hypertension)     Hyperlipidemia, mixed    Peripheral neuropathy    Peyronie's disease    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 meatal stricture    Retinopathy due to secondary diabetes mellitus (HCC)    both eyes  treated with injecitons   Thoracic spondylosis without myelopathy 11/22/2016   Type 2 diabetes mellitus [facility])    endocrinologist--- dr nida   Vitamin D deficiency    Wears hearing aid in both ears     SURGICAL HISTORY: Past Surgical History:  Procedure Laterality Date   BIOPSY N/A 09/07/2021   Procedure: BIOPSY;  Surgeon: Midge Minium, MD;  Location: [facility];  Service: Endoscopy;  Laterality: N/A;   CATARACT EXTRACTION W/ INTRAOCULAR LENS IMPLANT Bilateral 2023   COLONOSCOPY N/A 05/03/2014   Procedure: COLONOSCOPY;  Surgeon: West Bali, MD;  [facility];  Service: Endoscopy;  Laterality: N/A;  12:00   COLONOSCOPY WITH PROPOFOL N/A 09/07/2021   Procedure: COLONOSCOPY WITH PROPOFOL;  Surgeon: Midge Minium, MD;  Location: [facility];  Service: Endoscopy;  Laterality: N/A;   CYSTOSCOPY WITH URETHRAL DILATATION N/A 01/10/2023   Procedure: CYSTOSCOPY;  Surgeon: Marcine Matar, MD;  Location: [facility];  Service: Urology;  Laterality: N/A;   ESOPHAGOGASTRODUODENOSCOPY N/A 05/03/2014   Procedure: ESOPHAGOGASTRODUODENOSCOPY (EGD);  Surgeon: West Bali, MD;  [facility];  Service: Endoscopy;  Laterality: N/A;   ESOPHAGOGASTRODUODENOSCOPY (EGD) WITH PROPOFOL N/A 06/03/2020   Procedure: ESOPHAGOGASTRODUODENOSCOPY (EGD) WITH PROPOFOL;  Surgeon: Regis Bill, MD;  Location: [facility];  Service: Endoscopy;  Laterality: N/A;   ESOPHAGOGASTRODUODENOSCOPY (EGD) WITH PROPOFOL N/A 09/07/2021   Procedure: ESOPHAGOGASTRODUODENOSCOPY (EGD) WITH PROPOFOL;  Surgeon: Midge Minium, MD;  Location: [facility];  Service: Endoscopy;  Laterality: N/A;  Diabetic   IR BONE MARROW BIOPSY & ASPIRATION  04/27/2023   MEATOTOMY N/A 01/10/2023    Procedure: MEATOTOMY ADULT;  Surgeon: Marcine Matar, MD;  Location: [facility];  Service: Urology;  Laterality: N/A;   PARS PLANA VITRECTOMY Right 03/21/2021   Procedure: PARS PLANA VITRECTOMY 25 GAUGE WITH INJECTION OF ANTIBIOTICS FOR ENDOPHTHALMITIS;  Surgeon: Carmela Rima, MD;  Location: [facility] OR;  Service: Ophthalmology;  Laterality: Right;   POLYPECTOMY N/A 09/07/2021   Procedure: POLYPECTOMY;  Surgeon: Midge Minium, MD;  Location: [facility];  Service: Endoscopy;  Laterality: N/A;   REPAIR EXTENSOR TENDON Right 08/11/2022   Procedure: EXTENSOR CARPI ULNARIS TENDON DEBRIDEMENT, POSSIBLE SUBSHEATH RECONSTRUCTION;  Surgeon: Marlyne Beards, MD;  Location: [facility];  Service: Orthopedics;  Laterality: Right;  or regional plus MAC    SOCIAL HISTORY: Social History   Socioeconomic History   Marital status: Married    Spouse name: [name]   Number of children: 1   Years of education: 12   Highest education level: Not on file  Occupational History   Occupation: Architect: [employer]    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  Tobacco Use   Smoking status: Never   Smokeless tobacco: Current    Types: Snuff  Vaping Use   Vaping Use: Never used  Substance and Sexual Activity   Alcohol use: Not Currently    Comment: seldom   Drug use: Never   Sexual activity: Yes    Birth control/protection: Surgical  Other Topics Concern   Not on file  Social History Narrative   Lives with wife   [place]   Caffeine- diet Mtn Dew, 2 glasses  Social Determinants of Health   Financial Resource Strain: Not on file  Food Insecurity: Not on file  Transportation Needs: Not on file  Physical Activity: Not on file  Stress: Not on file  Social Connections: Not on file  Intimate Partner Violence: Not on file    FAMILY HISTORY: Family History  Problem Relation Age of Onset   Aneurysm Mother        brain    Hypertension Mother    Cancer Father    Heart disease Father    Diabetes Father    Hypertension Father    Hyperlipidemia Father    Cancer Maternal Grandmother        melanoma   Heart disease Paternal Grandfather    Colon cancer Neg Hx     ALLERGIES:  is allergic to duloxetine, gluten meal, and codeine.  MEDICATIONS:  Current Outpatient Medications  Medication Sig Dispense Refill   aspirin 81 MG EC tablet Take 81 mg by mouth daily.     atorvastatin (LIPITOR) 40 MG tablet Take 1 tablet (40 mg total) by mouth daily. (Patient taking differently: Take 40 mg by mouth daily.) 90 tablet 3   carvedilol (COREG) 3.125 MG tablet Take 3.125 mg by mouth 2 (two) times daily with a meal. Takes 2 bid.     furosemide (LASIX) 20 MG tablet Take 20 mg by mouth 2 (two) times daily.     gabapentin (NEURONTIN) 300 MG capsule Take 900 mg by mouth 3 (three) times daily. Takes 3 caps twice daily and third time takes 4 caps     insulin glargine (LANTUS SOLOSTAR) 100 UNIT/ML Solostar Pen Inject 60 Units into the skin at bedtime. (Patient taking differently: Inject 36 Units into the skin 2 (two) times daily.) 60 mL 0   JARDIANCE 10 MG TABS tablet Take 25 mg by mouth every morning.     lisinopril (ZESTRIL) 20 MG tablet Take 20 mg by mouth daily.     pantoprazole (PROTONIX) 40 MG tablet Take 1 tablet by mouth daily.     tirzepatide (MOUNJARO) 10 MG/0.5ML Pen Inject 10 mg into the skin once a week. Friday's     cloNIDine (CATAPRES) 0.2 MG tablet Take 0.2 mg by mouth 2 (two) times daily.     tamsulosin (FLOMAX) 0.4 MG CAPS capsule Take 0.4 mg by mouth at bedtime.     No current facility-administered medications for this visit.   Facility-Administered Medications Ordered in Other Visits  Medication Dose Route Frequency Provider Last Rate Last Admin   iron sucrose (VENOFER) 200 mg in sodium chloride 0.9 % 100 mL IVPB  200 mg Intravenous Once Alinda Dooms, NP 440 mL/hr at 05/02/23 1609 200 mg at 05/02/23 1609       PHYSICAL EXAMINATION:   Vitals:   05/02/23 1445  BP: 125/76  Pulse: 64  Temp: 98.3 F (36.8 C)  SpO2: 98%   Filed Weights   05/02/23 1445  Weight: 206 lb 3.2 oz (93.5 kg)    Physical Exam Vitals and nursing note reviewed.  Constitutional:      Comments:    HENT:     Head: Normocephalic and atraumatic.     Mouth/Throat:     Pharynx: Oropharynx is clear.  Eyes:     Extraocular Movements: Extraocular movements intact.     Pupils: Pupils are equal, round, and reactive to light.  Cardiovascular:     Rate and Rhythm: Normal rate and regular rhythm.  Pulmonary:     Comments: Decreased  breath sounds bilaterally.  Abdominal:     Palpations: Abdomen is soft.  Musculoskeletal:        General: Normal range of motion.     Cervical back: Normal range of motion.  Skin:    General: Skin is warm.  Neurological:     General: No focal deficit present.     Mental Status: He is alert and oriented to person, place, and time.  Psychiatric:        Behavior: Behavior normal.        Judgment: Judgment normal.     LABORATORY DATA:  I have reviewed the data as listed Lab Results  Component Value Date   WBC 5.2 05/02/2023   HGB 10.3 (L) 05/02/2023   HCT 32.2 (L) 05/02/2023   MCV 87.5 05/02/2023   PLT 197 05/02/2023   Recent Labs    02/02/23 1441 02/03/23 1849 02/10/23 1510 04/01/23 1433 05/02/23 1448  NA 134*   < > 138 139 138  K 3.6   < > 3.7 3.9 3.6  CL 105   < > 107 110 111  CO2 23   < > 25 21* 19*  GLUCOSE 295*   < > 238* 227* 127*  BUN 31*   < > 42* 32* 43*  CREATININE 1.87*   < > 2.24* 2.07* 2.13*  CALCIUM 8.4*   < > 8.3* 8.3* 8.3*  GFRNONAA 40*   < > 33* 36* 35*  PROT 6.0*  --   --  6.1*  --   ALBUMIN 3.1*  --   --  3.1*  --   AST 22  --   --  23  --   ALT 16  --   --  20  --   ALKPHOS 62  --   --  83  --   BILITOT 0.4  --   --  0.8  --    < > = values in this interval not displayed.     IR BONE MARROW BIOPSY & ASPIRATION  Result Date:  04/27/2023 CLINICAL DATA:  Anemia EXAM: FLUOROSCOPIC GUIDED DEEP ILIAC BONE ASPIRATION AND CORE BIOPSY TECHNIQUE: Patient was placed prone on the fluoro table and limited images through the pelvis were obtained. Appropriate skin entry site was identified. Skin site was marked, prepped with chlorhexidine, draped in usual sterile fashion, and infiltrated locally with 1% lidocaine. Intravenous Fentanyl and Versed 2mg  were administered as conscious sedation during continuous monitoring of the patient's level of consciousness and physiological / cardiorespiratory status by the radiology RN, with a total moderate sedation time of 8 minutes. Under fluoroscopic guidance an 11-gauge Cook trocar bone needle was advanced into the left iliac bone just lateral to the sacroiliac joint. Once needle tip position was confirmed, core and aspiration samples were obtained, submitted to pathology for approval. Patient tolerated procedure well. COMPLICATIONS: COMPLICATIONS none IMPRESSION: 1. Technically successful fluoro guided left iliac bone core and aspiration biopsy. Electronically Signed   By: Corlis Leak M.D.   On: 04/27/2023 14:31    Symptomatic anemia #Anemia-symptomatic fatigue hemoglobin 9-10--likely multifactorial-iron malabsorption/celiac disease/CKD-stage MAY 2024- Bone marrow biopsy/aspiration- The bone marrow is generally normocellular for age with trilineage  hematopoiesis and nonspecific changes, likely secondary in nature in  this setting.  There is no morphologic evidence of a lymphoproliferative  process or plasma cell neoplasm.   status post Venofer; Retacrit as needed to keep hemoglobin greater than 10  # Hemoglobin today is 10.2; status post retacrit approximately 3 weeks  ago.  MARCH 2024-iron saturation- 19; ferritin 300- HOLD retacrit;  proceed with venofer today  # Ckd stage III [GFR- 31; nephro, [place]]-diabetes- GFR-33 stable.    # Diabetes [[facility] Hb A1c- 7.4]-PBG 127  [steroid  injectio]-continue follow-up with endocrinology/monitoring of blood sugars. Stable.  # Cold intolerance [TSH 2021- 5]- recommend follow up with PCP/ awaiting appt in 1 weeks  # DISPOSITION: # ok with Venofer today; NO retacrit # follow up in 1 month; APP ; labs- cbc/bmp; ;possible venofer or retacrit  # follow up in 2 month; MD;  labs- cbc/bmp; ;possible venofer or retacrit -Dr.B  All questions were answered. The patient knows to call the clinic with any problems, questions or concerns.    Earna Coder, MD 05/02/2023 4:14 PM

## 2023-05-04 ENCOUNTER — Other Ambulatory Visit (HOSPITAL_COMMUNITY): Payer: Self-pay | Admitting: Urology

## 2023-05-04 DIAGNOSIS — R339 Retention of urine, unspecified: Secondary | ICD-10-CM

## 2023-05-05 ENCOUNTER — Encounter (HOSPITAL_BASED_OUTPATIENT_CLINIC_OR_DEPARTMENT_OTHER): Payer: BC Managed Care – PPO | Admitting: Pulmonary Disease

## 2023-05-06 ENCOUNTER — Ambulatory Visit (HOSPITAL_BASED_OUTPATIENT_CLINIC_OR_DEPARTMENT_OTHER): Payer: BC Managed Care – PPO | Attending: Pulmonary Disease | Admitting: Pulmonary Disease

## 2023-05-06 DIAGNOSIS — G4733 Obstructive sleep apnea (adult) (pediatric): Secondary | ICD-10-CM | POA: Insufficient documentation

## 2023-05-06 DIAGNOSIS — E1165 Type 2 diabetes mellitus with hyperglycemia: Secondary | ICD-10-CM | POA: Insufficient documentation

## 2023-05-06 DIAGNOSIS — H353130 Nonexudative age-related macular degeneration, bilateral, stage unspecified: Secondary | ICD-10-CM | POA: Insufficient documentation

## 2023-05-10 ENCOUNTER — Other Ambulatory Visit: Payer: Self-pay | Admitting: Radiology

## 2023-05-10 DIAGNOSIS — H3530 Unspecified macular degeneration: Secondary | ICD-10-CM | POA: Insufficient documentation

## 2023-05-10 DIAGNOSIS — M47819 Spondylosis without myelopathy or radiculopathy, site unspecified: Secondary | ICD-10-CM | POA: Insufficient documentation

## 2023-05-10 DIAGNOSIS — E11319 Type 2 diabetes mellitus with unspecified diabetic retinopathy without macular edema: Secondary | ICD-10-CM | POA: Insufficient documentation

## 2023-05-10 DIAGNOSIS — G8929 Other chronic pain: Secondary | ICD-10-CM | POA: Insufficient documentation

## 2023-05-10 DIAGNOSIS — H02409 Unspecified ptosis of unspecified eyelid: Secondary | ICD-10-CM | POA: Insufficient documentation

## 2023-05-11 ENCOUNTER — Encounter (HOSPITAL_COMMUNITY): Payer: Self-pay

## 2023-05-11 ENCOUNTER — Other Ambulatory Visit: Payer: Self-pay

## 2023-05-11 ENCOUNTER — Ambulatory Visit (HOSPITAL_COMMUNITY)
Admission: RE | Admit: 2023-05-11 | Discharge: 2023-05-11 | Disposition: A | Discharge status: Home / Self Care | Payer: BC Managed Care – PPO | Source: Ambulatory Visit | Attending: Urology | Admitting: Urology

## 2023-05-11 DIAGNOSIS — N139 Obstructive and reflux uropathy, unspecified: Secondary | ICD-10-CM | POA: Diagnosis not present

## 2023-05-11 DIAGNOSIS — R339 Retention of urine, unspecified: Secondary | ICD-10-CM | POA: Insufficient documentation

## 2023-05-11 DIAGNOSIS — Z7985 Long-term (current) use of injectable non-insulin antidiabetic drugs: Secondary | ICD-10-CM | POA: Diagnosis not present

## 2023-05-11 DIAGNOSIS — Z794 Long term (current) use of insulin: Secondary | ICD-10-CM | POA: Diagnosis not present

## 2023-05-11 DIAGNOSIS — N35911 Unspecified urethral stricture, male, meatal: Secondary | ICD-10-CM | POA: Insufficient documentation

## 2023-05-11 DIAGNOSIS — F17290 Nicotine dependence, other tobacco product, uncomplicated: Secondary | ICD-10-CM | POA: Insufficient documentation

## 2023-05-11 DIAGNOSIS — I129 Hypertensive chronic kidney disease with stage 1 through stage 4 chronic kidney disease, or unspecified chronic kidney disease: Secondary | ICD-10-CM | POA: Insufficient documentation

## 2023-05-11 DIAGNOSIS — N400 Enlarged prostate without lower urinary tract symptoms: Secondary | ICD-10-CM | POA: Insufficient documentation

## 2023-05-11 DIAGNOSIS — N183 Chronic kidney disease, stage 3 unspecified: Secondary | ICD-10-CM | POA: Diagnosis not present

## 2023-05-11 DIAGNOSIS — E1122 Type 2 diabetes mellitus with diabetic chronic kidney disease: Secondary | ICD-10-CM | POA: Diagnosis not present

## 2023-05-11 LAB — GLUCOSE, CAPILLARY
Glucose-Capillary: 129 mg/dL — ABNORMAL HIGH (ref 70–99)
Glucose-Capillary: 68 mg/dL — ABNORMAL LOW (ref 70–99)
Glucose-Capillary: 106 mg/dL — ABNORMAL HIGH (ref 70–99)
Glucose-Capillary: 68 mg/dL — ABNORMAL LOW (ref 70–99)

## 2023-05-11 MED ORDER — MIDAZOLAM HCL 2 MG/2ML IJ SOLN
INTRAMUSCULAR | Status: AC
  Filled 2023-05-11: qty 2

## 2023-05-11 MED ORDER — FENTANYL CITRATE (PF) 100 MCG/2ML IJ SOLN
INTRAMUSCULAR | Status: AC | PRN
  Administered 2023-05-11 (×4): 50 ug via INTRAVENOUS

## 2023-05-11 MED ORDER — SODIUM CHLORIDE 0.9 % IV SOLN: INTRAVENOUS | Status: DC

## 2023-05-11 MED ORDER — FENTANYL CITRATE (PF) 100 MCG/2ML IJ SOLN
INTRAMUSCULAR | Status: AC
  Filled 2023-05-11: qty 2

## 2023-05-11 MED ORDER — MIDAZOLAM HCL 2 MG/2ML IJ SOLN
INTRAMUSCULAR | Status: AC | PRN
  Administered 2023-05-11 (×3): 1 mg via INTRAVENOUS

## 2023-05-11 MED ORDER — MIDAZOLAM HCL 5 MG/5ML IJ SOLN
INTRAMUSCULAR | Status: AC | PRN
  Administered 2023-05-11: 1 mg via INTRAVENOUS

## 2023-05-11 MED ORDER — LIDOCAINE HCL 1 % IJ SOLN
10.0000 mL | Freq: Once | INTRAMUSCULAR | Status: AC
  Administered 2023-05-11: 10 mL via INTRADERMAL

## 2023-05-11 MED ORDER — SODIUM CHLORIDE 0.9% FLUSH: 5.0000 mL | Freq: Three times a day (TID) | INTRAVENOUS | Status: DC

## 2023-05-11 MED ORDER — HYDROCODONE-ACETAMINOPHEN 5-325 MG PO TABS: 1.0000 | ORAL_TABLET | ORAL | Status: DC | PRN

## 2023-05-11 NOTE — H&P (Signed)
Chief Complaint: Patient was seen in consultation today for supra pubic catheter placement at the request of Pace,Maryellen D  Referring Physician(s): Pace,Maryellen D  Supervising Physician: Oley Balm  Patient Status: [facility] - Out-pt  History of Present Illness: Brian Nielsen is a 62 y.o. male   FULL Code status per pt Hx DM; BPH CKD Obstructive uropathy; meatal stenosis; urethral stricture Has followed with Urology for years Continued urinary retention Scheduled for supra pubic catheter placement per Dr Arita Miss  Past Medical History:  Diagnosis Date   Adult celiac disease 2015   followed by dr Servando Snare (GI)   Anemia associated with chronic renal failure    hematologist--- dr Sarajane Jews;  treated with iron infusions   Benign localized prostatic hyperplasia with lower urinary tract symptoms (LUTS)    urologist--- dr Retta Diones   Charcot foot due to diabetes mellitus (HCC)    Chronic constipation    CKD (chronic kidney disease), stage III [facility])    nephrologist--- dr Wolfgang Phoenix;  renal bx 11-20-2021   Diverticulosis of colon    Edema of both lower extremities    GERD (gastroesophageal reflux disease)    History of adenomatous polyp of colon    HTN (hypertension)    Hyperlipidemia, mixed    Peripheral neuropathy    Peyronie's disease    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 meatal stricture    Retinopathy due to secondary diabetes mellitus (HCC)    both eyes  treated with injecitons   Thoracic spondylosis without myelopathy 11/22/2016   Type 2 diabetes mellitus [facility])    endocrinologist--- dr nida   Vitamin D deficiency    Wears hearing aid in both ears     Past Surgical History:  Procedure Laterality Date   BIOPSY N/A 09/07/2021   Procedure: BIOPSY;  Surgeon: Midge Minium, MD;  Location: [facility];  Service: Endoscopy;  Laterality: N/A;   CATARACT EXTRACTION W/ INTRAOCULAR LENS IMPLANT Bilateral 2023   COLONOSCOPY N/A 05/03/2014   Procedure: COLONOSCOPY;  Surgeon:  West Bali, MD;  [facility];  Service: Endoscopy;  Laterality: N/A;  12:00   COLONOSCOPY WITH PROPOFOL N/A 09/07/2021   Procedure: COLONOSCOPY WITH PROPOFOL;  Surgeon: Midge Minium, MD;  Location: [facility];  Service: Endoscopy;  Laterality: N/A;   CYSTOSCOPY WITH URETHRAL DILATATION N/A 01/10/2023   Procedure: CYSTOSCOPY;  Surgeon: Marcine Matar, MD;  Location: [facility];  Service: Urology;  Laterality: N/A;   ESOPHAGOGASTRODUODENOSCOPY N/A 05/03/2014   Procedure: ESOPHAGOGASTRODUODENOSCOPY (EGD);  Surgeon: West Bali, MD;  [facility]: [facility];  Service: Endoscopy;  Laterality: N/A;   ESOPHAGOGASTRODUODENOSCOPY (EGD) WITH PROPOFOL N/A 06/03/2020   Procedure: ESOPHAGOGASTRODUODENOSCOPY (EGD) WITH PROPOFOL;  Surgeon: Regis Bill, MD;  Location: [facility];  Service: Endoscopy;  Laterality: N/A;   ESOPHAGOGASTRODUODENOSCOPY (EGD) WITH PROPOFOL N/A 09/07/2021   Procedure: ESOPHAGOGASTRODUODENOSCOPY (EGD) WITH PROPOFOL;  Surgeon: Midge Minium, MD;  Location: [facility];  Service: Endoscopy;  Laterality: N/A;  Diabetic   IR BONE MARROW BIOPSY & ASPIRATION  04/27/2023   MEATOTOMY N/A 01/10/2023   Procedure: MEATOTOMY ADULT;  Surgeon: Marcine Matar, MD;  Location: [facility];  Service: Urology;  Laterality: N/A;   PARS PLANA VITRECTOMY Right 03/21/2021   Procedure: PARS PLANA VITRECTOMY 25 GAUGE WITH INJECTION OF ANTIBIOTICS FOR ENDOPHTHALMITIS;  Surgeon: Carmela Rima, MD;  Location: [facility];  Service: Ophthalmology;  Laterality: Right;   POLYPECTOMY N/A 09/07/2021   Procedure: POLYPECTOMY;  Surgeon: Midge Minium, MD;  Location: [facility];  Service: Endoscopy;  Laterality: N/A;   REPAIR EXTENSOR TENDON Right 08/11/2022   Procedure: EXTENSOR CARPI ULNARIS TENDON DEBRIDEMENT, POSSIBLE SUBSHEATH RECONSTRUCTION;  Surgeon: Marlyne Beards, MD;  Location: [facility];  Service:  Orthopedics;  Laterality: Right;  or regional plus MAC    Allergies: Duloxetine, Gluten meal, and Codeine  Medications: Prior to Admission medications   Medication Sig Start Date End Date Taking? Authorizing Provider  aspirin 81 MG EC tablet Take 81 mg by mouth daily.   Yes [provider]  atorvastatin (LIPITOR) 40 MG tablet Take 1 tablet (40 mg total) by mouth daily. Patient taking differently: Take 40 mg by mouth daily. 10/21/16  Yes Eustace Moore, MD  carvedilol (COREG) 3.125 MG tablet Take 3.125 mg by mouth 2 (two) times daily with a meal. Takes 2 bid. 11/11/22  Yes [provider]  furosemide (LASIX) 20 MG tablet Take 20 mg by mouth 2 (two) times daily. 01/28/23  Yes [provider]  gabapentin (NEURONTIN) 300 MG capsule Take 900 mg by mouth 3 (three) times daily. Takes 3 caps twice daily and third time takes 4 caps 02/16/22  Yes [provider]  insulin glargine (LANTUS SOLOSTAR) 100 UNIT/ML Solostar Pen Inject 60 Units into the skin at bedtime. Patient taking differently: Inject 36 Units into the skin 2 (two) times daily. 01/04/23  Yes Nida, Denman George, MD  JARDIANCE 10 MG TABS tablet Take 25 mg by mouth every morning.   Yes [provider]  lisinopril (ZESTRIL) 20 MG tablet Take 20 mg by mouth daily. 03/04/23  Yes [provider]  pantoprazole (PROTONIX) 40 MG tablet Take 1 tablet by mouth daily. 11/23/21  Yes [provider]  tamsulosin (FLOMAX) 0.4 MG CAPS capsule Take 0.4 mg by mouth at bedtime.   Yes [provider]  tirzepatide Greggory Keen) 10 MG/0.5ML Pen Inject 10 mg into the skin once a week. Friday's   Yes [provider]  cloNIDine (CATAPRES) 0.2 MG tablet Take 0.2 mg by mouth 2 (two) times daily. 12/15/22 04/27/23  [provider]     Family History  Problem Relation Age of Onset   Aneurysm Mother        brain   Hypertension Mother    Cancer Father    Heart disease Father     Diabetes Father    Hypertension Father    Hyperlipidemia Father    Cancer Maternal Grandmother        melanoma   Heart disease Paternal Grandfather    Colon cancer Neg Hx     Social History   Socioeconomic History   Marital status: Married    Spouse name: Statistician   Number of children: 1   Years of education: 12   Highest education level: Not on file  Occupational History   Occupation: Architect: [employer]    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  Tobacco Use   Smoking status: Never   Smokeless tobacco: Current    Types: Snuff  Vaping Use   Vaping Use: Never used  Substance and Sexual Activity   Alcohol use: Not Currently    Comment: seldom   Drug use: Never   Sexual activity: Yes    Birth control/protection: Surgical  Other Topics Concern   Not on file  Social History Narrative   Lives with wife   [place]   Caffeine- diet Mtn Dew, 2 glasses   Social Determinants of Health  Financial Resource Strain: Not on file  Food Insecurity: Not on file  Transportation Needs: Not on file  Physical Activity: Not on file  Stress: Not on file  Social Connections: Not on file    Review of Systems: A 12 point ROS discussed and pertinent positives are indicated in the HPI above.  All other systems are negative.  Review of Systems  Constitutional:  Negative for activity change, fatigue and fever.  Respiratory:  Negative for cough and shortness of breath.   Cardiovascular:  Negative for chest pain.  Gastrointestinal:  Negative for abdominal pain.  Psychiatric/Behavioral:  Negative for behavioral problems and confusion.     Vital Signs: BP (!) 186/86   Pulse 61   Temp (!) 97.4 F (36.3 C) (Oral)   Resp 18   Ht 5\' 8"  (1.727 m)   Wt 203 lb (92.1 kg)   SpO2 98%   BMI 30.87 kg/m     Physical Exam Vitals reviewed.  HENT:     Mouth/Throat:     Mouth: Mucous membranes are moist.  Cardiovascular:     Rate and Rhythm: Normal  rate and regular rhythm.     Heart sounds: Normal heart sounds.  Pulmonary:     Effort: Pulmonary effort is normal.     Breath sounds: Normal breath sounds.  Abdominal:     Palpations: Abdomen is soft.  Musculoskeletal:        General: Normal range of motion.  Skin:    General: Skin is warm.  Neurological:     Mental Status: He is alert and oriented to person, place, and time.  Psychiatric:        Behavior: Behavior normal.     Imaging: IR BONE MARROW BIOPSY & ASPIRATION  Result Date: 04/27/2023 CLINICAL DATA:  Anemia EXAM: FLUOROSCOPIC GUIDED DEEP ILIAC BONE ASPIRATION AND CORE BIOPSY TECHNIQUE: Patient was placed prone on the fluoro table and limited images through the pelvis were obtained. Appropriate skin entry site was identified. Skin site was marked, prepped with chlorhexidine, draped in usual sterile fashion, and infiltrated locally with 1% lidocaine. Intravenous Fentanyl and Versed 2mg  were administered as conscious sedation during continuous monitoring of the patient's level of consciousness and physiological / cardiorespiratory status by the radiology RN, with a total moderate sedation time of 8 minutes. Under fluoroscopic guidance an 11-gauge Cook trocar bone needle was advanced into the left iliac bone just lateral to the sacroiliac joint. Once needle tip position was confirmed, core and aspiration samples were obtained, submitted to pathology for approval. Patient tolerated procedure well. COMPLICATIONS: COMPLICATIONS none IMPRESSION: 1. Technically successful fluoro guided left iliac bone core and aspiration biopsy. Electronically Signed   By: Corlis Leak M.D.   On: 04/27/2023 14:31    Labs:  CBC: Recent Labs    02/10/23 1510 03/14/23 1458 04/01/23 1433 04/27/23 0751 05/02/23 1448  WBC 4.5  --  4.8 4.5 5.2  HGB 9.9* 9.5* 10.2* 10.2* 10.3*  HCT 31.1* 29.5* 32.1* 33.4* 32.2*  PLT 190  --  216 169 197    COAGS: No results for input(s): "INR", "APTT" in the  last 8760 hours.  BMP: Recent Labs    02/03/23 1849 02/10/23 1510 04/01/23 1433 05/02/23 1448  NA 137 138 139 138  K 3.6 3.7 3.9 3.6  CL 108 107 110 111  CO2 20* 25 21* 19*  GLUCOSE 155* 238* 227* 127*  BUN 32* 42* 32* 43*  CALCIUM 8.3* 8.3* 8.3* 8.3*  CREATININE 1.89* 2.24*  2.07* 2.13*  GFRNONAA 40* 33* 36* 35*    LIVER FUNCTION TESTS: Recent Labs    02/02/23 1441 04/01/23 1433  BILITOT 0.4 0.8  AST 22 23  ALT 16 20  ALKPHOS 62 83  PROT 6.0* 6.1*  ALBUMIN 3.1* 3.1*    TUMOR MARKERS: No results for input(s): "AFPTM", "CEA", "CA199", "CHROMGRNA" in the last 8760 hours.  Assessment and Plan:  Scheduled for supra pubic catheter placement Pt is aware of procedure benefits and risks including but not limited to Infection; bleeding; damage to bladder; or surrounding structures Agreeable to proceed Consent signed and in chart  Thank you for this interesting consult.  I greatly enjoyed meeting THOMA PAULSEN and look forward to participating in their care.  A copy of this report was sent to the requesting provider on this date.  Electronically Signed: Robet Leu, PA-C 05/11/2023, 9:17 AM   I spent a total of  30 Minutes   in face to face in clinical consultation, greater than 50% of which was counseling/coordinating care for supra pubic catheter placement

## 2023-05-11 NOTE — Procedures (Signed)
  Procedure:  CT suprapubic catheter placement 14F Preprocedure diagnosis: The encounter diagnosis was Urinary retention. Postprocedure diagnosis: same EBL:    minimal Complications:   none immediate  See full dictation in [facility] PACS.  D. Nasia Cannan MD Main # [phone] Pager  [phone] Mobile [phone]    

## 2023-05-12 ENCOUNTER — Telehealth: Payer: Self-pay | Admitting: Pulmonary Disease

## 2023-05-12 DIAGNOSIS — G4733 Obstructive sleep apnea (adult) (pediatric): Secondary | ICD-10-CM

## 2023-05-12 NOTE — Procedures (Signed)
Patient Name: Brian Nielsen, Brian Nielsen Date: 05/06/2023 Gender: Male D.O.B: 02/23/1961 Age (years): 85 Referring Provider: Cyril Mourning MD, ABSM Height (inches): 68 Interpreting Physician: Cyril Mourning MD, ABSM Weight (lbs): 205 RPSGT: Lowry Ram BMI: 31 MRN: [id number] Neck Size: 16.50 <br> <br> CLINICAL INFORMATION The patient is referred for a CPAP titration to treat sleep apnea. HST 02/2023 showed moderate OSA with AHI 16/ hr, low sat 70%   Due to severity of desaturation & his prior problems with CPAP, suggested CPAP titration study    SLEEP STUDY TECHNIQUE As per the AASM Manual for the Scoring of Sleep and Associated Events v2.3 (April 2016) with a hypopnea requiring 4% desaturations.  The channels recorded and monitored were frontal, central and occipital EEG, electrooculogram (EOG), submentalis EMG (chin), nasal and oral airflow, thoracic and abdominal wall motion, anterior tibialis EMG, snore microphone, electrocardiogram, and pulse oximetry. Continuous positive airway pressure (CPAP) was initiated at the beginning of the study and titrated to treat sleep-disordered breathing.  MEDICATIONS Medications self-administered by patient taken the night of the study : N/A  TECHNICIAN COMMENTS Comments added by technician: None Comments added by scorer: N/A RESPIRATORY PARAMETERS Optimal PAP Pressure (cm): 14 AHI at Optimal Pressure (/hr): 0 Overall Minimal O2 (%): 85.0 Supine % at Optimal Pressure (%): 25 Minimal O2 at Optimal Pressure (%): 93.0   SLEEP ARCHITECTURE The study was initiated at 9:22:26 PM and ended at 4:37:20 AM.  Sleep onset time was 0.5 minutes and the sleep efficiency was 78.2%. The total sleep time was 339.9 minutes.  The patient spent 5.7% of the night in stage N1 sleep, 81.8% in stage N2 sleep, 0.0% in stage N3 and 12.5% in REM.Stage REM latency was 107.5 minutes  Wake after sleep onset was 94.5. Alpha intrusion was absent. Supine sleep was  57.34%.  CARDIAC DATA The 2 lead EKG demonstrated sinus rhythm. The mean heart rate was 61.3 beats per minute. Other EKG findings include: None.   LEG MOVEMENT DATA The total Periodic Limb Movements of Sleep (PLMS) were 0. The PLMS index was 0.0. A PLMS index of <15 is considered normal in adults.  IMPRESSIONS - The optimal PAP pressure was 14 cm of water. - Central sleep apnea was not noted during this titration (CAI = 0.2/h). - Moderate oxygen desaturations were observed during this titration (min O2 = 85.0%). - The patient snored with moderate snoring volume during this titration study. - No cardiac abnormalities were observed during this study. - Clinically significant periodic limb movements were not noted during this study. Arousals associated with PLMs were rare.   DIAGNOSIS - Obstructive Sleep Apnea (G47.33)   RECOMMENDATIONS - Trial of CPAP therapy on 14 cm H2O with a Small size Resmed Full Face AirFit F30i mask and heated humidification. - Avoid alcohol, sedatives and other CNS depressants that may worsen sleep apnea and disrupt normal sleep architecture. - Sleep hygiene should be reviewed to assess factors that may improve sleep quality. - Weight management and regular exercise should be initiated or continued. - Return to Sleep Center for re-evaluation after 4 weeks of therapy  [Electronically signed] 05/12/2023 06:58 AM  Cyril Mourning MD, ABSM Diplomate, American Board of Sleep Medicine NPI: [id number]

## 2023-05-12 NOTE — Telephone Encounter (Signed)
Please send Rx for CPAP auto 10-14 cm with small airfit F30i mask OV in 8 weeks

## 2023-05-16 ENCOUNTER — Ambulatory Visit
Admission: RE | Admit: 2023-05-16 | Discharge: 2023-05-16 | Disposition: A | Discharge status: Home / Self Care | Payer: BC Managed Care – PPO | Source: Ambulatory Visit | Attending: Nurse Practitioner | Admitting: Nurse Practitioner

## 2023-05-16 VITALS — BP 126/73 | HR 71 | Temp 99.2°F | Resp 12

## 2023-05-16 DIAGNOSIS — B029 Zoster without complications: Secondary | ICD-10-CM | POA: Diagnosis not present

## 2023-05-16 MED ORDER — VALACYCLOVIR HCL 1 G PO TABS: 1000.0000 mg | ORAL_TABLET | Freq: Three times a day (TID) | ORAL | 0 refills | Status: DC

## 2023-05-16 MED ORDER — VALACYCLOVIR HCL 1 G PO TABS: 1000.0000 mg | ORAL_TABLET | Freq: Two times a day (BID) | ORAL | 0 refills | Status: AC

## 2023-05-16 NOTE — Discharge Instructions (Signed)
Take the Valtrex 1000 g twice daily for 10 days as prescribed to treat shingles. When the blisters open up and drain, please keep them covered with nonadherent gauze. Avoid being around pregnant individuals until the rash fully goes away. Take Tylenol 500 to 1000 mg every 6 hours as needed for pain.

## 2023-05-16 NOTE — ED Provider Notes (Signed)
[facility]    CSN: [id number] Arrival date & time: 05/16/23  1735      History   Chief Complaint Chief Complaint  Patient presents with   Rash    possible shingles? - Entered by patient    HPI Brian Nielsen is a 62 y.o. male.   Patient presents today with painful rash to the right side of his abdomen and back.  Reports the rash started approximately 4 days ago and it is painful and blistered.  He denies itching, wheezing, fevers, nausea/vomiting.  Reports he recently had surgery where a suprapubic catheter was placed because his bladder was "no longer working."  Reports he saw the urologist earlier today and they recommended he be seen either by PCP or an urgent care for likely shingles.  He denies history of similar.  Has not taken anything for the pain.  Reports there are no blisters below the bandage from surgery.     Past Medical History:  Diagnosis Date   Adult celiac disease 2015   followed by dr Servando Snare (GI)   Anemia associated with chronic renal failure    hematologist--- dr Sarajane Jews;  treated with iron infusions   Benign localized prostatic hyperplasia with lower urinary tract symptoms (LUTS)    urologist--- dr Retta Diones   Charcot foot due to diabetes mellitus (HCC)    Chronic constipation    CKD (chronic kidney disease), stage III [facility])    nephrologist--- dr Wolfgang Phoenix;  renal bx 11-20-2021   Diverticulosis of colon    Edema of both lower extremities    GERD (gastroesophageal reflux disease)    History of adenomatous polyp of colon    HTN (hypertension)    Hyperlipidemia, mixed    Peripheral neuropathy    Peyronie's disease    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 meatal stricture    Retinopathy due to secondary diabetes mellitus (HCC)    both eyes  treated with injecitons   Thoracic spondylosis without myelopathy 11/22/2016   Type 2 diabetes mellitus [facility])    endocrinologist--- dr nida   Vitamin D deficiency    Wears hearing aid in both ears     Patient  Active Problem List   Diagnosis Date Noted   OSA (obstructive sleep apnea) 12/28/2022   Extensor carpi ulnaris tendinitis 05/06/2022   Loss of weight    Polyp of ascending colon    Gastritis without bleeding    Symptomatic anemia 05/28/2021   Thoracic spondylosis without myelopathy 11/22/2016   Vitamin D deficiency 11/22/2016   HLD (hyperlipidemia) 10/19/2016   BPH (benign prostatic hyperplasia) 10/19/2016   Essential hypertension, benign 10/19/2016   Diabetes mellitus with stage 3 chronic kidney disease (HCC) 10/19/2016   Celiac sprue 05/23/2014   Anemia, iron deficiency 04/19/2014   GERD (gastroesophageal reflux disease) 04/19/2014    Past Surgical History:  Procedure Laterality Date   BIOPSY N/A 09/07/2021   Procedure: BIOPSY;  Surgeon: Midge Minium, MD;  Location: [facility];  Service: Endoscopy;  Laterality: N/A;   CATARACT EXTRACTION W/ INTRAOCULAR LENS IMPLANT Bilateral 2023   COLONOSCOPY N/A 05/03/2014   Procedure: COLONOSCOPY;  Surgeon: West Bali, MD;  [facility];  Service: Endoscopy;  Laterality: N/A;  12:00   COLONOSCOPY WITH PROPOFOL N/A 09/07/2021   Procedure: COLONOSCOPY WITH PROPOFOL;  Surgeon: Midge Minium, MD;  Location: [facility];  Service: Endoscopy;  Laterality: N/A;   CYSTOSCOPY WITH URETHRAL DILATATION N/A 01/10/2023   Procedure: CYSTOSCOPY;  Surgeon: Marcine Matar, MD;  Location:  [facility];  Service: Urology;  Laterality: N/A;   ESOPHAGOGASTRODUODENOSCOPY N/A 05/03/2014   Procedure: ESOPHAGOGASTRODUODENOSCOPY (EGD);  Surgeon: West Bali, MD;  Location: [facility];  Service: Endoscopy;  Laterality: N/A;   ESOPHAGOGASTRODUODENOSCOPY (EGD) WITH PROPOFOL N/A 06/03/2020   Procedure: ESOPHAGOGASTRODUODENOSCOPY (EGD) WITH PROPOFOL;  Surgeon: Regis Bill, MD;  Location: [facility];  Service: Endoscopy;  Laterality: N/A;   ESOPHAGOGASTRODUODENOSCOPY (EGD) WITH PROPOFOL N/A 09/07/2021    Procedure: ESOPHAGOGASTRODUODENOSCOPY (EGD) WITH PROPOFOL;  Surgeon: Midge Minium, MD;  Location: [facility];  Service: Endoscopy;  Laterality: N/A;  Diabetic   IR BONE MARROW BIOPSY & ASPIRATION  04/27/2023   MEATOTOMY N/A 01/10/2023   Procedure: MEATOTOMY ADULT;  Surgeon: Marcine Matar, MD;  Location: [facility];  Service: Urology;  Laterality: N/A;   PARS PLANA VITRECTOMY Right 03/21/2021   Procedure: PARS PLANA VITRECTOMY 25 GAUGE WITH INJECTION OF ANTIBIOTICS FOR ENDOPHTHALMITIS;  Surgeon: Carmela Rima, MD;  Location: [facility];  Service: Ophthalmology;  Laterality: Right;   POLYPECTOMY N/A 09/07/2021   Procedure: POLYPECTOMY;  Surgeon: Midge Minium, MD;  Location: [facility];  Service: Endoscopy;  Laterality: N/A;   REPAIR EXTENSOR TENDON Right 08/11/2022   Procedure: EXTENSOR CARPI ULNARIS TENDON DEBRIDEMENT, POSSIBLE SUBSHEATH RECONSTRUCTION;  Surgeon: Marlyne Beards, MD;  Location: [facility];  Service: Orthopedics;  Laterality: Right;  or regional plus MAC       Home Medications    Prior to Admission medications   Medication Sig Start Date End Date Taking? Authorizing Provider  valACYclovir (VALTREX) 1000 MG tablet Take 1 tablet (1,000 mg total) by mouth 2 (two) times daily for 10 days. 05/16/23 05/26/23 Yes Valentino Nose, NP  aspirin 81 MG EC tablet Take 81 mg by mouth daily.    [provider]  atorvastatin (LIPITOR) 40 MG tablet Take 1 tablet (40 mg total) by mouth daily. Patient taking differently: Take 40 mg by mouth daily. 10/21/16   Eustace Moore, MD  carvedilol (COREG) 3.125 MG tablet Take 3.125 mg by mouth 2 (two) times daily with a meal. Takes 2 bid. 11/11/22   [provider]  cloNIDine (CATAPRES) 0.2 MG tablet Take 0.2 mg by mouth 2 (two) times daily. 12/15/22 04/27/23  [provider]  furosemide (LASIX) 20 MG tablet Take 20 mg by mouth 2 (two) times daily. 01/28/23   [provider]  gabapentin (NEURONTIN) 300 MG capsule Take 900 mg by mouth 3 (three) times daily. Takes 3 caps twice daily and third time takes 4 caps 02/16/22   [provider]  insulin glargine (LANTUS SOLOSTAR) 100 UNIT/ML Solostar Pen Inject 60 Units into the skin at bedtime. Patient taking differently: Inject 36 Units into the skin 2 (two) times daily. 01/04/23   Roma Kayser, MD  JARDIANCE 10 MG TABS tablet Take 25 mg by mouth every morning.    [provider]  lisinopril (ZESTRIL) 20 MG tablet Take 20 mg by mouth daily. 03/04/23   [provider]  pantoprazole (PROTONIX) 40 MG tablet Take 1 tablet by mouth daily. 11/23/21   [provider]  tamsulosin (FLOMAX) 0.4 MG CAPS capsule Take 0.4 mg by mouth at bedtime.    [provider]  tirzepatide Greggory Keen) 10 MG/0.5ML Pen Inject 10 mg into the skin once a week. Friday's    [provider]    Family History Family History  Problem Relation Age of Onset   Aneurysm Mother  brain   Hypertension Mother    Cancer Father    Heart disease Father    Diabetes Father    Hypertension Father    Hyperlipidemia Father    Cancer Maternal Grandmother        melanoma   Heart disease Paternal Grandfather    Colon cancer Neg Hx     Social History Social History   Tobacco Use   Smoking status: Never   Smokeless tobacco: Current    Types: Snuff  Vaping Use   Vaping Use: Never used  Substance Use Topics   Alcohol use: Not Currently    Comment: seldom   Drug use: Never     Allergies   Duloxetine, Gluten meal, and Codeine   Review of Systems Review of Systems Per HPI  Physical Exam Triage Vital Signs ED Triage Vitals  Enc Vitals Group     BP 05/16/23 1752 126/73     Pulse Rate 05/16/23 1752 71     Resp 05/16/23 1752 12     Temp 05/16/23 1752 99.2 F (37.3 C)     Temp Source 05/16/23 1752 Oral     SpO2 05/16/23 1752 96 %     Weight --      Height --      Head  Circumference --      Peak Flow --      Pain Score 05/16/23 1756 8     Pain Loc --      Pain Edu? --      Excl. in GC? --    No data found.  Updated Vital Signs BP 126/73 (BP Location: Right Arm)   Pulse 71   Temp 99.2 F (37.3 C) (Oral)   Resp 12   SpO2 96%   Visual Acuity Right Eye Distance:   Left Eye Distance:   Bilateral Distance:    Right Eye Near:   Left Eye Near:    Bilateral Near:     Physical Exam Vitals and nursing note reviewed.  Constitutional:      General: He is not in acute distress.    Appearance: Normal appearance. He is not toxic-appearing.  HENT:     Head: Normocephalic and atraumatic.     Mouth/Throat:     Mouth: Mucous membranes are moist.     Pharynx: Oropharynx is clear.  Pulmonary:     Effort: Pulmonary effort is normal. No respiratory distress.  Musculoskeletal:     Comments: Leg bag noted to right lower extremity  Skin:    General: Skin is warm and dry.     Capillary Refill: Capillary refill takes less than 2 seconds.     Coloration: Skin is not jaundiced or pale.     Findings: Erythema and rash present. Rash is vesicular.     Comments: Vesicular rash noted to mid abdomen, wrapping around the trunk to the mid back.  There is surrounding erythema.  No active drainage.  Neurological:     Mental Status: He is alert and oriented to person, place, and time.  Psychiatric:        Behavior: Behavior is cooperative.      UC Treatments / Results  Labs (all labs ordered are listed, but only abnormal results are displayed) Labs Reviewed - No data to display  EKG   Radiology No results found.  Procedures Procedures (including critical care time)  Medications Ordered in UC Medications - No data to display  Initial Impression / Assessment and Plan / UC  Course  I have reviewed the triage vital signs and the nursing notes.  Pertinent labs & imaging results that were available during my care of the patient were reviewed by me and  considered in my medical decision making (see chart for details).   Patient is well-appearing, normotensive, afebrile, not tachycardic, not tachypneic, oxygenating well on room air.    1. Herpes zoster without complication Will treat patient with renally dosed Valtrex 1000 g twice daily for 10 days Wound care discussed Other supportive care discussed with patient  The patient was given the opportunity to ask questions.  All questions answered to their satisfaction.  The patient is in agreement to this plan.    Final Clinical Impressions(s) / UC Diagnoses   Final diagnoses:  Herpes zoster without complication     Discharge Instructions      Take the Valtrex 1000 g twice daily for 10 days as prescribed to treat shingles. When the blisters open up and drain, please keep them covered with nonadherent gauze. Avoid being around pregnant individuals until the rash fully goes away. Take Tylenol 500 to 1000 mg every 6 hours as needed for pain.    ED Prescriptions     Medication Sig Dispense Auth. Provider   valACYclovir (VALTREX) 1000 MG tablet  (Status: Discontinued) Take 1 tablet (1,000 mg total) by mouth every 8 (eight) hours for 10 days. 30 tablet Cathlean Marseilles A, NP   valACYclovir (VALTREX) 1000 MG tablet Take 1 tablet (1,000 mg total) by mouth 2 (two) times daily for 10 days. 20 tablet Valentino Nose, NP      PDMP not reviewed this encounter.   Valentino Nose, NP 05/16/23 1925

## 2023-05-16 NOTE — ED Triage Notes (Signed)
Pt c/o rash possible shingles. Right flank, since Friday pt states it it causing his incision from his procedure to ache.

## 2023-05-20 ENCOUNTER — Other Ambulatory Visit (HOSPITAL_COMMUNITY): Payer: Self-pay | Admitting: Adult Health Nurse Practitioner

## 2023-05-20 ENCOUNTER — Ambulatory Visit (HOSPITAL_COMMUNITY)
Admission: RE | Admit: 2023-05-20 | Discharge: 2023-05-20 | Disposition: A | Discharge status: Home / Self Care | Payer: BC Managed Care – PPO | Source: Ambulatory Visit | Attending: Adult Health Nurse Practitioner | Admitting: Adult Health Nurse Practitioner

## 2023-05-20 DIAGNOSIS — I889 Nonspecific lymphadenitis, unspecified: Secondary | ICD-10-CM

## 2023-05-23 ENCOUNTER — Telehealth: Payer: Self-pay | Admitting: Pulmonary Disease

## 2023-05-23 NOTE — Telephone Encounter (Signed)
PT wife calling upset no one has called to adv both sleep study results and what happens next. Sending back High priority due to cust upset . Please call to advise. Her  # is [phone]

## 2023-05-25 DIAGNOSIS — G4733 Obstructive sleep apnea (adult) (pediatric): Secondary | ICD-10-CM

## 2023-05-25 NOTE — Telephone Encounter (Signed)
Re routing to [employer]. No reply in 2 days when routed to [place]. PT already upset w/no Calls back. TY.

## 2023-05-25 NOTE — Telephone Encounter (Signed)
Called and spoke with patient's wife. She was calling for patient's cpap titration study results. I went over the results from the phone encounter on 06/27. She verbalized understanding. She stated that the mask he used during the cpap titration was uncomfortable on his eyes. He received eye injections every 5 weeks and just had one before his cpap titration. He stated that the pressure from the mask caused his eyes to become irritated for the next few days. She wanted to know if Dr. Vassie Loll could recommend a different mask.   Dr. Vassie Loll, can you please advise? Thanks!

## 2023-05-25 NOTE — Telephone Encounter (Signed)
Called and spoke with patient's wife. She verbalized understanding. I described to her the mask that Dr. Vassie Loll suggested and offered to send a picture via MyChart for her to show the patient. She wants to troubleshoot MyChart because she has not been able to login for the past few days. I have given her my direct number to call me back if she has any issues.   Will leave this encounter open for follow up.

## 2023-06-01 ENCOUNTER — Other Ambulatory Visit: Payer: BC Managed Care – PPO

## 2023-06-01 ENCOUNTER — Ambulatory Visit: Payer: BC Managed Care – PPO | Admitting: Medical Oncology

## 2023-06-01 ENCOUNTER — Ambulatory Visit: Payer: BC Managed Care – PPO

## 2023-06-03 ENCOUNTER — Encounter: Payer: Self-pay | Admitting: Medical Oncology

## 2023-06-03 ENCOUNTER — Inpatient Hospital Stay (HOSPITAL_BASED_OUTPATIENT_CLINIC_OR_DEPARTMENT_OTHER): Payer: BC Managed Care – PPO | Admitting: Medical Oncology

## 2023-06-03 ENCOUNTER — Inpatient Hospital Stay: Payer: BC Managed Care – PPO | Attending: Internal Medicine

## 2023-06-03 ENCOUNTER — Inpatient Hospital Stay: Payer: BC Managed Care – PPO

## 2023-06-03 VITALS — BP 111/66 | HR 71 | Temp 98.6°F | Resp 17 | Wt 199.0 lb

## 2023-06-03 VITALS — BP 120/73 | HR 64

## 2023-06-03 DIAGNOSIS — D509 Iron deficiency anemia, unspecified: Secondary | ICD-10-CM

## 2023-06-03 DIAGNOSIS — D649 Anemia, unspecified: Secondary | ICD-10-CM

## 2023-06-03 DIAGNOSIS — N1832 Chronic kidney disease, stage 3b: Secondary | ICD-10-CM

## 2023-06-03 DIAGNOSIS — D631 Anemia in chronic kidney disease: Secondary | ICD-10-CM

## 2023-06-03 LAB — CBC WITH DIFFERENTIAL (CANCER CENTER ONLY)
Abs Immature Granulocytes: 0.02 10*3/uL (ref 0.00–0.07)
Basophils Absolute: 0 10*3/uL (ref 0.0–0.1)
Basophils Relative: 1 %
Eosinophils Absolute: 0.2 10*3/uL (ref 0.0–0.5)
Eosinophils Relative: 4 %
HCT: 31.5 % — ABNORMAL LOW (ref 39.0–52.0)
Hemoglobin: 10 g/dL — ABNORMAL LOW (ref 13.0–17.0)
Immature Granulocytes: 0 %
Lymphocytes Relative: 12 %
Lymphs Abs: 0.8 10*3/uL (ref 0.7–4.0)
MCH: 27.9 pg (ref 26.0–34.0)
MCHC: 31.7 g/dL (ref 30.0–36.0)
MCV: 87.7 fL (ref 80.0–100.0)
Monocytes Absolute: 0.7 10*3/uL (ref 0.1–1.0)
Monocytes Relative: 10 %
Neutro Abs: 4.7 10*3/uL (ref 1.7–7.7)
Neutrophils Relative %: 73 %
Platelet Count: 195 10*3/uL (ref 150–400)
RBC: 3.59 MIL/uL — ABNORMAL LOW (ref 4.22–5.81)
RDW: 15.2 % (ref 11.5–15.5)
WBC Count: 6.4 10*3/uL (ref 4.0–10.5)
nRBC: 0 % (ref 0.0–0.2)

## 2023-06-03 LAB — BASIC METABOLIC PANEL
Anion gap: 9 (ref 5–15)
BUN: 41 mg/dL — ABNORMAL HIGH (ref 8–23)
CO2: 17 mmol/L — ABNORMAL LOW (ref 22–32)
Calcium: 8.6 mg/dL — ABNORMAL LOW (ref 8.9–10.3)
Chloride: 109 mmol/L (ref 98–111)
Creatinine, Ser: 2.52 mg/dL — ABNORMAL HIGH (ref 0.61–1.24)
GFR, Estimated: 28 mL/min — ABNORMAL LOW (ref >60)
Glucose, Bld: 185 mg/dL — ABNORMAL HIGH (ref 70–99)
Potassium: 4.4 mmol/L (ref 3.5–5.1)
Sodium: 135 mmol/L (ref 135–145)

## 2023-06-03 MED ORDER — SODIUM CHLORIDE 0.9 % IV SOLN
200.0000 mg | Freq: Once | INTRAVENOUS | Status: AC
  Administered 2023-06-03: 200 mg via INTRAVENOUS
  Filled 2023-06-03: qty 200

## 2023-06-03 MED ORDER — SODIUM CHLORIDE 0.9 % IV SOLN
Freq: Once | INTRAVENOUS | Status: AC
  Filled 2023-06-03: qty 250

## 2023-06-03 NOTE — Patient Instructions (Signed)
Iron Sucrose Injection What is this medication? IRON SUCROSE (EYE ern SOO krose) treats low levels of iron (iron deficiency anemia) in people with kidney disease. Iron is a mineral that plays an important role in making red blood cells, which carry oxygen from your lungs to the rest of your body. This medicine may be used for other purposes; ask your health care provider or pharmacist if you have questions. COMMON BRAND NAME(S): Venofer What should I tell my care team before I take this medication? They need to know if you have any of these conditions: Anemia not caused by low iron levels Heart disease High levels of iron in the blood Kidney disease Liver disease An unusual or allergic reaction to iron, other medications, foods, dyes, or preservatives Pregnant or trying to get pregnant Breastfeeding How should I use this medication? This medication is for infusion into a vein. It is given in a hospital or clinic setting. Talk to your care team about the use of this medication in children. While this medication may be prescribed for children as young as 2 years for selected conditions, precautions do apply. Overdosage: If you think you have taken too much of this medicine contact a poison control center or emergency room at once. NOTE: This medicine is only for you. Do not share this medicine with others. What if I miss a dose? Keep appointments for follow-up doses. It is important not to miss your dose. Call your care team if you are unable to keep an appointment. What may interact with this medication? Do not take this medication with any of the following: Deferoxamine Dimercaprol Other iron products This medication may also interact with the following: Chloramphenicol Deferasirox This list may not describe all possible interactions. Give your health care provider a list of all the medicines, herbs, non-prescription drugs, or dietary supplements you use. Also tell them if you smoke,  drink alcohol, or use illegal drugs. Some items may interact with your medicine. What should I watch for while using this medication? Visit your care team regularly. Tell your care team if your symptoms do not start to get better or if they get worse. You may need blood work done while you are taking this medication. You may need to follow a special diet. Talk to your care team. Foods that contain iron include: whole grains/cereals, dried fruits, beans, or peas, leafy green vegetables, and organ meats (liver, kidney). What side effects may I notice from receiving this medication? Side effects that you should report to your care team as soon as possible: Allergic reactions--skin rash, itching, hives, swelling of the face, lips, tongue, or throat Low blood pressure--dizziness, feeling faint or lightheaded, blurry vision Shortness of breath Side effects that usually do not require medical attention (report to your care team if they continue or are bothersome): Flushing Headache Joint pain Muscle pain Nausea Pain, redness, or irritation at injection site This list may not describe all possible side effects. Call your doctor for medical advice about side effects. You may report side effects to FDA at 1-800-FDA-1088. Where should I keep my medication? This medication is given in a hospital or clinic. It will not be stored at home. NOTE: This sheet is a summary. It may not cover all possible information. If you have questions about this medicine, talk to your doctor, pharmacist, or health care provider.  2024 Elsevier/Gold Standard (2023-04-08 00:00:00)

## 2023-06-03 NOTE — Progress Notes (Signed)
[facility] Office Visit Follow Up Note  Patient Care Team: Patient, [name] as PCP - General (General Practice) Otelia Sergeant, NP (Inactive) as Nurse Practitioner (Endocrinology) Earna Coder, MD as Consulting Physician (Oncology)  CHIEF COMPLAINTS/PURPOSE OF CONSULTATION: ANEMIA  HEMATOLOGY HISTORY:  # ANEMIA PROGRESSIVE since 2020 12; 2022- 10 MCV- 90s; colonoscopy 2015; multiple endoscopies-last September 2021-biopsy active celiac disease; MAY 2024- Bone marrow biopsy/aspiration- The bone marrow is generally normocellular for age with trilineage  hematopoiesis and nonspecific changes, likely secondary in nature in  this setting.  There is no morphologic evidence of a lymphoproliferative  process or plasma cell neoplasm.   #CKD stage III [[facility]]/diabetes [[facility]]; ? Celaic disease [[facility]];    - Labs: 07/2014 - tTG IgA 101 03/2020 - positive endomysial Ab IgA, tTG IgA 78 - CSY: 07/2014 - normal appearing terminal ileum, sigmoid diverticulosis, and non-bleeding internal hemorrhoids - EGD: 07/2014 - mild non-erosive gastritis, normal appearing duodenal mucosa with duodenal biopsies showing celiac sprue - EGD: 06/03/2020 - procedure aborted due to presence of food - EGD: 08/12/2020 - irregular Z-line found at [facility] with bx showing mild reflux esophagitis, 1 cm hiatal hernia, mild chronic gastritis, decreased folds found in entire duodenum, flattening found in entire duodenum and scalloped mucosa with bx showing active Celiac disease with villous atrophy, increased intraepithelial lymphocytes, etc. Repeat in 1-year.  HISTORY OF PRESENTING ILLNESS: Alone.  Ambulating independently.  Jane Canary 62 y.o.  male symptomatic anemia-likely secondary to chronic renal disease and other-multiple comorbidities poorly controlled diabetes-complications of CKD; peripheral neuropathy; anemia-Venofer/Retacrit is here for follow-up-  He reports that he is doing  just ok. He was diagnosed with Shingles 2 weeks ago and still has pain on the skin of his lower torso-right side.   He continued to have light pink urine in his catheter bag- followed by urology. No other sources of bleeding. No fevers.   Patient continues to complain of pain in right foot.   Otherwise no nausea no vomiting.   Review of Systems  Constitutional:  Positive for malaise/fatigue and weight loss. Negative for chills, diaphoresis and fever.  HENT:  Negative for nosebleeds and sore throat.   Eyes:  Negative for double vision.  Respiratory:  Positive for shortness of breath. Negative for cough, hemoptysis, sputum production and wheezing.   Cardiovascular:  Negative for chest pain, palpitations, orthopnea and leg swelling.  Gastrointestinal:  Negative for blood in stool, heartburn, melena, nausea and vomiting.  Genitourinary:  Positive for hematuria. Negative for dysuria, frequency and urgency.  Musculoskeletal:  Positive for back pain and joint pain.  Skin: Negative.  Negative for itching and rash.  Neurological:  Positive for weakness. Negative for dizziness, tingling, focal weakness and headaches.  Endo/Heme/Allergies:  Does not bruise/bleed easily.  Psychiatric/Behavioral:  Negative for depression. The patient is not nervous/anxious and does not have insomnia.     MEDICAL HISTORY:  Past Medical History:  Diagnosis Date   Adult celiac disease 2015   followed by dr Servando Snare (GI)   Anemia associated with chronic renal failure    hematologist--- dr Sarajane Jews;  treated with iron infusions   Benign localized prostatic hyperplasia with lower urinary tract symptoms (LUTS)    urologist--- dr Retta Diones   Charcot foot due to diabetes mellitus (HCC)    Chronic constipation    CKD (chronic kidney disease), stage III [facility])    nephrologist--- dr Wolfgang Phoenix;  renal bx 11-20-2021   Diverticulosis of colon  Edema of both lower extremities    GERD (gastroesophageal reflux disease)    History  of adenomatous polyp of colon    HTN (hypertension)    Hyperlipidemia, mixed    Peripheral neuropathy    Peyronie's disease    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 meatal stricture    Retinopathy due to secondary diabetes mellitus (HCC)    both eyes  treated with injecitons   Thoracic spondylosis without myelopathy 11/22/2016   Type 2 diabetes mellitus [facility])    endocrinologist--- dr nida   Vitamin D deficiency    Wears hearing aid in both ears     SURGICAL HISTORY: Past Surgical History:  Procedure Laterality Date   BIOPSY N/A 09/07/2021   Procedure: BIOPSY;  Surgeon: Midge Minium, MD;  Location: [facility];  Service: Endoscopy;  Laterality: N/A;   CATARACT EXTRACTION W/ INTRAOCULAR LENS IMPLANT Bilateral 2023   COLONOSCOPY N/A 05/03/2014   Procedure: COLONOSCOPY;  Surgeon: West Bali, MD;  [facility];  Service: Endoscopy;  Laterality: N/A;  12:00   COLONOSCOPY WITH PROPOFOL N/A 09/07/2021   Procedure: COLONOSCOPY WITH PROPOFOL;  Surgeon: Midge Minium, MD;  Location: [facility];  Service: Endoscopy;  Laterality: N/A;   CYSTOSCOPY WITH URETHRAL DILATATION N/A 01/10/2023   Procedure: CYSTOSCOPY;  Surgeon: Marcine Matar, MD;  Location: [facility];  Service: Urology;  Laterality: N/A;   ESOPHAGOGASTRODUODENOSCOPY N/A 05/03/2014   Procedure: ESOPHAGOGASTRODUODENOSCOPY (EGD);  Surgeon: West Bali, MD;  [facility];  Service: Endoscopy;  Laterality: N/A;   ESOPHAGOGASTRODUODENOSCOPY (EGD) WITH PROPOFOL N/A 06/03/2020   Procedure: ESOPHAGOGASTRODUODENOSCOPY (EGD) WITH PROPOFOL;  Surgeon: Regis Bill, MD;  Location: [facility];  Service: Endoscopy;  Laterality: N/A;   ESOPHAGOGASTRODUODENOSCOPY (EGD) WITH PROPOFOL N/A 09/07/2021   Procedure: ESOPHAGOGASTRODUODENOSCOPY (EGD) WITH PROPOFOL;  Surgeon: Midge Minium, MD;  Location: [facility];  Service: Endoscopy;  Laterality: N/A;  Diabetic   IR BONE MARROW BIOPSY  & ASPIRATION  04/27/2023   MEATOTOMY N/A 01/10/2023   Procedure: MEATOTOMY ADULT;  Surgeon: Marcine Matar, MD;  Location: [facility];  Service: Urology;  Laterality: N/A;   PARS PLANA VITRECTOMY Right 03/21/2021   Procedure: PARS PLANA VITRECTOMY 25 GAUGE WITH INJECTION OF ANTIBIOTICS FOR ENDOPHTHALMITIS;  Surgeon: Carmela Rima, MD;  Location[facility];  Service: Ophthalmology;  Laterality: Right;   POLYPECTOMY N/A 09/07/2021   Procedure: POLYPECTOMY;  Surgeon: Midge Minium, MD;  Location: [facility];  Service: Endoscopy;  Laterality: N/A;   REPAIR EXTENSOR TENDON Right 08/11/2022   Procedure: EXTENSOR CARPI ULNARIS TENDON DEBRIDEMENT, POSSIBLE SUBSHEATH RECONSTRUCTION;  Surgeon: Marlyne Beards, MD;  Location: [facility];  Service: Orthopedics;  Laterality: Right;  or regional plus MAC    SOCIAL HISTORY: Social History   Socioeconomic History   Marital status: Married    Spouse name: [name]   Number of children: 1   Years of education: 12   Highest education level: Not on file  Occupational History   Occupation: Architect: [employer]    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  Tobacco Use   Smoking status: Never   Smokeless tobacco: Current    Types: Snuff  Vaping Use   Vaping status: Never Used  Substance and Sexual Activity   Alcohol use: Not Currently    Comment: seldom   Drug use: Never   Sexual activity: Yes    Birth control/protection: Surgical  Other Topics Concern   Not on file  Social History  Narrative   Lives with wife   [place]   Caffeine- diet Mtn Dew, 2 glasses   Social Determinants of Health   Financial Resource Strain: Low Risk  (02/08/2023)   Received from [facility], [facility]   Overall Financial Resource Strain (CARDIA)    Difficulty of Paying Living Expenses: Not hard at all  Food Insecurity: No Food Insecurity (02/08/2023)   Received from [facility],  [facility]   Hunger Vital Sign    Worried About Running Out of Food in the Last Year: Never true    Ran Out of Food in the Last Year: Never true  Transportation Needs: No Transportation Needs (02/08/2023)   Received from [facility], [facility]   PRAPARE - Transportation    Lack of Transportation (Medical): No    Lack of Transportation (Non-Medical): No  Physical Activity: Patient Declined (02/08/2023)   Received from [facility], [facility]   Exercise Vital Sign    Days of Exercise per Week: Patient declined    Minutes of Exercise per Session: Patient declined  Stress: No Stress Concern Present (02/08/2023)   Received from [facility], [facility] [employer] - Occupational Stress Questionnaire    Feeling of Stress : Not at all  Social Connections: Socially Integrated (02/08/2023)   Received from [facility], [facility]   Social Network    How would you rate your social network (family, work, friends)?: Good participation with social networks  Intimate Partner Violence: Not At Risk (02/08/2023)   Received from [facility], [facility]   HITS    Over the last 12 months how often did your partner physically hurt you?: 1    Over the last 12 months how often did your partner insult you or talk down to you?: 1    Over the last 12 months how often did your partner threaten you with physical harm?: 1    Over the last 12 months how often did your partner scream or curse at you?: 1    FAMILY HISTORY: Family History  Problem Relation Age of Onset   Aneurysm Mother        brain   Hypertension Mother    Cancer Father    Heart disease Father    Diabetes Father    Hypertension Father    Hyperlipidemia Father    Cancer Maternal Grandmother        melanoma   Heart disease Paternal Grandfather    Colon cancer Neg Hx     ALLERGIES:  is allergic to duloxetine, gluten meal, and codeine.  MEDICATIONS:  Current Outpatient Medications   Medication Sig Dispense Refill   aspirin 81 MG EC tablet Take 81 mg by mouth daily.     atorvastatin (LIPITOR) 40 MG tablet Take 1 tablet (40 mg total) by mouth daily. (Patient taking differently: Take 40 mg by mouth daily.) 90 tablet 3   carvedilol (COREG) 3.125 MG tablet Take 3.125 mg by mouth 2 (two) times daily with a meal. Takes 2 bid.     furosemide (LASIX) 20 MG tablet Take 20 mg by mouth 2 (two) times daily.     lisinopril (ZESTRIL) 20 MG tablet Take 20 mg by mouth daily.     pantoprazole (PROTONIX) 40 MG tablet Take 1 tablet by mouth daily.     cloNIDine (CATAPRES) 0.2 MG tablet Take 0.2 mg by mouth 2 (two) times daily.     gabapentin (NEURONTIN)  300 MG capsule Take 900 mg by mouth 3 (three) times daily. Takes 3 caps twice daily and third time takes 4 caps     insulin glargine (LANTUS SOLOSTAR) 100 UNIT/ML Solostar Pen Inject 60 Units into the skin at bedtime. (Patient taking differently: Inject 36 Units into the skin 2 (two) times daily.) 60 mL 0   JARDIANCE 10 MG TABS tablet Take 25 mg by mouth every morning.     tamsulosin (FLOMAX) 0.4 MG CAPS capsule Take 0.4 mg by mouth at bedtime.     tirzepatide (MOUNJARO) 10 MG/0.5ML Pen Inject 10 mg into the skin once a week. Friday's     No current facility-administered medications for this visit.      PHYSICAL EXAMINATION:   Vitals:   06/03/23 1447  BP: 111/66  Pulse: 71  Resp: 17  Temp: 98.6 F (37 C)  SpO2: 99%    Filed Weights   06/03/23 1435  Weight: 199 lb (90.3 kg)    Physical Exam Vitals and nursing note reviewed.  Constitutional:      Comments:    HENT:     Head: Normocephalic and atraumatic.     Mouth/Throat:     Pharynx: Oropharynx is clear.  Eyes:     Extraocular Movements: Extraocular movements intact.     Pupils: Pupils are equal, round, and reactive to light.  Cardiovascular:     Rate and Rhythm: Normal rate and regular rhythm.  Pulmonary:     Comments: Decreased breath sounds bilaterally.   Abdominal:     Palpations: Abdomen is soft.  Musculoskeletal:        General: Normal range of motion.     Cervical back: Normal range of motion.  Skin:    General: Skin is warm.  Neurological:     General: No focal deficit present.     Mental Status: He is alert and oriented to person, place, and time.  Psychiatric:        Behavior: Behavior normal.        Judgment: Judgment normal.     LABORATORY DATA:  I have reviewed the data as listed Lab Results  Component Value Date   WBC 6.4 06/03/2023   HGB 10.0 (L) 06/03/2023   HCT 31.5 (L) 06/03/2023   MCV 87.7 06/03/2023   PLT 195 06/03/2023   Recent Labs    02/02/23 1441 02/03/23 1849 04/01/23 1433 05/02/23 1448 06/03/23 1425  NA 134*   < > 139 138 135  K 3.6   < > 3.9 3.6 4.4  CL 105   < > 110 111 109  CO2 23   < > 21* 19* 17*  GLUCOSE 295*   < > 227* 127* 185*  BUN 31*   < > 32* 43* 41*  CREATININE 1.87*   < > 2.07* 2.13* 2.52*  CALCIUM 8.4*   < > 8.3* 8.3* 8.6*  GFRNONAA 40*   < > 36* 35* 28*  PROT 6.0*  --  6.1*  --   --   ALBUMIN 3.1*  --  3.1*  --   --   AST 22  --  23  --   --   ALT 16  --  20  --   --   ALKPHOS 62  --  83  --   --   BILITOT 0.4  --  0.8  --   --    < > = values in this interval not displayed.   Assessment and Plan  Symptomatic anemia #Anemia-symptomatic fatigue hemoglobin 9-10--likely multifactorial-iron malabsorption/celiac disease/CKD-stage MAY 2024- Bone marrow biopsy/aspiration- The bone marrow is generally normocellular for age with trilineage  hematopoiesis and nonspecific changes, likely secondary in nature in  this setting.  There is no morphologic evidence of a lymphoproliferative  process or plasma cell neoplasm.    Tolerating Venofer treatments well. Has mild hematuria which he is followed by Urology. Given the ongoing loss through blood will give Venofer today and recheck iron studies at his next visit.  Retacrit as needed to keep hemoglobin greater than 10.   # Hemoglobin  today is 10.0; status post retacrit approximately 2 months ago.  Most recent iron studies from June show an iron saturation of 21% and a ferritin of 217- felt to be falsely elevated.    # Ckd stage III [GFR- 31; nephro, [place]]-diabetes- GFR-28 stable.     # Diabetes [[facility] Hb A1c- 7.4]-PBG 127  [steroid injectio]-continue follow-up with endocrinology/monitoring of blood sugars. Stable.   #Shingles- Discussed post herpetic neuralgia. He will discuss with PCP further.    # DISPOSITION: Venofer today; NO retacrit # follow up in 1 month; MD ; labs- cbc/bmp, iron, ferritin,retic ;possible venofer or retacrit  # follow up in 2 month; APP;  labs- cbc/bmp, retic ;possible venofer or retacrit -[place]  All questions were answered. The patient knows to call the clinic with any problems, questions or concerns.    Rushie Chestnut, PA-C 06/03/2023 3:07 PM

## 2023-06-03 NOTE — Patient Instructions (Signed)
Medications like gabapentin can be used to help with post herpetic neuralgia- pain following shingles-

## 2023-06-03 NOTE — Progress Notes (Signed)
Shingles present right side of abd around to his back. Painful now on the inside. Rash is dried and healing on the outside.

## 2023-06-07 NOTE — Telephone Encounter (Signed)
See mychart message from 7/10.

## 2023-06-14 ENCOUNTER — Other Ambulatory Visit (HOSPITAL_COMMUNITY): Payer: Self-pay

## 2023-06-14 ENCOUNTER — Encounter: Payer: Self-pay | Admitting: Internal Medicine

## 2023-06-14 MED ORDER — MOUNJARO 12.5 MG/0.5ML ~~LOC~~ SOAJ
12.5000 mg | SUBCUTANEOUS | 3 refills | Status: DC
  Filled 2023-06-14 – 2023-06-16 (×2): qty 2, 28d supply, fill #0
  Filled 2023-07-07: qty 2, 28d supply, fill #1
  Filled 2023-08-02: qty 2, 28d supply, fill #2

## 2023-06-15 ENCOUNTER — Encounter: Payer: Self-pay | Admitting: Internal Medicine

## 2023-06-15 ENCOUNTER — Other Ambulatory Visit (HOSPITAL_COMMUNITY): Payer: Self-pay

## 2023-06-16 ENCOUNTER — Encounter: Payer: Self-pay | Admitting: Internal Medicine

## 2023-06-16 ENCOUNTER — Other Ambulatory Visit (HOSPITAL_COMMUNITY): Payer: Self-pay

## 2023-06-19 ENCOUNTER — Emergency Department (HOSPITAL_COMMUNITY)
Admission: EM | Admit: 2023-06-19 | Discharge: 2023-06-19 | Discharge status: Absent without leave | Payer: BC Managed Care – PPO | Source: Home / Self Care

## 2023-06-21 ENCOUNTER — Telehealth: Payer: Self-pay | Admitting: Pulmonary Disease

## 2023-06-21 NOTE — Telephone Encounter (Signed)
Patient's spouse, [name] called and states no one really explained sleep study results and next steps. A CPAP was ordered, but patient would like to talk with someone before setting up the CPAP machine. Patient would like a phone call at [phone], if patient does not answer, okay to call spouse at [phone].

## 2023-06-23 NOTE — Telephone Encounter (Signed)
Pt wife returning a missed call

## 2023-06-23 NOTE — Telephone Encounter (Signed)
Left message for patient to schedule appointment to discuss concerns or send a mychart message.

## 2023-06-23 NOTE — Telephone Encounter (Signed)
Spoke with patient and wife.  Clarified any questions.  They will follow up with [facility].

## 2023-07-01 ENCOUNTER — Encounter: Payer: Self-pay | Admitting: Internal Medicine

## 2023-07-01 ENCOUNTER — Inpatient Hospital Stay: Payer: BC Managed Care – PPO | Admitting: Internal Medicine

## 2023-07-01 ENCOUNTER — Inpatient Hospital Stay: Payer: BC Managed Care – PPO | Attending: Internal Medicine

## 2023-07-01 ENCOUNTER — Inpatient Hospital Stay: Payer: BC Managed Care – PPO

## 2023-07-01 VITALS — BP 141/66 | HR 54 | Temp 96.8°F | Ht 68.0 in | Wt 199.6 lb

## 2023-07-01 DIAGNOSIS — D649 Anemia, unspecified: Secondary | ICD-10-CM

## 2023-07-01 DIAGNOSIS — N183 Chronic kidney disease, stage 3 unspecified: Secondary | ICD-10-CM | POA: Diagnosis not present

## 2023-07-01 DIAGNOSIS — E1122 Type 2 diabetes mellitus with diabetic chronic kidney disease: Secondary | ICD-10-CM | POA: Diagnosis not present

## 2023-07-01 DIAGNOSIS — I129 Hypertensive chronic kidney disease with stage 1 through stage 4 chronic kidney disease, or unspecified chronic kidney disease: Secondary | ICD-10-CM | POA: Insufficient documentation

## 2023-07-01 LAB — BASIC METABOLIC PANEL
Anion gap: 6 (ref 5–15)
BUN: 30 mg/dL — ABNORMAL HIGH (ref 8–23)
CO2: 20 mmol/L — ABNORMAL LOW (ref 22–32)
Calcium: 8.3 mg/dL — ABNORMAL LOW (ref 8.9–10.3)
Chloride: 113 mmol/L — ABNORMAL HIGH (ref 98–111)
Creatinine, Ser: 1.8 mg/dL — ABNORMAL HIGH (ref 0.61–1.24)
GFR, Estimated: 42 mL/min — ABNORMAL LOW (ref >60)
Glucose, Bld: 161 mg/dL — ABNORMAL HIGH (ref 70–99)
Potassium: 4.5 mmol/L (ref 3.5–5.1)
Sodium: 139 mmol/L (ref 135–145)

## 2023-07-01 LAB — CBC WITH DIFFERENTIAL (CANCER CENTER ONLY)
Abs Immature Granulocytes: 0.01 10*3/uL (ref 0.00–0.07)
Basophils Absolute: 0 10*3/uL (ref 0.0–0.1)
Basophils Relative: 1 %
Eosinophils Absolute: 0.3 10*3/uL (ref 0.0–0.5)
Eosinophils Relative: 6 %
HCT: 28.1 % — ABNORMAL LOW (ref 39.0–52.0)
Hemoglobin: 8.6 g/dL — ABNORMAL LOW (ref 13.0–17.0)
Immature Granulocytes: 0 %
Lymphocytes Relative: 16 %
Lymphs Abs: 0.7 10*3/uL (ref 0.7–4.0)
MCH: 28.2 pg (ref 26.0–34.0)
MCHC: 30.6 g/dL (ref 30.0–36.0)
MCV: 92.1 fL (ref 80.0–100.0)
Monocytes Absolute: 0.5 10*3/uL (ref 0.1–1.0)
Monocytes Relative: 12 %
Neutro Abs: 2.8 10*3/uL (ref 1.7–7.7)
Neutrophils Relative %: 65 %
Platelet Count: 180 10*3/uL (ref 150–400)
RBC: 3.05 MIL/uL — ABNORMAL LOW (ref 4.22–5.81)
RDW: 15.9 % — ABNORMAL HIGH (ref 11.5–15.5)
WBC Count: 4.3 10*3/uL (ref 4.0–10.5)
nRBC: 0 % (ref 0.0–0.2)

## 2023-07-01 MED ORDER — SODIUM CHLORIDE 0.9 % IV SOLN
200.0000 mg | Freq: Once | INTRAVENOUS | Status: AC
  Administered 2023-07-01: 200 mg via INTRAVENOUS
  Filled 2023-07-01: qty 200

## 2023-07-01 MED ORDER — SODIUM CHLORIDE 0.9 % IV SOLN
Freq: Once | INTRAVENOUS | Status: AC
  Filled 2023-07-01: qty 250

## 2023-07-01 NOTE — Assessment & Plan Note (Addendum)
#  Anemia-symptomatic fatigue hemoglobin 9-10--likely multifactorial-iron malabsorption/celiac disease/CKD-stage MAY 2024- Bone marrow biopsy/aspiration- The bone marrow is generally normocellular for age with trilineage  hematopoiesis and nonspecific changes, likely secondary in nature in  this setting.  There is no morphologic evidence of a lymphoproliferative  process or plasma cell neoplasm.   status post Venofer; Retacrit as needed to keep hemoglobin greater than 10  # Hemoglobin today is 8.5 - on Venofer/Retacrit- JUNE 2024-iron saturation- 16; ferritin 200;  HOLD retacrit today-  proceed with venofer today  # Right neck LN 2 cm [5 weeks]-Right tonsil enlargement s/p Anti-biotics- awaiting ENT evaluation-   # Ckd stage III [nephro, [place]]-diabetes- GFR- 40s- stable.    # Diabetes [[facility] Hb A1c- 7.4]-PBG 127  [steroid injectio]-continue follow-up with endocrinology/monitoring of blood sugars. Stable.  # DISPOSITION: # ok with Venofer today; NO retacrit # retacrit next week # follow up in 1 month; APP ; labs- cbc/bmp;iron studies; ferritin;  ;possible venofer or retacrit  # follow up in 2 month; MD;  labs- cbc/bmp; ;possible venofer or retacrit -Dr.B

## 2023-07-01 NOTE — Progress Notes (Unsigned)
[facility] CONSULT NOTE  Patient Care Team: Patient, [name] as PCP - General (General Practice) Otelia Sergeant, NP (Inactive) as Nurse Practitioner (Endocrinology) Earna Coder, MD as Consulting Physician (Oncology)  CHIEF COMPLAINTS/PURPOSE OF CONSULTATION: ANEMIA   HEMATOLOGY HISTORY:  # ANEMIA PROGRESSIVE since 2020 12; 2022- 10 MCV- 90s; colonoscopy 2015; multiple endoscopies-last September 2021-biopsy active celiac disease; MAY 2024- Bone marrow biopsy/aspiration- The bone marrow is generally normocellular for age with trilineage  hematopoiesis and nonspecific changes, likely secondary in nature in  this setting.  There is no morphologic evidence of a lymphoproliferative  process or plasma cell neoplasm.   #CKD stage III [[facility]]/diabetes [[facility]]; ? Celaic disease [[facility]];    - Labs: 07/2014 - tTG IgA 101 03/2020 - positive endomysial Ab IgA, tTG IgA 78 - CSY: 07/2014 - normal appearing terminal ileum, sigmoid diverticulosis, and non-bleeding internal hemorrhoids - EGD: 07/2014 - mild non-erosive gastritis, normal appearing duodenal mucosa with duodenal biopsies showing celiac sprue - EGD: 06/03/2020 - procedure aborted due to presence of food - EGD: 08/12/2020 - irregular Z-line found at [facility] with bx showing mild reflux esophagitis, 1 cm hiatal hernia, mild chronic gastritis, decreased folds found in entire duodenum, flattening found in entire duodenum and scalloped mucosa with bx showing active Celiac disease with villous atrophy, increased intraepithelial lymphocytes, etc. Repeat in 1-year.  HISTORY OF PRESENTING ILLNESS: Alone.  Ambulating independently.  Brian Nielsen 62 y.o.  male symptomatic anemia-likely secondary to chronic renal disease and other-multiple comorbidities poorly controlled diabetes-complications of CKD; peripheral neuropathy; anemia-Venofer/Retacrit is here for follow-up.  C/o right flank/side pain, 6/10- patient had  a recent history of shingles. Currently resolved.    Seeing ENT for knot under jaw. Noted swelling for last 5 weeks or so. S/p antibiotics.    Pt states "bladder not working", he now wears a cath/bag.   Does have some trouble sleeping, now has a cpa  Patient continues to complain of pain in right foot.   Otherwise no nausea no vomiting.   Review of Systems  Constitutional:  Positive for malaise/fatigue and weight loss. Negative for chills, diaphoresis and fever.  HENT:  Negative for nosebleeds and sore throat.   Eyes:  Negative for double vision.  Respiratory:  Positive for shortness of breath. Negative for cough, hemoptysis, sputum production and wheezing.   Cardiovascular:  Negative for chest pain, palpitations, orthopnea and leg swelling.  Gastrointestinal:  Negative for blood in stool, heartburn, melena, nausea and vomiting.  Genitourinary:  Negative for dysuria, frequency and urgency.  Musculoskeletal:  Positive for back pain and joint pain.  Skin: Negative.  Negative for itching and rash.  Neurological:  Positive for weakness. Negative for dizziness, tingling, focal weakness and headaches.  Endo/Heme/Allergies:  Does not bruise/bleed easily.  Psychiatric/Behavioral:  Negative for depression. The patient is not nervous/anxious and does not have insomnia.     MEDICAL HISTORY:  Past Medical History:  Diagnosis Date   Adult celiac disease 2015   followed by dr Servando Snare (GI)   Anemia associated with chronic renal failure    hematologist--- dr Sarajane Jews;  treated with iron infusions   Benign localized prostatic hyperplasia with lower urinary tract symptoms (LUTS)    urologist--- dr Retta Diones   Charcot foot due to diabetes mellitus (HCC)    Chronic constipation    CKD (chronic kidney disease), stage III [facility])    nephrologist--- dr Wolfgang Phoenix;  renal bx 11-20-2021   Diverticulosis of colon    Edema  of both lower extremities    GERD (gastroesophageal reflux disease)    History of  adenomatous polyp of colon    HTN (hypertension)    Hyperlipidemia, mixed    Peripheral neuropathy    Peyronie's disease    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 meatal stricture    Retinopathy due to secondary diabetes mellitus (HCC)    both eyes  treated with injecitons   Thoracic spondylosis without myelopathy 11/22/2016   Type 2 diabetes mellitus [facility])    endocrinologist--- dr nida   Vitamin D deficiency    Wears hearing aid in both ears     SURGICAL HISTORY: Past Surgical History:  Procedure Laterality Date   BIOPSY N/A 09/07/2021   Procedure: BIOPSY;  Surgeon: Midge Minium, MD;  Location: [facility];  Service: Endoscopy;  Laterality: N/A;   CATARACT EXTRACTION W/ INTRAOCULAR LENS IMPLANT Bilateral 2023   COLONOSCOPY N/A 05/03/2014   Procedure: COLONOSCOPY;  Surgeon: West Bali, MD;  [facility];  Service: Endoscopy;  Laterality: N/A;  12:00   COLONOSCOPY WITH PROPOFOL N/A 09/07/2021   Procedure: COLONOSCOPY WITH PROPOFOL;  Surgeon: Midge Minium, MD;  Location: [facility];  Service: Endoscopy;  Laterality: N/A;   CYSTOSCOPY WITH URETHRAL DILATATION N/A 01/10/2023   Procedure: CYSTOSCOPY;  Surgeon: Marcine Matar, MD;  Location: [facility];  Service: Urology;  Laterality: N/A;   ESOPHAGOGASTRODUODENOSCOPY N/A 05/03/2014   Procedure: ESOPHAGOGASTRODUODENOSCOPY (EGD);  Surgeon: West Bali, MD;  [facility];  Service: Endoscopy;  Laterality: N/A;   ESOPHAGOGASTRODUODENOSCOPY (EGD) WITH PROPOFOL N/A 06/03/2020   Procedure: ESOPHAGOGASTRODUODENOSCOPY (EGD) WITH PROPOFOL;  Surgeon: Regis Bill, MD;  Location: [facility];  Service: Endoscopy;  Laterality: N/A;   ESOPHAGOGASTRODUODENOSCOPY (EGD) WITH PROPOFOL N/A 09/07/2021   Procedure: ESOPHAGOGASTRODUODENOSCOPY (EGD) WITH PROPOFOL;  Surgeon: Midge Minium, MD;  Location: [facility];  Service: Endoscopy;  Laterality: N/A;  Diabetic   IR BONE MARROW BIOPSY &  ASPIRATION  04/27/2023   MEATOTOMY N/A 01/10/2023   Procedure: MEATOTOMY ADULT;  Surgeon: Marcine Matar, MD;  Location: [facility];  Service: Urology;  Laterality: N/A;   PARS PLANA VITRECTOMY Right 03/21/2021   Procedure: PARS PLANA VITRECTOMY 25 GAUGE WITH INJECTION OF ANTIBIOTICS FOR ENDOPHTHALMITIS;  Surgeon: Carmela Rima, MD;  Location: [facility] OR;  Service: Ophthalmology;  Laterality: Right;   POLYPECTOMY N/A 09/07/2021   Procedure: POLYPECTOMY;  Surgeon: Midge Minium, MD;  Location: [facility];  Service: Endoscopy;  Laterality: N/A;   REPAIR EXTENSOR TENDON Right 08/11/2022   Procedure: EXTENSOR CARPI ULNARIS TENDON DEBRIDEMENT, POSSIBLE SUBSHEATH RECONSTRUCTION;  Surgeon: Marlyne Beards, MD;  Location: [facility];  Service: Orthopedics;  Laterality: Right;  or regional plus MAC    SOCIAL HISTORY: Social History   Socioeconomic History   Marital status: Married    Spouse name: [name]   Number of children: 1   Years of education: 12   Highest education level: Not on file  Occupational History   Occupation: Architect: [employer]    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  Tobacco Use   Smoking status: Never   Smokeless tobacco: Current    Types: Snuff  Vaping Use   Vaping status: Never Used  Substance and Sexual Activity   Alcohol use: Not Currently    Comment: seldom   Drug use: Never   Sexual activity: Yes    Birth control/protection: Surgical  Other Topics Concern   Not on file  Social History Narrative  Lives with wife   [place]   Caffeine- diet Mtn Dew, 2 glasses   Social Determinants of Health   Financial Resource Strain: Low Risk  (02/08/2023)   Received from [facility]   Overall Financial Resource Strain (CARDIA)    Difficulty of Paying Living Expenses: Not hard at all  Food Insecurity: No Food Insecurity (02/08/2023)   Received from [facility],  [facility]   Hunger Vital Sign    Worried About Running Out of Food in the Last Year: Never true    Ran Out of Food in the Last Year: Never true  Transportation Needs: No Transportation Needs (02/08/2023)   Received from [facility]   PRAPARE - Transportation    Lack of Transportation (Medical): No    Lack of Transportation (Non-Medical): No  Physical Activity: Patient Declined (02/08/2023)   Received from [facility], [facility]   Exercise Vital Sign    Days of Exercise per Week: Patient declined    Minutes of Exercise per Session: Patient declined  Stress: No Stress Concern Present (02/08/2023)   Received from [facility] - Occupational Stress Questionnaire    Feeling of Stress : Not at all  Social Connections: Socially Integrated (02/08/2023)   Received from [facility], [facility]   Social Network    How would you rate your social network (family, work, friends)?: Good participation with social networks  Intimate Partner Violence: Not At Risk (02/08/2023)   Received from [facility]   HITS    Over the last 12 months how often did your partner physically hurt you?: 1    Over the last 12 months how often did your partner insult you or talk down to you?: 1    Over the last 12 months how often did your partner threaten you with physical harm?: 1    Over the last 12 months how often did your partner scream or curse at you?: 1    FAMILY HISTORY: Family History  Problem Relation Age of Onset   Aneurysm Mother        brain   Hypertension Mother    Cancer Father    Heart disease Father    Diabetes Father    Hypertension Father    Hyperlipidemia Father    Cancer Maternal Grandmother        melanoma   Heart disease Paternal Grandfather    Colon cancer Neg Hx     ALLERGIES:  is allergic to duloxetine, gluten meal, and codeine.  MEDICATIONS:  Current Outpatient Medications   Medication Sig Dispense Refill   amitriptyline (ELAVIL) 25 MG tablet Take 25 mg by mouth at bedtime.     aspirin 81 MG EC tablet Take 81 mg by mouth daily.     atorvastatin (LIPITOR) 40 MG tablet Take 1 tablet (40 mg total) by mouth daily. (Patient taking differently: Take 40 mg by mouth daily.) 90 tablet 3   carvedilol (COREG) 3.125 MG tablet Take 3.125 mg by mouth 2 (two) times daily with a meal. Takes 2 bid.     insulin glargine (LANTUS SOLOSTAR) 100 UNIT/ML Solostar Pen 35 Units 2 (two) times daily.     JARDIANCE 10 MG TABS tablet Take 10 mg by mouth daily.     lisinopril (ZESTRIL) 20 MG tablet Take 20 mg by mouth daily.     pantoprazole (PROTONIX) 40 MG tablet Take 1  tablet by mouth daily.     tirzepatide (MOUNJARO) 12.5 MG/0.5ML Pen Inject 12.5 mg into the skin once a week. 2 mL 3   tolterodine (DETROL LA) 4 MG 24 hr capsule Take 4 mg by mouth daily.     traMADol (ULTRAM) 50 MG tablet Take 50 mg by mouth every 6 (six) hours as needed.     cloNIDine (CATAPRES) 0.2 MG tablet Take 0.2 mg by mouth 2 (two) times daily.     No current facility-administered medications for this visit.      PHYSICAL EXAMINATION:  S/p Suprapubic catheter. 2cm LN in right neck; Right enlarged tonsil.  Vitals:   07/01/23 1440  BP: (!) 141/66  Pulse: (!) 54  Temp: (!) 96.8 F (36 C)  SpO2: 100%    Filed Weights   07/01/23 1440  Weight: 199 lb 9.6 oz (90.5 kg)     Physical Exam Vitals and nursing note reviewed.  Constitutional:      Comments:    HENT:     Head: Normocephalic and atraumatic.     Mouth/Throat:     Pharynx: Oropharynx is clear.  Eyes:     Extraocular Movements: Extraocular movements intact.     Pupils: Pupils are equal, round, and reactive to light.  Cardiovascular:     Rate and Rhythm: Normal rate and regular rhythm.  Pulmonary:     Comments: Decreased breath sounds bilaterally.  Abdominal:     Palpations: Abdomen is soft.  Musculoskeletal:        General: Normal  range of motion.     Cervical back: Normal range of motion.  Skin:    General: Skin is warm.  Neurological:     General: No focal deficit present.     Mental Status: He is alert and oriented to person, place, and time.  Psychiatric:        Behavior: Behavior normal.        Judgment: Judgment normal.     LABORATORY DATA:  I have reviewed the data as listed Lab Results  Component Value Date   WBC 4.3 07/01/2023   HGB 8.6 (L) 07/01/2023   HCT 28.1 (L) 07/01/2023   MCV 92.1 07/01/2023   PLT 180 07/01/2023   Recent Labs    02/02/23 1441 02/03/23 1849 04/01/23 1433 05/02/23 1448 06/03/23 1425 07/01/23 1442  NA 134*   < > 139 138 135 139  K 3.6   < > 3.9 3.6 4.4 4.5  CL 105   < > 110 111 109 113*  CO2 23   < > 21* 19* 17* 20*  GLUCOSE 295*   < > 227* 127* 185* 161*  BUN 31*   < > 32* 43* 41* 30*  CREATININE 1.87*   < > 2.07* 2.13* 2.52* 1.80*  CALCIUM 8.4*   < > 8.3* 8.3* 8.6* 8.3*  GFRNONAA 40*   < > 36* 35* 28* 42*  PROT 6.0*  --  6.1*  --   --   --   ALBUMIN 3.1*  --  3.1*  --   --   --   AST 22  --  23  --   --   --   ALT 16  --  20  --   --   --   ALKPHOS 62  --  83  --   --   --   BILITOT 0.4  --  0.8  --   --   --    < > =  values in this interval not displayed.     No results found.  Symptomatic anemia #Anemia-symptomatic fatigue hemoglobin 9-10--likely multifactorial-iron malabsorption/celiac disease/CKD-stage MAY 2024- Bone marrow biopsy/aspiration- The bone marrow is generally normocellular for age with trilineage  hematopoiesis and nonspecific changes, likely secondary in nature in  this setting.  There is no morphologic evidence of a lymphoproliferative  process or plasma cell neoplasm.   status post Venofer; Retacrit as needed to keep hemoglobin greater than 10  # Hemoglobin today is 8.5 - on Venofer/Retacrit- JUNE 2024-iron saturation- 16; ferritin 200;  HOLD retacrit today-  proceed with venofer today  # Right neck LN 2 cm [5 weeks]-Right tonsil  enlargement s/p Anti-biotics- awaiting ENT evaluation-   # Ckd stage III [nephro, [place]]-diabetes- GFR- 40s- stable.    # Diabetes [[facility] Hb A1c- 7.4]-PBG 127  [steroid injectio]-continue follow-up with endocrinology/monitoring of blood sugars. Stable.  # DISPOSITION: # ok with Venofer today; NO retacrit # retacrit next week # follow up in 1 month; APP ; labs- cbc/bmp;iron studies; ferritin;  ;possible venofer or retacrit  # follow up in 2 month; MD;  labs- cbc/bmp; ;possible venofer or retacrit -Dr.B  All questions were answered. The patient knows to call the clinic with any problems, questions or concerns.    Earna Coder, MD 07/01/2023 3:37 PM

## 2023-07-01 NOTE — Progress Notes (Unsigned)
C/o right flank/side pain, 6/10.  Seeing ENT for knot under jaw.  Pt states "bladder not working", he now wears a cath/bag.  Does have some trouble sleeping, now has a cpap.

## 2023-07-05 ENCOUNTER — Ambulatory Visit: Payer: BC Managed Care – PPO

## 2023-07-05 ENCOUNTER — Ambulatory Visit: Payer: BC Managed Care – PPO | Admitting: Internal Medicine

## 2023-07-05 ENCOUNTER — Inpatient Hospital Stay: Payer: BC Managed Care – PPO

## 2023-07-05 ENCOUNTER — Other Ambulatory Visit: Payer: BC Managed Care – PPO

## 2023-07-06 ENCOUNTER — Inpatient Hospital Stay: Payer: BC Managed Care – PPO

## 2023-07-06 VITALS — BP 121/56

## 2023-07-06 DIAGNOSIS — D649 Anemia, unspecified: Secondary | ICD-10-CM | POA: Diagnosis not present

## 2023-07-06 LAB — HEMOGLOBIN AND HEMATOCRIT (CANCER CENTER ONLY)
HCT: 28.9 % — ABNORMAL LOW (ref 39.0–52.0)
Hemoglobin: 8.8 g/dL — ABNORMAL LOW (ref 13.0–17.0)

## 2023-07-06 MED ORDER — EPOETIN ALFA-EPBX 20000 UNIT/ML IJ SOLN
20000.0000 [IU] | Freq: Once | INTRAMUSCULAR | Status: AC
  Administered 2023-07-06: 20000 [IU] via SUBCUTANEOUS
  Filled 2023-07-06: qty 1

## 2023-07-07 ENCOUNTER — Encounter: Payer: Self-pay | Admitting: Internal Medicine

## 2023-07-07 ENCOUNTER — Telehealth: Payer: Self-pay | Admitting: Pulmonary Disease

## 2023-07-07 ENCOUNTER — Other Ambulatory Visit (HOSPITAL_COMMUNITY): Payer: Self-pay

## 2023-07-07 NOTE — Telephone Encounter (Signed)
Patient's wife is calling because he needs a follow up appointment with Dr.Alva for his CPAP machine. He is having issues with his CPAP machine . Please call and advise. [phone]

## 2023-07-08 ENCOUNTER — Other Ambulatory Visit: Payer: Self-pay | Admitting: Urology

## 2023-07-11 ENCOUNTER — Other Ambulatory Visit: Payer: Self-pay | Admitting: Otolaryngology

## 2023-07-11 DIAGNOSIS — R221 Localized swelling, mass and lump, neck: Secondary | ICD-10-CM

## 2023-07-11 NOTE — Progress Notes (Signed)
error

## 2023-07-11 NOTE — Telephone Encounter (Signed)
Patient will need to contact DME for assistance with CPAP machine. Message sent via MyChart.  Please contact patient to schedule follow up visit as requested. Thank you!

## 2023-07-12 NOTE — Telephone Encounter (Signed)
Spoke to wife is going to call back if he wants apt. sooner

## 2023-07-13 NOTE — Progress Notes (Signed)
Pernell Dupre, MD sent to [facility] There is insufficient solid material for a core biopsy.  Can be approved for US guided aspiration +/- FNA of right neck lesion.  Elijio Miles

## 2023-07-14 NOTE — Patient Instructions (Signed)
DUE TO COVID-19 ONLY TWO VISITORS  (aged 62 and older)  ARE ALLOWED TO COME WITH YOU AND STAY IN THE WAITING ROOM ONLY DURING PRE OP AND PROCEDURE.   **NO VISITORS ARE ALLOWED IN THE SHORT STAY AREA OR RECOVERY ROOM!!**  IF YOU WILL BE ADMITTED INTO THE HOSPITAL YOU ARE ALLOWED ONLY FOUR SUPPORT PEOPLE DURING VISITATION HOURS ONLY (7 AM -8PM)   The support person(s) must pass our screening, gel in and out, and wear a mask at all times, including in the patient's room. Patients must also wear a mask when staff or their support person are in the room. Visitors GUEST BADGE MUST BE WORN VISIBLY  One adult visitor may remain with you overnight and MUST be in the room by 8 P.M.     Your procedure is scheduled on: 07/26/23   Report to [facility] Main Entrance    Report to admitting at : 9;45 AM   Call this number if you have problems the morning of surgery [phone]   Do not eat food or drink :After Midnight.  FOLLOW ANY ADDITIONAL PRE OP INSTRUCTIONS YOU RECEIVED FROM YOUR SURGEON'S OFFICE!!!   Oral Hygiene is also important to reduce your risk of infection.                                    Remember - BRUSH YOUR TEETH THE MORNING OF SURGERY WITH YOUR REGULAR TOOTHPASTE  DENTURES WILL BE REMOVED PRIOR TO SURGERY PLEASE DO NOT APPLY "Poly grip" OR ADHESIVES!!!   Do NOT smoke after Midnight   Take these medicines the morning of surgery with A SIP OF WATER: clonidine,pantoprazole,tolterodine.Use eye drops as usual.Tramadol as needed.  How to Manage Your Diabetes Before and After Surgery  Why is it important to control my blood sugar before and after surgery? Improving blood sugar levels before and after surgery helps healing and can limit problems. A way of improving blood sugar control is eating a healthy diet by:  Eating less sugar and carbohydrates  Increasing activity/exercise  Talking with your doctor about reaching your blood sugar goals High blood sugars (greater  than 180 mg/dL) can raise your risk of infections and slow your recovery, so you will need to focus on controlling your diabetes during the weeks before surgery. Make sure that the doctor who takes care of your diabetes knows about your planned surgery including the date and location.  How do I manage my blood sugar before surgery? Check your blood sugar at least 4 times a day, starting 2 days before surgery, to make sure that the level is not too high or low. Check your blood sugar the morning of your surgery when you wake up and every 2 hours until you get to the Short Stay unit. If your blood sugar is less than 70 mg/dL, you will need to treat for low blood sugar: Do not take insulin. Treat a low blood sugar (less than 70 mg/dL) with  cup of clear juice (cranberry or apple), 4 glucose tablets, OR glucose gel. Recheck blood sugar in 15 minutes after treatment (to make sure it is greater than 70 mg/dL). If your blood sugar is not greater than 70 mg/dL on recheck, call [phone] for further instructions. Report your blood sugar to the short stay nurse when you get to Short Stay.  If you are admitted to the hospital after surgery: Your blood sugar will  be checked by the staff and you will probably be given insulin after surgery (instead of oral diabetes medicines) to make sure you have good blood sugar levels. The goal for blood sugar control after surgery is 80-180 mg/dL.  WHAT DO I DO ABOUT MY DIABETES MEDICATION?     Hlod jardiance after: 07/22/23   THE MORNING OF SURGERY, Take ONLY half of the lantus insulin dose. DO NOT TAKE ANY ORAL DIABETIC MEDICATIONS DAY OF YOUR SURGERY  DO NOT TAKE THE FOLLOWING 7 DAYS PRIOR TO SURGERY: Ozempic, Wegovy, Rybelsus (Semaglutide), Byetta (exenatide), Bydureon (exenatide ER), Victoza, Saxenda (liraglutide), or Trulicity (dulaglutide) Mounjaro (Tirzepatide) Adlyxin (Lixisenatide), Polyethylene Glycol Loxenatide. Hold mounjaro after: 07/16/23  Bring CPAP  mask and tubing day of surgery.                              You may not have any metal on your body including hair pins, jewelry, and body piercing             Do not wear lotions, powders, perfumes/cologne, or deodorant              Men may shave face and neck.   Do not bring valuables to the hospital. [facility] IS NOT             RESPONSIBLE   FOR VALUABLES.   Contacts, glasses, or bridgework may not be worn into surgery.   Bring small overnight bag day of surgery.   DO NOT BRING YOUR HOME MEDICATIONS TO THE HOSPITAL. PHARMACY WILL DISPENSE MEDICATIONS LISTED ON YOUR MEDICATION LIST TO YOU DURING YOUR ADMISSION IN THE HOSPITAL!    Patients discharged on the day of surgery will not be allowed to drive home.  Someone NEEDS to stay with you for the first 24 hours after anesthesia.   Special Instructions: Bring a copy of your healthcare power of attorney and living will documents         the day of surgery if you haven't scanned them before.              Please read over the following fact sheets you were given: IF YOU HAVE QUESTIONS ABOUT YOUR PRE-OP INSTRUCTIONS PLEASE CALL [phone]    [facility] - Preparing for Surgery Before surgery, you can play an important role.  Because skin is not sterile, your skin needs to be as free of germs as possible.  You can reduce the number of germs on your skin by washing with CHG (chlorahexidine gluconate) soap before surgery.  CHG is an antiseptic cleaner which kills germs and bonds with the skin to continue killing germs even after washing. Please DO NOT use if you have an allergy to CHG or antibacterial soaps.  If your skin becomes reddened/irritated stop using the CHG and inform your nurse when you arrive at Short Stay. Do not shave (including legs and underarms) for at least 48 hours prior to the first CHG shower.  You may shave your face/neck. Please follow these instructions carefully:  1.  Shower with CHG Soap the night before surgery  and the  morning of Surgery.  2.  If you choose to wash your hair, wash your hair first as usual with your  normal  shampoo.  3.  After you shampoo, rinse your hair and body thoroughly to remove the  shampoo.  4.  Use CHG as you would any other liquid soap.  You can apply chg directly  to the skin and wash                       Gently with a scrungie or clean washcloth.  5.  Apply the CHG Soap to your body ONLY FROM THE NECK DOWN.   Do not use on face/ open                           Wound or open sores. Avoid contact with eyes, ears mouth and genitals (private parts).                       Wash face,  Genitals (private parts) with your normal soap.             6.  Wash thoroughly, paying special attention to the area where your surgery  will be performed.  7.  Thoroughly rinse your body with warm water from the neck down.  8.  DO NOT shower/wash with your normal soap after using and rinsing off  the CHG Soap.                9.  Pat yourself dry with a clean towel.            10.  Wear clean pajamas.            11.  Place clean sheets on your bed the night of your first shower and do not  sleep with pets. Day of Surgery : Do not apply any lotions/deodorants the morning of surgery.  Please wear clean clothes to the hospital/surgery center.  FAILURE TO FOLLOW THESE INSTRUCTIONS MAY RESULT IN THE CANCELLATION OF YOUR SURGERY PATIENT SIGNATURE_________________________________  NURSE SIGNATURE__________________________________  ________________________________________________________________________

## 2023-07-15 ENCOUNTER — Encounter (HOSPITAL_COMMUNITY): Payer: Self-pay

## 2023-07-15 ENCOUNTER — Encounter (HOSPITAL_COMMUNITY)
Admission: RE | Admit: 2023-07-15 | Discharge: 2023-07-15 | Disposition: A | Discharge status: Home / Self Care | Payer: BC Managed Care – PPO | Source: Ambulatory Visit | Attending: Urology | Admitting: Urology

## 2023-07-15 ENCOUNTER — Other Ambulatory Visit: Payer: Self-pay

## 2023-07-15 VITALS — BP 165/81 | HR 53 | Temp 98.5°F | Ht 68.0 in | Wt 191.0 lb

## 2023-07-15 DIAGNOSIS — E1122 Type 2 diabetes mellitus with diabetic chronic kidney disease: Secondary | ICD-10-CM | POA: Insufficient documentation

## 2023-07-15 DIAGNOSIS — N183 Chronic kidney disease, stage 3 unspecified: Secondary | ICD-10-CM | POA: Diagnosis not present

## 2023-07-15 DIAGNOSIS — I10 Essential (primary) hypertension: Secondary | ICD-10-CM

## 2023-07-15 DIAGNOSIS — Z01812 Encounter for preprocedural laboratory examination: Secondary | ICD-10-CM | POA: Insufficient documentation

## 2023-07-15 DIAGNOSIS — I129 Hypertensive chronic kidney disease with stage 1 through stage 4 chronic kidney disease, or unspecified chronic kidney disease: Secondary | ICD-10-CM | POA: Diagnosis not present

## 2023-07-15 DIAGNOSIS — Z01818 Encounter for other preprocedural examination: Secondary | ICD-10-CM | POA: Diagnosis present

## 2023-07-15 LAB — CBC
HCT: 35.6 % — ABNORMAL LOW (ref 39.0–52.0)
Hemoglobin: 10.7 g/dL — ABNORMAL LOW (ref 13.0–17.0)
MCH: 27.5 pg (ref 26.0–34.0)
MCHC: 30.1 g/dL (ref 30.0–36.0)
MCV: 91.5 fL (ref 80.0–100.0)
Platelets: 222 10*3/uL (ref 150–400)
RBC: 3.89 MIL/uL — ABNORMAL LOW (ref 4.22–5.81)
RDW: 16.1 % — ABNORMAL HIGH (ref 11.5–15.5)
WBC: 5.1 10*3/uL (ref 4.0–10.5)
nRBC: 0 % (ref 0.0–0.2)

## 2023-07-15 LAB — BASIC METABOLIC PANEL
Anion gap: 8 (ref 5–15)
BUN: 19 mg/dL (ref 8–23)
CO2: 22 mmol/L (ref 22–32)
Calcium: 8.9 mg/dL (ref 8.9–10.3)
Chloride: 109 mmol/L (ref 98–111)
Creatinine, Ser: 1.84 mg/dL — ABNORMAL HIGH (ref 0.61–1.24)
GFR, Estimated: 41 mL/min — ABNORMAL LOW (ref >60)
Glucose, Bld: 163 mg/dL — ABNORMAL HIGH (ref 70–99)
Potassium: 4.8 mmol/L (ref 3.5–5.1)
Sodium: 139 mmol/L (ref 135–145)

## 2023-07-15 LAB — GLUCOSE, CAPILLARY: Glucose-Capillary: 170 mg/dL — ABNORMAL HIGH (ref 70–99)

## 2023-07-15 NOTE — Progress Notes (Addendum)
For Short Stay: COVID SWAB appointment date:  Bowel Prep reminder:   For Anesthesia: PCP - Jamey Reas, MD   Cardiologist - N/A  Chest x-ray - 02/03/23 EKG - 02/04/23 Stress Test -  ECHO - 03/10/23 Cardiac Cath -  Pacemaker/ICD device last checked: Pacemaker orders received: Device Rep notified:  Spinal Cord Stimulator: N/A  Sleep Study - Yes CPAP - Yes  Fasting Blood Sugar - 120's Checks Blood Sugar __1___ times a day Date and result of last Hgb A1c-6.8: 05/06/23  Last dose of GLP1 agonist- Mounjaro on hold since: 07/08/23 GLP1 instructions:   Last dose of SGLT-2 inhibitors- Jardiance: On hold: after 07/22/23 SGLT-2 instructions:   Blood Thinner Instructions: Aspirin Instructions: Will be hold 7 days before. Last Dose:  Activity level: Can go up a flight of stairs and activities of daily living without stopping and without chest pain and/or shortness of breath   Able to exercise without chest pain and/or shortness of breath  Anesthesia review: Hx: CKD III,HTN,DIA.  Patient denies shortness of breath, fever, cough and chest pain at PAT appointment   Patient verbalized understanding of instructions that were given to them at the PAT appointment. Patient was also instructed that they will need to review over the PAT instructions again at home before surgery.

## 2023-07-15 NOTE — Progress Notes (Signed)
Lab. Results: creatinine: 1.84

## 2023-07-21 NOTE — Progress Notes (Signed)
Patient for US guided FNA soft tissue biopsy on Friday 07/22/2023, I called and spoke with the patient on the phone and gave pre-procedure instructions. Pt was made aware to be here at 12:30p and check in at the [facility]. Pt stated understanding.  Called 07/21/2023

## 2023-07-22 ENCOUNTER — Ambulatory Visit
Admission: RE | Admit: 2023-07-22 | Discharge: 2023-07-22 | Disposition: A | Discharge status: Home / Self Care | Payer: BC Managed Care – PPO | Source: Ambulatory Visit | Attending: Otolaryngology | Admitting: Otolaryngology

## 2023-07-22 ENCOUNTER — Other Ambulatory Visit: Payer: Self-pay | Admitting: Otolaryngology

## 2023-07-22 DIAGNOSIS — R221 Localized swelling, mass and lump, neck: Secondary | ICD-10-CM | POA: Diagnosis present

## 2023-07-22 MED ORDER — LIDOCAINE HCL (PF) 1 % IJ SOLN
10.0000 mL | Freq: Once | INTRAMUSCULAR | Status: AC
  Administered 2023-07-22: 10 mL via INTRADERMAL
  Filled 2023-07-22: qty 10

## 2023-07-25 NOTE — Group Note (Deleted)

## 2023-07-25 NOTE — H&P (Signed)
CC/HPI: cc: meatal stenosis, LUTs, spraying of stream   02/04/23: 62 year old man who recently underwent metatoplasty and diagnostic cystoscopy on 01/10/23 by Dr. Retta Diones now returns with continued LUTS of spraying of urinary stream as well as weak urinary stream. He is on Uroxatrol but does not feel that this has made a difference. He is continuing self dilation in the morning to keep meatus open. He has not felt symptoms have improved after surgery. He also has nocturia that is bothersome. He often needs to sit down to pee so that he does not spray all over his clothes.   03/16/2023: Here for diagnostic cystoscopy. Patient stopped self dilating because he feels like it did not make a difference in spraying during urination.   04/20/2023: 62 year old man with weak urinary stream and feeling of incomplete emptying here for follow-up. He had a urodynamic study that showed decreased urinary sensation and increased bladder capacity. He also was not able to generate a voluntary contraction and void. He voids approximately 1-2 times a day. He will have periods where he has increased frequency and have to void several times overnight to empty. It will take him a long time to try and empty his bladder.   UDS SUMMARY  [name] held a max capacity of approx. 1000 mls. His 1st sensation was felt at 792 mls. No instability was noted. He did not generate a voluntary contraction and void. He attempted for several minutes. He tried straiing, using crede method, and rocking his pelvis back and forth but these maneuvers did not help him. Trabeculation was noted. No reflux was seen. His bladder was completely drained before he left the clinic. He will return for UDS follow up.   05/03/2023: 62 year old man with a history of weak urinary stream, feeling of incomplete emptying and mucinosis here for follow-up. Urodynamics study showed an increased bladder capacity of 1 L and decree sensation. Patient is having an increasing we  difficult time emptying his bladder. At his last visit we discussed CIC versus a suprapubic tube. Patient would very much like to proceed with a suprapubic tube.    05/16/2023: 62 year old man with a weak urinary stream and incomplete emptying found to have poorly contracting bladder on UDS here for follow up. He underwent SPT placement by IR last week and now has questions about care. He is not voiding per urethra at all. He does have occasional urgency but is able to suppress it. He is having some burning pain in his right flank and lower abdomen area.   06/27/23: 62 year old man with a history of weak urinary stream, feeling of incomplete emptying, meatal stenosis who underwent suprapubic tube placement by IR here for SP tube change. He has been taking Gemtesa and Detrol for urgency but it is not helping him. He feels like he is sitting on something when he sits down.   07/11/2023: Brian Nielsen is a 62 year old man who presents today for discussion of more comfortable set up of his suprapubic catheter bag. He does not like wearing the catheter bag on his thigh. He states it causes him discomfort. He would like a bag that reaches down to his calf. He has upcoming surgery scheduled on 07/26/2023 for Botox. His catheter was just exchanged 2 weeks ago.     ALLERGIES: Codeine    MEDICATIONS: Detrol La 4 mg capsule, ext release 24 hr 1 capsule PO Daily  Gemtesa 75 mg tablet 1 tablet PO Daily  Lisinopril 20 mg tablet  Myrbetriq 50 mg  tablet, extended release 24 hr 1 tablet PO Daily  Aspirin Ec 81 mg tablet, delayed release  Atorvastatin Calcium 40 mg tablet  Carvedilol 3.125 mg tablet  Clonidine Hcl 0.2 mg tablet  Furosemide 20 mg tablet  Gabapentin 300 mg capsule  Jardiance  Lantus  Mounjaro 10 mg/0.5 ml pen injector  Pantoprazole Sodium 40 mg tablet, delayed release     GU PSH: Compl Change SP Tube - 06/27/2023 Complex cystometrogram, w/ void pressure and urethral pressure profile studies, any  technique - 04/08/2023 Complex Uroflow - 04/08/2023, 03/16/2023 Cystoscopy - 03/16/2023 Emg surf Electrd - 04/08/2023 Inject For cystogram - 04/08/2023 Intrabd voidng Press - 04/08/2023 Simple Change SP Tube - 06/27/2023 Urethral Meatotomy - 01/10/2023     NON-GU PSH: No Non-GU PSH    GU PMH: Incomplete bladder emptying - 06/27/2023, - 05/16/2023, - 05/03/2023, - 04/20/2023, - 04/08/2023, - 03/16/2023, - 02/04/2023 Incontinence w/o Sensation - 06/27/2023, - 06/14/2023 Urinary Retention - 06/27/2023, - 06/14/2023 Urinary Urgency - 06/27/2023 Nocturia - 05/16/2023, - 05/03/2023, - 04/20/2023, - 03/16/2023, - 02/04/2023 Splitting of urinary stream - 05/16/2023, - 03/16/2023, - 02/04/2023 Weak Urinary Stream - 05/16/2023, - 05/03/2023, - 04/20/2023 Kidney Failure Unspec      PMH Notes: acid reflux   NON-GU PMH: Diabetes Type 2 Hypercholesterolemia Hypertension Other specified arthritis, other site Sleep Apnea    FAMILY HISTORY: Diabetes - Father High Blood Pressure - Father   SOCIAL HISTORY: Marital Status: Married Uses smokeless tobacco.    REVIEW OF SYSTEMS:    GU Review Male:   Patient denies frequent urination, hard to postpone urination, burning/ pain with urination, get up at night to urinate, leakage of urine, stream starts and stops, trouble starting your stream, have to strain to urinate , erection problems, and penile pain.  Gastrointestinal (Upper):   Patient denies indigestion/ heartburn, nausea, and vomiting.  Gastrointestinal (Lower):   Patient denies diarrhea and constipation.  Constitutional:   Patient denies fever, night sweats, weight loss, and fatigue.  Skin:   Patient denies skin rash/ lesion and itching.  Ears/ Nose/ Throat:   Patient denies sore throat and sinus problems.  Musculoskeletal:   Patient denies back pain and joint pain.  Neurological:   Patient denies headaches and dizziness.  Psychologic:   Patient denies depression and anxiety.   VITAL SIGNS:      07/11/2023 08:56 AM  Weight  190 lb / 86.18 kg  Height 68 in / 172.72 cm  Pulse 47 /min  Temperature 98.0 F / 36.6 C  BMI 28.9 kg/m   GU PHYSICAL EXAMINATION:      Notes: s/p tube in place. Clear yellow urine drainnig in bag.      Complexity of Data:  Source Of History:  Patient  Records Review:   Previous Doctor Records, Previous Patient Records   PROCEDURES: None   ASSESSMENT:      ICD-10 Details  1 GU:   Urinary Retention - R33.8 Chronic, Stable   PLAN:            Medications New Meds: Other Please supply Foley catheter bags of the patient's choosing that may be worn down near the ankle. He will need 2-4 bags per month. If unable to supply bags with longer tubing, please supply tube extension. please also supply Foley catheter holder 1 to 2/month of the patient's choosing.   #2  PRN Refill(s)  Pharmacy Name:  [facility]  Address:  [address]   [place], [place] [address]  Phone:  [phone]  Fax:  [phone]    Stop Meds: Vitamin B12  Discontinue: 07/11/2023  - Reason: The medication cycle was completed.  Vitamin D3  Discontinue: 07/11/2023  - Reason: The medication cycle was completed.            Document Letter(s):  Created for Patient: Clinical Summary         Notes:   I was able to extend his tubing so that the catheter bag rested around his cath as he requested. I gave him a prescription to [facility] as well as a prescription for Foley catheter straps. I advised that sometimes it takes time to figure out best set up a most comfortable way to wear a suprapubic catheter but eventually he will find the correct set up. Keep upcoming surgery as scheduled on 07/26/2023.

## 2023-07-26 ENCOUNTER — Encounter (HOSPITAL_COMMUNITY): Payer: Self-pay | Admitting: Urology

## 2023-07-26 ENCOUNTER — Encounter (HOSPITAL_COMMUNITY)
Admission: RE | Disposition: A | Discharge status: Home / Self Care | Payer: Self-pay | Source: Ambulatory Visit | Attending: Urology

## 2023-07-26 ENCOUNTER — Ambulatory Visit (HOSPITAL_COMMUNITY): Payer: BC Managed Care – PPO

## 2023-07-26 ENCOUNTER — Ambulatory Visit (HOSPITAL_COMMUNITY): Payer: BC Managed Care – PPO | Admitting: Registered Nurse

## 2023-07-26 ENCOUNTER — Ambulatory Visit (HOSPITAL_COMMUNITY)
Admission: RE | Admit: 2023-07-26 | Discharge: 2023-07-26 | Disposition: A | Discharge status: Home / Self Care | Payer: BC Managed Care – PPO | Source: Ambulatory Visit | Attending: Urology | Admitting: Urology

## 2023-07-26 DIAGNOSIS — Z7985 Long-term (current) use of injectable non-insulin antidiabetic drugs: Secondary | ICD-10-CM | POA: Insufficient documentation

## 2023-07-26 DIAGNOSIS — Z8249 Family history of ischemic heart disease and other diseases of the circulatory system: Secondary | ICD-10-CM | POA: Diagnosis not present

## 2023-07-26 DIAGNOSIS — G4730 Sleep apnea, unspecified: Secondary | ICD-10-CM | POA: Insufficient documentation

## 2023-07-26 DIAGNOSIS — Z7984 Long term (current) use of oral hypoglycemic drugs: Secondary | ICD-10-CM | POA: Diagnosis not present

## 2023-07-26 DIAGNOSIS — N319 Neuromuscular dysfunction of bladder, unspecified: Secondary | ICD-10-CM | POA: Diagnosis not present

## 2023-07-26 DIAGNOSIS — R338 Other retention of urine: Secondary | ICD-10-CM | POA: Insufficient documentation

## 2023-07-26 DIAGNOSIS — I10 Essential (primary) hypertension: Secondary | ICD-10-CM | POA: Insufficient documentation

## 2023-07-26 DIAGNOSIS — M1990 Unspecified osteoarthritis, unspecified site: Secondary | ICD-10-CM | POA: Insufficient documentation

## 2023-07-26 DIAGNOSIS — E119 Type 2 diabetes mellitus without complications: Secondary | ICD-10-CM | POA: Diagnosis not present

## 2023-07-26 DIAGNOSIS — Z79899 Other long term (current) drug therapy: Secondary | ICD-10-CM | POA: Insufficient documentation

## 2023-07-26 DIAGNOSIS — R3915 Urgency of urination: Secondary | ICD-10-CM | POA: Diagnosis not present

## 2023-07-26 HISTORY — PX: CYSTOSCOPY WITH INJECTION: SHX1424

## 2023-07-26 LAB — GLUCOSE, CAPILLARY
Glucose-Capillary: 243 mg/dL — ABNORMAL HIGH (ref 70–99)
Glucose-Capillary: 146 mg/dL — ABNORMAL HIGH (ref 70–99)

## 2023-07-26 SURGERY — CYSTOSCOPY, WITH INJECTION OF BLADDER NECK OR BLADDER WALL
Anesthesia: General

## 2023-07-26 MED ORDER — FENTANYL CITRATE (PF) 100 MCG/2ML IJ SOLN
INTRAMUSCULAR | Status: DC | PRN
  Administered 2023-07-26: 25 ug via INTRAVENOUS
  Administered 2023-07-26: 50 ug via INTRAVENOUS
  Administered 2023-07-26: 25 ug via INTRAVENOUS

## 2023-07-26 MED ORDER — FENTANYL CITRATE PF 50 MCG/ML IJ SOSY
25.0000 ug | PREFILLED_SYRINGE | INTRAMUSCULAR | Status: DC | PRN
  Administered 2023-07-26: 25 ug via INTRAVENOUS

## 2023-07-26 MED ORDER — DEXAMETHASONE SODIUM PHOSPHATE 10 MG/ML IJ SOLN
INTRAMUSCULAR | Status: AC
  Filled 2023-07-26: qty 1

## 2023-07-26 MED ORDER — DEXAMETHASONE SODIUM PHOSPHATE 10 MG/ML IJ SOLN
INTRAMUSCULAR | Status: DC | PRN
  Administered 2023-07-26: 5 mg via INTRAVENOUS

## 2023-07-26 MED ORDER — EPHEDRINE 5 MG/ML INJ
INTRAVENOUS | Status: AC
  Filled 2023-07-26: qty 5

## 2023-07-26 MED ORDER — HYDRALAZINE HCL 20 MG/ML IJ SOLN
INTRAMUSCULAR | Status: AC
  Filled 2023-07-26: qty 1

## 2023-07-26 MED ORDER — ONDANSETRON HCL 4 MG/2ML IJ SOLN
INTRAMUSCULAR | Status: AC
  Filled 2023-07-26: qty 2

## 2023-07-26 MED ORDER — STERILE WATER FOR IRRIGATION IR SOLN
Status: DC | PRN
  Administered 2023-07-26: 1500 mL

## 2023-07-26 MED ORDER — PROPOFOL 10 MG/ML IV BOLUS
INTRAVENOUS | Status: AC
  Filled 2023-07-26: qty 20

## 2023-07-26 MED ORDER — SODIUM CHLORIDE (PF) 0.9 % IJ SOLN
INTRAMUSCULAR | Status: AC
  Filled 2023-07-26: qty 20

## 2023-07-26 MED ORDER — INSULIN ASPART 100 UNIT/ML IJ SOLN
0.0000 [IU] | INTRAMUSCULAR | Status: DC | PRN
  Administered 2023-07-26: 6 [IU] via SUBCUTANEOUS
  Filled 2023-07-26: qty 1

## 2023-07-26 MED ORDER — ONABOTULINUMTOXINA 100 UNITS IJ SOLR
INTRAMUSCULAR | Status: DC | PRN
  Administered 2023-07-26: 100 [IU] via INTRAMUSCULAR

## 2023-07-26 MED ORDER — ONDANSETRON HCL 4 MG/2ML IJ SOLN
INTRAMUSCULAR | Status: DC | PRN
  Administered 2023-07-26: 4 mg via INTRAVENOUS

## 2023-07-26 MED ORDER — HYDRALAZINE HCL 20 MG/ML IJ SOLN
10.0000 mg | Freq: Once | INTRAMUSCULAR | Status: AC
  Administered 2023-07-26: 10 mg via INTRAVENOUS

## 2023-07-26 MED ORDER — ONABOTULINUMTOXINA 100 UNITS IJ SOLR
INTRAMUSCULAR | Status: AC
  Filled 2023-07-26: qty 100

## 2023-07-26 MED ORDER — LIDOCAINE HCL (PF) 2 % IJ SOLN
INTRAMUSCULAR | Status: AC
  Filled 2023-07-26: qty 5

## 2023-07-26 MED ORDER — LIDOCAINE 2% (20 MG/ML) 5 ML SYRINGE
INTRAMUSCULAR | Status: DC | PRN
  Administered 2023-07-26: 60 mg via INTRAVENOUS

## 2023-07-26 MED ORDER — ORAL CARE MOUTH RINSE: 15.0000 mL | Freq: Once | OROMUCOSAL | Status: AC

## 2023-07-26 MED ORDER — CEFAZOLIN SODIUM-DEXTROSE 2-4 GM/100ML-% IV SOLN
2.0000 g | INTRAVENOUS | Status: AC
  Administered 2023-07-26: 2 g via INTRAVENOUS
  Filled 2023-07-26: qty 100

## 2023-07-26 MED ORDER — ACETAMINOPHEN 10 MG/ML IV SOLN
INTRAVENOUS | Status: AC
  Filled 2023-07-26: qty 100

## 2023-07-26 MED ORDER — FENTANYL CITRATE PF 50 MCG/ML IJ SOSY
PREFILLED_SYRINGE | INTRAMUSCULAR | Status: AC
  Filled 2023-07-26: qty 1

## 2023-07-26 MED ORDER — MIDAZOLAM HCL 5 MG/5ML IJ SOLN
INTRAMUSCULAR | Status: DC | PRN
  Administered 2023-07-26: 2 mg via INTRAVENOUS

## 2023-07-26 MED ORDER — LACTATED RINGERS IV SOLN: INTRAVENOUS | Status: DC

## 2023-07-26 MED ORDER — DROPERIDOL 2.5 MG/ML IJ SOLN: 0.6250 mg | Freq: Once | INTRAMUSCULAR | Status: DC | PRN

## 2023-07-26 MED ORDER — CHLORHEXIDINE GLUCONATE 0.12 % MT SOLN
15.0000 mL | Freq: Once | OROMUCOSAL | Status: AC
  Administered 2023-07-26: 15 mL via OROMUCOSAL

## 2023-07-26 MED ORDER — FENTANYL CITRATE (PF) 100 MCG/2ML IJ SOLN
INTRAMUSCULAR | Status: AC
  Filled 2023-07-26: qty 2

## 2023-07-26 MED ORDER — SODIUM CHLORIDE (PF) 0.9 % IJ SOLN
INTRAMUSCULAR | Status: DC | PRN
  Administered 2023-07-26: 20 mL via INTRAVENOUS

## 2023-07-26 MED ORDER — PROPOFOL 10 MG/ML IV BOLUS
INTRAVENOUS | Status: DC | PRN
  Administered 2023-07-26: 200 mg via INTRAVENOUS

## 2023-07-26 MED ORDER — MIDAZOLAM HCL 2 MG/2ML IJ SOLN
INTRAMUSCULAR | Status: AC
  Filled 2023-07-26: qty 2

## 2023-07-26 MED ORDER — ACETAMINOPHEN 10 MG/ML IV SOLN
1000.0000 mg | Freq: Once | INTRAVENOUS | Status: DC | PRN
  Administered 2023-07-26: 1000 mg via INTRAVENOUS

## 2023-07-26 MED ORDER — EPHEDRINE SULFATE-NACL 50-0.9 MG/10ML-% IV SOSY
PREFILLED_SYRINGE | INTRAVENOUS | Status: DC | PRN
  Administered 2023-07-26: 5 mg via INTRAVENOUS

## 2023-07-26 SURGICAL SUPPLY — 31 items
BAG DRN RND TRDRP ANRFLXCHMBR (UROLOGICAL SUPPLIES) ×1
BAG URINE DRAIN 2000ML AR STRL (UROLOGICAL SUPPLIES) ×1 IMPLANT
BAG URO CATCHER STRL LF (MISCELLANEOUS) ×1 IMPLANT
BALLN NEPHROSTOMY (BALLOONS)
BALLOON NEPHROSTOMY (BALLOONS) IMPLANT
CATH FOLEY 2W COUNCIL 20FR 5CC (CATHETERS) IMPLANT
CATH FOLEY 2WAY SLVR 5CC 16FR (CATHETERS) IMPLANT
CATH FOLEY 2WAY SLVR 5CC 18FR (CATHETERS) IMPLANT
CATH ROBINSON RED A/P 14FR (CATHETERS) IMPLANT
CATH URET 5FR 70CM CONE TIP (BALLOONS) IMPLANT
CATH URETL OPEN END 6FR 70 (CATHETERS) IMPLANT
CLOTH BEACON ORANGE TIMEOUT ST (SAFETY) ×1 IMPLANT
ELECT REM PT RETURN 15FT ADLT (MISCELLANEOUS) ×1 IMPLANT
GLOVE BIO SURGEON STRL SZ 6.5 (GLOVE) ×1 IMPLANT
GOWN STRL REUS W/ TWL LRG LVL3 (GOWN DISPOSABLE) ×1 IMPLANT
GOWN STRL REUS W/TWL LRG LVL3 (GOWN DISPOSABLE) ×1
GUIDEWIRE ANG ZIPWIRE 038X150 (WIRE) IMPLANT
GUIDEWIRE STR DUAL SENSOR (WIRE) IMPLANT
KIT TURNOVER KIT A (KITS) ×1 IMPLANT
MANIFOLD NEPTUNE II (INSTRUMENTS) ×1 IMPLANT
NDL ASPIRATION 22 (NEEDLE) ×1 IMPLANT
NDL SAFETY ECLIP 18X1.5 (MISCELLANEOUS) ×1 IMPLANT
NEEDLE ASPIRATION 22 (NEEDLE) ×1
NS IRRIG 1000ML POUR BTL (IV SOLUTION) IMPLANT
PACK CYSTO (CUSTOM PROCEDURE TRAY) ×1 IMPLANT
SPONGE DRAIN TRACH 4X4 STRL 2S (GAUZE/BANDAGES/DRESSINGS) IMPLANT
SYR 20ML LL LF (SYRINGE) ×1 IMPLANT
SYR CONTROL 10ML LL (SYRINGE) ×1 IMPLANT
TUBING CONNECTING 10 (TUBING) ×1 IMPLANT
TUBING UROLOGY SET (TUBING) ×1 IMPLANT
WATER STERILE IRR 3000ML UROMA (IV SOLUTION) ×1 IMPLANT

## 2023-07-26 NOTE — Anesthesia Preprocedure Evaluation (Addendum)
Anesthesia Evaluation  Patient identified by MRN, date of birth, ID band Patient awake    Reviewed: Allergy & Precautions, NPO status , Patient's Chart, lab work & pertinent test results  Airway Mallampati: II  TM Distance: >3 FB Neck ROM: Full    Dental no notable dental hx.    Pulmonary sleep apnea    Pulmonary exam normal        Cardiovascular hypertension, Pt. on medications and Pt. on home beta blockers Normal cardiovascular exam Rhythm:Regular Rate:Normal  IMPRESSIONS  1. Left ventricular ejection fraction, by estimation, is 60 to 65%. The left ventricle has normal function. The left ventricle has no regional wall motion abnormalities. Left ventricular diastolic parameters were normal.  2. Right ventricular systolic function is normal. The right ventricular size is normal. Tricuspid regurgitation signal is inadequate for assessing PA pressure.  3. The mitral valve is normal in structure. Trivial mitral valve regurgitation.  4. The aortic valve is grossly normal. Aortic valve regurgitation is not visualized.  5. The inferior vena cava is dilated in size with >50% respiratory variability, suggesting right atrial pressure of 8 mmHg.    Neuro/Psych negative neurological ROS  negative psych ROS   GI/Hepatic Neg liver ROS,GERD  Medicated,,  Endo/Other  diabetes, Type 2, Insulin Dependent    Renal/GU  Bladder dysfunction      Musculoskeletal  (+) Arthritis , Osteoarthritis,    Abdominal Normal abdominal exam  (+)   Peds  Hematology  (+) Blood dyscrasia, anemia Lab Results      Component                Value               Date                      WBC                      5.1                 07/15/2023                HGB                      10.7 (L)            07/15/2023                HCT                      35.6 (L)            07/15/2023                MCV                      91.5                07/15/2023                 PLT                      222                 07/15/2023              Anesthesia Other Findings   Reproductive/Obstetrics  Anesthesia Physical Anesthesia Plan  ASA: 3  Anesthesia Plan: General   Post-op Pain Management:    Induction: Intravenous  PONV Risk Score and Plan: 2 and Ondansetron, Dexamethasone, Midazolam and Treatment may vary due to age or medical condition  Airway Management Planned: Mask and LMA  Additional Equipment: None  Intra-op Plan:   Post-operative Plan: Extubation in OR  Informed Consent: I have reviewed the patients History and Physical, chart, labs and discussed the procedure including the risks, benefits and alternatives for the proposed anesthesia with the patient or authorized representative who has indicated his/her understanding and acceptance.     Dental advisory given  Plan Discussed with: CRNA  Anesthesia Plan Comments:        Anesthesia Quick Evaluation

## 2023-07-26 NOTE — Interval H&P Note (Signed)
History and Physical Interval Note:  07/26/2023 10:54 AM  Brian Nielsen  has presented today for surgery, with the diagnosis of URGENCY.  The various methods of treatment have been discussed with the patient and family. After consideration of risks, benefits and other options for treatment, the patient has consented to  Procedure(s) with comments: CYSTOSCOPY WITH BOTOX INJECTION 100 UNITS (N/A) - 30 minutes CYSTOSCOPY WITH URETHRAL DILATATION (N/A) as a surgical intervention.  The patient's history has been reviewed, patient examined, no change in status, stable for surgery.  I have reviewed the patient's chart and labs.  Questions were answered to the patient's satisfaction.     Brian Nielsen

## 2023-07-26 NOTE — Anesthesia Postprocedure Evaluation (Signed)
Anesthesia Post Note  Patient: Brian Nielsen  Procedure(s) Performed: CYSTOSCOPY WITH BOTOX INJECTION 100 UNITS     Patient location during evaluation: PACU Anesthesia Type: General Level of consciousness: awake and alert Pain management: pain level controlled Vital Signs Assessment: post-procedure vital signs reviewed and stable Respiratory status: spontaneous breathing, nonlabored ventilation, respiratory function stable and patient connected to nasal cannula oxygen Cardiovascular status: blood pressure returned to baseline and stable Postop Assessment: no apparent nausea or vomiting Anesthetic complications: no   No notable events documented.  Last Vitals:  Vitals:   07/26/23 1330 07/26/23 1345  BP: (!) 154/68 (!) 166/75  Pulse: (!) 53 (!) 57  Resp: 14   Temp:  36.5 C  SpO2: 100% 99%    Last Pain:  Vitals:   07/26/23 1345  TempSrc:   PainSc: 2                  Earl Lites P Morayo Leven

## 2023-07-26 NOTE — Op Note (Signed)
Operative Note  Preoperative diagnosis:  1.  Urinary retention 2.  Urinary urgency  Postoperative diagnosis: 1.  Urinary retention 2.  Urinary urgency  Procedure(s): 1.  Cystoscopy 2.  100unit of botox injection 3.  Complicated suprapubic tube change  Surgeon: Kasandra Knudsen, MD  Assistants:  None  Anesthesia:  General  Complications:  None  EBL:  minmal  Specimens: 1. none  Drains/Catheters: 1.  16Fr SPT  Intraoperative findings:   Normal urethra - no stricture seen Bilateral orthotoipc Uos SPT site at dome with mild surrounding edema consistent with SPT No bladder masses  Indication:  Brian Nielsen is a 62 y.o. male with weak urinary stream found to have an acontractile bladder currently managed with suprapubic tube.  He is having significant urgency with a suprapubic tube in place and is now here for Botox injections in the bladder.  Description of procedure:  After risks and benefits of the procedure were discussed with the patient, informed consent was obtained.  The patient was taken to the operating room placed in the supine position.  Anesthesia was induced antibiotics were administered.  Patient was then repositioned in dorsolithotomy position.  His suprapubic tube was removed prior to prepping and draping.  He was prepped and draped in usual sterile fashion time was performed.  I wire was advanced through the suprapubic tube tract into the bladder.  From below a 21 French rigid cystoscope was placed in the urethral meatus and advanced into the bladder under direct visualization.  The wire was seen entering the bladder from the SP tube site.  Next a 16 French Foley catheter was placed over the wire and advanced into the bladder under direct visualization.  The wire was removed and the Foley was inflated with 10 cc sterile water.    Next diagnostic cystoscopy took place.  Patient had bilateral orthotopic ureteral orifices.  Bladder mucosa appeared normal with the  exception of some edema surrounding the SP tube which is consistent with an indwelling catheter.  The cystoscope was removed and the injection scope was then advanced into the bladder.  100 units of Botox in 20 cc of sterile saline was then injected in standard template over the back wall the bladder urgent care to avoid the ureteral orifices.  2 injection sites were given in the mid trigone area.  The cystoscope was removed.  The SP tube was placed to gravity drainage.  The patient emerged from anesthesia and transferred PACU in stable condition.   Plan:  Discharge home

## 2023-07-26 NOTE — Transfer of Care (Signed)
Immediate Anesthesia Transfer of Care Note  Patient: Brian Nielsen  Procedure(s) Performed: CYSTOSCOPY WITH BOTOX INJECTION 100 UNITS  Patient Location: PACU  Anesthesia Type:General  Level of Consciousness: awake, alert , oriented, and patient cooperative  Airway & Oxygen Therapy: Patient Spontanous Breathing and Patient connected to face mask oxygen  Post-op Assessment: Report given to RN, Post -op Vital signs reviewed and stable, and Patient moving all extremities  Post vital signs: Reviewed and stable  Last Vitals:  Vitals Value Taken Time  BP 163/77 07/26/23 1210  Temp    Pulse 51 07/26/23 1212  Resp 12 07/26/23 1212  SpO2 100 % 07/26/23 1212  Vitals shown include unfiled device data.  Last Pain:  Vitals:   07/26/23 1027  TempSrc: Oral  PainSc: 0-No pain         Complications: No notable events documented.

## 2023-07-26 NOTE — Discharge Instructions (Addendum)
Cystoscopy with Botox injection patient instructions and Suprapubic tube change  Following a cystoscopy, a catheter (a flexible rubber tube) is sometimes left in place to empty the bladder. This may cause some discomfort or a feeling that you need to urinate. Your doctor determines the period of time that the catheter will be left in place. You may have bloody urine for two to three days (Call your doctor if the amount of bleeding increases or does not subside).  You may pass blood clots in your urine, especially if you had a biopsy. It is not unusual to pass small blood clots and have some bloody urine a couple of weeks after your cystoscopy. Again, call your doctor if the bleeding does not subside. You may have: Dysuria (painful urination) Frequency (urinating often) Urgency (strong desire to urinate)  These symptoms are common especially if medicine is instilled into the bladder or a ureteral stent is placed. Avoiding alcohol and caffeine, such as coffee, tea, and chocolate, may help relieve these symptoms. Drink plenty of water, unless otherwise instructed. Your doctor may also prescribe an antibiotic or other medicine to reduce these symptoms.  Cystoscopy results are available soon after the procedure; biopsy results usually take two to four days. Your doctor will discuss the results of your exam with you. Before you go home, you will be given specific instructions for follow-up care. Special Instructions:   If you are going home with a catheter in place do not take a tub bath until removed by your doctor.   You may resume your normal activities.   Do not drive or operate machinery if you are taking narcotic pain medicine.   Be sure to keep all follow-up appointments with your doctor.   Call Your Doctor If: The catheter is not draining You have severe pain You are unable to urinate You have a fever over 101 You have severe bleeding

## 2023-07-26 NOTE — Anesthesia Procedure Notes (Signed)
Procedure Name: LMA Insertion Date/Time: 07/26/2023 11:34 AM  Performed by: Elisabeth Cara, CRNAPre-anesthesia Checklist: Patient identified, Emergency Drugs available, Suction available, Patient being monitored and Timeout performed Patient Re-evaluated:Patient Re-evaluated prior to induction Preoxygenation: Pre-oxygenation with 100% oxygen Induction Type: IV induction LMA: LMA with gastric port inserted LMA Size: 4.0 Number of attempts: 1 Placement Confirmation: positive ETCO2 and breath sounds checked- equal and bilateral Tube secured with: Tape Dental Injury: Teeth and Oropharynx as per pre-operative assessment

## 2023-07-27 ENCOUNTER — Encounter (HOSPITAL_COMMUNITY): Payer: Self-pay | Admitting: Urology

## 2023-08-01 ENCOUNTER — Other Ambulatory Visit: Payer: Self-pay | Admitting: Otolaryngology

## 2023-08-01 DIAGNOSIS — R221 Localized swelling, mass and lump, neck: Secondary | ICD-10-CM

## 2023-08-05 ENCOUNTER — Inpatient Hospital Stay: Payer: BC Managed Care – PPO | Attending: Internal Medicine

## 2023-08-05 ENCOUNTER — Other Ambulatory Visit: Payer: BC Managed Care – PPO

## 2023-08-05 ENCOUNTER — Ambulatory Visit: Payer: BC Managed Care – PPO

## 2023-08-05 ENCOUNTER — Inpatient Hospital Stay (HOSPITAL_BASED_OUTPATIENT_CLINIC_OR_DEPARTMENT_OTHER): Payer: BC Managed Care – PPO | Admitting: Nurse Practitioner

## 2023-08-05 ENCOUNTER — Inpatient Hospital Stay: Payer: BC Managed Care – PPO

## 2023-08-05 ENCOUNTER — Encounter: Payer: Self-pay | Admitting: Nurse Practitioner

## 2023-08-05 ENCOUNTER — Ambulatory Visit: Payer: BC Managed Care – PPO | Admitting: Internal Medicine

## 2023-08-05 VITALS — BP 149/73 | HR 64 | Temp 97.1°F | Wt 203.0 lb

## 2023-08-05 DIAGNOSIS — D649 Anemia, unspecified: Secondary | ICD-10-CM | POA: Insufficient documentation

## 2023-08-05 DIAGNOSIS — D509 Iron deficiency anemia, unspecified: Secondary | ICD-10-CM | POA: Diagnosis not present

## 2023-08-05 DIAGNOSIS — D631 Anemia in chronic kidney disease: Secondary | ICD-10-CM | POA: Diagnosis not present

## 2023-08-05 DIAGNOSIS — N1832 Chronic kidney disease, stage 3b: Secondary | ICD-10-CM | POA: Diagnosis not present

## 2023-08-05 LAB — IRON AND TIBC
Iron: 65 ug/dL (ref 45–182)
Saturation Ratios: 24 % (ref 17.9–39.5)
TIBC: 270 ug/dL (ref 250–450)
UIBC: 205 ug/dL

## 2023-08-05 LAB — BASIC METABOLIC PANEL
Anion gap: 8 (ref 5–15)
BUN: 35 mg/dL — ABNORMAL HIGH (ref 8–23)
CO2: 23 mmol/L (ref 22–32)
Calcium: 8.3 mg/dL — ABNORMAL LOW (ref 8.9–10.3)
Chloride: 107 mmol/L (ref 98–111)
Creatinine, Ser: 2.46 mg/dL — ABNORMAL HIGH (ref 0.61–1.24)
GFR, Estimated: 29 mL/min — ABNORMAL LOW (ref >60)
Glucose, Bld: 145 mg/dL — ABNORMAL HIGH (ref 70–99)
Potassium: 3.9 mmol/L (ref 3.5–5.1)
Sodium: 138 mmol/L (ref 135–145)

## 2023-08-05 LAB — CBC WITH DIFFERENTIAL (CANCER CENTER ONLY)
Abs Immature Granulocytes: 0.02 10*3/uL (ref 0.00–0.07)
Basophils Absolute: 0 10*3/uL (ref 0.0–0.1)
Basophils Relative: 1 %
Eosinophils Absolute: 0.3 10*3/uL (ref 0.0–0.5)
Eosinophils Relative: 6 %
HCT: 29.7 % — ABNORMAL LOW (ref 39.0–52.0)
Hemoglobin: 9.2 g/dL — ABNORMAL LOW (ref 13.0–17.0)
Immature Granulocytes: 0 %
Lymphocytes Relative: 14 %
Lymphs Abs: 0.7 10*3/uL (ref 0.7–4.0)
MCH: 28.2 pg (ref 26.0–34.0)
MCHC: 31 g/dL (ref 30.0–36.0)
MCV: 91.1 fL (ref 80.0–100.0)
Monocytes Absolute: 0.5 10*3/uL (ref 0.1–1.0)
Monocytes Relative: 10 %
Neutro Abs: 3.7 10*3/uL (ref 1.7–7.7)
Neutrophils Relative %: 69 %
Platelet Count: 211 10*3/uL (ref 150–400)
RBC: 3.26 MIL/uL — ABNORMAL LOW (ref 4.22–5.81)
RDW: 15 % (ref 11.5–15.5)
WBC Count: 5.3 10*3/uL (ref 4.0–10.5)
nRBC: 0 % (ref 0.0–0.2)

## 2023-08-05 LAB — FERRITIN: Ferritin: 362 ng/mL — ABNORMAL HIGH (ref 24–336)

## 2023-08-05 MED ORDER — EPOETIN ALFA-EPBX 20000 UNIT/ML IJ SOLN
20000.0000 [IU] | Freq: Once | INTRAMUSCULAR | Status: AC
  Administered 2023-08-05: 20000 [IU] via SUBCUTANEOUS
  Filled 2023-08-05: qty 1

## 2023-08-05 NOTE — Progress Notes (Signed)
[facility] CONSULT NOTE  Patient Care Team: Patient, [name] as PCP - General (General Practice) Otelia Sergeant, NP (Inactive) as Nurse Practitioner (Endocrinology) Earna Coder, MD as Consulting Physician (Oncology)  CHIEF COMPLAINTS/PURPOSE OF CONSULTATION: ANEMIA  HEMATOLOGY HISTORY:  # ANEMIA PROGRESSIVE since 2020 12; 2022- 10 MCV- 90s; colonoscopy 2015; multiple endoscopies-last September 2021-biopsy active celiac disease; MAY 2024- Bone marrow biopsy/aspiration- The bone marrow is generally normocellular for age with trilineage  hematopoiesis and nonspecific changes, likely secondary in nature in  this setting.  There is no morphologic evidence of a lymphoproliferative  process or plasma cell neoplasm.   #CKD stage III [[facility]]/diabetes [[facility]]; ? Celaic disease [[facility]];    - Labs: 07/2014 - tTG IgA 101 03/2020 - positive endomysial Ab IgA, tTG IgA 78 - CSY: 07/2014 - normal appearing terminal ileum, sigmoid diverticulosis, and non-bleeding internal hemorrhoids - EGD: 07/2014 - mild non-erosive gastritis, normal appearing duodenal mucosa with duodenal biopsies showing celiac sprue - EGD: 06/03/2020 - procedure aborted due to presence of food - EGD: 08/12/2020 - irregular Z-line found at [facility] with bx showing mild reflux esophagitis, 1 cm hiatal hernia, mild chronic gastritis, decreased folds found in entire duodenum, flattening found in entire duodenum and scalloped mucosa with bx showing active Celiac disease with villous atrophy, increased intraepithelial lymphocytes, etc. Repeat in 1-year.  HISTORY OF PRESENTING ILLNESS: Alone.  Ambulating independently.  Brian Nielsen 62 y.o. male with symptomatic anemia secondary to CKD and comorbidities who returns to clinic for follow up and consideration of venofer or retacrit. He has appointment Monday for bilateral eye surgery. Also has to have 'knot' on right jaw removed at [facility] upcoming. Continues  to have bladder cath/bag.    Review of Systems  Constitutional:  Positive for malaise/fatigue. Negative for chills, diaphoresis, fever and weight loss.  HENT:  Negative for nosebleeds and sore throat.   Eyes:  Negative for double vision.  Respiratory:  Positive for shortness of breath. Negative for cough, hemoptysis, sputum production and wheezing.   Cardiovascular:  Negative for chest pain, palpitations, orthopnea and leg swelling.  Gastrointestinal:  Negative for blood in stool, heartburn, melena, nausea and vomiting.  Genitourinary:  Negative for dysuria, frequency, hematuria and urgency.  Musculoskeletal:  Positive for back pain and joint pain. Negative for falls.  Skin: Negative.  Negative for itching and rash.  Neurological:  Positive for weakness. Negative for dizziness, tingling, focal weakness and headaches.  Endo/Heme/Allergies:  Does not bruise/bleed easily.  Psychiatric/Behavioral:  Negative for depression. The patient is not nervous/anxious and does not have insomnia.     MEDICAL HISTORY:  Past Medical History:  Diagnosis Date   Adult celiac disease 2015   followed by dr Servando Snare (GI)   Anemia associated with chronic renal failure    hematologist--- dr Sarajane Jews;  treated with iron infusions   Benign localized prostatic hyperplasia with lower urinary tract symptoms (LUTS)    urologist--- dr Retta Diones   Charcot foot due to diabetes mellitus (HCC)    Chronic constipation    CKD (chronic kidney disease), stage III [facility])    nephrologist--- dr Wolfgang Phoenix;  renal bx 11-20-2021   Diverticulosis of colon    Edema of both lower extremities    GERD (gastroesophageal reflux disease)    History of adenomatous polyp of colon    HTN (hypertension)    Hyperlipidemia, mixed    Peripheral neuropathy    Peyronie's disease    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 meatal stricture  Retinopathy due to secondary diabetes mellitus (HCC)    both eyes  treated with injecitons   Thoracic spondylosis  without myelopathy 11/22/2016   Type 2 diabetes mellitus [facility])    endocrinologist--- dr nida   Vitamin D deficiency    Wears hearing aid in both ears     SURGICAL HISTORY: Past Surgical History:  Procedure Laterality Date   BIOPSY N/A 09/07/2021   Procedure: BIOPSY;  Surgeon: Midge Minium, MD;  Location: [facility];  Service: Endoscopy;  Laterality: N/A;   CATARACT EXTRACTION W/ INTRAOCULAR LENS IMPLANT Bilateral 2023   COLONOSCOPY N/A 05/03/2014   Procedure: COLONOSCOPY;  Surgeon: West Bali, MD;  Location: [facility];  Service: Endoscopy;  Laterality: N/A;  12:00   COLONOSCOPY WITH PROPOFOL N/A 09/07/2021   Procedure: COLONOSCOPY WITH PROPOFOL;  Surgeon: Midge Minium, MD;  Location: [facility];  Service: Endoscopy;  Laterality: N/A;   CYSTOSCOPY WITH INJECTION N/A 07/26/2023   Procedure: CYSTOSCOPY WITH BOTOX INJECTION 100 UNITS;  Surgeon: Noel Christmas, MD;  Location: [facility];  Service: Urology;  Laterality: N/A;  30 minutes   CYSTOSCOPY WITH URETHRAL DILATATION N/A 01/10/2023   Procedure: CYSTOSCOPY;  Surgeon: Marcine Matar, MD;  Location: [facility];  Service: Urology;  Laterality: N/A;   ESOPHAGOGASTRODUODENOSCOPY N/A 05/03/2014   Procedure: ESOPHAGOGASTRODUODENOSCOPY (EGD);  Surgeon: West Bali, MD;  [facility];  Service: Endoscopy;  Laterality: N/A;   ESOPHAGOGASTRODUODENOSCOPY (EGD) WITH PROPOFOL N/A 06/03/2020   Procedure: ESOPHAGOGASTRODUODENOSCOPY (EGD) WITH PROPOFOL;  Surgeon: Regis Bill, MD;  Location: [facility] END[facility];  Service: Endoscopy;  Laterality: N/A;   ESOPHAGOGASTRODUODENOSCOPY (EGD) WITH PROPOFOL N/A 09/07/2021   Procedure: ESOPHAGOGASTRODUODENOSCOPY (EGD) WITH PROPOFOL;  Surgeon: Midge Minium, MD;  Location: [facility];  Service: Endoscopy;  Laterality: N/A;  Diabetic   IR BONE MARROW BIOPSY & ASPIRATION  04/27/2023   MEATOTOMY N/A 01/10/2023   Procedure: MEATOTOMY ADULT;  Surgeon:  Marcine Matar, MD;  Location: [facility];  Service: Urology;  Laterality: N/A;   PARS PLANA VITRECTOMY Right 03/21/2021   Procedure: PARS PLANA VITRECTOMY 25 GAUGE WITH INJECTION OF ANTIBIOTICS FOR ENDOPHTHALMITIS;  Surgeon: Carmela Rima, MD;  Location: [facility] OR;  Service: Ophthalmology;  Laterality: Right;   POLYPECTOMY N/A 09/07/2021   Procedure: POLYPECTOMY;  Surgeon: Midge Minium, MD;  Location: [facility];  Service: Endoscopy;  Laterality: N/A;   REPAIR EXTENSOR TENDON Right 08/11/2022   Procedure: EXTENSOR CARPI ULNARIS TENDON DEBRIDEMENT, POSSIBLE SUBSHEATH RECONSTRUCTION;  Surgeon: Marlyne Beards, MD;  Location: [facility];  Service: Orthopedics;  Laterality: Right;  or regional plus MAC    SOCIAL HISTORY: Social History   Socioeconomic History   Marital status: Married    Spouse name: [name]   Number of children: 1   Years of education: 12   Highest education level: Not on file  Occupational History   Occupation: Architect: [employer]    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  Tobacco Use   Smoking status: Never   Smokeless tobacco: Current    Types: Snuff  Vaping Use   Vaping status: Never Used  Substance and Sexual Activity   Alcohol use: Not Currently    Comment: seldom   Drug use: Never   Sexual activity: Yes    Birth control/protection: Surgical  Other Topics Concern   Not on file  Social History Narrative   Lives with wife   [place]   Caffeine-  diet Mtn Dew, 2 glasses   Social Determinants of Health   Financial Resource Strain: Low Risk  (02/08/2023)   Received from [facility], [facility]   Overall Financial Resource Strain (CARDIA)    Difficulty of Paying Living Expenses: Not hard at all  Food Insecurity: No Food Insecurity (02/08/2023)   Received from [facility], [facility]   Hunger Vital Sign    Worried About Running Out of Food in the Last Year:  Never true    Ran Out of Food in the Last Year: Never true  Transportation Needs: No Transportation Needs (02/08/2023)   Received from [facility], [facility]   PRAPARE - Transportation    Lack of Transportation (Medical): No    Lack of Transportation (Non-Medical): No  Physical Activity: Patient Declined (02/08/2023)   Received from [facility], [facility]   Exercise Vital Sign    Days of Exercise per Week: Patient declined    Minutes of Exercise per Session: Patient declined  Stress: No Stress Concern Present (02/08/2023)   Received from [facility], [facility] - Occupational Stress Questionnaire    Feeling of Stress : Not at all  Social Connections: Socially Integrated (02/08/2023)   Received from [facility], [facility]   Social Network    How would you rate your social network (family, work, friends)?: Good participation with social networks  Intimate Partner Violence: Not At Risk (02/08/2023)   Received from [facility], [facility]   HITS    Over the last 12 months how often did your partner physically hurt you?: 1    Over the last 12 months how often did your partner insult you or talk down to you?: 1    Over the last 12 months how often did your partner threaten you with physical harm?: 1    Over the last 12 months how often did your partner scream or curse at you?: 1    FAMILY HISTORY: Family History  Problem Relation Age of Onset   Aneurysm Mother        brain   Hypertension Mother    Cancer Father    Heart disease Father    Diabetes Father    Hypertension Father    Hyperlipidemia Father    Cancer Maternal Grandmother        melanoma   Heart disease Paternal Grandfather    Colon cancer Neg Hx     ALLERGIES:  is allergic to duloxetine, gluten meal, and codeine.  MEDICATIONS:  Current Outpatient Medications  Medication Sig Dispense Refill   amitriptyline (ELAVIL) 25 MG tablet Take 25 mg by mouth  at bedtime.     aspirin 81 MG EC tablet Take 81 mg by mouth daily.     atorvastatin (LIPITOR) 40 MG tablet Take 1 tablet (40 mg total) by mouth daily. (Patient taking differently: Take 40 mg by mouth daily.) 90 tablet 3   furosemide (LASIX) 20 MG tablet Take 40 mg by mouth 2 (two) times daily.     hydrochlorothiazide (HYDRODIURIL) 12.5 MG tablet Take 12.5 mg by mouth daily.     insulin glargine (LANTUS SOLOSTAR) 100 UNIT/ML Solostar Pen Inject 35 Units into the skin 2 (two) times daily.     JARDIANCE 10 MG TABS tablet Take 10 mg by mouth daily.     lisinopril (ZESTRIL) 20 MG tablet Take 20 mg by mouth daily.     pantoprazole (PROTONIX) 40 MG tablet Take 1 tablet  by mouth daily.     sulfamethoxazole-trimethoprim (BACTRIM) 400-80 MG tablet Take 1 tablet by mouth 2 (two) times daily.     tirzepatide (MOUNJARO) 12.5 MG/0.5ML Pen Inject 12.5 mg into the skin once a week. 2 mL 3   tolterodine (DETROL LA) 4 MG 24 hr capsule Take 4 mg by mouth daily.     traMADol (ULTRAM) 50 MG tablet Take 50 mg by mouth every 6 (six) hours as needed for moderate pain.     cloNIDine (CATAPRES) 0.2 MG tablet Take 0.2 mg by mouth 2 (two) times daily.     ibuprofen (ADVIL) 200 MG tablet Take 400 mg by mouth every 6 (six) hours as needed for moderate pain.     tobramycin-dexamethasone (TOBRADEX) ophthalmic ointment Place 1 Application into both eyes See admin instructions. Apply to eye(s) after eye injections as needed for irritation     No current facility-administered medications for this visit.      PHYSICAL EXAMINATION: Vitals:   08/05/23 1548  BP: (!) 149/73  Pulse: 64  Temp: (!) 97.1 F (36.2 C)  SpO2: 100%   Filed Weights   08/05/23 1548  Weight: 203 lb (92.1 kg)   Physical Exam Vitals reviewed.  Constitutional:      Appearance: He is not ill-appearing.     Comments: Right tonsillar mass   HENT:     Head: Normocephalic.  Cardiovascular:     Rate and Rhythm: Normal rate and regular rhythm.   Pulmonary:     Comments: Decreased breath sounds bilaterally.  Abdominal:     General: There is no distension.     Palpations: Abdomen is soft.     Tenderness: There is no abdominal tenderness.  Genitourinary:    Comments: catheter Musculoskeletal:        General: No deformity.  Lymphadenopathy:     Cervical: Cervical adenopathy present.  Skin:    General: Skin is warm.     Coloration: Skin is not pale.  Neurological:     Mental Status: He is alert and oriented to person, place, and time.  Psychiatric:        Mood and Affect: Mood normal.        Behavior: Behavior normal.     LABORATORY DATA:  I have reviewed the data as listed Lab Results  Component Value Date   WBC 5.3 08/05/2023   HGB 9.2 (L) 08/05/2023   HCT 29.7 (L) 08/05/2023   MCV 91.1 08/05/2023   PLT 211 08/05/2023   Recent Labs    02/02/23 1441 02/03/23 1849 04/01/23 1433 05/02/23 1448 07/01/23 1442 07/15/23 1344 08/05/23 1521  NA 134*   < > 139   < > 139 139 138  K 3.6   < > 3.9   < > 4.5 4.8 3.9  CL 105   < > 110   < > 113* 109 107  CO2 23   < > 21*   < > 20* 22 23  GLUCOSE 295*   < > 227*   < > 161* 163* 145*  BUN 31*   < > 32*   < > 30* 19 35*  CREATININE 1.87*   < > 2.07*   < > 1.80* 1.84* 2.46*  CALCIUM 8.4*   < > 8.3*   < > 8.3* 8.9 8.3*  GFRNONAA 40*   < > 36*   < > 42* 41* 29*  PROT 6.0*  --  6.1*  --   --   --   --  ALBUMIN 3.1*  --  3.1*  --   --   --   --   AST 22  --  23  --   --   --   --   ALT 16  --  20  --   --   --   --   ALKPHOS 62  --  83  --   --   --   --   BILITOT 0.4  --  0.8  --   --   --   --    < > = values in this interval not displayed.     DG C-Arm 1-60 Min-No Report  Result Date: 07/26/2023 Fluoroscopy was utilized by the requesting physician.  No radiographic interpretation.   [place] FNA SOFT TISSUE  Result Date: 07/22/2023 INDICATION: Right neck mass EXAM: Ultrasound-guided fine-needle aspiration of right neck mass Ultrasound-guided aspiration of right neck  mass Ultrasound-guided core needle biopsy of right neck mass MEDICATIONS: None. ANESTHESIA/SEDATION: Local analgesia COMPLICATIONS: None immediate. PROCEDURE: Informed written consent was obtained from the patient after a thorough discussion of the procedural risks, benefits and alternatives. All questions were addressed. Maximal Sterile Barrier Technique was utilized including caps, mask, sterile gowns, sterile gloves, sterile drape, hand hygiene and skin antiseptic. A timeout was performed prior to the initiation of the procedure. The patient was placed supine on the exam table. Ultrasound of the right neck demonstrated heterogeneous mixed cystic and solid neck mass. Skin entry site was marked, and the overlying skin was prepped draped in the standard sterile fashion. Local analgesia was obtained with 1% lidocaine. Using ultrasound guidance, fine-needle aspiration was initially performed of the limited solid component using a 25 gauge needle x4 total passes. Specimens were submitted to an on-site cytotechnologist which did not show appreciable cellularity in the material. Following this, a 19 gauge Yueh catheter was guided into the cystic portion of the lesion, and approximately 3 mL of bloody material was aspirated and also submitted to the cytotechnologist. Following aspiration, a more clearly identifiable solid portion of the lesion remained present. In order to maximize diagnostic yield, the decision was made to proceed with core needle biopsy. Again under ultrasound guidance, core needle biopsy of the now more solid-appearing lesion was performed using an 18 gauge core biopsy device x3 total passes. Specimens were submitted in formalin to the cytotechnologist. Limited postprocedure imaging demonstrated no hematoma. A clean dressing was placed after manual hemostasis. The patient tolerated the procedure well without immediate complication. IMPRESSION: Successful ultrasound-guided fine-needle aspiration,  aspiration, and core needle biopsy of the right neck mass. All specimens submitted to the cytotechnologist for further handling. Electronically Signed   By: Olive Bass M.D.   On: 07/22/2023 15:21   [place] CORE BIOPSY (SOFT TISSUE)  Result Date: 07/22/2023 INDICATION: Right neck mass EXAM: Ultrasound-guided fine-needle aspiration of right neck mass Ultrasound-guided aspiration of right neck mass Ultrasound-guided core needle biopsy of right neck mass MEDICATIONS: None. ANESTHESIA/SEDATION: Local analgesia COMPLICATIONS: None immediate. PROCEDURE: Informed written consent was obtained from the patient after a thorough discussion of the procedural risks, benefits and alternatives. All questions were addressed. Maximal Sterile Barrier Technique was utilized including caps, mask, sterile gowns, sterile gloves, sterile drape, hand hygiene and skin antiseptic. A timeout was performed prior to the initiation of the procedure. The patient was placed supine on the exam table. Ultrasound of the right neck demonstrated heterogeneous mixed cystic and solid neck mass. Skin entry site was marked, and the overlying skin was prepped  draped in the standard sterile fashion. Local analgesia was obtained with 1% lidocaine. Using ultrasound guidance, fine-needle aspiration was initially performed of the limited solid component using a 25 gauge needle x4 total passes. Specimens were submitted to an on-site cytotechnologist which did not show appreciable cellularity in the material. Following this, a 19 gauge Yueh catheter was guided into the cystic portion of the lesion, and approximately 3 mL of bloody material was aspirated and also submitted to the cytotechnologist. Following aspiration, a more clearly identifiable solid portion of the lesion remained present. In order to maximize diagnostic yield, the decision was made to proceed with core needle biopsy. Again under ultrasound guidance, core needle biopsy of the now more  solid-appearing lesion was performed using an 18 gauge core biopsy device x3 total passes. Specimens were submitted in formalin to the cytotechnologist. Limited postprocedure imaging demonstrated no hematoma. A clean dressing was placed after manual hemostasis. The patient tolerated the procedure well without immediate complication. IMPRESSION: Successful ultrasound-guided fine-needle aspiration, aspiration, and core needle biopsy of the right neck mass. All specimens submitted to the cytotechnologist for further handling. Electronically Signed   By: Olive Bass M.D.   On: 07/22/2023 15:21   [place] FINE NEEDLE ASP EA ADDL LESION  Result Date: 07/22/2023 INDICATION: Right neck mass EXAM: Ultrasound-guided fine-needle aspiration of right neck mass Ultrasound-guided aspiration of right neck mass Ultrasound-guided core needle biopsy of right neck mass MEDICATIONS: None. ANESTHESIA/SEDATION: Local analgesia COMPLICATIONS: None immediate. PROCEDURE: Informed written consent was obtained from the patient after a thorough discussion of the procedural risks, benefits and alternatives. All questions were addressed. Maximal Sterile Barrier Technique was utilized including caps, mask, sterile gowns, sterile gloves, sterile drape, hand hygiene and skin antiseptic. A timeout was performed prior to the initiation of the procedure. The patient was placed supine on the exam table. Ultrasound of the right neck demonstrated heterogeneous mixed cystic and solid neck mass. Skin entry site was marked, and the overlying skin was prepped draped in the standard sterile fashion. Local analgesia was obtained with 1% lidocaine. Using ultrasound guidance, fine-needle aspiration was initially performed of the limited solid component using a 25 gauge needle x4 total passes. Specimens were submitted to an on-site cytotechnologist which did not show appreciable cellularity in the material. Following this, a 19 gauge Yueh catheter was guided into  the cystic portion of the lesion, and approximately 3 mL of bloody material was aspirated and also submitted to the cytotechnologist. Following aspiration, a more clearly identifiable solid portion of the lesion remained present. In order to maximize diagnostic yield, the decision was made to proceed with core needle biopsy. Again under ultrasound guidance, core needle biopsy of the now more solid-appearing lesion was performed using an 18 gauge core biopsy device x3 total passes. Specimens were submitted in formalin to the cytotechnologist. Limited postprocedure imaging demonstrated no hematoma. A clean dressing was placed after manual hemostasis. The patient tolerated the procedure well without immediate complication. IMPRESSION: Successful ultrasound-guided fine-needle aspiration, aspiration, and core needle biopsy of the right neck mass. All specimens submitted to the cytotechnologist for further handling. Electronically Signed   By: Olive Bass M.D.   On: 07/22/2023 15:21    No problem-specific Assessment & Plan notes found for this encounter.   # Anemia-symptomatic fatigue hemoglobin 9-10--likely multifactorial-iron malabsorption/celiac disease/CKD-stage MAY 2024- Bone marrow biopsy/aspiration- The bone marrow is generally normocellular for age with trilineage  hematopoiesis and nonspecific changes, likely secondary in nature in this setting.  There was no morphologic  evidence of a lymphoproliferative  process or plasma cell neoplasm. He is s/p venofer and retacrit to maintain hemoglobin > 10.   # Hemoglobin today is 9.2. Iron studies pending. Proceed with retacrit today. Plan for venofer next week and week after.     # Right neck LN 2 cm [5 weeks]-Right tonsil enlargement s/p Anti-biotics- awaiting ENT evaluation-    # Ckd stage IIIb-IV - managed by [facility] nephrology. Due to uncontrolled diabetes. GFR- 40s. Today 27. Worse but within his recent baseline. Recommended follow up with  nephrology.    # Diabetes [[facility] Hb A1c- 7.4]-PBG 145 - continue follow-up with endocrinology/monitoring of blood sugars. Stable.   DISPOSITION: Retacrit today Venofer next week (requests Friday) 2 weeks- venofer 1 mo- lab (cbc, bmp), Dr Donneta Romberg, Poss venofer or retacrit- la   All questions were answered. The patient knows to call the clinic with any problems, questions or concerns.    Alinda Dooms, NP 08/05/2023

## 2023-08-12 ENCOUNTER — Inpatient Hospital Stay: Payer: BC Managed Care – PPO

## 2023-08-12 VITALS — BP 104/68 | HR 69 | Temp 96.3°F | Resp 18

## 2023-08-12 DIAGNOSIS — D649 Anemia, unspecified: Secondary | ICD-10-CM | POA: Diagnosis not present

## 2023-08-12 MED ORDER — SODIUM CHLORIDE 0.9 % IV SOLN
200.0000 mg | Freq: Once | INTRAVENOUS | Status: AC
  Administered 2023-08-12: 200 mg via INTRAVENOUS
  Filled 2023-08-12: qty 200

## 2023-08-12 MED ORDER — SODIUM CHLORIDE 0.9 % IV SOLN
Freq: Once | INTRAVENOUS | Status: AC
  Filled 2023-08-12: qty 250

## 2023-08-15 ENCOUNTER — Ambulatory Visit
Admission: RE | Admit: 2023-08-15 | Discharge: 2023-08-15 | Disposition: A | Discharge status: Home / Self Care | Payer: BC Managed Care – PPO | Source: Ambulatory Visit | Attending: Otolaryngology | Admitting: Otolaryngology

## 2023-08-15 DIAGNOSIS — R221 Localized swelling, mass and lump, neck: Secondary | ICD-10-CM

## 2023-08-18 DIAGNOSIS — C801 Malignant (primary) neoplasm, unspecified: Secondary | ICD-10-CM

## 2023-08-18 HISTORY — DX: Malignant (primary) neoplasm, unspecified: C80.1

## 2023-08-19 ENCOUNTER — Inpatient Hospital Stay: Payer: BC Managed Care – PPO | Attending: Internal Medicine

## 2023-08-19 ENCOUNTER — Encounter: Payer: Self-pay | Admitting: Internal Medicine

## 2023-08-19 VITALS — BP 176/90 | HR 68 | Temp 96.3°F | Resp 19

## 2023-08-19 DIAGNOSIS — D649 Anemia, unspecified: Secondary | ICD-10-CM | POA: Diagnosis present

## 2023-08-19 MED ORDER — SODIUM CHLORIDE 0.9 % IV SOLN
Freq: Once | INTRAVENOUS | Status: AC
  Filled 2023-08-19: qty 250

## 2023-08-19 MED ORDER — SODIUM CHLORIDE 0.9 % IV SOLN
200.0000 mg | Freq: Once | INTRAVENOUS | Status: AC
  Administered 2023-08-19: 200 mg via INTRAVENOUS
  Filled 2023-08-19: qty 200

## 2023-09-05 ENCOUNTER — Other Ambulatory Visit (HOSPITAL_COMMUNITY): Payer: Self-pay

## 2023-09-05 MED ORDER — MOUNJARO 15 MG/0.5ML ~~LOC~~ SOAJ
15.0000 mg | SUBCUTANEOUS | 3 refills | Status: DC
  Filled 2023-09-05: qty 2, 28d supply, fill #0
  Filled 2023-10-01: qty 2, 28d supply, fill #1
  Filled 2023-10-31: qty 2, 28d supply, fill #2
  Filled 2024-01-11: qty 2, 28d supply, fill #3

## 2023-09-08 ENCOUNTER — Other Ambulatory Visit (HOSPITAL_COMMUNITY): Payer: Self-pay

## 2023-09-09 ENCOUNTER — Inpatient Hospital Stay: Payer: BC Managed Care – PPO

## 2023-09-09 ENCOUNTER — Inpatient Hospital Stay: Payer: BC Managed Care – PPO | Admitting: Nurse Practitioner

## 2023-09-09 ENCOUNTER — Encounter: Payer: Self-pay | Admitting: Nurse Practitioner

## 2023-09-09 ENCOUNTER — Inpatient Hospital Stay (HOSPITAL_BASED_OUTPATIENT_CLINIC_OR_DEPARTMENT_OTHER): Payer: BC Managed Care – PPO | Admitting: Nurse Practitioner

## 2023-09-09 ENCOUNTER — Ambulatory Visit: Payer: BC Managed Care – PPO

## 2023-09-09 VITALS — BP 165/79 | HR 56 | Temp 95.6°F | Ht 68.0 in | Wt 191.8 lb

## 2023-09-09 VITALS — BP 135/68 | HR 61

## 2023-09-09 DIAGNOSIS — D649 Anemia, unspecified: Secondary | ICD-10-CM | POA: Diagnosis not present

## 2023-09-09 DIAGNOSIS — N1832 Chronic kidney disease, stage 3b: Secondary | ICD-10-CM | POA: Diagnosis not present

## 2023-09-09 DIAGNOSIS — D509 Iron deficiency anemia, unspecified: Secondary | ICD-10-CM | POA: Diagnosis not present

## 2023-09-09 DIAGNOSIS — C109 Malignant neoplasm of oropharynx, unspecified: Secondary | ICD-10-CM | POA: Diagnosis not present

## 2023-09-09 DIAGNOSIS — D631 Anemia in chronic kidney disease: Secondary | ICD-10-CM

## 2023-09-09 LAB — BASIC METABOLIC PANEL
Anion gap: 6 (ref 5–15)
BUN: 36 mg/dL — ABNORMAL HIGH (ref 8–23)
CO2: 23 mmol/L (ref 22–32)
Calcium: 8.6 mg/dL — ABNORMAL LOW (ref 8.9–10.3)
Chloride: 108 mmol/L (ref 98–111)
Creatinine, Ser: 2.03 mg/dL — ABNORMAL HIGH (ref 0.61–1.24)
GFR, Estimated: 37 mL/min — ABNORMAL LOW (ref >60)
Glucose, Bld: 182 mg/dL — ABNORMAL HIGH (ref 70–99)
Potassium: 5.5 mmol/L — ABNORMAL HIGH (ref 3.5–5.1)
Sodium: 137 mmol/L (ref 135–145)

## 2023-09-09 LAB — CBC WITH DIFFERENTIAL (CANCER CENTER ONLY)
Abs Immature Granulocytes: 0.03 10*3/uL (ref 0.00–0.07)
Basophils Absolute: 0 10*3/uL (ref 0.0–0.1)
Basophils Relative: 1 %
Eosinophils Absolute: 0.3 10*3/uL (ref 0.0–0.5)
Eosinophils Relative: 4 %
HCT: 32.9 % — ABNORMAL LOW (ref 39.0–52.0)
Hemoglobin: 10.4 g/dL — ABNORMAL LOW (ref 13.0–17.0)
Immature Granulocytes: 0 %
Lymphocytes Relative: 10 %
Lymphs Abs: 0.8 10*3/uL (ref 0.7–4.0)
MCH: 28.7 pg (ref 26.0–34.0)
MCHC: 31.6 g/dL (ref 30.0–36.0)
MCV: 90.6 fL (ref 80.0–100.0)
Monocytes Absolute: 0.6 10*3/uL (ref 0.1–1.0)
Monocytes Relative: 8 %
Neutro Abs: 5.7 10*3/uL (ref 1.7–7.7)
Neutrophils Relative %: 77 %
Platelet Count: 191 10*3/uL (ref 150–400)
RBC: 3.63 MIL/uL — ABNORMAL LOW (ref 4.22–5.81)
RDW: 14.2 % (ref 11.5–15.5)
WBC Count: 7.4 10*3/uL (ref 4.0–10.5)
nRBC: 0 % (ref 0.0–0.2)

## 2023-09-09 MED ORDER — SODIUM CHLORIDE 0.9% FLUSH
3.0000 mL | Freq: Once | INTRAVENOUS | Status: AC | PRN
  Administered 2023-09-09: 3 mL
  Filled 2023-09-09: qty 3

## 2023-09-09 MED ORDER — IRON SUCROSE 20 MG/ML IV SOLN
200.0000 mg | Freq: Once | INTRAVENOUS | Status: AC
Start: 2023-09-09 — End: 2023-09-09
  Administered 2023-09-09: 200 mg via INTRAVENOUS
  Filled 2023-09-09: qty 10

## 2023-09-09 MED ORDER — SODIUM CHLORIDE 0.9% FLUSH
10.0000 mL | Freq: Once | INTRAVENOUS | Status: AC | PRN
  Administered 2023-09-09: 10 mL
  Filled 2023-09-09: qty 10

## 2023-09-09 NOTE — Progress Notes (Signed)
Patient declined to wait the 30 minutes for post iron infusion observation today. Tolerated infusion well. VSS. 

## 2023-09-09 NOTE — Progress Notes (Signed)
[facility] CONSULT NOTE  Patient Care Team: Patient, [name] as PCP - General (General Practice) Otelia Sergeant, NP (Inactive) as Nurse Practitioner (Endocrinology) Earna Coder, MD as Consulting Physician (Oncology)  CHIEF COMPLAINTS/PURPOSE OF CONSULTATION: ANEMIA  HEMATOLOGY HISTORY:  # ANEMIA PROGRESSIVE since 2020 12; 2022- 10 MCV- 90s; colonoscopy 2015; multiple endoscopies-last September 2021-biopsy active celiac disease; MAY 2024- Bone marrow biopsy/aspiration- The bone marrow is generally normocellular for age with trilineage  hematopoiesis and nonspecific changes, likely secondary in nature in  this setting.  There is no morphologic evidence of a lymphoproliferative  process or plasma cell neoplasm.   #CKD stage III [[facility]]/diabetes [[facility]]; ? Celaic disease [[facility]];   - Labs: 07/2014 - tTG IgA 101 03/2020 - positive endomysial Ab IgA, tTG IgA 78 - CSY: 07/2014 - normal appearing terminal ileum, sigmoid diverticulosis, and non-bleeding internal hemorrhoids - EGD: 07/2014 - mild non-erosive gastritis, normal appearing duodenal mucosa with duodenal biopsies showing celiac sprue - EGD: 06/03/2020 - procedure aborted due to presence of food - EGD: 08/12/2020 - irregular Z-line found at [facility] with bx showing mild reflux esophagitis, 1 cm hiatal hernia, mild chronic gastritis, decreased folds found in entire duodenum, flattening found in entire duodenum and scalloped mucosa with bx showing active Celiac disease with villous atrophy, increased intraepithelial lymphocytes, etc. Repeat in 1-year.  # 08/18/23- Tonsil biopsy: Invasive SCC, hpv associated ([facility])- PET -Markedly hypermetabolic right tonsillar soft tissue mass with an enlarged and hypermetabolic right level 2 node, consistent with biopsy proven squamous cell carcinoma and likely additional right neck nodal metastases. -No sites of hypermetabolic distant metastatic disease.    HISTORY OF  PRESENTING ILLNESS: Alone.  Ambulating independently.  Brian Nielsen 62 y.o. male with symptomatic anemia secondary to CKD and comorbidities who returns to clinic for follow up. In interim, he underwent biopsy of mass on right jaw which was consistent with squamous cell carcinoma of tonsil. He has established care with [facility] med-onc and rad-onc for treatment of locally advanced head and neck cancer. Recommendation for concurrent chemotherapy and radiation. THey've also presented him with option for clinical trial, [id number], a phase II trial of induction and maintenance pembrolizumab and olaparib with cisplatin-radiation. He is accompanied by his wife today. They are concerned about his blood counts while undergoing treatment.    Review of Systems  Constitutional:  Positive for malaise/fatigue. Negative for chills, diaphoresis, fever and weight loss.  HENT:  Positive for sore throat. Negative for nosebleeds.   Eyes:  Negative for double vision.  Respiratory:  Positive for shortness of breath. Negative for cough, hemoptysis, sputum production and wheezing.   Cardiovascular:  Negative for chest pain, palpitations, orthopnea and leg swelling.  Gastrointestinal:  Negative for abdominal pain, blood in stool, heartburn, melena, nausea and vomiting.  Genitourinary:  Negative for dysuria, frequency, hematuria and urgency.  Musculoskeletal:  Positive for back pain and joint pain. Negative for falls and myalgias.  Skin: Negative.  Negative for itching and rash.  Neurological:  Positive for weakness. Negative for dizziness, tingling, focal weakness and headaches.  Endo/Heme/Allergies:  Does not bruise/bleed easily.  Psychiatric/Behavioral:  Negative for depression. The patient is nervous/anxious. The patient does not have insomnia.     MEDICAL HISTORY:  Past Medical History:  Diagnosis Date   Adult celiac disease 2015   followed by dr Servando Snare (GI)   Anemia associated with chronic renal failure     hematologist--- dr Sarajane Jews;  treated with iron infusions   Benign  localized prostatic hyperplasia with lower urinary tract symptoms (LUTS)    urologist--- dr Retta Diones   Charcot foot due to diabetes mellitus (HCC)    Chronic constipation    CKD (chronic kidney disease), stage III [facility])    nephrologist--- dr Wolfgang Phoenix;  renal bx 11-20-2021   Diverticulosis of colon    Edema of both lower extremities    GERD (gastroesophageal reflux disease)    History of adenomatous polyp of colon    HTN (hypertension)    Hyperlipidemia, mixed    Peripheral neuropathy    Peyronie's disease    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 meatal stricture    Retinopathy due to secondary diabetes mellitus (HCC)    both eyes  treated with injecitons   Thoracic spondylosis without myelopathy 11/22/2016   Type 2 diabetes mellitus [facility])    endocrinologist--- dr nida   Vitamin D deficiency    Wears hearing aid in both ears     SURGICAL HISTORY: Past Surgical History:  Procedure Laterality Date   BIOPSY N/A 09/07/2021   Procedure: BIOPSY;  Surgeon: Midge Minium, MD;  Location: [facility];  Service: Endoscopy;  Laterality: N/A;   CATARACT EXTRACTION W/ INTRAOCULAR LENS IMPLANT Bilateral 2023   COLONOSCOPY N/A 05/03/2014   Procedure: COLONOSCOPY;  Surgeon: West Bali, MD;  [facility];  Service: Endoscopy;  Laterality: N/A;  12:00   COLONOSCOPY WITH PROPOFOL N/A 09/07/2021   Procedure: COLONOSCOPY WITH PROPOFOL;  Surgeon: Midge Minium, MD;  Location: [facility];  Service: Endoscopy;  Laterality: N/A;   CYSTOSCOPY WITH INJECTION N/A 07/26/2023   Procedure: CYSTOSCOPY WITH BOTOX INJECTION 100 UNITS;  Surgeon: Noel Christmas, MD;  Location: [facility] OR[facility];  Service: Urology;  Laterality: N/A;  30 minutes   CYSTOSCOPY WITH URETHRAL DILATATION N/A 01/10/2023   Procedure: CYSTOSCOPY;  Surgeon: Marcine Matar, MD;  Location: [facility];  Service: Urology;  Laterality: N/A;    ESOPHAGOGASTRODUODENOSCOPY N/A 05/03/2014   Procedure: ESOPHAGOGASTRODUODENOSCOPY (EGD);  Surgeon: West Bali, MD;  [facility];  Service: Endoscopy;  Laterality: N/A;   ESOPHAGOGASTRODUODENOSCOPY (EGD) WITH PROPOFOL N/A 06/03/2020   Procedure: ESOPHAGOGASTRODUODENOSCOPY (EGD) WITH PROPOFOL;  Surgeon: Regis Bill, MD;  Location: [facility] ENDOSCOPY;  Service: Endoscopy;  Laterality: N/A;   ESOPHAGOGASTRODUODENOSCOPY (EGD) WITH PROPOFOL N/A 09/07/2021   Procedure: ESOPHAGOGASTRODUODENOSCOPY (EGD) WITH PROPOFOL;  Surgeon: Midge Minium, MD;  Location: [facility];  Service: Endoscopy;  Laterality: N/A;  Diabetic   IR BONE MARROW BIOPSY & ASPIRATION  04/27/2023   MEATOTOMY N/A 01/10/2023   Procedure: MEATOTOMY ADULT;  Surgeon: Marcine Matar, MD;  Location: [facility];  Service: Urology;  Laterality: N/A;   PARS PLANA VITRECTOMY Right 03/21/2021   Procedure: PARS PLANA VITRECTOMY 25 GAUGE WITH INJECTION OF ANTIBIOTICS FOR ENDOPHTHALMITIS;  Surgeon: Carmela Rima, MD;  Location: [facility] OR;  Service: Ophthalmology;  Laterality: Right;   POLYPECTOMY N/A 09/07/2021   Procedure: POLYPECTOMY;  Surgeon: Midge Minium, MD;  Location: [facility];  Service: Endoscopy;  Laterality: N/A;   REPAIR EXTENSOR TENDON Right 08/11/2022   Procedure: EXTENSOR CARPI ULNARIS TENDON DEBRIDEMENT, POSSIBLE SUBSHEATH RECONSTRUCTION;  Surgeon: Marlyne Beards, MD;  Location: [facility];  Service: Orthopedics;  Laterality: Right;  or regional plus MAC    SOCIAL HISTORY: Social History   Socioeconomic History   Marital status: Married    Spouse name: [name]   Number of children: 1   Years of education: 12   Highest education level: Not on file  Occupational History   Occupation: Architect: [employer]  Tobacco Use   Smoking status: Never   Smokeless tobacco: Current    Types: Snuff  Vaping Use   Vaping status: Never Used   Substance and Sexual Activity   Alcohol use: Not Currently    Comment: seldom   Drug use: Never   Sexual activity: Yes    Birth control/protection: Surgical  Other Topics Concern   Not on file  Social History Narrative   Lives with wife   [place]/chick[place]   Caffeine- diet Mtn Dew, 2 glasses   Works as Scientist, water quality for [employer]   Social Determinants of Health   Financial Resource Strain: Low Risk  (02/08/2023)   Received from [employer], [facility]   Overall Financial Resource Strain (CARDIA)    Difficulty of Paying Living Expenses: Not hard at all  Food Insecurity: No Food Insecurity (02/08/2023)   Received from [facility], [facility]   Hunger Vital Sign    Worried About Running Out of Food in the Last Year: Never true    Ran Out of Food in the Last Year: Never true  Transportation Needs: No Transportation Needs (02/08/2023)   Received from [facility], [facility]   PRAPARE - Transportation    Lack of Transportation (Medical): No    Lack of Transportation (Non-Medical): No  Physical Activity: Patient Declined (02/08/2023)   Received from [facility], [facility]   Exercise Vital Sign    Days of Exercise per Week: Patient declined    Minutes of Exercise per Session: Patient declined  Stress: No Stress Concern Present (02/08/2023)   Received from [facility], [facility] of [facility] - Occupational Stress Questionnaire    Feeling of Stress : Not at all  Social Connections: Socially Integrated (02/08/2023)   Received from [facility], [facility]   Social Network    How would you rate your social network (family, work, friends)?: Good participation with social networks  Intimate Partner Violence: Not At Risk (02/08/2023)   Received from [facility], [facility]   HITS    Over the last 12 months how often did your partner physically hurt you?: Never    Over the last 12 months how  often did your partner insult you or talk down to you?: Never    Over the last 12 months how often did your partner threaten you with physical harm?: Never    Over the last 12 months how often did your partner scream or curse at you?: Never    FAMILY HISTORY: Family History  Problem Relation Age of Onset   Aneurysm Mother        brain   Hypertension Mother    Cancer Father    Heart disease Father    Diabetes Father    Hypertension Father    Hyperlipidemia Father    Cancer Maternal Grandmother        melanoma   Heart disease Paternal Grandfather    Colon cancer Neg Hx     ALLERGIES:  is allergic to duloxetine, gluten meal, and codeine.  MEDICATIONS:  Current Outpatient Medications  Medication Sig Dispense Refill   amitriptyline (ELAVIL) 25 MG tablet Take 25 mg by mouth at bedtime.     aspirin 81 MG EC tablet Take 81 mg by mouth daily.     atorvastatin (LIPITOR) 40 MG tablet Take 1 tablet (40 mg total)  by mouth daily. (Patient taking differently: Take 40 mg by mouth daily.) 90 tablet 3   cloNIDine (CATAPRES) 0.2 MG tablet Take 0.2 mg by mouth 2 (two) times daily.     furosemide (LASIX) 20 MG tablet Take 40 mg by mouth 2 (two) times daily.     hydrochlorothiazide (HYDRODIURIL) 12.5 MG tablet Take 12.5 mg by mouth daily.     insulin glargine (LANTUS SOLOSTAR) 100 UNIT/ML Solostar Pen Inject 35 Units into the skin 2 (two) times daily.     JARDIANCE 10 MG TABS tablet Take 10 mg by mouth daily.     lisinopril (ZESTRIL) 20 MG tablet Take 20 mg by mouth daily.     pantoprazole (PROTONIX) 40 MG tablet Take 1 tablet by mouth daily.     sulfamethoxazole-trimethoprim (BACTRIM) 400-80 MG tablet Take 1 tablet by mouth 2 (two) times daily.     tirzepatide (MOUNJARO) 12.5 MG/0.5ML Pen Inject 12.5 mg into the skin once a week. 2 mL 3   tirzepatide (MOUNJARO) 15 MG/0.5ML Pen Inject 15 mg into the skin once a week. 2 mL 3   tolterodine (DETROL LA) 4 MG 24 hr capsule Take 4 mg by mouth daily.      traMADol (ULTRAM) 50 MG tablet Take 50 mg by mouth every 6 (six) hours as needed for moderate pain.     No current facility-administered medications for this visit.      PHYSICAL EXAMINATION: Vitals:   09/09/23 1356  BP: (!) 165/79  Pulse: (!) 56  Temp: (!) 95.6 F (35.3 C)  SpO2: 98%   Filed Weights   09/09/23 1356  Weight: 191 lb 12.8 oz (87 kg)   Physical Exam Vitals reviewed.  Constitutional:      Appearance: He is not ill-appearing.     Comments: Right tonsillar mass   HENT:     Head: Normocephalic.  Cardiovascular:     Rate and Rhythm: Normal rate and regular rhythm.  Pulmonary:     Comments: Decreased breath sounds bilaterally.  Abdominal:     General: There is no distension.     Palpations: Abdomen is soft.     Tenderness: There is no abdominal tenderness.  Genitourinary:    Comments: catheter Musculoskeletal:        General: No deformity.  Lymphadenopathy:     Cervical: Cervical adenopathy present.  Skin:    General: Skin is warm.     Coloration: Skin is not pale.  Neurological:     Mental Status: He is alert and oriented to person, place, and time.  Psychiatric:        Mood and Affect: Mood normal.        Behavior: Behavior normal.    LABORATORY DATA:  I have reviewed the data as listed Lab Results  Component Value Date   WBC 7.4 09/09/2023   HGB 10.4 (L) 09/09/2023   HCT 32.9 (L) 09/09/2023   MCV 90.6 09/09/2023   PLT 191 09/09/2023   Recent Labs    02/02/23 1441 02/03/23 1849 04/01/23 1433 05/02/23 1448 07/01/23 1442 07/15/23 1344 08/05/23 1521  NA 134*   < > 139   < > 139 139 138  K 3.6   < > 3.9   < > 4.5 4.8 3.9  CL 105   < > 110   < > 113* 109 107  CO2 23   < > 21*   < > 20* 22 23  GLUCOSE 295*   < > 227*   < >  161* 163* 145*  BUN 31*   < > 32*   < > 30* 19 35*  CREATININE 1.87*   < > 2.07*   < > 1.80* 1.84* 2.46*  CALCIUM 8.4*   < > 8.3*   < > 8.3* 8.9 8.3*  GFRNONAA 40*   < > 36*   < > 42* 41* 29*  PROT 6.0*  --  6.1*   --   --   --   --   ALBUMIN 3.1*  --  3.1*  --   --   --   --   AST 22  --  23  --   --   --   --   ALT 16  --  20  --   --   --   --   ALKPHOS 62  --  83  --   --   --   --   BILITOT 0.4  --  0.8  --   --   --   --    < > = values in this interval not displayed.     MR NECK SOFT TISSUE ONLY WO CONTRAST  Result Date: 08/26/2023 CLINICAL DATA:  Neck mass noticed 2 months ago. EXAM: MRI OF THE NECK WITHOUT CONTRAST TECHNIQUE: Multiplanar, multisequence MR imaging of the neck was performed. No intravenous contrast was administered. COMPARISON:  None Available. FINDINGS: Pharynx and larynx: There is asymmetric T2 hyperintense soft tissue in the right aspect of the oropharynx, palatine tonsil, and the right tongue base. Possible asymmetric medialization of the vocal cord on the right (series 9, image 34). The epiglottis is normal in appearance. No evidence of retropharyngeal effusion. Salivary glands: Bilateral parotid glands and the left submandibular gland are normal in appearance. The inferior aspect of the T2 hyperintense soft tissue in the right aspect of the oropharynx may abut the medial and superior margins of the right submandibular gland (series 3, image 11). Thyroid: Unremarkable Lymph nodes: Asymmetrically enlarged right level 2A lymph node measuring up to 2 cm. Vascular: Flow voids are preserved Limited intracranial: Acute abnormality Visualized orbits: Bilateral lens replacement. Mastoids and visualized paranasal sinuses: No middle ear or mastoid effusion. Trace mucosal thickening of the floor of bilateral maxillary sinuses. Skeleton: Negative Upper chest: Negative Other: Negative IMPRESSION: 1. Asymmetric T2 hyperintense soft tissue in the right aspect of the oropharynx, palatine tonsil, and right tongue base, as above. Findings are worrisome for a primary oropharyngeal malignancy. Correlate with results from prior biopsy 2. Asymmetrically enlarged right level 2A lymph node measuring up to 2  cm is worrisome for nodal metastatic disease. 3. Possible asymmetric medialization of the vocal cord on the right. Recommend correlation with direct visualization. Electronically Signed   By: Lorenza Cambridge M.D.   On: 08/26/2023 10:53    No problem-specific Assessment & Plan notes found for this encounter.   # Anemia-symptomatic fatigue hemoglobin 9-10--likely multifactorial-iron malabsorption/celiac disease/CKD-stage MAY 2024- Bone marrow biopsy/aspiration- The bone marrow is generally normocellular for age with trilineage  hematopoiesis and nonspecific changes, likely secondary in nature in this setting.  There was no morphologic evidence of a lymphoproliferative  process or plasma cell neoplasm. He is s/p venofer and retacrit to maintain hemoglobin > 10.   # Hemoglobin today is 9.2. Iron studies pending. Proceed with retacrit today. Plan for venofer next week and week after. Reviewed that we may need to stop retacrit given cancer diagnosis and plans for definitive treatment. Will have patient follow up with Dr  [name]. Unclear if he will continue interval management here or solely at [facility]/poss clinical trial (see below).    # Right Neck LN 2 cm- right tonsil enlargement s/p antibiotics. Biopsy consistent with SCC of tonsil, locally advanced, p16 positive. Being treated at [facility]. Consider clinical trial which we briefly discussed general information about clinic trials, enrollment, purpose, and care either on or off trial. He has met with rad onc and med onc with plans for concurrent chemo & radiation. I reviewed that often his blood counts will be managed by tertiary care while he is receiving treatment but particularly so if he elects to enroll in clinical trial. We can coordinate care as needed but generally he would [facility] care [facility] then can return to cancer center in future for ongoing management of blood counts.    # Ckd stage IIIb-IV - managed by [facility] nephrology. Due to uncontrolled  diabetes. GFR- 40s. Today, 37. Encouraged control of diabetes and follow up with nephrology.    # Diabetes [[facility] Hb A1c- 7.4]-PBG 182. Worsened by stress recently. Continue follow-up with endocrinology & monitoring of blood sugars. Stable.   DISPOSITION: Venofer today 2 weeks- lab (H&H), +/- retacrit 4 weeks- lab (cbc, bmp, ferritin, iron studies), Dr Donneta Romberg, +/- venofer or retacrit- la  All questions were answered. The patient knows to call the clinic with any problems, questions or concerns.   Alinda Dooms, NP 09/09/2023

## 2023-09-09 NOTE — Progress Notes (Signed)
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  Stan Head.

## 2023-09-15 ENCOUNTER — Encounter: Payer: Self-pay | Admitting: Gastroenterology

## 2023-09-15 ENCOUNTER — Encounter: Payer: Self-pay | Admitting: Internal Medicine

## 2023-09-15 ENCOUNTER — Ambulatory Visit: Payer: BC Managed Care – PPO | Admitting: Gastroenterology

## 2023-09-15 VITALS — BP 120/74 | HR 69 | Temp 98.5°F | Wt 192.0 lb

## 2023-09-15 DIAGNOSIS — K5900 Constipation, unspecified: Secondary | ICD-10-CM

## 2023-09-15 DIAGNOSIS — K219 Gastro-esophageal reflux disease without esophagitis: Secondary | ICD-10-CM | POA: Diagnosis not present

## 2023-09-15 NOTE — Progress Notes (Signed)
Primary Care Physician: Patient, [name]  Primary Gastroenterologist:  Dr. Midge Minium  No chief complaint on file.   HPI: Brian Nielsen is a 62 y.o. male here with a history of GERD.  The patient was on a PPI but when his renal function started to deteriorate the patient was told to stop the PPI and was put on Pepcid.  The patient was also offered evaluation for possible antireflux surgery but wanted to hold off during her last visit on making the decision to see how his reflux responded. The patient reports that his chronic constipation is still present.  He takes stool softeners when he wants to move his bowels.  The patient also has been diagnosed with throat cancer and is due for chemotherapy and radiation soon.  The patient states that the constipation at the same as before his colonoscopy.  Past Medical History:  Diagnosis Date   Adult celiac disease 2015   followed by dr Servando Snare (GI)   Anemia associated with chronic renal failure    hematologist--- dr Sarajane Jews;  treated with iron infusions   Benign localized prostatic hyperplasia with lower urinary tract symptoms (LUTS)    urologist--- dr Retta Diones   Charcot foot due to diabetes mellitus (HCC)    Chronic constipation    CKD (chronic kidney disease), stage III [facility])    nephrologist--- dr Wolfgang Phoenix;  renal bx 11-20-2021   Diverticulosis of colon    Edema of both lower extremities    GERD (gastroesophageal reflux disease)    History of adenomatous polyp of colon    HTN (hypertension)    Hyperlipidemia, mixed    Peripheral neuropathy    Peyronie's disease    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 meatal stricture    Retinopathy due to secondary diabetes mellitus (HCC)    both eyes  treated with injecitons   Thoracic spondylosis without myelopathy 11/22/2016   Type 2 diabetes mellitus [facility])    endocrinologist--- dr nida   Vitamin D deficiency    Wears hearing aid in both ears     Current Outpatient Medications  Medication Sig  Dispense Refill   amitriptyline (ELAVIL) 25 MG tablet Take 25 mg by mouth at bedtime.     aspirin 81 MG EC tablet Take 81 mg by mouth daily.     atorvastatin (LIPITOR) 40 MG tablet Take 1 tablet (40 mg total) by mouth daily. (Patient taking differently: Take 40 mg by mouth daily.) 90 tablet 3   cloNIDine (CATAPRES) 0.2 MG tablet Take 0.2 mg by mouth 2 (two) times daily.     furosemide (LASIX) 20 MG tablet Take 40 mg by mouth 2 (two) times daily.     gabapentin (NEURONTIN) 300 MG capsule Take 300 mg by mouth 3 times/day as needed-between meals & bedtime.     insulin glargine (LANTUS SOLOSTAR) 100 UNIT/ML Solostar Pen Inject 35 Units into the skin 2 (two) times daily.     JARDIANCE 10 MG TABS tablet Take 10 mg by mouth daily.     lisinopril (ZESTRIL) 20 MG tablet Take 20 mg by mouth daily.     pantoprazole (PROTONIX) 40 MG tablet Take 1 tablet by mouth daily.     tirzepatide (MOUNJARO) 15 MG/0.5ML Pen Inject 15 mg into the skin once a week. 2 mL 3   No current facility-administered medications for this visit.    Allergies as of 09/15/2023 - Review Complete 09/09/2023  Allergen Reaction Noted   Duloxetine Other (See Comments) 12/19/2019  Gluten meal  08/19/2021   Codeine Rash 03/21/2021    ROS:  General: Negative for anorexia, weight loss, fever, chills, fatigue, weakness. ENT: Negative for hoarseness, difficulty swallowing , nasal congestion. CV: Negative for chest pain, angina, palpitations, dyspnea on exertion, peripheral edema.  Respiratory: Negative for dyspnea at rest, dyspnea on exertion, cough, sputum, wheezing.  GI: See history of present illness. GU:  Negative for dysuria, hematuria, urinary incontinence, urinary frequency, nocturnal urination.  Endo: Negative for unusual weight change.    Physical Examination:   There were no vitals taken for this visit.  General: Well-nourished, well-developed in no acute distress.  Eyes: No icterus. Conjunctivae pink. Neuro: Alert  and oriented x 3.  Grossly intact. Skin: Warm and dry, no jaundice.   Psych: Alert and cooperative, normal mood and affect.  Labs:    Imaging Studies: No results found.  Assessment and Plan:   Brian Nielsen is a 62 y.o. y/o male who comes in today with a history of constipation.  The patient's GERD has been stable and he states he was put back on his Protonix and his kidney functions are being followed by his nephrologist.  The patient will be given samples of Linzess 290 mcg to be taken half hour before food.  The patient has been told that he should contact me if this works and then we will send him a prescription.  The patient has been explained the plan and agrees with it.     Midge Minium, MD. Clementeen Graham    Note: This dictation was prepared with Dragon dictation along with smaller phrase technology. Any transcriptional errors that result from this process are unintentional.

## 2023-09-16 ENCOUNTER — Telehealth: Payer: Self-pay | Admitting: Nurse Practitioner

## 2023-09-16 NOTE — Telephone Encounter (Signed)
pt wife called and stated that she wanted to cancel the lab/ injection on 11/8, and I asked if she wanted to reschedule and she stated that she doesn't really know and needs a call back to explain why he needs iron infusions and etc

## 2023-09-16 NOTE — Telephone Encounter (Signed)
Noted, thanks!

## 2023-09-19 ENCOUNTER — Inpatient Hospital Stay: Payer: BC Managed Care – PPO | Attending: Internal Medicine | Admitting: Nurse Practitioner

## 2023-09-19 DIAGNOSIS — E1122 Type 2 diabetes mellitus with diabetic chronic kidney disease: Secondary | ICD-10-CM | POA: Insufficient documentation

## 2023-09-19 DIAGNOSIS — D631 Anemia in chronic kidney disease: Secondary | ICD-10-CM | POA: Insufficient documentation

## 2023-09-19 DIAGNOSIS — D649 Anemia, unspecified: Secondary | ICD-10-CM | POA: Diagnosis not present

## 2023-09-19 DIAGNOSIS — E611 Iron deficiency: Secondary | ICD-10-CM | POA: Insufficient documentation

## 2023-09-19 DIAGNOSIS — I129 Hypertensive chronic kidney disease with stage 1 through stage 4 chronic kidney disease, or unspecified chronic kidney disease: Secondary | ICD-10-CM | POA: Insufficient documentation

## 2023-09-19 DIAGNOSIS — N183 Chronic kidney disease, stage 3 unspecified: Secondary | ICD-10-CM | POA: Insufficient documentation

## 2023-09-19 NOTE — Progress Notes (Signed)
Virtual Visit Progress Note  Symptom Management Clinic  [facility] at [facility] Department of the [place]. [facility] [address] [place], [place] [address] [phone] (phone) [phone] (fax)  I connected with Brian Nielsen on 09/19/23 at  3:30 PM EST by telephone visit and verified that I am speaking with the correct person using two identifiers.   I discussed the limitations, risks, security and privacy concerns of performing an evaluation and management service by telemedicine and the availability of in-person appointments. I also discussed with the patient that there may be a patient responsible charge related to this service. The patient expressed understanding and agreed to proceed.   Other persons participating in the visit and their role in the encounter: patient and wife   Patient's location: home  Provider's location: clinic   Patient Care Team: Chyrl Civatte, FNP as PCP - General (Family Medicine) Otelia Sergeant, NP (Inactive) as Nurse Practitioner (Endocrinology) Earna Coder, MD as Consulting Physician (Oncology)   Name of the patient: Brian Nielsen  [id number]  January 27, 1961   Date of visit: 09/19/23  Diagnosis- Anemia of CKD and Head and Neck Cancer  Chief complaint/ Reason for visit- Question re: care coordination  Heme/Onc history:  Oncology History   No history exists.    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 He has met with [facility] for treatment of newly diagnosed head and neck cancer. He is not going to do the trial that included pembro and lenvatinib but will proceed with concurrent chemo and radiation. He will be receiving daily radiation and weekly chemotherapy. Concerned about getting appts at [facility] and at [facility].    Review of systems- Review of Systems  Constitutional:  Negative for malaise/fatigue.  HENT:  Negative for  sore throat.   Musculoskeletal:  Negative for myalgias.  Psychiatric/Behavioral:  Negative for depression. The patient is nervous/anxious.      Allergies  Allergen Reactions   Duloxetine Other (See Comments)    "disoriented, confused, couldn't drive"   Gluten Meal     Celiac Disease   Codeine Rash    Past Medical History:  Diagnosis Date   Adult celiac disease 2015   followed by dr Servando Snare (GI)   Anemia associated with chronic renal failure    hematologist--- dr Sarajane Jews;  treated with iron infusions   Benign localized prostatic hyperplasia with lower urinary tract symptoms (LUTS)    urologist--- dr Retta Diones   Charcot foot due to diabetes mellitus (HCC)    Chronic constipation    CKD (chronic kidney disease), stage III [facility])    nephrologist--- dr Wolfgang Phoenix;  renal bx 11-20-2021   Diverticulosis of colon    Edema of both lower extremities    GERD (gastroesophageal reflux disease)    History of adenomatous polyp of colon    HTN (hypertension)    Hyperlipidemia, mixed    Peripheral neuropathy    Peyronie's disease    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 meatal stricture    Retinopathy due to secondary diabetes mellitus (HCC)    both eyes  treated with injecitons   Thoracic spondylosis without myelopathy 11/22/2016   Type 2 diabetes mellitus [facility])    endocrinologist--- dr nida   Vitamin D deficiency    Wears hearing aid in both ears     Past Surgical History:  Procedure Laterality Date   BIOPSY N/A 09/07/2021   Procedure: BIOPSY;  Surgeon: Midge Minium, MD;  Location: [facility];  Service: Endoscopy;  Laterality: N/A;   CATARACT EXTRACTION W/ INTRAOCULAR LENS IMPLANT Bilateral 2023   COLONOSCOPY N/A 05/03/2014   Procedure: COLONOSCOPY;  Surgeon: West Bali, MD;  [facility];  Service: Endoscopy;  Laterality: N/A;  12:00   COLONOSCOPY WITH PROPOFOL N/A 09/07/2021   Procedure: COLONOSCOPY WITH PROPOFOL;  Surgeon: Midge Minium, MD;  Location: [facility];  Service: Endoscopy;  Laterality: N/A;   CYSTOSCOPY WITH INJECTION N/A 07/26/2023   Procedure: CYSTOSCOPY WITH BOTOX INJECTION 100 UNITS;  Surgeon: Noel Christmas, MD;  Location: [facility] ORS;  Service: Urology;  Laterality: N/A;  30 minutes   CYSTOSCOPY WITH URETHRAL DILATATION N/A 01/10/2023   Procedure: CYSTOSCOPY;  Surgeon: Marcine Matar, MD;  Location: [facility];  Service: Urology;  Laterality: N/A;   ESOPHAGOGASTRODUODENOSCOPY N/A 05/03/2014   Procedure: ESOPHAGOGASTRODUODENOSCOPY (EGD);  Surgeon: West Bali, MD;  [facility];  Service: Endoscopy;  Laterality: N/A;   ESOPHAGOGASTRODUODENOSCOPY (EGD) WITH PROPOFOL N/A 06/03/2020   Procedure: ESOPHAGOGASTRODUODENOSCOPY (EGD) WITH PROPOFOL;  Surgeon: Regis Bill, MD;  Location: [facility] ENDOSCOPY;  Service: Endoscopy;  Laterality: N/A;   ESOPHAGOGASTRODUODENOSCOPY (EGD) WITH PROPOFOL N/A 09/07/2021   Procedure: ESOPHAGOGASTRODUODENOSCOPY (EGD) WITH PROPOFOL;  Surgeon: Midge Minium, MD;  Location: [facility];  Service: Endoscopy;  Laterality: N/A;  Diabetic   IR BONE MARROW BIOPSY & ASPIRATION  04/27/2023   MEATOTOMY N/A 01/10/2023   Procedure: MEATOTOMY ADULT;  Surgeon: Marcine Matar, MD;  Location: [facility];  Service: Urology;  Laterality: N/A;   PARS PLANA VITRECTOMY Right 03/21/2021   Procedure: PARS PLANA VITRECTOMY 25 GAUGE WITH INJECTION OF ANTIBIOTICS FOR ENDOPHTHALMITIS;  Surgeon: Carmela Rima, MD;  Location: [facility] OR;  Service: Ophthalmology;  Laterality: Right;   POLYPECTOMY N/A 09/07/2021   Procedure: POLYPECTOMY;  Surgeon: Midge Minium, MD;  Location: [facility];  Service: Endoscopy;  Laterality: N/A;   REPAIR EXTENSOR TENDON Right 08/11/2022   Procedure: EXTENSOR CARPI ULNARIS TENDON DEBRIDEMENT, POSSIBLE SUBSHEATH RECONSTRUCTION;  Surgeon: Marlyne Beards, MD;  Location: [facility];  Service: Orthopedics;  Laterality: Right;  or  regional plus MAC    Social History   Socioeconomic History   Marital status: Married    Spouse name: [name]   Number of children: 1   Years of education: 12   Highest education level: Not on file  Occupational History   Occupation: Architect: [employer]    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  Tobacco Use   Smoking status: Never   Smokeless tobacco: Current    Types: Snuff  Vaping Use   Vaping status: Never Used  Substance and Sexual Activity   Alcohol use: Not Currently    Comment: seldom   Drug use: Never   Sexual activity: Yes    Birth control/protection: Surgical  Other Topics Concern   Not on file  Social History Narrative   Lives with wife   [place]   Caffeine- diet Mtn Dew, 2 glasses   Social Determinants of Health   Financial Resource Strain: Low Risk  (02/08/2023)   Received from [facility], [facility]   Overall Financial Resource Strain (CARDIA)    Difficulty of Paying Living Expenses: Not hard at all  Food Insecurity: No Food Insecurity (02/08/2023)   Received from [facility], [facility]   Hunger Vital Sign    Worried About Running Out of  Food in the Last Year: Never true    Ran Out of Food in the Last Year: Never true  Transportation Needs: No Transportation Needs (02/08/2023)   Received from [facility], [facility]   PRAPARE - Transportation    Lack of Transportation (Medical): No    Lack of Transportation (Non-Medical): No  Physical Activity: Patient Declined (02/08/2023)   Received from [facility], [facility]   Exercise Vital Sign    Days of Exercise per Week: Patient declined    Minutes of Exercise per Session: Patient declined  Stress: No Stress Concern Present (02/08/2023)   Received from [facility], [facility] of Occupational Health - Occupational Stress Questionnaire    Feeling of Stress : Not at all  Social Connections: Socially Integrated  (02/08/2023)   Received from [facility], [facility]   Social Network    How would you rate your social network (family, work, friends)?: Good participation with social networks  Intimate Partner Violence: Not At Risk (02/08/2023)   Received from [facility], [facility]   HITS    Over the last 12 months how often did your partner physically hurt you?: 1    Over the last 12 months how often did your partner insult you or talk down to you?: 1    Over the last 12 months how often did your partner threaten you with physical harm?: 1    Over the last 12 months how often did your partner scream or curse at you?: 1    Family History  Problem Relation Age of Onset   Aneurysm Mother        brain   Hypertension Mother    Cancer Father    Heart disease Father    Diabetes Father    Hypertension Father    Hyperlipidemia Father    Cancer Maternal Grandmother        melanoma   Heart disease Paternal Grandfather    Colon cancer Neg Hx      Current Outpatient Medications:    amitriptyline (ELAVIL) 25 MG tablet, Take 25 mg by mouth at bedtime., Disp: , Rfl:    aspirin 81 MG EC tablet, Take 81 mg by mouth daily., Disp: , Rfl:    atorvastatin (LIPITOR) 40 MG tablet, Take 1 tablet (40 mg total) by mouth daily. (Patient taking differently: Take 40 mg by mouth daily.), Disp: 90 tablet, Rfl: 3   cloNIDine (CATAPRES) 0.2 MG tablet, Take 0.2 mg by mouth 2 (two) times daily., Disp: , Rfl:    furosemide (LASIX) 20 MG tablet, Take 40 mg by mouth 2 (two) times daily., Disp: , Rfl:    gabapentin (NEURONTIN) 300 MG capsule, Take 300 mg by mouth 3 times/day as needed-between meals & bedtime., Disp: , Rfl:    insulin glargine (LANTUS SOLOSTAR) 100 UNIT/ML Solostar Pen, Inject 35 Units into the skin 2 (two) times daily., Disp: , Rfl:    JARDIANCE 10 MG TABS tablet, Take 10 mg by mouth daily., Disp: , Rfl:    lisinopril (ZESTRIL) 20 MG tablet, Take 20 mg by mouth daily., Disp: , Rfl:    pantoprazole  (PROTONIX) 40 MG tablet, Take 1 tablet by mouth daily., Disp: , Rfl:    tirzepatide (MOUNJARO) 15 MG/0.5ML Pen, Inject 15 mg into the skin once a week., Disp: 2 mL, Rfl: 3  Physical exam: Exam limited due to telemedicine  There were no vitals filed for this visit. Physical Exam Pulmonary:  Effort: No respiratory distress.  Neurological:     Mental Status: He is alert and oriented to person, place, and time.  Psychiatric:        Behavior: Behavior normal.       Assessment and plan- Patient is a 62 y.o. male    1) Anemia- we have been managing patient's anemia at [facility] with EPO and iron infusion. He will be receiving care at [facility] for head and neck cancer including chemotherapy and daily radiation. I've asked him to reach out to his [facility] onc team to see if they'd be able to manage his blood counts. If so, we can hold off on appts at [facility] until he completes treatments at [facility]. His wife will reach out to [facility] to clarify and follow up with [place]. For now, we'll keep appts as scheduled.   Disposition:  F/u as scheduled- la  Visit Diagnosis 1. Symptomatic anemia    Patient expressed understanding and was in agreement with this plan. He also understands that He can call clinic at any time with any questions, concerns, or complaints.   I discussed the assessment and treatment plan with the patient. The patient was provided an opportunity to ask questions and all were answered. The patient agreed with the plan and demonstrated an understanding of the instructions.   The patient was advised to call back or seek an in-person evaluation if the symptoms worsen or if the condition fails to improve as anticipated.   I spent 15 minutes on this telephone encounter.   Thank you for allowing me to participate in the care of this very pleasant patient.   Consuello Masse, DNP, AGNP-C [facility]  CC: Dr Donneta Romberg

## 2023-09-23 ENCOUNTER — Inpatient Hospital Stay: Payer: BC Managed Care – PPO

## 2023-09-26 ENCOUNTER — Inpatient Hospital Stay: Payer: BC Managed Care – PPO

## 2023-09-26 VITALS — BP 137/70 | HR 67

## 2023-09-26 DIAGNOSIS — D649 Anemia, unspecified: Secondary | ICD-10-CM

## 2023-09-26 DIAGNOSIS — E611 Iron deficiency: Secondary | ICD-10-CM | POA: Diagnosis not present

## 2023-09-26 DIAGNOSIS — N183 Chronic kidney disease, stage 3 unspecified: Secondary | ICD-10-CM | POA: Diagnosis present

## 2023-09-26 DIAGNOSIS — I129 Hypertensive chronic kidney disease with stage 1 through stage 4 chronic kidney disease, or unspecified chronic kidney disease: Secondary | ICD-10-CM | POA: Diagnosis present

## 2023-09-26 DIAGNOSIS — E1122 Type 2 diabetes mellitus with diabetic chronic kidney disease: Secondary | ICD-10-CM | POA: Diagnosis present

## 2023-09-26 DIAGNOSIS — D631 Anemia in chronic kidney disease: Secondary | ICD-10-CM | POA: Diagnosis not present

## 2023-09-26 LAB — HEMOGLOBIN AND HEMATOCRIT (CANCER CENTER ONLY)
HCT: 29.8 % — ABNORMAL LOW (ref 39.0–52.0)
Hemoglobin: 9.4 g/dL — ABNORMAL LOW (ref 13.0–17.0)

## 2023-09-26 MED ORDER — EPOETIN ALFA-EPBX 20000 UNIT/ML IJ SOLN
20000.0000 [IU] | Freq: Once | INTRAMUSCULAR | Status: AC
  Administered 2023-09-26: 20000 [IU] via SUBCUTANEOUS
  Filled 2023-09-26: qty 1

## 2023-09-30 ENCOUNTER — Other Ambulatory Visit: Payer: Self-pay

## 2023-09-30 ENCOUNTER — Encounter: Payer: Self-pay | Admitting: Gastroenterology

## 2023-09-30 MED ORDER — LINACLOTIDE 290 MCG PO CAPS: 290.0000 ug | ORAL_CAPSULE | Freq: Every day | ORAL | 6 refills | Status: DC

## 2023-10-03 ENCOUNTER — Other Ambulatory Visit (HOSPITAL_COMMUNITY): Payer: Self-pay

## 2023-10-03 ENCOUNTER — Other Ambulatory Visit: Payer: Self-pay

## 2023-10-03 ENCOUNTER — Ambulatory Visit (HOSPITAL_BASED_OUTPATIENT_CLINIC_OR_DEPARTMENT_OTHER): Payer: BC Managed Care – PPO | Admitting: Pulmonary Disease

## 2023-10-04 NOTE — Telephone Encounter (Signed)
Request Reference Number: [id number]. [employer] CAP is approved through 10/03/2024.

## 2023-10-07 ENCOUNTER — Telehealth (HOSPITAL_BASED_OUTPATIENT_CLINIC_OR_DEPARTMENT_OTHER): Payer: BC Managed Care – PPO | Admitting: Pulmonary Disease

## 2023-10-07 ENCOUNTER — Encounter (HOSPITAL_BASED_OUTPATIENT_CLINIC_OR_DEPARTMENT_OTHER): Payer: Self-pay | Admitting: Pulmonary Disease

## 2023-10-07 VITALS — Ht 68.0 in | Wt 185.0 lb

## 2023-10-07 DIAGNOSIS — G4710 Hypersomnia, unspecified: Secondary | ICD-10-CM

## 2023-10-07 DIAGNOSIS — G4733 Obstructive sleep apnea (adult) (pediatric): Secondary | ICD-10-CM

## 2023-10-07 NOTE — Progress Notes (Addendum)
   Subjective:    Patient ID: Brian Nielsen, male    DOB: 1960-12-29, 62 y.o.   MRN: [id number]  HPI   I connected with  TELL DEMCHAK on 10/07/23 by video enabled telemedicine application and verified that I am speaking with the correct person using two identifiers.     Location: Patient: Home Provider: Office - [facility] - [employer]   I discussed the limitations of evaluation and management by telemedicine and the availability of in person appointments. The patient expressed understanding and agreed to proceed. I also discussed with the patient that there may be a patient responsible charge related to this service. The patient expressed understanding and agreed to proceed.   Patient consented to consult via telephone: Yes People present and their role in pt care: Pt   62 year old for follow-up of OSA   PMH - GERD  Hypertension Diabetes for 20 years CKD  Initial OV was 12/2022.  We performed home sleep test which showed moderate OSA.  Due to his previous CPAP intolerance, we will proceed with CPAP titration study which showed requirement of 14 cm. We placed him on auto CPAP 10 to 14 cm. Unfortunately he was diagnosed with throat cancer 07/2023 and is undergoing RT and chemotherapy at [facility].  He has developed oral thrush and this is bothering him History corroborated by wife, he reports increased tiredness and sleepiness in the daytime  Review of meds shows gabapentin and amitriptyline that he has been on for many years for neuropathy, clonidine has been recently added for hypertension He reports sleeping on his side and some air leakage into his eyes.  He would like to know if CPAP is helping him  Significant tests/ events reviewed  HST 02/2023 showed moderate OSA with AHI 16/ hr, low sat 70% (Snap)  CPAP titration 04/2023 >> 14 cm   Review of Systems neg for any significant sore throat, dysphagia, itching, sneezing, nasal congestion or excess/ purulent  secretions, fever, chills, sweats, unintended wt loss, pleuritic or exertional cp, hempoptysis, orthopnea pnd or change in chronic leg swelling. Also denies presyncope, palpitations, heartburn, abdominal pain, nausea, vomiting, diarrhea or change in bowel or urinary habits, dysuria,hematuria, rash, arthralgias, visual complaints, headache, numbness weakness or ataxia.     Objective:   Physical Exam  Alert, interactive No accessory muscle use      Assessment & Plan:    OSA on CPAP -CPAP report was reviewed which shows good control of events on auto settings 10 to 14 cm with average pressure of 13 cm and mild leak.  His compliance is good with average usage 5 hours and a few missed nights.  Residual events are few. Unfortunately he still has residual hypersomnolence - Trial of extra small airfit F30 mask to [employer]  If that does not work, please call [phone] for a mask fitting appt  Hypersomnolence -is multifactorial and may be related to sedative meds such as clonidine, gabapentin especially in the context of CKD.  Also may be related to his ongoing cancer treatment with radiation and chemotherapy If this persist we could consider adding a stimulant but I asked him to explore with his other doctors if the other medications can be decreased   Total encounter time was 29 minutes  Miyana Mordecai V. Vassie Loll MD

## 2023-10-07 NOTE — Patient Instructions (Addendum)
DIscuss meds with PCP/ renal doctor  -gabapentin -clonidine -amitryptiline  - Trial of extra small airfit F30 mask to [employer]  If that does not work, please call [phone] for a mask fitting appt

## 2023-10-09 ENCOUNTER — Encounter (HOSPITAL_BASED_OUTPATIENT_CLINIC_OR_DEPARTMENT_OTHER): Payer: Self-pay | Admitting: Pulmonary Disease

## 2023-10-10 ENCOUNTER — Inpatient Hospital Stay: Payer: BC Managed Care – PPO

## 2023-10-10 ENCOUNTER — Encounter: Payer: Self-pay | Admitting: Internal Medicine

## 2023-10-10 ENCOUNTER — Inpatient Hospital Stay (HOSPITAL_BASED_OUTPATIENT_CLINIC_OR_DEPARTMENT_OTHER): Payer: BC Managed Care – PPO | Admitting: Internal Medicine

## 2023-10-10 VITALS — BP 167/77 | HR 62 | Temp 97.0°F | Resp 17

## 2023-10-10 VITALS — BP 164/83 | HR 63 | Temp 97.1°F | Resp 16 | Wt 183.0 lb

## 2023-10-10 DIAGNOSIS — D649 Anemia, unspecified: Secondary | ICD-10-CM

## 2023-10-10 DIAGNOSIS — D509 Iron deficiency anemia, unspecified: Secondary | ICD-10-CM | POA: Diagnosis not present

## 2023-10-10 DIAGNOSIS — N1832 Chronic kidney disease, stage 3b: Secondary | ICD-10-CM | POA: Diagnosis not present

## 2023-10-10 DIAGNOSIS — D631 Anemia in chronic kidney disease: Secondary | ICD-10-CM | POA: Diagnosis not present

## 2023-10-10 DIAGNOSIS — I129 Hypertensive chronic kidney disease with stage 1 through stage 4 chronic kidney disease, or unspecified chronic kidney disease: Secondary | ICD-10-CM | POA: Diagnosis not present

## 2023-10-10 LAB — IRON AND TIBC
Iron: 119 ug/dL (ref 45–182)
Saturation Ratios: 49 % — ABNORMAL HIGH (ref 17.9–39.5)
TIBC: 242 ug/dL — ABNORMAL LOW (ref 250–450)
UIBC: 123 ug/dL

## 2023-10-10 LAB — FERRITIN: Ferritin: 702 ng/mL — ABNORMAL HIGH (ref 24–336)

## 2023-10-10 LAB — CBC WITH DIFFERENTIAL (CANCER CENTER ONLY)
Abs Immature Granulocytes: 0.01 10*3/uL (ref 0.00–0.07)
Basophils Absolute: 0 10*3/uL (ref 0.0–0.1)
Basophils Relative: 1 %
Eosinophils Absolute: 0 10*3/uL (ref 0.0–0.5)
Eosinophils Relative: 1 %
HCT: 32.1 % — ABNORMAL LOW (ref 39.0–52.0)
Hemoglobin: 10.1 g/dL — ABNORMAL LOW (ref 13.0–17.0)
Immature Granulocytes: 0 %
Lymphocytes Relative: 8 %
Lymphs Abs: 0.3 10*3/uL — ABNORMAL LOW (ref 0.7–4.0)
MCH: 29.2 pg (ref 26.0–34.0)
MCHC: 31.5 g/dL (ref 30.0–36.0)
MCV: 92.8 fL (ref 80.0–100.0)
Monocytes Absolute: 0.4 10*3/uL (ref 0.1–1.0)
Monocytes Relative: 10 %
Neutro Abs: 3 10*3/uL (ref 1.7–7.7)
Neutrophils Relative %: 80 %
Platelet Count: 152 10*3/uL (ref 150–400)
RBC: 3.46 MIL/uL — ABNORMAL LOW (ref 4.22–5.81)
RDW: 14.8 % (ref 11.5–15.5)
WBC Count: 3.7 10*3/uL — ABNORMAL LOW (ref 4.0–10.5)
nRBC: 0 % (ref 0.0–0.2)

## 2023-10-10 LAB — BASIC METABOLIC PANEL - CANCER CENTER ONLY
Anion gap: 10 (ref 5–15)
BUN: 41 mg/dL — ABNORMAL HIGH (ref 8–23)
CO2: 22 mmol/L (ref 22–32)
Calcium: 8 mg/dL — ABNORMAL LOW (ref 8.9–10.3)
Chloride: 105 mmol/L (ref 98–111)
Creatinine: 2.06 mg/dL — ABNORMAL HIGH (ref 0.61–1.24)
GFR, Estimated: 36 mL/min — ABNORMAL LOW (ref >60)
Glucose, Bld: 239 mg/dL — ABNORMAL HIGH (ref 70–99)
Potassium: 4.6 mmol/L (ref 3.5–5.1)
Sodium: 137 mmol/L (ref 135–145)

## 2023-10-10 MED ORDER — SODIUM CHLORIDE 0.9% FLUSH
10.0000 mL | Freq: Once | INTRAVENOUS | Status: AC | PRN
  Administered 2023-10-10: 10 mL
  Filled 2023-10-10: qty 10

## 2023-10-10 MED ORDER — IRON SUCROSE 20 MG/ML IV SOLN
200.0000 mg | Freq: Once | INTRAVENOUS | Status: AC
  Administered 2023-10-10: 200 mg via INTRAVENOUS
  Filled 2023-10-10: qty 10

## 2023-10-10 NOTE — Progress Notes (Signed)
Pt in for follow up, radiation today, reports having sore throat and thrush, taking lidocaine swish and swallow and diflucan.  States decreased intake due to sore throat and is supplementing with two protein type shakes daily. Pt also reports he also has pain in feet bilaterally.

## 2023-10-10 NOTE — Progress Notes (Signed)
[facility] CONSULT NOTE  Patient Care Team: Chyrl Civatte, FNP as PCP - General (Family Medicine) Otelia Sergeant, NP (Inactive) as Nurse Practitioner (Endocrinology) Earna Coder, MD as Consulting Physician (Oncology)  CHIEF COMPLAINTS/PURPOSE OF CONSULTATION: ANEMIA   HEMATOLOGY HISTORY:  # ANEMIA PROGRESSIVE since 2020 12; 2022- 10 MCV- 90s; colonoscopy 2015; multiple endoscopies-last September 2021-biopsy active celiac disease; MAY 2024- Bone marrow biopsy/aspiration- The bone marrow is generally normocellular for age with trilineage  hematopoiesis and nonspecific changes, likely secondary in nature in  this setting.  There is no morphologic evidence of a lymphoproliferative  process or plasma cell neoplasm.   #CKD stage III [[facility]]/diabetes [[facility]]; ? Celaic disease [[facility]];    - Labs: 07/2014 - tTG IgA 101 03/2020 - positive endomysial Ab IgA, tTG IgA 78 - CSY: 07/2014 - normal appearing terminal ileum, sigmoid diverticulosis, and non-bleeding internal hemorrhoids - EGD: 07/2014 - mild non-erosive gastritis, normal appearing duodenal mucosa with duodenal biopsies showing celiac sprue - EGD: 06/03/2020 - procedure aborted due to presence of food - EGD: 08/12/2020 - irregular Z-line found at [facility] with bx showing mild reflux esophagitis, 1 cm hiatal hernia, mild chronic gastritis, decreased folds found in entire duodenum, flattening found in entire duodenum and scalloped mucosa with bx showing active Celiac disease with villous atrophy, increased intraepithelial lymphocytes, etc. Repeat in 1-year.  HISTORY OF PRESENTING ILLNESS: Alone.  Ambulating independently.  Brian Nielsen 62 y.o.  male symptomatic anemia-likely secondary to chronic renal disease-IDA / poorly controlled diabetes-complications of CKD; peripheral neuropathy; and also newly diagnosed p16 positive tonsil cancer is here for follow-up.  Patient is currently undergoing definitive  chemoradiation at [facility] for his tonsil cancer.  C/o right flank/side pain, 6/10- patient had a recent history of shingles. Currently resolved.  Does have some trouble sleeping, now has a cpap.   Patient continues to complain of pain in right foot.   Otherwise no nausea no vomiting.   Review of Systems  Constitutional:  Positive for malaise/fatigue and weight loss. Negative for chills, diaphoresis and fever.  HENT:  Negative for nosebleeds and sore throat.   Eyes:  Negative for double vision.  Respiratory:  Positive for shortness of breath. Negative for cough, hemoptysis, sputum production and wheezing.   Cardiovascular:  Negative for chest pain, palpitations, orthopnea and leg swelling.  Gastrointestinal:  Negative for blood in stool, heartburn, melena, nausea and vomiting.  Genitourinary:  Negative for dysuria, frequency and urgency.  Musculoskeletal:  Positive for back pain and joint pain.  Skin: Negative.  Negative for itching and rash.  Neurological:  Positive for weakness. Negative for dizziness, tingling, focal weakness and headaches.  Endo/Heme/Allergies:  Does not bruise/bleed easily.  Psychiatric/Behavioral:  Negative for depression. The patient is not nervous/anxious and does not have insomnia.     MEDICAL HISTORY:  Past Medical History:  Diagnosis Date   Adult celiac disease 2015   followed by dr Servando Snare (GI)   Anemia associated with chronic renal failure    hematologist--- dr Sarajane Jews;  treated with iron infusions   Benign localized prostatic hyperplasia with lower urinary tract symptoms (LUTS)    urologist--- dr Retta Diones   Charcot foot due to diabetes mellitus (HCC)    Chronic constipation    CKD (chronic kidney disease), stage III [facility])    nephrologist--- dr Wolfgang Phoenix;  renal bx 11-20-2021   Diverticulosis of colon    Edema of both lower extremities    GERD (gastroesophageal reflux disease)  History of adenomatous polyp of colon    HTN (hypertension)     Hyperlipidemia, mixed    Peripheral neuropathy    Peyronie's disease    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 meatal stricture    Retinopathy due to secondary diabetes mellitus (HCC)    both eyes  treated with injecitons   Thoracic spondylosis without myelopathy 11/22/2016   Type 2 diabetes mellitus [facility])    endocrinologist--- dr nida   Vitamin D deficiency    Wears hearing aid in both ears     SURGICAL HISTORY: Past Surgical History:  Procedure Laterality Date   BIOPSY N/A 09/07/2021   Procedure: BIOPSY;  Surgeon: Midge Minium, MD;  Location: [facility];  Service: Endoscopy;  Laterality: N/A;   CATARACT EXTRACTION W/ INTRAOCULAR LENS IMPLANT Bilateral 2023   COLONOSCOPY N/A 05/03/2014   Procedure: COLONOSCOPY;  Surgeon: West Bali, MD;  Location: [facility];  Service: Endoscopy;  Laterality: N/A;  12:00   COLONOSCOPY WITH PROPOFOL N/A 09/07/2021   Procedure: COLONOSCOPY WITH PROPOFOL;  Surgeon: Midge Minium, MD;  Location: [facility];  Service: Endoscopy;  Laterality: N/A;   CYSTOSCOPY WITH INJECTION N/A 07/26/2023   Procedure: CYSTOSCOPY WITH BOTOX INJECTION 100 UNITS;  Surgeon: Noel Christmas, MD;  Location: [facility] ORS;  Service: Urology;  Laterality: N/A;  30 minutes   CYSTOSCOPY WITH URETHRAL DILATATION N/A 01/10/2023   Procedure: CYSTOSCOPY;  Surgeon: Marcine Matar, MD;  Location: [facility];  Service: Urology;  Laterality: N/A;   ESOPHAGOGASTRODUODENOSCOPY N/A 05/03/2014   Procedure: ESOPHAGOGASTRODUODENOSCOPY (EGD);  Surgeon: West Bali, MD;  [facility];  Service: Endoscopy;  Laterality: N/A;   ESOPHAGOGASTRODUODENOSCOPY (EGD) WITH PROPOFOL N/A 06/03/2020   Procedure: ESOPHAGOGASTRODUODENOSCOPY (EGD) WITH PROPOFOL;  Surgeon: Regis Bill, MD;  Location: [facility] ENDOSCOPY;  Service: Endoscopy;  Laterality: N/A;   ESOPHAGOGASTRODUODENOSCOPY (EGD) WITH PROPOFOL N/A 09/07/2021   Procedure: ESOPHAGOGASTRODUODENOSCOPY (EGD) WITH  PROPOFOL;  Surgeon: Midge Minium, MD;  Location: [facility];  Service: Endoscopy;  Laterality: N/A;  Diabetic   IR BONE MARROW BIOPSY & ASPIRATION  04/27/2023   MEATOTOMY N/A 01/10/2023   Procedure: MEATOTOMY ADULT;  Surgeon: Marcine Matar, MD;  Location: [facility];  Service: Urology;  Laterality: N/A;   PARS PLANA VITRECTOMY Right 03/21/2021   Procedure: PARS PLANA VITRECTOMY 25 GAUGE WITH INJECTION OF ANTIBIOTICS FOR ENDOPHTHALMITIS;  Surgeon: Carmela Rima, MD;  Location: [facility] OR;  Service: Ophthalmology;  Laterality: Right;   POLYPECTOMY N/A 09/07/2021   Procedure: POLYPECTOMY;  Surgeon: Midge Minium, MD;  Location: [facility];  Service: Endoscopy;  Laterality: N/A;   REPAIR EXTENSOR TENDON Right 08/11/2022   Procedure: EXTENSOR CARPI ULNARIS TENDON DEBRIDEMENT, POSSIBLE SUBSHEATH RECONSTRUCTION;  Surgeon: Marlyne Beards, MD;  Location: [facility];  Service: Orthopedics;  Laterality: Right;  or regional plus MAC    SOCIAL HISTORY: Social History   Socioeconomic History   Marital status: Married    Spouse name: [name]   Number of children: 1   Years of education: 12   Highest education level: Not on file  Occupational History   Occupation: Architect: [employer]    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  Tobacco Use   Smoking status: Never   Smokeless tobacco: Current    Types: Snuff  Vaping Use   Vaping status: Never Used  Substance and Sexual Activity   Alcohol use: Not Currently    Comment: seldom   Drug use: Never   Sexual  activity: Yes    Birth control/protection: Surgical  Other Topics Concern   Not on file  Social History Narrative   Lives with wife   [place]   Caffeine- diet Mtn Dew, 2 glasses   Social Determinants of Health   Financial Resource Strain: Low Risk  (02/08/2023)   Received from [facility]   Overall Financial Resource Strain (CARDIA)     Difficulty of Paying Living Expenses: Not hard at all  Food Insecurity: No Food Insecurity (02/08/2023)   Received from [facility], [facility]   Hunger Vital Sign    Worried About Running Out of Food in the Last Year: Never true    Ran Out of Food in the Last Year: Never true  Transportation Needs: No Transportation Needs (02/08/2023)   Received from [facility], [facility]   PRAPARE - Transportation    Lack of Transportation (Medical): No    Lack of Transportation (Non-Medical): No  Physical Activity: Patient Declined (02/08/2023)   Received from [facility], [facility]   Exercise Vital Sign    Days of Exercise per Week: Patient declined    Minutes of Exercise per Session: Patient declined  Stress: No Stress Concern Present (02/08/2023)   Received from [facility] - Occupational Stress Questionnaire    Feeling of Stress : Not at all  Social Connections: Socially Integrated (02/08/2023)   Received from [facility], [facility]   Social Network    How would you rate your social network (family, work, friends)?: Good participation with social networks  Intimate Partner Violence: Not At Risk (02/08/2023)   Received from [facility], [facility]   HITS    Over the last 12 months how often did your partner physically hurt you?: Never    Over the last 12 months how often did your partner insult you or talk down to you?: Never    Over the last 12 months how often did your partner threaten you with physical harm?: Never    Over the last 12 months how often did your partner scream or curse at you?: Never    FAMILY HISTORY: Family History  Problem Relation Age of Onset   Aneurysm Mother        brain   Hypertension Mother    Cancer Father    Heart disease Father    Diabetes Father    Hypertension Father    Hyperlipidemia Father    Cancer Maternal Grandmother        melanoma   Heart disease Paternal  Grandfather    Colon cancer Neg Hx     ALLERGIES:  is allergic to duloxetine, gluten meal, and codeine.  MEDICATIONS:  Current Outpatient Medications  Medication Sig Dispense Refill   amitriptyline (ELAVIL) 25 MG tablet Take 25 mg by mouth at bedtime.     aspirin 81 MG EC tablet Take 81 mg by mouth daily.     atorvastatin (LIPITOR) 40 MG tablet Take 1 tablet (40 mg total) by mouth daily. (Patient taking differently: Take 40 mg by mouth daily.) 90 tablet 3   cloNIDine (CATAPRES) 0.2 MG tablet Take 0.2 mg by mouth 2 (two) times daily.     DULoxetine (CYMBALTA) 20 MG capsule Take by mouth.     furosemide (LASIX) 20 MG tablet Take 40 mg by mouth 2 (two) times daily.     gabapentin (NEURONTIN) 300 MG capsule Take 300 mg  by mouth 3 times/day as needed-between meals & bedtime.     insulin glargine (LANTUS SOLOSTAR) 100 UNIT/ML Solostar Pen Inject 35 Units into the skin 2 (two) times daily.     JARDIANCE 10 MG TABS tablet Take 10 mg by mouth daily.     lidocaine (XYLOCAINE) 2 % solution Mix equal parts diphenhydramine, lidocaine, and liquid antacid. Swish 5 to in mouth for 60 seconds, every 4 to 6 hours as needed, then swallow or spit mixture out (as directed by prescriber). Shake well before using. Refrigerate after mixing and discard after 14 days.     linaclotide (LINZESS) 290 MCG CAPS capsule Take 1 capsule (290 mcg total) by mouth daily before breakfast. 30 capsule 6   lisinopril (ZESTRIL) 20 MG tablet Take 20 mg by mouth daily.     ondansetron (ZOFRAN) 8 MG tablet Take by mouth.     oxyCODONE (OXY IR/ROXICODONE) 5 MG immediate release tablet Take by mouth.     oxyCODONE ER 9 MG C12A Take by mouth.     pantoprazole (PROTONIX) 40 MG tablet Take 1 tablet by mouth daily.     prochlorperazine (COMPAZINE) 10 MG tablet Take by mouth.     tirzepatide (MOUNJARO) 15 MG/0.5ML Pen Inject 15 mg into the skin once a week. 2 mL 3   No current facility-administered medications for this visit.    Facility-Administered Medications Ordered in Other Visits  Medication Dose Route Frequency Provider Last Rate Last Admin   iron sucrose (VENOFER) injection 200 mg  200 mg Intravenous Once Alinda Dooms, NP       sodium chloride flush (NS) 0.9 % injection 10 mL  10 mL Intracatheter Once PRN Earna Coder, MD          PHYSICAL EXAMINATION:  S/p Suprapubic catheter. 2cm LN in right neck; Right enlarged tonsil.  Vitals:   10/10/23 1520  BP: (!) 164/83  Pulse: 63  Resp: 16  Temp: (!) 97.1 F (36.2 C)  SpO2: 98%     Filed Weights   10/10/23 1520  Weight: 183 lb (83 kg)      Physical Exam Vitals and nursing note reviewed.  Constitutional:      Comments:    HENT:     Head: Normocephalic and atraumatic.     Mouth/Throat:     Pharynx: Oropharynx is clear.  Eyes:     Extraocular Movements: Extraocular movements intact.     Pupils: Pupils are equal, round, and reactive to light.  Cardiovascular:     Rate and Rhythm: Normal rate and regular rhythm.  Pulmonary:     Comments: Decreased breath sounds bilaterally.  Abdominal:     Palpations: Abdomen is soft.  Musculoskeletal:        General: Normal range of motion.     Cervical back: Normal range of motion.  Skin:    General: Skin is warm.  Neurological:     General: No focal deficit present.     Mental Status: He is alert and oriented to person, place, and time.  Psychiatric:        Behavior: Behavior normal.        Judgment: Judgment normal.     LABORATORY DATA:  I have reviewed the data as listed Lab Results  Component Value Date   WBC 3.7 (L) 10/10/2023   HGB 10.1 (L) 10/10/2023   HCT 32.1 (L) 10/10/2023   MCV 92.8 10/10/2023   PLT 152 10/10/2023   Recent Labs  02/02/23 1441 02/03/23 1849 04/01/23 1433 05/02/23 1448 08/05/23 1521 09/09/23 1345 10/10/23 1442  NA 134*   < > 139   < > 138 137 137  K 3.6   < > 3.9   < > 3.9 5.5* 4.6  CL 105   < > 110   < > 107 108 105  CO2 23   < > 21*    < > 23 23 22   GLUCOSE 295*   < > 227*   < > 145* 182* 239*  BUN 31*   < > 32*   < > 35* 36* 41*  CREATININE 1.87*   < > 2.07*   < > 2.46* 2.03* 2.06*  CALCIUM 8.4*   < > 8.3*   < > 8.3* 8.6* 8.0*  GFRNONAA 40*   < > 36*   < > 29* 37* 36*  PROT 6.0*  --  6.1*  --   --   --   --   ALBUMIN 3.1*  --  3.1*  --   --   --   --   AST 22  --  23  --   --   --   --   ALT 16  --  20  --   --   --   --   ALKPHOS 62  --  83  --   --   --   --   BILITOT 0.4  --  0.8  --   --   --   --    < > = values in this interval not displayed.     No results found.  Symptomatic anemia #Anemia-symptomatic fatigue hemoglobin 9-10--likely multifactorial-iron malabsorption/celiac disease/CKD-stage MAY 2024- Bone marrow biopsy/aspiration- The bone marrow is generally normocellular for age with trilineage  hematopoiesis and nonspecific changes, likely secondary in nature in  this setting.  There is no morphologic evidence of a lymphoproliferative  process or plasma cell neoplasm.   # Hemoglobin today is 10.3 on Venofer/Retacrit- SEP 2024-iron saturation- 24; ferritin 200;  HOLD further retacrit  as patient is undergoing active chemotherapy for curable cancer.  For now  proceed with venofer today  # 09/07/2023: Stage II (cT3, cN1, cM0, p16+)  -currently undergoing definitive chemoradiation with CarboTaxol weekly [[facility]]- defer to [facility] for further management.  # Ckd stage III [nephro, [place]]-diabetes- GFR- 40s- stable.    # Diabetes [[facility] Hb A1c- 7.4]-PBG 289  [steroid injectio]-continue follow-up with endocrinology/monitoring of blood sugars. stable  # DISPOSITION: # ok with Venofer today; NO retacrit # follow up in 5 weeks- APP ; labs- cbc/bmp;possible venofer- # follow up in 9 weeks-; MD;  labs- cbc/bmp; ;possible venofer-  -Dr.B  All questions were answered. The patient knows to call the clinic with any problems, questions or concerns.    Earna Coder, MD 10/10/2023 3:56 PM

## 2023-10-10 NOTE — Assessment & Plan Note (Addendum)
#  Anemia-symptomatic fatigue hemoglobin 9-10--likely multifactorial-iron malabsorption/celiac disease/CKD-stage MAY 2024- Bone marrow biopsy/aspiration- The bone marrow is generally normocellular for age with trilineage  hematopoiesis and nonspecific changes, likely secondary in nature in  this setting.  There is no morphologic evidence of a lymphoproliferative  process or plasma cell neoplasm.   # Hemoglobin today is 10.3 on Venofer/Retacrit- SEP 2024-iron saturation- 24; ferritin 200;  HOLD further retacrit  as patient is undergoing active chemotherapy for curable cancer.  For now  proceed with venofer today  # 09/07/2023: Stage II (cT3, cN1, cM0, p16+)  -currently undergoing definitive chemoradiation with CarboTaxol weekly [[facility]]- defer to [facility] for further management.  # Ckd stage III [nephro, [place]]-diabetes- GFR- 40s- stable.    # Diabetes [[facility] Hb A1c- 7.4]-PBG 289  [steroid injectio]-continue follow-up with endocrinology/monitoring of blood sugars. stable  # DISPOSITION: # ok with Venofer today; NO retacrit # follow up in 5 weeks- APP ; labs- cbc/bmp;possible venofer- # follow up in 9 weeks-; MD;  labs- cbc/bmp; ;possible venofer-  -Dr.B

## 2023-10-18 ENCOUNTER — Ambulatory Visit: Payer: BC Managed Care – PPO | Admitting: Gastroenterology

## 2023-10-24 ENCOUNTER — Encounter: Payer: Self-pay | Admitting: Nurse Practitioner

## 2023-10-24 ENCOUNTER — Encounter: Payer: Self-pay | Admitting: Internal Medicine

## 2023-10-24 DIAGNOSIS — C109 Malignant neoplasm of oropharynx, unspecified: Secondary | ICD-10-CM | POA: Insufficient documentation

## 2023-10-31 ENCOUNTER — Telehealth: Payer: Self-pay | Admitting: *Deleted

## 2023-10-31 ENCOUNTER — Other Ambulatory Visit (HOSPITAL_COMMUNITY): Payer: Self-pay

## 2023-10-31 NOTE — Telephone Encounter (Signed)
Patient said that he is getting infusion and injection here and that he is only at [facility] for a short time and they deferred to Dr B to decide

## 2023-10-31 NOTE — Telephone Encounter (Signed)
Patient wife called asking to speak with doctor regarding patient low blood counts and the Rectacrit  he gets. Please return her call.  CBC w/ Differential Order: [id number] Component Ref Range & Units 10 d ago  WBC 3.6 - 11.2 10*9/L 3.4 Low   RBC 4.26 - 5.60 10*12/L 3.35 Low   HGB 12.9 - 16.5 g/dL 10 Low   HCT 38.7 - 56.4 % 29.4 Low   MCV 77.6 - 95.7 fL 87.8  MCH 25.9 - 32.4 pg 29.7  MCHC 32.0 - 36.0 g/dL 33.2  RDW 95.1 - 88.4 % 15.8 High   MPV 6.8 - 10.7 fL 7.4  Platelet 150 - 450 10*9/L 104 Low   Neutrophils % % 86.8  Lymphocytes % % 5.4  Monocytes % % 5.9  Eosinophils % % 1.1  Basophils % % 0.8  Absolute Neutrophils 1.8 - 7.8 10*9/L 2.9  Absolute Lymphocytes 1.1 - 3.6 10*9/L 0.2 Low   Absolute Monocytes 0.3 - 0.8 10*9/L 0.2 Low   Absolute Eosinophils 0.0 - 0.5 10*9/L 0  Absolute Basophils 0.0 - 0.1 10*9/L 0  Resulting Agency [facility]ATOR[facility]   Specimen Collected: 10/21/23 10:04   Performed by: [facility] Last Resulted: 10/21/23 10:31  Received From: [facility]  Result Received: 10/24/23 14:03

## 2023-10-31 NOTE — Telephone Encounter (Signed)
He had more labs drawn 12/12 and his kidney doctor wants Dr B to decide if patient needs Retacrit  CBC w/ Differential Specimen: Blood Component Ref Range & Units 4 d ago  WBC 3.6 - 11.2 10*9/L 1.6 Low   RBC 4.26 - 5.60 10*12/L 3.17 Low   HGB 12.9 - 16.5 g/dL 9.3 Low   HCT 16.1 - 09.6 % 27.4 Low   MCV 77.6 - 95.7 fL 86.5  MCH 25.9 - 32.4 pg 29.4  MCHC 32.0 - 36.0 g/dL 34  RDW 04.5 - 40.9 % 16 High   MPV 6.8 - 10.7 fL 7.2  Platelet 150 - 450 10*9/L 76 Low   Neutrophils % % 83.1  Lymphocytes % % 8.9  Monocytes % % 5.4  Eosinophils % % 1.6  Basophils % % 1  Absolute Neutrophils 1.8 - 7.8 10*9/L 1.3 Low   Absolute Lymphocytes 1.1 - 3.6 10*9/L 0.1 Low   Absolute Monocytes 0.3 - 0.8 10*9/L 0.1 Low   Absolute Eosinophils 0.0 - 0.5 10*9/L 0  Absolute Basophils 0.0 - 0.1 10*9/L 0  Resulting Agency [facility] CLINICAL LABORATORIES  Specimen Collected: 10/27/23 11:10   Performed by: [facility] Last Resulted: 10/27/23 11:31

## 2023-11-01 ENCOUNTER — Encounter: Payer: Self-pay | Admitting: Internal Medicine

## 2023-11-06 ENCOUNTER — Inpatient Hospital Stay (HOSPITAL_COMMUNITY)
Admission: EM | Admit: 2023-11-06 | Discharge: 2023-11-14 | DRG: 871 | Disposition: A | Discharge status: Home Health | Payer: BC Managed Care – PPO | Attending: Internal Medicine | Admitting: Internal Medicine

## 2023-11-06 ENCOUNTER — Encounter (HOSPITAL_COMMUNITY): Payer: Self-pay

## 2023-11-06 ENCOUNTER — Emergency Department (HOSPITAL_COMMUNITY): Payer: BC Managed Care – PPO

## 2023-11-06 ENCOUNTER — Other Ambulatory Visit: Payer: Self-pay

## 2023-11-06 ENCOUNTER — Inpatient Hospital Stay (HOSPITAL_COMMUNITY): Payer: BC Managed Care – PPO

## 2023-11-06 DIAGNOSIS — R1310 Dysphagia, unspecified: Secondary | ICD-10-CM | POA: Diagnosis not present

## 2023-11-06 DIAGNOSIS — Z9221 Personal history of antineoplastic chemotherapy: Secondary | ICD-10-CM

## 2023-11-06 DIAGNOSIS — R296 Repeated falls: Secondary | ICD-10-CM | POA: Diagnosis present

## 2023-11-06 DIAGNOSIS — Z974 Presence of external hearing-aid: Secondary | ICD-10-CM

## 2023-11-06 DIAGNOSIS — Y842 Radiological procedure and radiotherapy as the cause of abnormal reaction of the patient, or of later complication, without mention of misadventure at the time of the procedure: Secondary | ICD-10-CM | POA: Diagnosis present

## 2023-11-06 DIAGNOSIS — N189 Chronic kidney disease, unspecified: Secondary | ICD-10-CM | POA: Diagnosis present

## 2023-11-06 DIAGNOSIS — G928 Other toxic encephalopathy: Secondary | ICD-10-CM | POA: Diagnosis present

## 2023-11-06 DIAGNOSIS — S27321A Contusion of lung, unilateral, initial encounter: Secondary | ICD-10-CM | POA: Diagnosis present

## 2023-11-06 DIAGNOSIS — Y92009 Unspecified place in unspecified non-institutional (private) residence as the place of occurrence of the external cause: Secondary | ICD-10-CM

## 2023-11-06 DIAGNOSIS — C109 Malignant neoplasm of oropharynx, unspecified: Secondary | ICD-10-CM | POA: Diagnosis present

## 2023-11-06 DIAGNOSIS — Z9359 Other cystostomy status: Secondary | ICD-10-CM

## 2023-11-06 DIAGNOSIS — N390 Urinary tract infection, site not specified: Secondary | ICD-10-CM | POA: Diagnosis present

## 2023-11-06 DIAGNOSIS — E1143 Type 2 diabetes mellitus with diabetic autonomic (poly)neuropathy: Secondary | ICD-10-CM | POA: Diagnosis present

## 2023-11-06 DIAGNOSIS — T83090A Other mechanical complication of cystostomy catheter, initial encounter: Secondary | ICD-10-CM | POA: Diagnosis present

## 2023-11-06 DIAGNOSIS — E870 Hyperosmolality and hypernatremia: Secondary | ICD-10-CM | POA: Diagnosis not present

## 2023-11-06 DIAGNOSIS — D509 Iron deficiency anemia, unspecified: Secondary | ICD-10-CM | POA: Diagnosis not present

## 2023-11-06 DIAGNOSIS — Z7985 Long-term (current) use of injectable non-insulin antidiabetic drugs: Secondary | ICD-10-CM

## 2023-11-06 DIAGNOSIS — G4733 Obstructive sleep apnea (adult) (pediatric): Secondary | ICD-10-CM | POA: Diagnosis present

## 2023-11-06 DIAGNOSIS — Z85818 Personal history of malignant neoplasm of other sites of lip, oral cavity, and pharynx: Secondary | ICD-10-CM

## 2023-11-06 DIAGNOSIS — Z8249 Family history of ischemic heart disease and other diseases of the circulatory system: Secondary | ICD-10-CM

## 2023-11-06 DIAGNOSIS — E860 Dehydration: Secondary | ICD-10-CM | POA: Diagnosis present

## 2023-11-06 DIAGNOSIS — D631 Anemia in chronic kidney disease: Secondary | ICD-10-CM | POA: Diagnosis present

## 2023-11-06 DIAGNOSIS — N400 Enlarged prostate without lower urinary tract symptoms: Secondary | ICD-10-CM | POA: Diagnosis not present

## 2023-11-06 DIAGNOSIS — Z885 Allergy status to narcotic agent status: Secondary | ICD-10-CM

## 2023-11-06 DIAGNOSIS — F1120 Opioid dependence, uncomplicated: Secondary | ICD-10-CM | POA: Diagnosis present

## 2023-11-06 DIAGNOSIS — Z87891 Personal history of nicotine dependence: Secondary | ICD-10-CM

## 2023-11-06 DIAGNOSIS — G9341 Metabolic encephalopathy: Secondary | ICD-10-CM | POA: Diagnosis present

## 2023-11-06 DIAGNOSIS — E871 Hypo-osmolality and hyponatremia: Secondary | ICD-10-CM | POA: Diagnosis present

## 2023-11-06 DIAGNOSIS — W19XXXA Unspecified fall, initial encounter: Secondary | ICD-10-CM | POA: Diagnosis present

## 2023-11-06 DIAGNOSIS — A419 Sepsis, unspecified organism: Principal | ICD-10-CM | POA: Diagnosis present

## 2023-11-06 DIAGNOSIS — T402X5A Adverse effect of other opioids, initial encounter: Secondary | ICD-10-CM | POA: Diagnosis present

## 2023-11-06 DIAGNOSIS — J9811 Atelectasis: Secondary | ICD-10-CM | POA: Diagnosis present

## 2023-11-06 DIAGNOSIS — Z794 Long term (current) use of insulin: Secondary | ICD-10-CM | POA: Diagnosis not present

## 2023-11-06 DIAGNOSIS — R410 Disorientation, unspecified: Secondary | ICD-10-CM | POA: Diagnosis present

## 2023-11-06 DIAGNOSIS — Z8589 Personal history of malignant neoplasm of other organs and systems: Secondary | ICD-10-CM

## 2023-11-06 DIAGNOSIS — J690 Pneumonitis due to inhalation of food and vomit: Secondary | ICD-10-CM | POA: Diagnosis not present

## 2023-11-06 DIAGNOSIS — N179 Acute kidney failure, unspecified: Secondary | ICD-10-CM | POA: Diagnosis present

## 2023-11-06 DIAGNOSIS — K5900 Constipation, unspecified: Secondary | ICD-10-CM | POA: Diagnosis not present

## 2023-11-06 DIAGNOSIS — T83510D Infection and inflammatory reaction due to cystostomy catheter, subsequent encounter: Secondary | ICD-10-CM | POA: Diagnosis not present

## 2023-11-06 DIAGNOSIS — K219 Gastro-esophageal reflux disease without esophagitis: Secondary | ICD-10-CM | POA: Diagnosis present

## 2023-11-06 DIAGNOSIS — R54 Age-related physical debility: Secondary | ICD-10-CM | POA: Diagnosis present

## 2023-11-06 DIAGNOSIS — I10 Essential (primary) hypertension: Secondary | ICD-10-CM | POA: Diagnosis not present

## 2023-11-06 DIAGNOSIS — S2242XD Multiple fractures of ribs, left side, subsequent encounter for fracture with routine healing: Secondary | ICD-10-CM | POA: Diagnosis not present

## 2023-11-06 DIAGNOSIS — S2242XA Multiple fractures of ribs, left side, initial encounter for closed fracture: Secondary | ICD-10-CM | POA: Diagnosis present

## 2023-11-06 DIAGNOSIS — B961 Klebsiella pneumoniae [K. pneumoniae] as the cause of diseases classified elsewhere: Secondary | ICD-10-CM | POA: Diagnosis present

## 2023-11-06 DIAGNOSIS — Y738 Miscellaneous gastroenterology and urology devices associated with adverse incidents, not elsewhere classified: Secondary | ICD-10-CM | POA: Diagnosis present

## 2023-11-06 DIAGNOSIS — Z9841 Cataract extraction status, right eye: Secondary | ICD-10-CM

## 2023-11-06 DIAGNOSIS — Z515 Encounter for palliative care: Secondary | ICD-10-CM | POA: Diagnosis not present

## 2023-11-06 DIAGNOSIS — Z79899 Other long term (current) drug therapy: Secondary | ICD-10-CM

## 2023-11-06 DIAGNOSIS — Z7984 Long term (current) use of oral hypoglycemic drugs: Secondary | ICD-10-CM

## 2023-11-06 DIAGNOSIS — E1122 Type 2 diabetes mellitus with diabetic chronic kidney disease: Secondary | ICD-10-CM | POA: Diagnosis present

## 2023-11-06 DIAGNOSIS — Z808 Family history of malignant neoplasm of other organs or systems: Secondary | ICD-10-CM

## 2023-11-06 DIAGNOSIS — N183 Chronic kidney disease, stage 3 unspecified: Secondary | ICD-10-CM | POA: Diagnosis present

## 2023-11-06 DIAGNOSIS — K5641 Fecal impaction: Secondary | ICD-10-CM | POA: Diagnosis present

## 2023-11-06 DIAGNOSIS — Z833 Family history of diabetes mellitus: Secondary | ICD-10-CM

## 2023-11-06 DIAGNOSIS — K900 Celiac disease: Secondary | ICD-10-CM | POA: Diagnosis present

## 2023-11-06 DIAGNOSIS — R7401 Elevation of levels of liver transaminase levels: Secondary | ICD-10-CM | POA: Diagnosis present

## 2023-11-06 DIAGNOSIS — I129 Hypertensive chronic kidney disease with stage 1 through stage 4 chronic kidney disease, or unspecified chronic kidney disease: Secondary | ICD-10-CM | POA: Diagnosis present

## 2023-11-06 DIAGNOSIS — Z923 Personal history of irradiation: Secondary | ICD-10-CM

## 2023-11-06 DIAGNOSIS — E1142 Type 2 diabetes mellitus with diabetic polyneuropathy: Secondary | ICD-10-CM | POA: Diagnosis present

## 2023-11-06 DIAGNOSIS — E782 Mixed hyperlipidemia: Secondary | ICD-10-CM | POA: Diagnosis present

## 2023-11-06 DIAGNOSIS — N184 Chronic kidney disease, stage 4 (severe): Secondary | ICD-10-CM | POA: Diagnosis present

## 2023-11-06 DIAGNOSIS — Z7189 Other specified counseling: Secondary | ICD-10-CM | POA: Diagnosis not present

## 2023-11-06 DIAGNOSIS — Z961 Presence of intraocular lens: Secondary | ICD-10-CM | POA: Diagnosis present

## 2023-11-06 DIAGNOSIS — K3184 Gastroparesis: Secondary | ICD-10-CM | POA: Diagnosis present

## 2023-11-06 DIAGNOSIS — S2249XA Multiple fractures of ribs, unspecified side, initial encounter for closed fracture: Secondary | ICD-10-CM | POA: Diagnosis present

## 2023-11-06 DIAGNOSIS — Z9842 Cataract extraction status, left eye: Secondary | ICD-10-CM

## 2023-11-06 DIAGNOSIS — Z7982 Long term (current) use of aspirin: Secondary | ICD-10-CM

## 2023-11-06 DIAGNOSIS — Z83438 Family history of other disorder of lipoprotein metabolism and other lipidemia: Secondary | ICD-10-CM

## 2023-11-06 DIAGNOSIS — T83511A Infection and inflammatory reaction due to indwelling urethral catheter, initial encounter: Secondary | ICD-10-CM | POA: Insufficient documentation

## 2023-11-06 LAB — URINALYSIS, ROUTINE W REFLEX MICROSCOPIC
Bilirubin Urine: NEGATIVE
Glucose, UA: NEGATIVE mg/dL
Ketones, ur: NEGATIVE mg/dL
Nitrite: NEGATIVE
Protein, ur: >=300 mg/dL — AB
Specific Gravity, Urine: 1.017 (ref 1.005–1.030)
WBC, UA: >50 WBC/hpf (ref 0–5)
pH: 5 (ref 5.0–8.0)

## 2023-11-06 LAB — COMPREHENSIVE METABOLIC PANEL
ALT: 183 U/L — ABNORMAL HIGH (ref 0–44)
AST: 198 U/L — ABNORMAL HIGH (ref 15–41)
Albumin: 2.4 g/dL — ABNORMAL LOW (ref 3.5–5.0)
Alkaline Phosphatase: 73 U/L (ref 38–126)
Anion gap: 13 (ref 5–15)
BUN: 85 mg/dL — ABNORMAL HIGH (ref 8–23)
CO2: 17 mmol/L — ABNORMAL LOW (ref 22–32)
Calcium: 8.4 mg/dL — ABNORMAL LOW (ref 8.9–10.3)
Chloride: 99 mmol/L (ref 98–111)
Creatinine, Ser: 5.38 mg/dL — ABNORMAL HIGH (ref 0.61–1.24)
GFR, Estimated: 11 mL/min — ABNORMAL LOW (ref >60)
Glucose, Bld: 123 mg/dL — ABNORMAL HIGH (ref 70–99)
Potassium: 5.6 mmol/L — ABNORMAL HIGH (ref 3.5–5.1)
Sodium: 129 mmol/L — ABNORMAL LOW (ref 135–145)
Total Bilirubin: 0.4 mg/dL (ref <1.2)
Total Protein: 5.7 g/dL — ABNORMAL LOW (ref 6.5–8.1)

## 2023-11-06 LAB — CBC
HCT: 28.1 % — ABNORMAL LOW (ref 39.0–52.0)
Hemoglobin: 9 g/dL — ABNORMAL LOW (ref 13.0–17.0)
MCH: 29.8 pg (ref 26.0–34.0)
MCHC: 32 g/dL (ref 30.0–36.0)
MCV: 93 fL (ref 80.0–100.0)
Platelets: 292 10*3/uL (ref 150–400)
RBC: 3.02 MIL/uL — ABNORMAL LOW (ref 4.22–5.81)
RDW: 16.8 % — ABNORMAL HIGH (ref 11.5–15.5)
WBC: 5 10*3/uL (ref 4.0–10.5)
nRBC: 1 % — ABNORMAL HIGH (ref 0.0–0.2)

## 2023-11-06 LAB — LACTIC ACID, PLASMA
Lactic Acid, Venous: 2.1 mmol/L (ref 0.5–1.9)
Lactic Acid, Venous: 1.4 mmol/L (ref 0.5–1.9)

## 2023-11-06 LAB — PROTIME-INR
INR: 1.3 — ABNORMAL HIGH (ref 0.8–1.2)
Prothrombin Time: 16.4 s — ABNORMAL HIGH (ref 11.4–15.2)

## 2023-11-06 LAB — PROCALCITONIN: Procalcitonin: 7.44 ng/mL

## 2023-11-06 LAB — ETHANOL: Alcohol, Ethyl (B): <10 mg/dL (ref <10)

## 2023-11-06 MED ORDER — PANTOPRAZOLE SODIUM 40 MG PO TBEC
40.0000 mg | DELAYED_RELEASE_TABLET | Freq: Every day | ORAL | Status: DC
  Administered 2023-11-07 – 2023-11-14 (×8): 40 mg via ORAL
  Filled 2023-11-06 (×8): qty 1

## 2023-11-06 MED ORDER — TRAMADOL HCL 50 MG PO TABS
50.0000 mg | ORAL_TABLET | Freq: Three times a day (TID) | ORAL | Status: DC | PRN
  Administered 2023-11-06 – 2023-11-07 (×2): 50 mg via ORAL
  Filled 2023-11-06 (×2): qty 1

## 2023-11-06 MED ORDER — SODIUM CHLORIDE 0.9 % IV BOLUS
500.0000 mL | Freq: Once | INTRAVENOUS | Status: AC
  Administered 2023-11-06: 500 mL via INTRAVENOUS

## 2023-11-06 MED ORDER — SENNOSIDES-DOCUSATE SODIUM 8.6-50 MG PO TABS
1.0000 | ORAL_TABLET | Freq: Two times a day (BID) | ORAL | Status: DC
  Administered 2023-11-06 – 2023-11-07 (×3): 1 via ORAL
  Filled 2023-11-06 (×3): qty 1

## 2023-11-06 MED ORDER — ONDANSETRON HCL 4 MG/2ML IJ SOLN: 4.0000 mg | Freq: Four times a day (QID) | INTRAMUSCULAR | Status: DC | PRN

## 2023-11-06 MED ORDER — SODIUM CHLORIDE 0.9 % IV SOLN
2.0000 g | INTRAVENOUS | Status: AC
  Administered 2023-11-07 – 2023-11-11 (×5): 2 g via INTRAVENOUS
  Filled 2023-11-06 (×4): qty 20

## 2023-11-06 MED ORDER — INSULIN ASPART 100 UNIT/ML IJ SOLN
0.0000 [IU] | Freq: Three times a day (TID) | INTRAMUSCULAR | Status: DC
  Administered 2023-11-07: 3 [IU] via SUBCUTANEOUS
  Administered 2023-11-07 – 2023-11-09 (×2): 2 [IU] via SUBCUTANEOUS
  Administered 2023-11-09 (×2): 1 [IU] via SUBCUTANEOUS
  Administered 2023-11-10: 2 [IU] via SUBCUTANEOUS
  Administered 2023-11-10: 7 [IU] via SUBCUTANEOUS
  Administered 2023-11-10: 2 [IU] via SUBCUTANEOUS
  Administered 2023-11-11 (×2): 3 [IU] via SUBCUTANEOUS
  Administered 2023-11-11: 5 [IU] via SUBCUTANEOUS
  Administered 2023-11-12 (×2): 3 [IU] via SUBCUTANEOUS
  Administered 2023-11-12: 7 [IU] via SUBCUTANEOUS
  Administered 2023-11-13: 3 [IU] via SUBCUTANEOUS
  Administered 2023-11-13: 7 [IU] via SUBCUTANEOUS
  Administered 2023-11-13 – 2023-11-14 (×2): 3 [IU] via SUBCUTANEOUS
  Administered 2023-11-14: 5 [IU] via SUBCUTANEOUS

## 2023-11-06 MED ORDER — SODIUM CHLORIDE 0.9 % IV BOLUS
1000.0000 mL | Freq: Once | INTRAVENOUS | Status: AC
  Administered 2023-11-06: 1000 mL via INTRAVENOUS

## 2023-11-06 MED ORDER — INSULIN ASPART 100 UNIT/ML IJ SOLN
0.0000 [IU] | Freq: Every day | INTRAMUSCULAR | Status: DC
  Administered 2023-11-09 – 2023-11-10 (×2): 2 [IU] via SUBCUTANEOUS
  Administered 2023-11-11: 4 [IU] via SUBCUTANEOUS
  Administered 2023-11-12: 3 [IU] via SUBCUTANEOUS
  Administered 2023-11-13: 2 [IU] via SUBCUTANEOUS

## 2023-11-06 MED ORDER — POLYETHYLENE GLYCOL 3350 17 G PO PACK
17.0000 g | PACK | Freq: Two times a day (BID) | ORAL | Status: DC
  Administered 2023-11-06 – 2023-11-07 (×3): 17 g via ORAL
  Filled 2023-11-06 (×3): qty 1

## 2023-11-06 MED ORDER — ONDANSETRON HCL 4 MG PO TABS: 4.0000 mg | ORAL_TABLET | Freq: Four times a day (QID) | ORAL | Status: DC | PRN

## 2023-11-06 MED ORDER — HEPARIN SODIUM (PORCINE) 5000 UNIT/ML IJ SOLN
5000.0000 [IU] | Freq: Three times a day (TID) | INTRAMUSCULAR | Status: DC
  Administered 2023-11-06 – 2023-11-14 (×23): 5000 [IU] via SUBCUTANEOUS
  Filled 2023-11-06 (×23): qty 1

## 2023-11-06 MED ORDER — LINACLOTIDE 145 MCG PO CAPS
290.0000 ug | ORAL_CAPSULE | Freq: Every day | ORAL | Status: DC
  Administered 2023-11-07 – 2023-11-14 (×8): 290 ug via ORAL
  Filled 2023-11-06 (×8): qty 2

## 2023-11-06 MED ORDER — SODIUM CHLORIDE 0.9 % IV SOLN
500.0000 mg | Freq: Once | INTRAVENOUS | Status: AC
  Administered 2023-11-06: 500 mg via INTRAVENOUS
  Filled 2023-11-06: qty 5

## 2023-11-06 MED ORDER — SODIUM CHLORIDE 0.9 % IV SOLN
500.0000 mg | INTRAVENOUS | Status: AC
  Administered 2023-11-07 – 2023-11-09 (×3): 500 mg via INTRAVENOUS
  Filled 2023-11-06 (×3): qty 5

## 2023-11-06 MED ORDER — SODIUM CHLORIDE 0.9 % IV SOLN
1.0000 g | Freq: Once | INTRAVENOUS | Status: AC
Start: 2023-11-06 — End: 2023-11-06
  Administered 2023-11-06: 1 g via INTRAVENOUS
  Filled 2023-11-06: qty 10

## 2023-11-06 MED ORDER — SODIUM CHLORIDE 0.9 % IV SOLN: INTRAVENOUS | Status: AC

## 2023-11-06 MED ORDER — INSULIN GLARGINE-YFGN 100 UNIT/ML ~~LOC~~ SOLN
10.0000 [IU] | Freq: Two times a day (BID) | SUBCUTANEOUS | Status: DC
  Administered 2023-11-07 – 2023-11-08 (×4): 10 [IU] via SUBCUTANEOUS
  Filled 2023-11-06 (×6): qty 0.1

## 2023-11-06 NOTE — ED Triage Notes (Signed)
Per EMS  Fall  2 days ago Larey Seat on a chair Rib pain Low oxygen RA initial 74%-88%

## 2023-11-06 NOTE — Assessment & Plan Note (Signed)
Presenting with hypotension. -Hold lisinopril -Resume clonidine 0.2 twice daily when able, to avoid rebound hypertension

## 2023-11-06 NOTE — Assessment & Plan Note (Signed)
Controlled.  A1c 6.2. - SSi- S -Resume home Lantus at reduced dose 10 units twice daily -Hold [place], Daisy

## 2023-11-06 NOTE — Assessment & Plan Note (Addendum)
Likely 2/2 opiates, AKI, hypotension.  Presenting with falls, confusion.  CT shows pulmonary contusion.  Chronic indwelling suprapubic catheter with UA showing large leukocytes and many bacteria.  -Obtain head CT -Obtain urine culture, no recent urine cultures -Hydrate -Limit opiates -Hold psychoactive meds for now - amitriptyline, Cymbalta, on med list spouse unsure if patient is taking, also topiramate new medication. -Continue IV ceftriaxone and azithromycin for now low threshold to discontinue antibiotics -Check procalcitonin

## 2023-11-06 NOTE — Progress Notes (Signed)
Patient admitted to room 202.  Patient A/Ox3 pleasantly confused son & wife in room.  Mepilex applied to R neck as pt picks at radiation site. VSS. IV fluids & ABX started. New IV placed. Patient eating dinner.  Patient & wife endorse frequent falls at home. Patient only painful with movement.  Last radiation was 12/19.

## 2023-11-06 NOTE — Assessment & Plan Note (Signed)
   Resume CPAP nightly

## 2023-11-06 NOTE — Assessment & Plan Note (Addendum)
Cr-5.38, baseline CKD stage IIIb/IV , with creatinine 1.8-2.  Prerenal, initial hypotension in ED has improved with fluids.  On lisinopril, Lasix on med list but he is not taking this. -1.5 L bolus given, continue N/s 75cc/hr x 20hrs -Hold lisinopril 20 mg daily

## 2023-11-06 NOTE — ED Notes (Signed)
ED TO INPATIENT HANDOFF REPORT  ED Nurse Name and Phone #: Cat  S Name/Age/Gender Brian Nielsen 62 y.o. male Room/Bed: APA07/APA07  Code Status   Code Status: Prior  Home/SNF/Other Home Patient oriented to: self, place, time, and situation Is this baseline? Yes   Triage Complete: Triage complete  Chief Complaint Acute metabolic encephalopathy [G93.41]  Triage Note Per EMS  Fall  2 days ago Larey Seat on a chair Rib pain Low oxygen RA initial 74%-88%    Allergies Allergies  Allergen Reactions   Duloxetine Other (See Comments)    "disoriented, confused, couldn't drive"   Gluten Meal     Celiac Disease   Codeine Rash    Level of Care/Admitting Diagnosis ED Disposition     ED Disposition  Admit   Condition  --   Comment  Hospital Area: [facility] [[id number]]  Level of Care: Telemetry [5]  Covid Evaluation: Asymptomatic - no recent exposure (last 10 days) testing not required  Diagnosis: Acute metabolic encephalopathy [[id number]]  Admitting Physician: Onnie Boer [[id number]]  Attending Physician: Onnie Boer [phone]  Certification:: I certify this patient will need inpatient services for at least 2 midnights  Expected Medical Readiness: 11/08/2023          B Medical/Surgery History Past Medical History:  Diagnosis Date   Adult celiac disease 2015   followed by dr Servando Snare (GI)   Anemia associated with chronic renal failure    hematologist--- dr Sarajane Jews;  treated with iron infusions   Benign localized prostatic hyperplasia with lower urinary tract symptoms (LUTS)    urologist--- dr Retta Diones   Cancer [facility]) 08/18/2023   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   Charcot foot due to diabetes mellitus (HCC)    Chronic constipation    CKD (chronic kidney disease), stage III [facility])    nephrologist--- dr Wolfgang Phoenix;  renal bx 11-20-2021   Diverticulosis of colon    Edema of both lower extremities    GERD (gastroesophageal reflux  disease)    History of adenomatous polyp of colon    HTN (hypertension)    Hyperlipidemia, mixed    Peripheral neuropathy    Peyronie's disease    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 meatal stricture    Retinopathy due to secondary diabetes mellitus (HCC)    both eyes  treated with injecitons   Thoracic spondylosis without myelopathy 11/22/2016   Type 2 diabetes mellitus [facility])    endocrinologist--- dr nida   Vitamin D deficiency    Wears hearing aid in both ears    Past Surgical History:  Procedure Laterality Date   BIOPSY N/A 09/07/2021   Procedure: BIOPSY;  Surgeon: Midge Minium, MD;  Location: [facility];  Service: Endoscopy;  Laterality: N/A;   CATARACT EXTRACTION W/ INTRAOCULAR LENS IMPLANT Bilateral 2023   COLONOSCOPY N/A 05/03/2014   Procedure: COLONOSCOPY;  Surgeon: West Bali, MD;  Location: [facility];  Service: Endoscopy;  Laterality: N/A;  12:00   COLONOSCOPY WITH PROPOFOL N/A 09/07/2021   Procedure: COLONOSCOPY WITH PROPOFOL;  Surgeon: Midge Minium, MD;  Location: [facility]ERY [facility]TR;  Service: Endoscopy;  Laterality: N/A;   CYSTOSCOPY WITH INJECTION N/A 07/26/2023   Procedure: CYSTOSCOPY WITH BOTOX INJECTION 100 UNITS;  Surgeon: Noel Christmas, MD;  Location: [facility] ORS;  Service: Urology;  Laterality: N/A;  30 minutes   CYSTOSCOPY WITH URETHRAL DILATATION N/A 01/10/2023   Procedure: CYSTOSCOPY;  Surgeon: Marcine Matar, MD;  Location: [facility];  Service: Urology;  Laterality: N/A;   ESOPHAGOGASTRODUODENOSCOPY N/A 05/03/2014   Procedure: ESOPHAGOGASTRODUODENOSCOPY (EGD);  Surgeon: West Bali, MD;  Location[facility];  Service: Endoscopy;  Laterality: N/A;   ESOPHAGOGASTRODUODENOSCOPY (EGD) WITH PROPOFOL N/A 06/03/2020   Procedure: ESOPHAGOGASTRODUODENOSCOPY (EGD) WITH PROPOFOL;  Surgeon: Regis Bill, MD;  Location: [facility] ENDOSCOPY;  Service: Endoscopy;  Laterality: N/A;   ESOPHAGOGASTRODUODENOSCOPY (EGD) WITH PROPOFOL N/A  09/07/2021   Procedure: ESOPHAGOGASTRODUODENOSCOPY (EGD) WITH PROPOFOL;  Surgeon: Midge Minium, MD;  Location: [facility];  Service: Endoscopy;  Laterality: N/A;  Diabetic   IR BONE MARROW BIOPSY & ASPIRATION  04/27/2023   MEATOTOMY N/A 01/10/2023   Procedure: MEATOTOMY ADULT;  Surgeon: Marcine Matar, MD;  Location: [facility];  Service: Urology;  Laterality: N/A;   PARS PLANA VITRECTOMY Right 03/21/2021   Procedure: PARS PLANA VITRECTOMY 25 GAUGE WITH INJECTION OF ANTIBIOTICS FOR ENDOPHTHALMITIS;  Surgeon: Carmela Rima, MD;  Location[facility] OR;  Service: Ophthalmology;  Laterality: Right;   POLYPECTOMY N/A 09/07/2021   Procedure: POLYPECTOMY;  Surgeon: Midge Minium, MD;  Location: [facility];  Service: Endoscopy;  Laterality: N/A;   REPAIR EXTENSOR TENDON Right 08/11/2022   Procedure: EXTENSOR CARPI ULNARIS TENDON DEBRIDEMENT, POSSIBLE SUBSHEATH RECONSTRUCTION;  Surgeon: Marlyne Beards, MD;  Location: [facility];  Service: Orthopedics;  Laterality: Right;  or regional plus MAC     A IV Location/Drains/Wounds Patient Lines/Drains/Airways Status     Active Line/Drains/Airways     Name Placement date Placement time Site Days   Peripheral IV 11/06/23 20 G 1" Right;Lateral;Upper Forearm 11/06/23  1443  Forearm  less than 1   Suprapubic Catheter 16 Fr. 07/26/23  1149  --  103            Intake/Output Last 24 hours No intake or output data in the 24 hours ending 11/06/23 1537  Labs/Imaging Results for orders placed or performed during the hospital encounter of 11/06/23 (from the past 48 hours)  Comprehensive metabolic panel     Status: Abnormal   Collection Time: 11/06/23  1:22 PM  Result Value Ref Range   Sodium 129 (L) 135 - 145 mmol/L   Potassium 5.6 (H) 3.5 - 5.1 mmol/L   Chloride 99 98 - 111 mmol/L   CO2 17 (L) 22 - 32 mmol/L   Glucose, Bld 123 (H) 70 - 99 mg/dL    Comment: Glucose reference range applies only to samples  taken after fasting for at least 8 hours.   BUN 85 (H) 8 - 23 mg/dL   Creatinine, Ser 1.61 (H) 0.61 - 1.24 mg/dL   Calcium 8.4 (L) 8.9 - 10.3 mg/dL   Total Protein 5.7 (L) 6.5 - 8.1 g/dL   Albumin 2.4 (L) 3.5 - 5.0 g/dL   AST 096 (H) 15 - 41 U/L   ALT 183 (H) 0 - 44 U/L   Alkaline Phosphatase 73 38 - 126 U/L   Total Bilirubin 0.4 <1.2 mg/dL   GFR, Estimated 11 (L) >60 mL/min    Comment: (NOTE) Calculated using the CKD-EPI Creatinine Equation (2021)    Anion gap 13 5 - 15    Comment: Performed at [facility], [address] [place], [place] [address]  CBC     Status: Abnormal   Collection Time: 11/06/23  1:22 PM  Result Value Ref Range   WBC 5.0 4.0 - 10.5 K/uL   RBC 3.02 (L) 4.22 - 5.81 MIL/uL   Hemoglobin 9.0 (L) 13.0 - 17.0 g/dL   HCT  28.1 (L) 39.0 - 52.0 %   MCV 93.0 80.0 - 100.0 fL   MCH 29.8 26.0 - 34.0 pg   MCHC 32.0 30.0 - 36.0 g/dL   RDW 21.3 (H) 08.6 - 57.8 %   Platelets 292 150 - 400 K/uL   nRBC 1.0 (H) 0.0 - 0.2 %    Comment: Performed at [facility] [address], [place], [place] [address]  Ethanol     Status: None   Collection Time: 11/06/23  1:22 PM  Result Value Ref Range   Alcohol, Ethyl (B) <10 <10 mg/dL    Comment: (NOTE) Lowest detectable limit for serum alcohol is 10 mg/dL.  For medical purposes only. Performed at [facility], [address] [place], [place] [address]   Lactic acid, plasma     Status: Abnormal   Collection Time: 11/06/23  1:22 PM  Result Value Ref Range   Lactic Acid, Venous 2.1 (HH) 0.5 - 1.9 mmol/L    Comment: CRITICAL RESULT CALLED TO, READ BACK BY AND VERIFIED WITH DR. LOCKWOOD AT 1452 11/06/2023 BY Havery Moros Performed at [facility], [address], [place], [place] [address]   Protime-INR     Status: Abnormal   Collection Time: 11/06/23  1:22 PM  Result Value Ref Range   Prothrombin Time 16.4 (H) 11.4 - 15.2 seconds   INR 1.3 (H) 0.8 - 1.2    Comment: (NOTE) INR goal varies based on device and disease  states. Performed at [facility], [address], [place], [place] [address]    DG Pelvis Portable Result Date: 11/06/2023 CLINICAL DATA:  Fall. EXAM: PORTABLE PELVIS 1-2 VIEWS COMPARISON:  Same day CT chest, abdomen, and pelvis. FINDINGS: There is no evidence of pelvic fracture or diastasis. The sacroiliac joints and pubic symphysis appear anatomically aligned. No pelvic bone lesions are seen. IMPRESSION: No acute osseous abnormality identified on [facility] pelvic radiograph. Electronically Signed   By: Hart Robinsons M.D.   On: 11/06/2023 14:09   DG Chest Port 1 View Result Date: 11/06/2023 CLINICAL DATA:  Fall. EXAM: PORTABLE CHEST 1 VIEW COMPARISON:  Same day CT chest, abdomen, and pelvis. Chest radiograph dated February 03, 2023. FINDINGS: Low lung volumes with accentuation of the cardiomediastinal silhouette. Overlapping telemetry wires. Patchy opacities at the left lung base. Blunting of the left costophrenic angle. The right lung appears clear. No pneumothorax. Posterior left tenth and eleventh rib fractures are not well visualized on this exam and better evaluated on the same-day CT chest, abdomen, and pelvis. IMPRESSION: 1. Posterior left tenth and eleventh rib fractures are not well visualized on this exam and better evaluated on the same-day CT chest, abdomen, and pelvis. No pneumothorax. 2. Patchy opacities at the left lung base could reflect contusion, infiltrate, or atelectasis. 3. Probable trace left effusion. Electronically Signed   By: Hart Robinsons M.D.   On: 11/06/2023 14:06   CT CHEST ABDOMEN PELVIS WO CONTRAST Result Date: 11/06/2023 CLINICAL DATA:  Fall 2 days ago. Blunt trauma. New onset hypoxia. Rib pain. EXAM: CT CHEST, ABDOMEN AND PELVIS WITHOUT CONTRAST TECHNIQUE: Multidetector CT imaging of the chest, abdomen and pelvis was performed following the standard protocol without IV contrast. RADIATION DOSE REDUCTION: This exam was performed according to the departmental  dose-optimization program which includes automated exposure control, adjustment of the mA and/or kV according to patient size and/or use of iterative reconstruction technique. COMPARISON:  CT AP from 10/15/2022 FINDINGS: CT CHEST FINDINGS Cardiovascular: Heart size appears mildly enlarged. There is no  pericardial effusion. Aortic atherosclerotic calcifications and coronary artery calcifications. Mediastinum/Nodes: No enlarged mediastinal, hilar, or axillary lymph nodes. Thyroid gland, trachea, and esophagus demonstrate no significant findings. Lungs/Pleura: Small volume of pleural fluid is identified within the left base the fluid measures water density. Lingular atelectasis. In the left lower lobe there are multifocal areas of subsegmental atelectasis, ground-glass attenuation, and posterior consolidative change. No pneumothorax identified. Musculoskeletal: There are acute fractures involving the posterior aspect of the left tenth and eleventh ribs posteriorly, image 53/2 and image 58/2. The thoracic vertebral body heights are well preserved without signs of acute fracture. Intact sternum. CT ABDOMEN PELVIS FINDINGS Hepatobiliary: No focal liver abnormality is seen. Status post cholecystectomy. No biliary dilatation. Pancreas: Unremarkable. No pancreatic ductal dilatation or surrounding inflammatory changes. Spleen: No splenic injury or perisplenic hematoma. Adrenals/Urinary Tract: No adrenal hemorrhage or renal injury identified. The bladder is partially decompressed around a suprapubic catheter. Stomach/Bowel: Stomach is unremarkable. No pathologic dilatation of the large or small bowel loops. The appendix is visualized and is normal. There is a moderate to large stool burden identified within the ascending colon. No bowel wall thickening or inflammatory changes identified Vascular/Lymphatic: Aortic atherosclerosis. No enlarged abdominal or pelvic lymph nodes. Reproductive: Prostate is unremarkable. Other: No  free fluid or fluid collections. Small fat containing umbilical hernia. Musculoskeletal: No fracture is seen. IMPRESSION: 1. Acute fractures involving the posterior aspect of the left tenth and eleventh ribs posteriorly. 2. Small volume of pleural fluid is identified within the left base. The fluid measures water density. No pneumothorax identified. 3. Multifocal areas of subsegmental atelectasis, ground-glass attenuation, and posterior consolidative change within the left lower lobe. Findings may reflect pulmonary contusion and/or atelectasis. 4. No acute findings within the abdomen or pelvis. 5. Moderate to large stool burden identified within the ascending colon. Correlate for any clinical signs or symptoms of constipation. 6. Coronary artery calcifications. 7.  Aortic Atherosclerosis (ICD10-I70.0). Electronically Signed   By: Signa Kell M.D.   On: 11/06/2023 13:56    Pending Labs Unresulted Labs (From admission, onward)     Start     Ordered   11/06/23 1322  Urinalysis, Routine w reflex microscopic -Urine, Suprapubic  [facility] ED TRAUMA PANEL AP)  Once,   URGENT       Question:  Specimen Source  Answer:  Urine, Suprapubic   11/06/23 1322            Vitals/Pain Today's Vitals   11/06/23 1315 11/06/23 1415 11/06/23 1500 11/06/23 1515  BP: (!) 86/55 93/66 (!) 90/52 (!) 92/57  Pulse: 91 89 91 92  Resp: (!) 27 (!) 22 20 18   Temp:      TempSrc:      SpO2: 98% 100% 100% 100%  PainSc:        Isolation Precautions No active isolations  Medications Medications  sodium chloride 0.9 % bolus 500 mL (has no administration in time range)  cefTRIAXone (ROCEPHIN) 1 g in sodium chloride 0.9 % 100 mL IVPB (1 g Intravenous New Bag/Given 11/06/23 1520)  azithromycin (ZITHROMAX) 500 mg in sodium chloride 0.9 % 250 mL IVPB (has no administration in time range)  sodium chloride 0.9 % bolus 500 mL (500 mLs Intravenous New Bag/Given 11/06/23 1401)    Mobility non-ambulatory     Focused  Assessments Pulmonary Assessment Handoff:  Lung sounds:          R Recommendations: See Admitting Provider Note  Report given to:   Additional Notes:

## 2023-11-06 NOTE — Assessment & Plan Note (Signed)
On chemoradiation therapy.  Follows with [facility] oncology.

## 2023-11-06 NOTE — ED Provider Notes (Signed)
[place] EMERGENCY DEPARTMENT AT [facility] Provider Note   CSN: [id number] Arrival date & time: 11/06/23  1116     History  Chief Complaint  Patient presents with   Fall   Rib Injury    Brian Nielsen is a 63 y.o. male.  HPI Patient presents with family members who provide the history.  Patient has confusion.  He has multiple medical problems, including head and neck cancer for which he is receiving radiation therapy.  Patient presents today with concern for worsening confusion as well as weakness as well as concern for left axillary pain following falls.  Patient had a fall around a week and another 1 last night, unclear circumstances, but possibly secondary to weakness.  Patient cannot recall exactly why he fell.  Seemingly patient fell onto his left axilla last night has had pain in that area since that time.  Family notes the patient completed his radiation therapy scheduled for his cancer, and wife notes that patient was likely taking his medication appropriately, though with worsening confusion over the past few days this is a?.     Home Medications Prior to Admission medications   Medication Sig Start Date End Date Taking? Authorizing Provider  amitriptyline (ELAVIL) 25 MG tablet Take 25 mg by mouth at bedtime. 06/16/23   [provider]  aspirin 81 MG EC tablet Take 81 mg by mouth daily.    [provider]  atorvastatin (LIPITOR) 40 MG tablet Take 1 tablet (40 mg total) by mouth daily. Patient taking differently: Take 40 mg by mouth daily. 10/21/16   Eustace Moore, MD  cloNIDine (CATAPRES) 0.2 MG tablet Take 0.2 mg by mouth 2 (two) times daily. 12/15/22 10/10/23  [provider]  DULoxetine (CYMBALTA) 20 MG capsule Take by mouth. 09/27/23   [provider]  furosemide (LASIX) 20 MG tablet Take 40 mg by mouth 2 (two) times daily. 08/02/23   [provider]  gabapentin (NEURONTIN) 300 MG capsule Take 300 mg by mouth 3  times/day as needed-between meals & bedtime.    [provider]  insulin glargine (LANTUS SOLOSTAR) 100 UNIT/ML Solostar Pen Inject 35 Units into the skin 2 (two) times daily.    [provider]  JARDIANCE 10 MG TABS tablet Take 10 mg by mouth daily.    [provider]  lidocaine (XYLOCAINE) 2 % solution Mix equal parts diphenhydramine, lidocaine, and liquid antacid. Swish 5 to in mouth for 60 seconds, every 4 to 6 hours as needed, then swallow or spit mixture out (as directed by prescriber). Shake well before using. Refrigerate after mixing and discard after 14 days. 10/05/23   [provider]  linaclotide Karlene Einstein) 290 MCG CAPS capsule Take 1 capsule (290 mcg total) by mouth daily before breakfast. 09/30/23   Midge Minium, MD  lisinopril (ZESTRIL) 20 MG tablet Take 20 mg by mouth daily. 03/04/23   [provider]  ondansetron (ZOFRAN) 8 MG tablet Take by mouth. 09/21/23   [provider]  oxyCODONE ER 9 MG C12A Take by mouth. 10/06/23   [provider]  pantoprazole (PROTONIX) 40 MG tablet Take 1 tablet by mouth daily. 11/23/21   [provider]  prochlorperazine (COMPAZINE) 10 MG tablet Take by mouth. 09/21/23   [provider]  tirzepatide Greggory Keen) 15 MG/0.5ML Pen Inject 15 mg into the skin once a week. 09/05/23         Allergies    Duloxetine, Gluten meal, and Codeine  Review of Systems   Review of Systems  Physical Exam Updated Vital Signs BP (!) 92/57   Pulse 92   Temp 98.2 F (36.8 C) (Oral)   Resp 18   SpO2 100%  Physical Exam Vitals and nursing note reviewed.  Constitutional:      General: He is not in acute distress.    Appearance: He is well-developed.  HENT:     Head: Normocephalic and atraumatic.  Eyes:     Conjunctiva/sclera: Conjunctivae normal.  Cardiovascular:     Rate and Rhythm: Regular rhythm. Tachycardia present.  Pulmonary:     Effort: Pulmonary effort is normal. No  respiratory distress.     Breath sounds: No stridor.  Abdominal:     General: There is no distension.     Tenderness: There is no abdominal tenderness. There is no guarding.    Skin:    General: Skin is warm and dry.  Neurological:     Mental Status: He is alert.     Comments: Patient oriented to self, roughly to place.  Face is symmetric, speech is brief, clear, though answers are not consistent or appropriate. Patient moves all extremity spontaneously.  Psychiatric:        Cognition and Memory: Cognition is impaired.     ED Results / Procedures / Treatments   Labs (all labs ordered are listed, but only abnormal results are displayed) Labs Reviewed  COMPREHENSIVE METABOLIC PANEL - Abnormal; Notable for the following components:      Result Value   Sodium 129 (*)    Potassium 5.6 (*)    CO2 17 (*)    Glucose, Bld 123 (*)    BUN 85 (*)    Creatinine, Ser 5.38 (*)    Calcium 8.4 (*)    Total Protein 5.7 (*)    Albumin 2.4 (*)    AST 198 (*)    ALT 183 (*)    GFR, Estimated 11 (*)    All other components within normal limits  CBC - Abnormal; Notable for the following components:   RBC 3.02 (*)    Hemoglobin 9.0 (*)    HCT 28.1 (*)    RDW 16.8 (*)    nRBC 1.0 (*)    All other components within normal limits  LACTIC ACID, PLASMA - Abnormal; Notable for the following components:   Lactic Acid, Venous 2.1 (*)    All other components within normal limits  PROTIME-INR - Abnormal; Notable for the following components:   Prothrombin Time 16.4 (*)    INR 1.3 (*)    All other components within normal limits  ETHANOL  URINALYSIS, ROUTINE W REFLEX MICROSCOPIC    EKG EKG Interpretation Date/Time:  Sunday November 06 2023 12:01:42 EST Ventricular Rate:  88 PR Interval:  154 QRS Duration:  112 QT Interval:  378 QTC Calculation: 458 R Axis:   19  Text Interpretation: Sinus rhythm Incomplete left bundle branch block Probable left ventricular hypertrophy ST-t wave  abnormality Artifact Abnormal ECG Confirmed by Gerhard Munch [id number]) on 11/06/2023 12:13:25 PM  Radiology DG Pelvis Portable Result Date: 11/06/2023 CLINICAL DATA:  Fall. EXAM: PORTABLE PELVIS 1-2 VIEWS COMPARISON:  Same day CT chest, abdomen, and pelvis. FINDINGS: There is no evidence of pelvic fracture or diastasis. The sacroiliac joints and pubic symphysis appear anatomically aligned. No pelvic bone lesions are seen. IMPRESSION: No acute osseous abnormality identified on AP pelvic radiograph. Electronically Signed   By: Hart Robinsons M.D.   On: 11/06/2023 14:09  DG Chest Port 1 View Result Date: 11/06/2023 CLINICAL DATA:  Fall. EXAM: PORTABLE CHEST 1 VIEW COMPARISON:  Same day CT chest, abdomen, and pelvis. Chest radiograph dated February 03, 2023. FINDINGS: Low lung volumes with accentuation of the cardiomediastinal silhouette. Overlapping telemetry wires. Patchy opacities at the left lung base. Blunting of the left costophrenic angle. The right lung appears clear. No pneumothorax. Posterior left tenth and eleventh rib fractures are not well visualized on this exam and better evaluated on the same-day CT chest, abdomen, and pelvis. IMPRESSION: 1. Posterior left tenth and eleventh rib fractures are not well visualized on this exam and better evaluated on the same-day CT chest, abdomen, and pelvis. No pneumothorax. 2. Patchy opacities at the left lung base could reflect contusion, infiltrate, or atelectasis. 3. Probable trace left effusion. Electronically Signed   By: Hart Robinsons M.D.   On: 11/06/2023 14:06   CT CHEST ABDOMEN PELVIS WO CONTRAST Result Date: 11/06/2023 CLINICAL DATA:  Fall 2 days ago. Blunt trauma. New onset hypoxia. Rib pain. EXAM: CT CHEST, ABDOMEN AND PELVIS WITHOUT CONTRAST TECHNIQUE: Multidetector CT imaging of the chest, abdomen and pelvis was performed following the standard protocol without IV contrast. RADIATION DOSE REDUCTION: This exam was performed according to the  departmental dose-optimization program which includes automated exposure control, adjustment of the mA and/or kV according to patient size and/or use of iterative reconstruction technique. COMPARISON:  CT AP from 10/15/2022 FINDINGS: CT CHEST FINDINGS Cardiovascular: Heart size appears mildly enlarged. There is no pericardial effusion. Aortic atherosclerotic calcifications and coronary artery calcifications. Mediastinum/Nodes: No enlarged mediastinal, hilar, or axillary lymph nodes. Thyroid gland, trachea, and esophagus demonstrate no significant findings. Lungs/Pleura: Small volume of pleural fluid is identified within the left base the fluid measures water density. Lingular atelectasis. In the left lower lobe there are multifocal areas of subsegmental atelectasis, ground-glass attenuation, and posterior consolidative change. No pneumothorax identified. Musculoskeletal: There are acute fractures involving the posterior aspect of the left tenth and eleventh ribs posteriorly, image 53/2 and image 58/2. The thoracic vertebral body heights are well preserved without signs of acute fracture. Intact sternum. CT ABDOMEN PELVIS FINDINGS Hepatobiliary: No focal liver abnormality is seen. Status post cholecystectomy. No biliary dilatation. Pancreas: Unremarkable. No pancreatic ductal dilatation or surrounding inflammatory changes. Spleen: No splenic injury or perisplenic hematoma. Adrenals/Urinary Tract: No adrenal hemorrhage or renal injury identified. The bladder is partially decompressed around a suprapubic catheter. Stomach/Bowel: Stomach is unremarkable. No pathologic dilatation of the large or small bowel loops. The appendix is visualized and is normal. There is a moderate to large stool burden identified within the ascending colon. No bowel wall thickening or inflammatory changes identified Vascular/Lymphatic: Aortic atherosclerosis. No enlarged abdominal or pelvic lymph nodes. Reproductive: Prostate is unremarkable.  Other: No free fluid or fluid collections. Small fat containing umbilical hernia. Musculoskeletal: No fracture is seen. IMPRESSION: 1. Acute fractures involving the posterior aspect of the left tenth and eleventh ribs posteriorly. 2. Small volume of pleural fluid is identified within the left base. The fluid measures water density. No pneumothorax identified. 3. Multifocal areas of subsegmental atelectasis, ground-glass attenuation, and posterior consolidative change within the left lower lobe. Findings may reflect pulmonary contusion and/or atelectasis. 4. No acute findings within the abdomen or pelvis. 5. Moderate to large stool burden identified within the ascending colon. Correlate for any clinical signs or symptoms of constipation. 6. Coronary artery calcifications. 7.  Aortic Atherosclerosis (ICD10-I70.0). Electronically Signed   By: Signa Kell M.D.   On: 11/06/2023 13:56  Procedures Procedures    Medications Ordered in ED Medications  sodium chloride 0.9 % bolus 500 mL (has no administration in time range)  cefTRIAXone (ROCEPHIN) 1 g in sodium chloride 0.9 % 100 mL IVPB (1 g Intravenous New Bag/Given 11/06/23 1520)  azithromycin (ZITHROMAX) 500 mg in sodium chloride 0.9 % 250 mL IVPB (has no administration in time range)  sodium chloride 0.9 % bolus 500 mL (500 mLs Intravenous New Bag/Given 11/06/23 1401)    ED Course/ Medical Decision Making/ A&P                                 Medical Decision Making Immunocompromise patient with ongoing radiation therapy, only recently stopped for head neck cancer presents with pain in his left axilla, confusion following fall.  Broad differential including progression of disease, infection, pneumonia, bacteremia, dehydration, intracranial progression of disease, rib fractures all considered. Cardiac 95 sinus normal Pulse ox 95% room air normal prior nursing note suggested 80% on room air  Amount and/or Complexity of Data Reviewed Independent  Historian: spouse External Data Reviewed: notes. Labs: ordered. Decision-making details documented in ED Course. Radiology: ordered and independent interpretation performed. Decision-making details documented in ED Course. ECG/medicine tests: ordered and independent interpretation performed. Decision-making details documented in ED Course.  Risk Prescription drug management. Decision regarding hospitalization. Diagnosis or treatment significantly limited by social determinants of health.   3:39 PM Family aware of all findings including 2 rib fractures, AKI, both likely contributing to the patient's delirium.  In addition, with abnormal CT, though  findings may be more consistent with pulmonary contusion, patient will receive empiric therapy for pneumonia given his broken ribs, possible earlier hypoxia, AKI.  With ongoing antibiotics, fluids, continuous monitoring, patient will require admission for further monitoring, management.  Final Clinical Impression(s) / ED Diagnoses Final diagnoses:  Delirium  Fall, initial encounter  AKI (acute kidney injury) (HCC)   CRITICAL CARE Performed by: Gerhard Munch Total critical care time: 35 minutes Critical care time was exclusive of separately billable procedures and treating other patients. Critical care was necessary to treat or prevent imminent or life-threatening deterioration. Critical care was time spent personally by me on the following activities: development of treatment plan with patient and/or surrogate as well as nursing, discussions with consultants, evaluation of patient's response to treatment, examination of patient, obtaining history from patient or surrogate, ordering and performing treatments and interventions, ordering and review of laboratory studies, ordering and review of radiographic studies, pulse oximetry and re-evaluation of patient's condition.    Gerhard Munch, MD 11/06/23 1540

## 2023-11-06 NOTE — Assessment & Plan Note (Addendum)
Chronic suprapubic catheter.  Last changed per spouse about 2 weeks ago.  Follows with urology.

## 2023-11-06 NOTE — Assessment & Plan Note (Addendum)
Likely secondary to encephalopathy, opiates, hypotension/dehydration.  CT chest shows left 10th and 11th rib fractures, suggest pulmonary contusion/atelectasis.  He reports a chronic cough and some dyspnea prior to arrival in the ED. Not hypoxic with O2 sats 98% on room air currently.  Afebrile.  WBC 5.  Lactic acid 2.1-likely  2/2 hypotension. - Most likely pulmonary contusion, IV ceftriaxone and azithromycin started in ED, continue for now with low threshold to discontinue -Check procalcitonin -PT eval when able

## 2023-11-06 NOTE — Assessment & Plan Note (Addendum)
AST 198, ALT 183.  Normal T. bili 0.4, ALP 73.  No abdominal pain.  CT abdomen and pelvis without focal liver abnormality, status postcholecystectomy no biliary dilatation.  -Trend for now likely from hypotension -Hold Lipitor -Per Care Everywhere recent hepatitis B- 09/2023 nonreactive. - Check Hep C

## 2023-11-06 NOTE — H&P (Addendum)
History and Physical    Brian Nielsen NWG:[id number] DOB: 1961/08/03 DOA: 11/06/2023  PCP: Chyrl Civatte, FNP   Patient coming from: Home  I have personally briefly reviewed patient's old medical records in [facility]  Chief Complaint: Fall, confusion  HPI: Brian Nielsen is a 62 y.o. male with medical history significant for HTN, DM, CKD3, head and neck cancer, anemia, suprapubic catheter. Patient presented to the ED with complaints of a fall and confusion. On my evaluation patient is awake alert but fidgety, able to answer simple questions, he remembers falling but not give a lot of history.  Spouse and son who are at bedside.  Confusion started about a week ago, with patient saying things that did not make sense, they thought this was related to patient's pain medication - they are not sure exactly how much he was taking but they believe he was taking more than prescribed.  He fell about a week ago, and then fell again yesterday.  They report yesterday he was standing and weaving about the bed and fell.  Patient denies dizziness, denies chest pain no difficulty breathing, but he is having pain to his left side of his chest. They report a chronic productive cough that is unchanged related to his throat cancer and treatment. Spouse reports some initial difficulty breathing today on EMS arrival, but this had resolved on arrival to the ED. He is on ulcers in his mouth, but the report reports stable oral intake.  No vomiting.  He is constipated.  No fevers.  ED Course: Blood pressure systolic down to 57/84 improved with fluid bolus currently low 90s.  O2 sats-documented at 88% on room air- per EDP- was an error. Patient was 98% on room on my air evaluation. Creatinine elevated at 5.38.  Sodium 129.   Mildly elevated liver enzymes AST 198, ALT 183.  Lactic acid 2.1. Pelvic X-ray negative for acute abnormality. CT chest abdomen and pelvis-acute fractures-posterior left 10th and 11th ribs,  small volume pleural fluid within left base-fluid measures water density.  No pneumothorax.  Multifocal subsegmental atelectasis, groundglass attenuation posterior consolidative change left lung-large contusion or atelectasis. IV ceftriaxone and azithromycin started.  1.5 L bolus given.   Review of Systems: As per HPI all other systems reviewed and negative.  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   Charcot foot due to diabetes mellitus (HCC)    Chronic constipation    CKD (chronic kidney disease), stage III [facility])    nephrologist--- dr Wolfgang [place];  renal bx 11-20-2021   Diverticulosis of colon    Edema of both lower extremities    GERD (gastroesophageal reflux disease)    History of adenomatous polyp of colon    HTN (hypertension)    Hyperlipidemia, mixed    Peripheral neuropathy    Peyronie's disease    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 meatal stricture    Retinopathy due to secondary diabetes mellitus (HCC)    both eyes  treated with injecitons   Thoracic spondylosis without myelopathy 11/22/2016   Type 2 diabetes mellitus [facility])    endocrinologist--- dr nida   Vitamin D deficiency  Wears hearing aid in both ears     Past Surgical History:  Procedure Laterality Date   BIOPSY N/A 09/07/2021   Procedure: BIOPSY;  Surgeon: Midge Minium, MD;  Location: [facility];  Service: Endoscopy;  Laterality: N/A;   CATARACT EXTRACTION W/ INTRAOCULAR LENS IMPLANT Bilateral 2023   COLONOSCOPY N/A 05/03/2014   Procedure: COLONOSCOPY;  Surgeon: West Bali, MD;  Location: [facility];  Service: Endoscopy;  Laterality: N/A;  12:00    COLONOSCOPY WITH PROPOFOL N/A 09/07/2021   Procedure: COLONOSCOPY WITH PROPOFOL;  Surgeon: Midge Minium, MD;  Location: [facility];  Service: Endoscopy;  Laterality: N/A;   CYSTOSCOPY WITH INJECTION N/A 07/26/2023   Procedure: CYSTOSCOPY WITH BOTOX INJECTION 100 UNITS;  Surgeon: Noel Christmas, MD;  Location: [facility];  Service: Urology;  Laterality: N/A;  30 minutes   CYSTOSCOPY WITH URETHRAL DILATATION N/A 01/10/2023   Procedure: CYSTOSCOPY;  Surgeon: Marcine Matar, MD;  Location: [facility];  Service: Urology;  Laterality: N/A;   ESOPHAGOGASTRODUODENOSCOPY N/A 05/03/2014   Procedure: ESOPHAGOGASTRODUODENOSCOPY (EGD);  Surgeon: West Bali, MD;  Location[facility];  Service: Endoscopy;  Laterality: N/A;   ESOPHAGOGASTRODUODENOSCOPY (EGD) WITH PROPOFOL N/A 06/03/2020   Procedure: ESOPHAGOGASTRODUODENOSCOPY (EGD) WITH PROPOFOL;  Surgeon: Regis Bill, MD;  Location: [facility] END[facility];  Service: Endoscopy;  Laterality: N/A;   ESOPHAGOGASTRODUODENOSCOPY (EGD) WITH PROPOFOL N/A 09/07/2021   Procedure: ESOPHAGOGASTRODUODENOSCOPY (EGD) WITH PROPOFOL;  Surgeon: Midge Minium, MD;  Location: [facility];  Service: Endoscopy;  Laterality: N/A;  Diabetic   IR BONE MARROW BIOPSY & ASPIRATION  04/27/2023   MEATOTOMY N/A 01/10/2023   Procedure: MEATOTOMY ADULT;  Surgeon: Marcine Matar, MD;  Location: [facility];  Service: Urology;  Laterality: N/A;   PARS PLANA VITRECTOMY Right 03/21/2021   Procedure: PARS PLANA VITRECTOMY 25 GAUGE WITH INJECTION OF ANTIBIOTICS FOR ENDOPHTHALMITIS;  Surgeon: Carmela Rima, MD;  Location: [facility] OR;  Service: Ophthalmology;  Laterality: Right;   POLYPECTOMY N/A 09/07/2021   Procedure: POLYPECTOMY;  Surgeon: Midge Minium, MD;  Location: [facility];  Service: Endoscopy;  Laterality: N/A;   REPAIR EXTENSOR TENDON Right 08/11/2022   Procedure: EXTENSOR CARPI ULNARIS TENDON DEBRIDEMENT, POSSIBLE SUBSHEATH  RECONSTRUCTION;  Surgeon: Marlyne Beards, MD;  Location: [facility];  Service: Orthopedics;  Laterality: Right;  or regional plus MAC     reports that he has never smoked. His smokeless tobacco use includes snuff. He reports that he does not currently use alcohol. He reports that he does not use drugs.  Allergies  Allergen Reactions   Duloxetine Other (See Comments)    "disoriented, confused, couldn't drive"   Gluten Meal     Celiac Disease   Codeine Rash    Family History  Problem Relation Age of Onset   Aneurysm Mother        brain   Hypertension Mother    Cancer Father    Heart disease Father    Diabetes Father    Hypertension Father    Hyperlipidemia Father    Cancer Maternal Grandmother        melanoma   Heart disease Paternal Grandfather    Colon cancer Neg Hx     Prior to Admission medications   Medication Sig Start Date End Date Taking? Authorizing Provider  amitriptyline (ELAVIL) 25 MG tablet Take 25 mg by mouth at bedtime. 06/16/23   [provider]  aspirin 81 MG EC tablet Take 81 mg by mouth daily.  [provider]  atorvastatin (LIPITOR) 40 MG tablet Take 1 tablet (40 mg total) by mouth daily. Patient taking differently: Take 40 mg by mouth daily. 10/21/16   Eustace Moore, MD  cloNIDine (CATAPRES) 0.2 MG tablet Take 0.2 mg by mouth 2 (two) times daily. 12/15/22 10/10/23  [provider]  DULoxetine (CYMBALTA) 20 MG capsule Take by mouth. 09/27/23   [provider]  furosemide (LASIX) 20 MG tablet Take 40 mg by mouth 2 (two) times daily. 08/02/23   [provider]  gabapentin (NEURONTIN) 300 MG capsule Take 300 mg by mouth 3 times/day as needed-between meals & bedtime.    [provider]  insulin glargine (LANTUS SOLOSTAR) 100 UNIT/ML Solostar Pen Inject 35 Units into the skin 2 (two) times daily.    [provider]  JARDIANCE 10 MG TABS tablet Take 10 mg by mouth daily.     [provider]  lidocaine (XYLOCAINE) 2 % solution Mix equal parts diphenhydramine, lidocaine, and liquid antacid. Swish 5 to in mouth for 60 seconds, every 4 to 6 hours as needed, then swallow or spit mixture out (as directed by prescriber). Shake well before using. Refrigerate after mixing and discard after 14 days. 10/05/23   [provider]  linaclotide Karlene Einstein) 290 MCG CAPS capsule Take 1 capsule (290 mcg total) by mouth daily before breakfast. 09/30/23   Midge Minium, MD  lisinopril (ZESTRIL) 20 MG tablet Take 20 mg by mouth daily. 03/04/23   [provider]  ondansetron (ZOFRAN) 8 MG tablet Take by mouth. 09/21/23   [provider]  oxyCODONE ER 9 MG C12A Take by mouth. 10/06/23   [provider]  pantoprazole (PROTONIX) 40 MG tablet Take 1 tablet by mouth daily. 11/23/21   [provider]  prochlorperazine (COMPAZINE) 10 MG tablet Take by mouth. 09/21/23   [provider]  tirzepatide Greggory Keen) 15 MG/0.5ML Pen Inject 15 mg into the skin once a week. 09/05/23       Physical Exam: Vitals:   11/06/23 1315 11/06/23 1415 11/06/23 1500 11/06/23 1515  BP: (!) 86/55 93/66 (!) 90/52 (!) 92/57  Pulse: 91 89 91 92  Resp: (!) 27 (!) 22 20 18   Temp:      TempSrc:      SpO2: 98% 100% 100% 100%    Constitutional: Fidgety/mildly agitated Vitals:   11/06/23 1315 11/06/23 1415 11/06/23 1500 11/06/23 1515  BP: (!) 86/55 93/66 (!) 90/52 (!) 92/57  Pulse: 91 89 91 92  Resp: (!) 27 (!) 22 20 18   Temp:      TempSrc:      SpO2: 98% 100% 100% 100%   Eyes: PERRL, lids and conjunctivae normal ENMT: Mucous membranes are moist.  Some patchy areas of erythema to right buccal cavity area Neck: normal, supple, no masses, no thyromegaly Respiratory: clear to auscultation bilaterally, no wheezing, no crackles. Normal respiratory effort. No accessory muscle use.  Cardiovascular: Regular rate and rhythm, no murmurs / rubs / gallops. No  extremity edema.  Extremities warm. Abdomen: suprapubic catheter present, area clean, no tenderness, no masses palpated. No hepatosplenomegaly.   Musculoskeletal: no clubbing / cyanosis. No joint deformity upper and lower extremities. Good ROM, no contractures. Normal muscle tone.  Skin: Large area of diffuse bruising with scabbed areas to right side of neck extending from mandible to clavicle-Per family from radiation therapy, no purulence, no sign of infection Neurologic: No facial asymmetry, moving extremities spontaneously.  Psychiatric: Awake and alert, fidgety/mildly agitated,  but able to be directed  Labs on Admission: I have personally reviewed following labs and imaging studies  CBC: Recent Labs  Lab 11/06/23 1322  WBC 5.0  HGB 9.0*  HCT 28.1*  MCV 93.0  PLT 292   Basic Metabolic Panel: Recent Labs  Lab 11/06/23 1322  NA 129*  K 5.6*  CL 99  CO2 17*  GLUCOSE 123*  BUN 85*  CREATININE 5.38*  CALCIUM 8.4*   GFR: CrCl cannot be calculated (Unknown ideal weight.). Liver Function Tests: Recent Labs  Lab 11/06/23 1322  AST 198*  ALT 183*  ALKPHOS 73  BILITOT 0.4  PROT 5.7*  ALBUMIN 2.4*   Coagulation Profile: Recent Labs  Lab 11/06/23 1322  INR 1.3*    Radiological Exams on Admission: DG Pelvis Portable Result Date: 11/06/2023 CLINICAL DATA:  Fall. EXAM: PORTABLE PELVIS 1-2 VIEWS COMPARISON:  Same day CT chest, abdomen, and pelvis. FINDINGS: There is no evidence of pelvic fracture or diastasis. The sacroiliac joints and pubic symphysis appear anatomically aligned. No pelvic bone lesions are seen. IMPRESSION: No acute osseous abnormality identified on [facility] pelvic radiograph. Electronically Signed   By: Hart Robinsons M.D.   On: 11/06/2023 14:09   DG Chest Port 1 View Result Date: 11/06/2023 CLINICAL DATA:  Fall. EXAM: PORTABLE CHEST 1 VIEW COMPARISON:  Same day CT chest, abdomen, and pelvis. Chest radiograph dated February 03, 2023. FINDINGS: Low lung volumes  with accentuation of the cardiomediastinal silhouette. Overlapping telemetry wires. Patchy opacities at the left lung base. Blunting of the left costophrenic angle. The right lung appears clear. No pneumothorax. Posterior left tenth and eleventh rib fractures are not well visualized on this exam and better evaluated on the same-day CT chest, abdomen, and pelvis. IMPRESSION: 1. Posterior left tenth and eleventh rib fractures are not well visualized on this exam and better evaluated on the same-day CT chest, abdomen, and pelvis. No pneumothorax. 2. Patchy opacities at the left lung base could reflect contusion, infiltrate, or atelectasis. 3. Probable trace left effusion. Electronically Signed   By: Hart Robinsons M.D.   On: 11/06/2023 14:06   CT CHEST ABDOMEN PELVIS WO CONTRAST Result Date: 11/06/2023 CLINICAL DATA:  Fall 2 days ago. Blunt trauma. New onset hypoxia. Rib pain. EXAM: CT CHEST, ABDOMEN AND PELVIS WITHOUT CONTRAST TECHNIQUE: Multidetector CT imaging of the chest, abdomen and pelvis was performed following the standard protocol without IV contrast. RADIATION DOSE REDUCTION: This exam was performed according to the departmental dose-optimization program which includes automated exposure control, adjustment of the mA and/or kV according to patient size and/or use of iterative reconstruction technique. COMPARISON:  CT AP from 10/15/2022 FINDINGS: CT CHEST FINDINGS Cardiovascular: Heart size appears mildly enlarged. There is no pericardial effusion. Aortic atherosclerotic calcifications and coronary artery calcifications. Mediastinum/Nodes: No enlarged mediastinal, hilar, or axillary lymph nodes. Thyroid gland, trachea, and esophagus demonstrate no significant findings. Lungs/Pleura: Small volume of pleural fluid is identified within the left base the fluid measures water density. Lingular atelectasis. In the left lower lobe there are multifocal areas of subsegmental atelectasis, ground-glass  attenuation, and posterior consolidative change. No pneumothorax identified. Musculoskeletal: There are acute fractures involving the posterior aspect of the left tenth and eleventh ribs posteriorly, image 53/2 and image 58/2. The thoracic vertebral body heights are well preserved without signs of acute fracture. Intact sternum. CT ABDOMEN PELVIS FINDINGS Hepatobiliary: No focal liver abnormality is seen. Status post cholecystectomy. No biliary dilatation. Pancreas: Unremarkable. No pancreatic ductal dilatation or surrounding inflammatory changes. Spleen: No  splenic injury or perisplenic hematoma. Adrenals/Urinary Tract: No adrenal hemorrhage or renal injury identified. The bladder is partially decompressed around a suprapubic catheter. Stomach/Bowel: Stomach is unremarkable. No pathologic dilatation of the large or small bowel loops. The appendix is visualized and is normal. There is a moderate to large stool burden identified within the ascending colon. No bowel wall thickening or inflammatory changes identified Vascular/Lymphatic: Aortic atherosclerosis. No enlarged abdominal or pelvic lymph nodes. Reproductive: Prostate is unremarkable. Other: No free fluid or fluid collections. Small fat containing umbilical hernia. Musculoskeletal: No fracture is seen. IMPRESSION: 1. Acute fractures involving the posterior aspect of the left tenth and eleventh ribs posteriorly. 2. Small volume of pleural fluid is identified within the left base. The fluid measures water density. No pneumothorax identified. 3. Multifocal areas of subsegmental atelectasis, ground-glass attenuation, and posterior consolidative change within the left lower lobe. Findings may reflect pulmonary contusion and/or atelectasis. 4. No acute findings within the abdomen or pelvis. 5. Moderate to large stool burden identified within the ascending colon. Correlate for any clinical signs or symptoms of constipation. 6. Coronary artery calcifications. 7.   Aortic Atherosclerosis (ICD10-I70.0). Electronically Signed   By: Signa Kell M.D.   On: 11/06/2023 13:56    EKG: Independently reviewed.  Sinus rhythm, rate 88, QTc 458.  LVH, IRBBB.  No significant change from prior.  Assessment/Plan Principal Problem:   Acute metabolic encephalopathy Active Problems:   Acute kidney injury superimposed on CKD (HCC)   Fall at home, initial encounter   Rib fractures   Suprapubic catheter (HCC)   Transaminitis   Hyponatremia   BPH (benign prostatic hyperplasia)   Essential hypertension, benign   Diabetes mellitus with stage 3 chronic kidney disease (HCC)   OSA (obstructive sleep apnea)   Oropharynx cancer (HCC)   Assessment and Plan: * Acute metabolic encephalopathy Likely 2/2 opiates, AKI, hypotension.  Presenting with falls, confusion.  CT shows pulmonary contusion.  Chronic indwelling suprapubic catheter with UA showing large leukocytes and many bacteria.  -Obtain head CT -Obtain urine culture, no recent urine cultures -Hydrate -Limit opiates -Hold psychoactive meds for now - amitriptyline, Cymbalta, on med list spouse unsure if patient is taking, also topiramate new medication. -Continue IV ceftriaxone and azithromycin for now low threshold to discontinue antibiotics -Check procalcitonin   Fall at home, initial encounter Likely secondary to encephalopathy, opiates, hypotension/dehydration.  CT chest shows left 10th and 11th rib fractures, suggest pulmonary contusion/atelectasis.  He reports a chronic cough and some dyspnea prior to arrival in the ED. Not hypoxic with O2 sats 98% on room air currently.  Afebrile.  WBC 5.  Lactic acid 2.1-likely  2/2 hypotension. - Most likely pulmonary contusion, IV ceftriaxone and azithromycin started in ED, continue for now with low threshold to discontinue -Check procalcitonin -PT eval when able  Acute kidney injury superimposed on CKD (HCC) Cr-5.38, baseline CKD stage IIIb/IV , with creatinine 1.8-2.   Prerenal, initial hypotension in ED has improved with fluids.  On lisinopril, Lasix on med list but he is not taking this. -1.5 L bolus given, continue N/s 75cc/hr x 20hrs -Hold lisinopril 20 mg daily  Hyponatremia Mild hyponatremia sodium 129.  Baseline 1 37-1 39. -Hydrate with normal saline  Transaminitis AST 198, ALT 183.  Normal T. bili 0.4, ALP 73.  No abdominal pain.  CT abdomen and pelvis without focal liver abnormality, status postcholecystectomy no biliary dilatation.  -Trend for now likely from hypotension -Hold Lipitor -Per Care Everywhere recent hepatitis B- 09/2023 nonreactive. -  Check Hep C  Oropharynx cancer (HCC) On chemoradiation therapy.  Follows with [facility] oncology.  OSA (obstructive sleep apnea) Resume CPAP nightly  Diabetes mellitus with stage 3 chronic kidney disease (HCC) Controlled.  A1c 6.2. - SSi- S -Resume home Lantus at reduced dose 10 units twice daily -Hold Mounjaro, Jardiance  Essential hypertension, benign Presenting with hypotension. -Hold lisinopril -Resume clonidine 0.2 twice daily when able, to avoid rebound hypertension  BPH (benign prostatic hyperplasia) Chronic suprapubic catheter.  Last changed per spouse about 2 weeks ago.  Follows with urology.   DVT prophylaxis: Heparin Code Status: Full code, confirmed with patient, spouse and son at bedside Family Communication: Spouse, and son [name] at bedside. Disposition Plan:  >/~ 2 days Consults called: none Admission status: inpt tele I certify that at the point of admission it is my clinical judgment that the patient will require inpatient hospital care spanning beyond 2 midnights from the point of admission due to high intensity of service, high risk for further deterioration and high frequency of surveillance required.   Author: Onnie Boer, MD 11/06/2023 4:37 PM  For on call review [email or web address].

## 2023-11-06 NOTE — Assessment & Plan Note (Signed)
Mild hyponatremia sodium 129.  Baseline 1 37-1 39. -Hydrate with normal saline

## 2023-11-07 DIAGNOSIS — E1122 Type 2 diabetes mellitus with diabetic chronic kidney disease: Secondary | ICD-10-CM | POA: Diagnosis not present

## 2023-11-07 DIAGNOSIS — D509 Iron deficiency anemia, unspecified: Secondary | ICD-10-CM

## 2023-11-07 DIAGNOSIS — R7401 Elevation of levels of liver transaminase levels: Secondary | ICD-10-CM | POA: Diagnosis not present

## 2023-11-07 DIAGNOSIS — K5900 Constipation, unspecified: Secondary | ICD-10-CM | POA: Diagnosis not present

## 2023-11-07 DIAGNOSIS — C109 Malignant neoplasm of oropharynx, unspecified: Secondary | ICD-10-CM

## 2023-11-07 DIAGNOSIS — N179 Acute kidney failure, unspecified: Secondary | ICD-10-CM | POA: Diagnosis not present

## 2023-11-07 DIAGNOSIS — G9341 Metabolic encephalopathy: Secondary | ICD-10-CM | POA: Diagnosis not present

## 2023-11-07 LAB — COMPREHENSIVE METABOLIC PANEL
ALT: 139 U/L — ABNORMAL HIGH (ref 0–44)
AST: 114 U/L — ABNORMAL HIGH (ref 15–41)
Albumin: 1.9 g/dL — ABNORMAL LOW (ref 3.5–5.0)
Alkaline Phosphatase: 63 U/L (ref 38–126)
Anion gap: 8 (ref 5–15)
BUN: 74 mg/dL — ABNORMAL HIGH (ref 8–23)
CO2: 20 mmol/L — ABNORMAL LOW (ref 22–32)
Calcium: 8 mg/dL — ABNORMAL LOW (ref 8.9–10.3)
Chloride: 107 mmol/L (ref 98–111)
Creatinine, Ser: 4.41 mg/dL — ABNORMAL HIGH (ref 0.61–1.24)
GFR, Estimated: 14 mL/min — ABNORMAL LOW (ref >60)
Glucose, Bld: 51 mg/dL — ABNORMAL LOW (ref 70–99)
Potassium: 4.7 mmol/L (ref 3.5–5.1)
Sodium: 135 mmol/L (ref 135–145)
Total Bilirubin: 0.5 mg/dL (ref <1.2)
Total Protein: 4.9 g/dL — ABNORMAL LOW (ref 6.5–8.1)

## 2023-11-07 LAB — CBC
HCT: 25.1 % — ABNORMAL LOW (ref 39.0–52.0)
Hemoglobin: 8 g/dL — ABNORMAL LOW (ref 13.0–17.0)
MCH: 30 pg (ref 26.0–34.0)
MCHC: 31.9 g/dL (ref 30.0–36.0)
MCV: 94 fL (ref 80.0–100.0)
Platelets: 199 10*3/uL (ref 150–400)
RBC: 2.67 MIL/uL — ABNORMAL LOW (ref 4.22–5.81)
RDW: 17.2 % — ABNORMAL HIGH (ref 11.5–15.5)
WBC: 5.7 10*3/uL (ref 4.0–10.5)
nRBC: 0.5 % — ABNORMAL HIGH (ref 0.0–0.2)

## 2023-11-07 LAB — GLUCOSE, CAPILLARY
Glucose-Capillary: 84 mg/dL (ref 70–99)
Glucose-Capillary: 99 mg/dL (ref 70–99)
Glucose-Capillary: 237 mg/dL — ABNORMAL HIGH (ref 70–99)
Glucose-Capillary: 270 mg/dL — ABNORMAL HIGH (ref 70–99)
Glucose-Capillary: 152 mg/dL — ABNORMAL HIGH (ref 70–99)

## 2023-11-07 LAB — HEPATITIS C ANTIBODY: HCV Ab: REACTIVE — AB

## 2023-11-07 LAB — HIV ANTIBODY (ROUTINE TESTING W REFLEX): HIV Screen 4th Generation wRfx: NONREACTIVE

## 2023-11-07 MED ORDER — FLUCONAZOLE 100 MG PO TABS: 200.0000 mg | ORAL_TABLET | Freq: Every day | ORAL | Status: DC

## 2023-11-07 MED ORDER — ENSURE ENLIVE PO LIQD
237.0000 mL | Freq: Two times a day (BID) | ORAL | Status: DC
  Administered 2023-11-07 – 2023-11-09 (×6): 237 mL via ORAL

## 2023-11-07 MED ORDER — FENTANYL CITRATE PF 50 MCG/ML IJ SOSY
12.5000 ug | PREFILLED_SYRINGE | INTRAMUSCULAR | Status: DC | PRN
  Administered 2023-11-10: 12.5 ug via INTRAVENOUS
  Filled 2023-11-07: qty 1

## 2023-11-07 MED ORDER — LANTUS SOLOSTAR 100 UNIT/ML ~~LOC~~ SOPN: 20.0000 [IU] | PEN_INJECTOR | Freq: Two times a day (BID) | SUBCUTANEOUS | Status: AC

## 2023-11-07 MED ORDER — CHLORHEXIDINE GLUCONATE CLOTH 2 % EX PADS
6.0000 | MEDICATED_PAD | Freq: Every day | CUTANEOUS | Status: DC
  Administered 2023-11-07 – 2023-11-13 (×6): 6 via TOPICAL

## 2023-11-07 MED ORDER — DOXYCYCLINE HYCLATE 100 MG PO CAPS: 100.0000 mg | ORAL_CAPSULE | Freq: Two times a day (BID) | ORAL | 0 refills | Status: AC

## 2023-11-07 MED ORDER — TOPIRAMATE 100 MG PO TABS
100.0000 mg | ORAL_TABLET | Freq: Two times a day (BID) | ORAL | Status: DC
  Administered 2023-11-07 – 2023-11-14 (×14): 100 mg via ORAL
  Filled 2023-11-07 (×14): qty 1

## 2023-11-07 MED ORDER — OXYCODONE HCL 5 MG PO TABS
5.0000 mg | ORAL_TABLET | Freq: Four times a day (QID) | ORAL | Status: DC | PRN
  Administered 2023-11-07 (×2): 5 mg via ORAL
  Filled 2023-11-07 (×2): qty 1

## 2023-11-07 MED ORDER — BISACODYL 10 MG RE SUPP
10.0000 mg | Freq: Once | RECTAL | Status: AC
  Administered 2023-11-07: 10 mg via RECTAL
  Filled 2023-11-07: qty 1

## 2023-11-07 MED ORDER — HALOPERIDOL LACTATE 5 MG/ML IJ SOLN
1.0000 mg | Freq: Four times a day (QID) | INTRAMUSCULAR | Status: DC | PRN
  Administered 2023-11-07 – 2023-11-11 (×6): 1 mg via INTRAVENOUS
  Filled 2023-11-07 (×8): qty 1

## 2023-11-07 MED ORDER — OXYCODONE HCL 5 MG PO TABS
5.0000 mg | ORAL_TABLET | Freq: Three times a day (TID) | ORAL | Status: DC | PRN
  Administered 2023-11-07 – 2023-11-10 (×4): 5 mg via ORAL
  Filled 2023-11-07 (×4): qty 1

## 2023-11-07 NOTE — Progress Notes (Signed)
~  1700: Found patient at doorway holding onto IV pole asking where his ride is. When this nurse reached patient, he began to become unsteady on feet and lean, requiring max assistance to keep from falling. Two additional nurses entered room to help transition patient to a wheelchair. When asked why he got out of bed, patient responded "I don't know." Patient stood and pivoted to bedside with 2-person assist. Bed alarm was reset. Dr. Standley Dakins, MD notified.

## 2023-11-07 NOTE — TOC Initial Note (Signed)
Transition of Care [facility]) - Initial/Assessment Note    Patient Details  Name: Brian Nielsen MRN: [id number] Date of Birth: Feb 08, 1961  Transition of Care (TOC) CM/SW Contact:    Catalina Gravel, LCSW Phone Number: 11/07/2023, 11:07 AM  Clinical Narrative:                 Pt is high risk for readmission. CSW completed assessment with pt at bedside. Pt lives with spouse in a single home, he reports no stairs.  He states that he is mobile.  Pt is mostly independent in completing his  ADLs. Pts spouse is able to provide transportation to appointments, he does not drive. Pt reports CPAP is only DME and he does not need anything else at this time.  CSW explained that if he needs any DME to follow up with  PCP if he has been D/C. TOC to follow.    Expected Discharge Plan: Home/Self Care Barriers to Discharge: Continued Medical Work up   Patient Goals and CMS Choice Patient states their goals for this hospitalization and ongoing recovery are:: Return Home          Expected Discharge Plan and Services In-house Referral: Clinical Social Work     Living arrangements for the past 2 months: Single Family Home                                      Prior Living Arrangements/Services Living arrangements for the past 2 months: Single Family Home Lives with:: Spouse Patient language and need for interpreter reviewed:: Yes Do you feel safe going back to the place where you live?: Yes      Need for Family Participation in Patient Care: Yes (Comment) Care giver support system in place?: Yes (comment)   Criminal Activity/Legal Involvement Pertinent to Current Situation/Hospitalization: No - Comment as needed  Activities of Daily Living   ADL Screening (condition at time of admission) Independently performs ADLs?: Yes (appropriate for developmental age) Is the patient deaf or have difficulty hearing?: No Does the patient have difficulty seeing, even when wearing glasses/contacts?:  No Does the patient have difficulty concentrating, remembering, or making decisions?: No  Permission Sought/Granted                  Emotional Assessment   Attitude/Demeanor/Rapport: Gracious Affect (typically observed): Accepting Orientation: : Oriented to Self, Oriented to Situation, Oriented to Place Alcohol / Substance Use: Not Applicable    Admission diagnosis:  Delirium [R41.0] AKI (acute kidney injury) (HCC) [N17.9] Fall, initial encounter [W19.XXXA] Acute metabolic encephalopathy [G93.41] Patient Active Problem List   Diagnosis Date Noted   Acute metabolic encephalopathy 11/06/2023   Acute kidney injury superimposed on CKD (HCC) 11/06/2023   Fall at home, initial encounter 11/06/2023   Rib fractures 11/06/2023   Suprapubic catheter (HCC) 11/06/2023   Transaminitis 11/06/2023   Hyponatremia 11/06/2023   Oropharynx cancer (HCC) 10/24/2023   OSA (obstructive sleep apnea) 12/28/2022   Extensor carpi ulnaris tendinitis 05/06/2022   Loss of weight    Polyp of ascending colon    Gastritis without bleeding    Symptomatic anemia 05/28/2021   Thoracic spondylosis without myelopathy 11/22/2016   Vitamin D deficiency 11/22/2016   HLD (hyperlipidemia) 10/19/2016   BPH (benign prostatic hyperplasia) 10/19/2016   Essential hypertension, benign 10/19/2016   Diabetes mellitus with stage 3 chronic kidney disease (HCC) 10/19/2016   Celiac sprue 05/23/2014  Anemia, iron deficiency 04/19/2014   GERD (gastroesophageal reflux disease) 04/19/2014   PCP:  Chyrl Civatte, FNP Pharmacy:   [facility] - [place], [place] - [address] [place] [address] Phone: [phone] Fax: [phone]  [facility] - [place], [place] - [address] [place] [address] Phone: [phone] Fax: [phone]     Social Drivers of Health (SDOH) Social History: SDOH Screenings   Food Insecurity:  No Food Insecurity (11/06/2023)  Housing: Unknown (11/06/2023)  Transportation Needs: No Transportation Needs (11/06/2023)  Utilities: Not At Risk (11/06/2023)  Financial Resource Strain: Low Risk  (02/08/2023)   Received from [facility]  Physical Activity: Patient Declined (02/08/2023)   Received from [facility], [facility]  Social Connections: Socially Integrated (02/08/2023)   Received from [facility], [facility]  Stress: No Stress Concern Present (02/08/2023)   Received from [facility], [facility]  Tobacco Use: High Risk (11/06/2023)   SDOH Interventions:     Readmission Risk Interventions    11/07/2023   11:04 AM  Readmission Risk Prevention Plan  Transportation Screening Complete  PCP or Specialist Appt within 3-5 Days Complete  HRI or Home Care Consult Complete  Social Work Consult for Recovery Care Planning/Counseling Complete  Palliative Care Screening Not Applicable  Medication Review Oceanographer) Complete

## 2023-11-07 NOTE — Discharge Instructions (Signed)
IMPORTANT INFORMATION: PAY CLOSE ATTENTION   PHYSICIAN DISCHARGE INSTRUCTIONS  Follow with Primary care provider  Chyrl Civatte, FNP  and other consultants as instructed by your Hospitalist Physician  SEEK MEDICAL CARE OR RETURN TO EMERGENCY ROOM IF SYMPTOMS COME BACK, WORSEN OR NEW PROBLEM DEVELOPS   Please note: You were cared for by a hospitalist during your hospital stay. Every effort will be made to forward records to your primary care provider.  You can request that your primary care provider send for your hospital records if they have not received them.  Once you are discharged, your primary care physician will handle any further medical issues. Please note that NO REFILLS for any discharge medications will be authorized once you are discharged, as it is imperative that you return to your primary care physician (or establish a relationship with a primary care physician if you do not have one) for your post hospital discharge needs so that they can reassess your need for medications and monitor your lab values.  Please get a complete blood count and chemistry panel checked by your Primary MD at your next visit, and again as instructed by your Primary MD.  Get Medicines reviewed and adjusted: Please take all your medications with you for your next visit with your Primary MD  Laboratory/radiological data: Please request your Primary MD to go over all hospital tests and procedure/radiological results at the follow up, please ask your primary care provider to get all Hospital records sent to his/her office.  In some cases, they will be blood work, cultures and biopsy results pending at the time of your discharge. Please request that your primary care provider follow up on these results.  If you are diabetic, please bring your blood sugar readings with you to your follow up appointment with primary care.    Please call and make your follow up appointments as soon as possible.    Also  Note the following: If you experience worsening of your admission symptoms, develop shortness of breath, life threatening emergency, suicidal or homicidal thoughts you must seek medical attention immediately by calling 911 or calling your MD immediately  if symptoms less severe.  You must read complete instructions/literature along with all the possible adverse reactions/side effects for all the Medicines you take and that have been prescribed to you. Take any new Medicines after you have completely understood and accpet all the possible adverse reactions/side effects.   Do not drive when taking Pain medications or sleeping medications (Benzodiazepines)  Do not take more than prescribed Pain, Sleep and Anxiety Medications. It is not advisable to combine anxiety,sleep and pain medications without talking with your primary care practitioner  Special Instructions: If you have smoked or chewed Tobacco  in the last 2 yrs please stop smoking, stop any regular Alcohol  and or any Recreational drug use.  Wear Seat belts while driving.  Do not drive if taking any narcotic, mind altering or controlled substances or recreational drugs or alcohol.

## 2023-11-07 NOTE — Evaluation (Addendum)
Physical Therapy Evaluation Patient Details Name: Brian Nielsen MRN: [id number] DOB: [birth year] Today's Date: 11/07/2023  History of Present Illness  Brian Nielsen is a 62 y.o. male with medical history significant for HTN, DM, CKD3, head and neck cancer, anemia, suprapubic catheter.  Patient presented to the ED with complaints of a fall and confusion.  On my evaluation patient is awake alert but fidgety, able to answer simple questions, he remembers falling but not give a lot of history.  Spouse and son who are at bedside.  Confusion started about a week ago, with patient saying things that did not make sense, they thought this was related to patient's pain medication - they are not sure exactly how much he was taking but they believe he was taking more than prescribed.  He fell about a week ago, and then fell again yesterday.  They report yesterday he was standing and weaving about the bed and fell.  Patient denies dizziness, denies chest pain no difficulty breathing, but he is having pain to his left side of his chest.  They report a chronic productive cough that is unchanged related to his throat cancer and treatment. Spouse reports some initial difficulty breathing today on EMS arrival, but this had resolved on arrival to the ED.  He is on ulcers in his mouth, but the report reports stable oral intake.  No vomiting.  He is constipated.  No fevers.   Clinical Impression  Patient demonstrates slow labored movement for sitting up at bedside mostly due to left sided rib cage pain, once on feet demonstrates good return for ambulating in room/hallway using RW without loss of balance and tolerated sitting up in chair after therapy - nursing staff notified.  PLAN:  Patient to be discharged home today and discharged from acute physical therapy to care of nursing for ambulation as tolerated for length of stay with recommendations stated below          If plan is discharge home, recommend the following: A  little help with walking and/or transfers;A little help with bathing/dressing/bathroom;Help with stairs or ramp for entrance;Assistance with cooking/housework   Can travel by private vehicle        Equipment Recommendations None recommended by PT  Recommendations for Other Services       Functional Status Assessment Patient has had a recent decline in their functional status and demonstrates the ability to make significant improvements in function in a reasonable and predictable amount of time.     Precautions / Restrictions Precautions Precautions: Fall Restrictions Weight Bearing Restrictions Per Provider Order: No      Mobility  Bed Mobility Overal bed mobility: Needs Assistance Bed Mobility: Supine to Sit, Sit to Supine     Supine to sit: Contact guard Sit to supine: Contact guard assist   General bed mobility comments: increased time, labored movement, increased left sided rib cage pain    Transfers Overall transfer level: Needs assistance Equipment used: Rolling walker (2 wheels) Transfers: Sit to/from Stand, Bed to chair/wheelchair/BSC Sit to Stand: Contact guard assist   Step pivot transfers: Contact guard assist       General transfer comment: labored movement, increased time    Ambulation/Gait Ambulation/Gait assistance: Contact guard assist, Min assist Gait Distance (Feet): 75 Feet Assistive device: Rolling walker (2 wheels) Gait Pattern/deviations: Decreased step length - right, Decreased step length - left, Decreased stride length Gait velocity: decreased     General Gait Details: slightly labored cadence with fair/good return for  ambulating in room/hallway without loss of balance  Stairs            Wheelchair Mobility     Tilt Bed    Modified Rankin (Stroke Patients Only)       Balance Overall balance assessment: Needs assistance Sitting-balance support: Feet supported, No upper extremity supported Sitting balance-Leahy Scale:  Good Sitting balance - Comments: seated at EOB   Standing balance support: Reliant on assistive device for balance, During functional activity, Bilateral upper extremity supported Standing balance-Leahy Scale: Fair Standing balance comment: fair/good using RW                             Pertinent Vitals/Pain Pain Assessment Pain Assessment: 0-10 Pain Score: 9  Pain Location: left side of rib cage Pain Descriptors / Indicators: Discomfort, Grimacing, Guarding, Sore Pain Intervention(s): Limited activity within patient's tolerance, Monitored during session, Repositioned    Home Living Family/patient expects to be discharged to:: Private residence Living Arrangements: Spouse/significant other Available Help at Discharge: Family;Available 24 hours/day Type of Home: Mobile home Home Access: Level entry       Home Layout: One level Home Equipment: Agricultural consultant (2 wheels);BSC/3in1;Grab bars - tub/shower      Prior Function Prior Level of Function : Needs assist       Physical Assist : Mobility (physical) Mobility (physical): Bed mobility;Transfers;Gait;Stairs   Mobility Comments: Household ambulation using RW PRN ADLs Comments: Assisted by family     Extremity/Trunk Assessment   Upper Extremity Assessment Upper Extremity Assessment: Overall WFL for tasks assessed    Lower Extremity Assessment Lower Extremity Assessment: Generalized weakness    Cervical / Trunk Assessment Cervical / Trunk Assessment: Normal  Communication   Communication Communication: No apparent difficulties  Cognition Arousal: Alert Behavior During Therapy: WFL for tasks assessed/performed Overall Cognitive Status: Within Functional Limits for tasks assessed                                          General Comments      Exercises     Assessment/Plan    PT Assessment Patient needs continued PT services  PT Problem List Decreased strength;Decreased activity  tolerance;Decreased balance;Decreased mobility       PT Treatment Interventions DME instruction;Gait training;Stair training;Functional mobility training;Therapeutic activities;Therapeutic exercise;Balance training;Patient/family education    PT Goals (Current goals can be found in the Care Plan section)  Acute Rehab PT Goals Patient Stated Goal: return home with family to assist PT Goal Formulation: With patient Time For Goal Achievement: 11/07/23 Potential to Achieve Goals: Good         Co-evaluation               AM-PAC PT "6 Clicks" Mobility  Outcome Measure Help needed turning from your back to your side while in a flat bed without using bedrails?: A Little Help needed moving from lying on your back to sitting on the side of a flat bed without using bedrails?: A Little Help needed moving to and from a bed to a chair (including a wheelchair)?: A Little Help needed standing up from a chair using your arms (e.g., wheelchair or bedside chair)?: A Little Help needed to walk in hospital room?: A Little Help needed climbing 3-5 steps with a railing? : A Little 6 Click Score: 18    End of Session  Activity Tolerance: Patient tolerated treatment well;Patient limited by fatigue Patient left: in chair;with call bell/phone within reach Nurse Communication: Mobility status PT Visit Diagnosis: Unsteadiness on feet (R26.81);Other abnormalities of gait and mobility (R26.89);Muscle weakness (generalized) (M62.81)    Time: 9629-5284 PT Time Calculation (min) (ACUTE ONLY): 28 min   Charges:   PT Evaluation $PT Eval Moderate Complexity: 1 Mod PT Treatments $Therapeutic Activity: 23-37 mins PT General Charges $$ ACUTE PT VISIT: 1 Visit         3:38 PM, 11/07/23 Ocie Bob, MPT Physical Therapist with [facility] [phone] office [phone] mobile phone

## 2023-11-07 NOTE — Plan of Care (Signed)

## 2023-11-07 NOTE — Plan of Care (Signed)

## 2023-11-07 NOTE — Progress Notes (Signed)
Patient asleep , did not furnish CPAP due to late nature.

## 2023-11-07 NOTE — Discharge Summary (Signed)
Physician Discharge Summary  Brian Nielsen QIO:[id number] DOB: [birth year] DOA: 11/06/2023  PCP: Chyrl Civatte, FNP Nephrologist: Dr. Wolfgang Phoenix GI: Dr. Servando Snare at [facility]  Oncologist: [facility]  Admit date: 11/06/2023 Discharge date: 11/07/2023  Admitted From:  HOME  Disposition: HOME   Recommendations for Outpatient Follow-up:  Follow up with PCP in 1 weeks Follow up with nephrology in 1-2 weeks Follow up with Dr. Servando Snare GI at [facility] in 2-3 weeks  Please obtain BMP/CBC/LFTs in 1 week on outpatient follow up  Discharge Condition: STABLE   CODE STATUS: FULL  DIET: resume previous home diet    Brief Hospitalization Summary: Please see all hospital notes, images, labs for full details of the hospitalization. Admission provider HPI:  62 y.o. male with medical history significant for HTN, DM, CKD3, head and neck cancer, anemia, suprapubic catheter.  Patient presented to the ED with complaints of a fall and confusion.   On my evaluation patient is awake alert but fidgety, able to answer simple questions, he remembers falling but not give a lot of history.  Spouse and son who are at bedside.  Confusion started about a week ago, with patient saying things that did not make sense, they thought this was related to patient's pain medication - they are not sure exactly how much he was taking but they believe he was taking more than prescribed.  He fell about a week ago, and then fell again yesterday.  They report yesterday he was standing and weaving about the bed and fell.  Patient denies dizziness, denies chest pain no difficulty breathing, but he is having pain to his left side of his chest.  They report a chronic productive cough that is unchanged related to his throat cancer and treatment. Spouse reports some initial difficulty breathing today on EMS arrival, but this had resolved on arrival to the ED.   He is on ulcers in his mouth, but the report reports stable oral intake.  No vomiting.  He is constipated.  No  fevers.   ED Course: Blood pressure systolic down to 32/44 improved with fluid bolus currently low 90s.  O2 sats-documented at 88% on room air- per EDP- was an error. Patient was 98% on room on my air evaluation.  Creatinine elevated at 5.38.  Sodium 129.   Mildly elevated liver enzymes AST 198, ALT 183.  Lactic acid 2.1.  Pelvic X-ray negative for acute abnormality.  CT chest abdomen and pelvis-acute fractures-posterior left 10th and 11th ribs, small volume pleural fluid within left base-fluid measures water density.  No pneumothorax.  Multifocal subsegmental atelectasis, groundglass attenuation posterior consolidative change left lung-large contusion or atelectasis.  IV ceftriaxone and azithromycin started.  1.5 L bolus given.  Hospital Course: Pt was admitted with acute metabolic encephalopathy likely related to opioid medications. He was dehydrated and hypotensive and severely constipated.  He as treated with gentle IV fluid hydration and treated for pneumonia with IV antibiotics.  He received aggressive laxatives and has had a large bowel movement.  He was seen by inpatient GI service regarding his LFTs.  For now we are holding atorvastatin.  He presented with AKI on CKD stage IV and his creatinine has come down some with hydration.  His mentation has returned to baseline and he is feeling much better.  He expresses that he wishes to go home now.  He reports that he will follow up with his nephrologist Dr. Wolfgang Phoenix and his GI Dr. Servando Snare regarding his renal function and LFTs but he  says he wants to discharge today.  He has been eating and drinking well.  He reports that his pain is controlled and he has outpatient follow up with his [facility] oncology team already arranged.  He says that he has completed his final treatment of chemotherapy and he feels well to go home.  I urged him to please make sure and follow up in the next week for repeat labs and he verbalized understanding.   Discharge Diagnoses:  Principal  Problem:   Acute metabolic encephalopathy Active Problems:   BPH (benign prostatic hyperplasia)   Essential hypertension, benign   Diabetes mellitus with stage 3 chronic kidney disease (HCC)   OSA (obstructive sleep apnea)   Oropharynx cancer (HCC)   Acute kidney injury superimposed on CKD (HCC)   Fall at home, initial encounter   Rib fractures   Suprapubic catheter (HCC)   Transaminitis   Hyponatremia   Discharge Instructions:  Allergies as of 11/07/2023       Reactions   Codeine Rash        Medication List     STOP taking these medications    atorvastatin 40 MG tablet Commonly known as: LIPITOR   fluconazole 200 MG tablet Commonly known as: DIFLUCAN   Jardiance 10 MG Tabs tablet Generic drug: empagliflozin   lisinopril 20 MG tablet Commonly known as: ZESTRIL   mirabegron ER 50 MG Tb24 tablet Commonly known as: MYRBETRIQ       TAKE these medications    amitriptyline 25 MG tablet Commonly known as: ELAVIL Take 25 mg by mouth at bedtime.   aspirin EC 81 MG tablet Take 81 mg by mouth daily.   cloNIDine 0.2 MG tablet Commonly known as: CATAPRES Take 0.2 mg by mouth 2 (two) times daily.   doxycycline 100 MG capsule Commonly known as: VIBRAMYCIN Take 1 capsule (100 mg total) by mouth 2 (two) times daily for 3 days.   DULoxetine 20 MG capsule Commonly known as: CYMBALTA Take 20 mg by mouth daily.   gabapentin 300 MG capsule Commonly known as: NEURONTIN Take 300 mg by mouth 3 times/day as needed-between meals & bedtime.   Lantus SoloStar 100 UNIT/ML Solostar Pen Generic drug: insulin glargine Inject 20 Units into the skin 2 (two) times daily. What changed: how much to take   lidocaine 2 % solution Commonly known as: XYLOCAINE Mix equal parts diphenhydramine, lidocaine, and liquid antacid. Swish 5 to in mouth for 60 seconds, every 4 to 6 hours as needed, then swallow or spit mixture out (as directed by prescriber). Shake well before  using. Refrigerate after mixing and discard after 14 days.   linaclotide 290 MCG Caps capsule Commonly known as: LINZESS Take 1 capsule (290 mcg total) by mouth daily before breakfast.   mometasone 0.1 % cream Commonly known as: ELOCON Apply 1 Application topically 2 (two) times daily.   Mounjaro 15 MG/0.5ML Pen Generic drug: tirzepatide Inject 15 mg into the skin once a week.   ondansetron 8 MG tablet Commonly known as: ZOFRAN Take 8 mg by mouth every 8 (eight) hours as needed for nausea or vomiting.   oxyCODONE 5 MG immediate release tablet Commonly known as: Oxy IR/ROXICODONE Take 10 mg by mouth every 4 (four) hours as needed for moderate pain (pain score 4-6).   oxyCODONE ER 9 MG C12a Take 1 capsule by mouth every 12 (twelve) hours as needed (pain).   pantoprazole 40 MG tablet Commonly known as: PROTONIX Take 1 tablet by mouth daily.  prochlorperazine 10 MG tablet Commonly known as: COMPAZINE Take 10 mg by mouth every 8 (eight) hours as needed for vomiting or nausea.   Sodium Fluoride 5000 PPM 1.1 % Crea dental cream Generic drug: sodium fluoride Place 1 Application onto teeth at bedtime.   topiramate 100 MG tablet Commonly known as: TOPAMAX Take 100 mg by mouth 2 (two) times daily.        Follow-up Information     Midge Minium, MD. Schedule an appointment as soon as possible for a visit in 2 week(s).   Specialty: Gastroenterology Why: Hospital Follow Up Contact information: [address] [place]  [place] [address] [phone]         Randa Lynn, MD. Schedule an appointment as soon as possible for a visit in 2 week(s).   Specialty: Nephrology Why: Hospital Follow Up Contact information: [address] [place] [address] [phone]         Chyrl Civatte, FNP Follow up in 1 week(s).   Specialty: Family Medicine Why: Hospital Follow Up Contact information: [address] [place]  [id number] [phone]                Allergies  Allergen Reactions   Codeine Rash   Allergies as of 11/07/2023       Reactions   Codeine Rash        Medication List     STOP taking these medications    atorvastatin 40 MG tablet Commonly known as: LIPITOR   fluconazole 200 MG tablet Commonly known as: DIFLUCAN   Jardiance 10 MG Tabs tablet Generic drug: empagliflozin   lisinopril 20 MG tablet Commonly known as: ZESTRIL   mirabegron ER 50 MG Tb24 tablet Commonly known as: MYRBETRIQ       TAKE these medications    amitriptyline 25 MG tablet Commonly known as: ELAVIL Take 25 mg by mouth at bedtime.   aspirin EC 81 MG tablet Take 81 mg by mouth daily.   cloNIDine 0.2 MG tablet Commonly known as: CATAPRES Take 0.2 mg by mouth 2 (two) times daily.   doxycycline 100 MG capsule Commonly known as: VIBRAMYCIN Take 1 capsule (100 mg total) by mouth 2 (two) times daily for 3 days.   DULoxetine 20 MG capsule Commonly known as: CYMBALTA Take 20 mg by mouth daily.   gabapentin 300 MG capsule Commonly known as: NEURONTIN Take 300 mg by mouth 3 times/day as needed-between meals & bedtime.   Lantus SoloStar 100 UNIT/ML Solostar Pen Generic drug: insulin glargine Inject 20 Units into the skin 2 (two) times daily. What changed: how much to take   lidocaine 2 % solution Commonly known as: XYLOCAINE Mix equal parts diphenhydramine, lidocaine, and liquid antacid. Swish 5 to in mouth for 60 seconds, every 4 to 6 hours as needed, then swallow or spit mixture out (as directed by prescriber). Shake well before using. Refrigerate after mixing and discard after 14 days.   linaclotide 290 MCG Caps capsule Commonly known as: LINZESS Take 1 capsule (290 mcg total) by mouth daily before breakfast.   mometasone 0.1 % cream Commonly known as: ELOCON Apply 1 Application topically 2 (two) times daily.   Mounjaro 15 MG/0.5ML Pen Generic drug:  tirzepatide Inject 15 mg into the skin once a week.   ondansetron 8 MG tablet Commonly known as: ZOFRAN Take 8 mg by mouth every 8 (eight) hours as needed for nausea or vomiting.   oxyCODONE 5 MG  immediate release tablet Commonly known as: Oxy IR/ROXICODONE Take 10 mg by mouth every 4 (four) hours as needed for moderate pain (pain score 4-6).   oxyCODONE ER 9 MG C12a Take 1 capsule by mouth every 12 (twelve) hours as needed (pain).   pantoprazole 40 MG tablet Commonly known as: PROTONIX Take 1 tablet by mouth daily.   prochlorperazine 10 MG tablet Commonly known as: COMPAZINE Take 10 mg by mouth every 8 (eight) hours as needed for vomiting or nausea.   Sodium Fluoride 5000 PPM 1.1 % Crea dental cream Generic drug: sodium fluoride Place 1 Application onto teeth at bedtime.   topiramate 100 MG tablet Commonly known as: TOPAMAX Take 100 mg by mouth 2 (two) times daily.       Procedures/Studies: CT HEAD WO CONTRAST ( ) Result Date: 11/06/2023 CLINICAL DATA:  Patient fell 2 days ago. History of head neck cancer. Encephalopathy. EXAM: CT HEAD WITHOUT CONTRAST TECHNIQUE: Contiguous axial images were obtained from the base of the skull through the vertex without intravenous contrast. RADIATION DOSE REDUCTION: This exam was performed according to the departmental dose-optimization program which includes automated exposure control, adjustment of the mA and/or kV according to patient size and/or use of iterative reconstruction technique. COMPARISON:  05/12/2021 FINDINGS: Brain: There is no evidence for acute hemorrhage, hydrocephalus, mass lesion, or abnormal extra-axial fluid collection. No definite CT evidence for acute infarction. Vascular: No hyperdense vessel or unexpected calcification. Skull: No evidence for fracture. No worrisome lytic or sclerotic lesion. Sinuses/Orbits: The visualized paranasal sinuses and mastoid air cells are clear. Visualized portions of the globes and  intraorbital fat are unremarkable. Other: None. IMPRESSION: No acute intracranial abnormality. Electronically Signed   By: Kennith Center M.D.   On: 11/06/2023 16:29   DG Pelvis Portable Result Date: 11/06/2023 CLINICAL DATA:  Fall. EXAM: PORTABLE PELVIS 1-2 VIEWS COMPARISON:  Same day CT chest, abdomen, and pelvis. FINDINGS: There is no evidence of pelvic fracture or diastasis. The sacroiliac joints and pubic symphysis appear anatomically aligned. No pelvic bone lesions are seen. IMPRESSION: No acute osseous abnormality identified on AP pelvic radiograph. Electronically Signed   By: Hart Robinsons M.D.   On: 11/06/2023 14:09   DG Chest Port 1 View Result Date: 11/06/2023 CLINICAL DATA:  Fall. EXAM: PORTABLE CHEST 1 VIEW COMPARISON:  Same day CT chest, abdomen, and pelvis. Chest radiograph dated February 03, 2023. FINDINGS: Low lung volumes with accentuation of the cardiomediastinal silhouette. Overlapping telemetry wires. Patchy opacities at the left lung base. Blunting of the left costophrenic angle. The right lung appears clear. No pneumothorax. Posterior left tenth and eleventh rib fractures are not well visualized on this exam and better evaluated on the same-day CT chest, abdomen, and pelvis. IMPRESSION: 1. Posterior left tenth and eleventh rib fractures are not well visualized on this exam and better evaluated on the same-day CT chest, abdomen, and pelvis. No pneumothorax. 2. Patchy opacities at the left lung base could reflect contusion, infiltrate, or atelectasis. 3. Probable trace left effusion. Electronically Signed   By: Hart Robinsons M.D.   On: 11/06/2023 14:06   CT CHEST ABDOMEN PELVIS WO CONTRAST Result Date: 11/06/2023 CLINICAL DATA:  Fall 2 days ago. Blunt trauma. New onset hypoxia. Rib pain. EXAM: CT CHEST, ABDOMEN AND PELVIS WITHOUT CONTRAST TECHNIQUE: Multidetector CT imaging of the chest, abdomen and pelvis was performed following the standard protocol without IV contrast. RADIATION  DOSE REDUCTION: This exam was performed according to the departmental dose-optimization program which includes automated exposure control,  adjustment of the mA and/or kV according to patient size and/or use of iterative reconstruction technique. COMPARISON:  CT AP from 10/15/2022 FINDINGS: CT CHEST FINDINGS Cardiovascular: Heart size appears mildly enlarged. There is no pericardial effusion. Aortic atherosclerotic calcifications and coronary artery calcifications. Mediastinum/Nodes: No enlarged mediastinal, hilar, or axillary lymph nodes. Thyroid gland, trachea, and esophagus demonstrate no significant findings. Lungs/Pleura: Small volume of pleural fluid is identified within the left base the fluid measures water density. Lingular atelectasis. In the left lower lobe there are multifocal areas of subsegmental atelectasis, ground-glass attenuation, and posterior consolidative change. No pneumothorax identified. Musculoskeletal: There are acute fractures involving the posterior aspect of the left tenth and eleventh ribs posteriorly, image 53/2 and image 58/2. The thoracic vertebral body heights are well preserved without signs of acute fracture. Intact sternum. CT ABDOMEN PELVIS FINDINGS Hepatobiliary: No focal liver abnormality is seen. Status post cholecystectomy. No biliary dilatation. Pancreas: Unremarkable. No pancreatic ductal dilatation or surrounding inflammatory changes. Spleen: No splenic injury or perisplenic hematoma. Adrenals/Urinary Tract: No adrenal hemorrhage or renal injury identified. The bladder is partially decompressed around a suprapubic catheter. Stomach/Bowel: Stomach is unremarkable. No pathologic dilatation of the large or small bowel loops. The appendix is visualized and is normal. There is a moderate to large stool burden identified within the ascending colon. No bowel wall thickening or inflammatory changes identified Vascular/Lymphatic: Aortic atherosclerosis. No enlarged abdominal or  pelvic lymph nodes. Reproductive: Prostate is unremarkable. Other: No free fluid or fluid collections. Small fat containing umbilical hernia. Musculoskeletal: No fracture is seen. IMPRESSION: 1. Acute fractures involving the posterior aspect of the left tenth and eleventh ribs posteriorly. 2. Small volume of pleural fluid is identified within the left base. The fluid measures water density. No pneumothorax identified. 3. Multifocal areas of subsegmental atelectasis, ground-glass attenuation, and posterior consolidative change within the left lower lobe. Findings may reflect pulmonary contusion and/or atelectasis. 4. No acute findings within the abdomen or pelvis. 5. Moderate to large stool burden identified within the ascending colon. Correlate for any clinical signs or symptoms of constipation. 6. Coronary artery calcifications. 7.  Aortic Atherosclerosis (ICD10-I70.0). Electronically Signed   By: Signa Kell M.D.   On: 11/06/2023 13:56     Subjective: Pt says he wants to go home, he had a very large BM after the suppository and is feeling better now.  He says he will follow up with his GI at [facility] to follow up liver and with Dr. Wolfgang Phoenix to follow up kidney function.     Discharge Exam: Vitals:   11/07/23 0431 11/07/23 1328  BP: (!) 115/59 (!) 141/73  Pulse: 85 80  Resp:    Temp: 98.8 F (37.1 C) 98.3 F (36.8 C)  SpO2: 93% 97%   Vitals:   11/06/23 1934 11/06/23 2246 11/07/23 0431 11/07/23 1328  BP: 93/66 109/63 (!) 115/59 (!) 141/73  Pulse: 96 95 85 80  Resp:      Temp: 98.4 F (36.9 C) 98.6 F (37 C) 98.8 F (37.1 C) 98.3 F (36.8 C)  TempSrc: Oral Oral Oral Oral  SpO2: 94% 91% 93% 97%   General: Pt is alert, awake, not in acute distress Cardiovascular: normal S1/S2 +, no rubs, no gallops Respiratory: CTA bilaterally, no wheezing, no rhonchi Abdominal: Soft, NT, ND, bowel sounds + Extremities: no edema, no cyanosis   The results of significant diagnostics from this  hospitalization (including imaging, microbiology, ancillary and laboratory) are listed below for reference.     Microbiology: No results found  for this or any previous visit (from the past 240 hours).   Labs: BNP (last 3 results) No results for input(s): "BNP" in the last 8760 hours. Basic Metabolic Panel: Recent Labs  Lab 11/06/23 1322 11/07/23 0427  NA 129* 135  K 5.6* 4.7  CL 99 107  CO2 17* 20*  GLUCOSE 123* 51*  BUN 85* 74*  CREATININE 5.38* 4.41*  CALCIUM 8.4* 8.0*   Liver Function Tests: Recent Labs  Lab 11/06/23 1322 11/07/23 0427  AST 198* 114*  ALT 183* 139*  ALKPHOS 73 63  BILITOT 0.4 0.5  PROT 5.7* 4.9*  ALBUMIN 2.4* 1.9*   No results for input(s): "LIPASE", "AMYLASE" in the last 168 hours. No results for input(s): "AMMONIA" in the last 168 hours. CBC: Recent Labs  Lab 11/06/23 1322 11/07/23 0427  WBC 5.0 5.7  HGB 9.0* 8.0*  HCT 28.1* 25.1*  MCV 93.0 94.0  PLT 292 199   Cardiac Enzymes: No results for input(s): "CKTOTAL", "CKMB", "CKMBINDEX", "TROPONINI" in the last 168 hours. BNP: Invalid input(s): "POCBNP" CBG: Recent Labs  Lab 11/06/23 2116 11/07/23 0836 11/07/23 1134 11/07/23 1212  GLUCAP 84 99 270* 237*   D-Dimer No results for input(s): "DDIMER" in the last 72 hours. Hgb A1c No results for input(s): "HGBA1C" in the last 72 hours. Lipid Profile No results for input(s): "CHOL", "HDL", "LDLCALC", "TRIG", "CHOLHDL", "LDLDIRECT" in the last 72 hours. Thyroid function studies No results for input(s): "TSH", "T4TOTAL", "T3FREE", "THYROIDAB" in the last 72 hours.  Invalid input(s): "FREET3" Anemia work up No results for input(s): "VITAMINB12", "FOLATE", "FERRITIN", "TIBC", "IRON", "RETICCTPCT" in the last 72 hours. Urinalysis    Component Value Date/Time   COLORURINE AMBER (A) 11/06/2023 1322   APPEARANCEUR TURBID (A) 11/06/2023 1322   APPEARANCEUR Clear 12/14/2022 1529   LABSPEC 1.017 11/06/2023 1322   PHURINE 5.0 11/06/2023  1322   GLUCOSEU NEGATIVE 11/06/2023 1322   HGBUR SMALL (A) 11/06/2023 1322   BILIRUBINUR NEGATIVE 11/06/2023 1322   BILIRUBINUR Negative 12/14/2022 1529   KETONESUR NEGATIVE 11/06/2023 1322   PROTEINUR >=300 (A) 11/06/2023 1322   NITRITE NEGATIVE 11/06/2023 1322   LEUKOCYTESUR LARGE (A) 11/06/2023 1322   Sepsis Labs Recent Labs  Lab 11/06/23 1322 11/07/23 0427  WBC 5.0 5.7   Microbiology No results found for this or any previous visit (from the past 240 hours).  Time coordinating discharge:  35 mins  SIGNED:  Standley Dakins, MD  [facility] 11/07/2023, 3:27 PM How to contact the [facility] Attending or Consulting provider 7A - 7P or covering provider during after hours 7P -7A, for this patient?  Check the care team in [facility] and look for a) attending/consulting [facility] provider listed and b) the [facility] team listed Log into [email or web address] and use [facility]'s universal password to access. If you do not have the password, please contact the hospital operator. Locate the [facility] provider you are looking for under [facility] and page to a number that you can be directly reached. If you still have difficulty reaching the provider, please page the [facility] (Director on Call) for the Hospitalists listed on amion for assistance.

## 2023-11-07 NOTE — Consult Note (Signed)
Gastroenterology Consult   Referring Provider: No ref. provider found Primary Care Physician:  Chyrl Civatte, FNP Primary Gastroenterologist:  Dr. Midge Minium with [facility]  Patient ID: Brian Nielsen; [id number]; [birth year]   Admit date: 11/06/2023  LOS: 1 day   Date of Consultation: 11/07/2023  Reason for Consultation:  acute transaminitis    History of Present Illness   Brian Nielsen is a 62 y.o. male with PMH signifcant for GERD, constipation, celiac disease, gastroparesis, oropharynx cancer recently completing radiation therapy and chemotherapy (carboplatin and paclitaxel) last week, anemia (receives EPO and IV iron), suprapubic catheter presenting with confusion and weakness and concern for left axillary pain following falls. Patient noted to have 2 rib factures, AKI, and pulmonary contusion. Decision made to admit for further monitoring, management. GI consulted this morning for elevated transaminases.   History of obtained from both patient and wife. Completed chemo and radiation about a week ago. For the past one week, he has been confused and weak. He has had multiple falls. Patient noted to be saying things that do not make sense. Family thought it was due to pain medicaiton, possibly taking more than prescribed.  Been given oxycodone initially but recently transitioning to tramadol due to constipation/fecal impaction. His appetite has been poor. He denies heartburn, vomiting. He has been having some feeling of fullness, constipation which he felt he was managing well at home. He uses Linzess and miralax one capful daily. He denies melena, brbpr. Patient states he does not follow gluten free diet. States it is too expensive.  Wife status is not clear if he been taking his medications correctly, too much or too little is possible.   In the ED: BP 97/55 initially. Lowest at 86/55, HR 87, Temp 98.2. oxygen sat initially 88% on RA.   Sodium 129 down from 137.  Creatinine 5.38 up from 2.06 one month prior, BUN 85 up from 41 one month prior. Tbili 0.4, AP 73, AST 198, ALT 183, Hgb 9 down from 10.1 one month prior, ethanol <10, U/A with many baceria, large LE, >50 WBC, lactic acid 2.1, INR 1.3. Today Hgb 8, AST 114, ALT 139.   Labs from 10/27/23: WBC 1.6, Hgb 9.3, Platelets 76, creatinine 1.81, ablumin 2.6 otherwise LFTs normal. In 09/2023: AST 51, ALT 82, Hep B surf Ag neg, Hep B surf Ab neg, Hep B core total Ab neg.  CT chest/abd/pelvis without contrast: no focal liver abnormlatiy, s/p cholecysteectomy, no biliary dilation, moderate to large stool burden, acute fractures of posterior aspect of left 10th and 11th ribs, findings of possible pulmonary contusion and/or atx.   CT head without contrast: no acute intracranial abnormality  Review of old records; 03/2020: TTG IgA 78 c/w poorly controlled celiac disease. History of postive HCV ab in 06/2021 with negative HCV RNA.   EGD 08/2021: -normal esophagus -erythematous mucosa in antrum s/p bx focally active gastritis, focal intestinal metaplasia, neg for h.pylori -normal duodenum s/p bx, small intestinal mucosa with focal mild active duodenitis, scattered round bodies present (concern for cryptosporidium infection) villous architecture preserved.   Colonoscopy 08/2021: Dr. Servando Snare -two 4-109mm polyps in ascending colon removed, tubular adenoma -one 3mm polyp sigmoid colon removed -next colonoscopy five years  Prior to Admission medications   Medication Sig Start Date End Date Taking? Authorizing Provider  amitriptyline (ELAVIL) 25 MG tablet Take 25 mg by mouth at bedtime. 06/16/23   [provider]  aspirin 81 MG EC tablet Take 81 mg by mouth  daily.    [provider]  atorvastatin (LIPITOR) 40 MG tablet Take 1 tablet (40 mg total) by mouth daily. Patient taking differently: Take 40 mg by mouth daily. 10/21/16   Eustace Moore, MD  cloNIDine (CATAPRES) 0.2 MG tablet Take 0.2 mg by mouth  2 (two) times daily. 12/15/22 10/10/23  [provider]  DULoxetine (CYMBALTA) 20 MG capsule Take by mouth. 09/27/23   [provider]  furosemide (LASIX) 20 MG tablet Take 40 mg by mouth 2 (two) times daily. 08/02/23   [provider]  gabapentin (NEURONTIN) 300 MG capsule Take 300 mg by mouth 3 times/day as needed-between meals & bedtime.    [provider]  insulin glargine (LANTUS SOLOSTAR) 100 UNIT/ML Solostar Pen Inject 35 Units into the skin 2 (two) times daily.    [provider]  JARDIANCE 10 MG TABS tablet Take 10 mg by mouth daily.    [provider]  lidocaine (XYLOCAINE) 2 % solution Mix equal parts diphenhydramine, lidocaine, and liquid antacid. Swish 5 to in mouth for 60 seconds, every 4 to 6 hours as needed, then swallow or spit mixture out (as directed by prescriber). Shake well before using. Refrigerate after mixing and discard after 14 days. 10/05/23   [provider]  linaclotide Karlene Einstein) 290 MCG CAPS capsule Take 1 capsule (290 mcg total) by mouth daily before breakfast. 09/30/23   Midge Minium, MD  lisinopril (ZESTRIL) 20 MG tablet Take 20 mg by mouth daily. 03/04/23   [provider]  ondansetron (ZOFRAN) 8 MG tablet Take by mouth. 09/21/23   [provider]  oxyCODONE ER 9 MG C12A Take by mouth. 10/06/23   [provider]  pantoprazole (PROTONIX) 40 MG tablet Take 1 tablet by mouth daily. 11/23/21   [provider]  prochlorperazine (COMPAZINE) 10 MG tablet Take by mouth. 09/21/23   [provider]  tirzepatide Greggory Keen) 15 MG/0.5ML Pen Inject 15 mg into the skin once a week. 09/05/23       Current Facility-Administered Medications  Medication Dose Route Frequency Provider Last Rate Last Admin   0.9 %  sodium chloride infusion   Intravenous Continuous Emokpae, Ejiroghene E, MD 75 mL/hr at 11/07/23 0332 Infusion Verify at 11/07/23 0332   azithromycin (ZITHROMAX) 500  mg in sodium chloride 0.9 % 250 mL IVPB  500 mg Intravenous Q24H Emokpae, Ejiroghene E, MD       cefTRIAXone (ROCEPHIN) 2 g in sodium chloride 0.9 % 100 mL IVPB  2 g Intravenous Q24H Emokpae, Ejiroghene E, MD       Chlorhexidine Gluconate Cloth 2 % PADS 6 each  6 each Topical Daily Johnson, Clanford L, MD       feeding supplement (ENSURE ENLIVE / ENSURE PLUS) liquid 237 mL  237 mL Oral BID BM Emokpae, Ejiroghene E, MD       fentaNYL (SUBLIMAZE) injection 12.5 mcg  12.5 mcg Intravenous Q2H PRN Johnson, Clanford L, MD       heparin injection 5,000 Units  5,000 Units Subcutaneous Q8H Emokpae, Ejiroghene E, MD   5,000 Units at 11/07/23 0618   insulin aspart (novoLOG) injection 0-5 Units  0-5 Units Subcutaneous QHS Emokpae, Ejiroghene E, MD       insulin aspart (novoLOG) injection 0-9 Units  0-9 Units Subcutaneous TID WC Emokpae, Ejiroghene E, MD       insulin glargine-yfgn (SEMGLEE) injection 10 Units  10 Units Subcutaneous BID Emokpae, Ejiroghene E, MD   10 Units at 11/07/23  0020   linaclotide (LINZESS) capsule 290 mcg  290 mcg Oral QAC breakfast Emokpae, Ejiroghene E, MD       ondansetron (ZOFRAN) tablet 4 mg  4 mg Oral Q6H PRN Emokpae, Ejiroghene E, MD       Or   ondansetron (ZOFRAN) injection 4 mg  4 mg Intravenous Q6H PRN Emokpae, Ejiroghene E, MD       oxyCODONE (Oxy IR/ROXICODONE) immediate release tablet 5 mg  5 mg Oral Q6H PRN Johnson, Clanford L, MD       pantoprazole (PROTONIX) EC tablet 40 mg  40 mg Oral Daily Emokpae, Ejiroghene E, MD       polyethylene glycol (MIRALAX / GLYCOLAX) packet 17 g  17 g Oral BID Emokpae, Ejiroghene E, MD   17 g at 11/06/23 2156   senna-docusate (Senokot-S) tablet 1 tablet  1 tablet Oral BID Emokpae, Ejiroghene E, MD   1 tablet at 11/06/23 2157    Allergies as of 11/06/2023 - Review Complete 11/06/2023  Allergen Reaction Noted   Duloxetine Other (See Comments) 12/19/2019   Gluten meal  08/19/2021   Codeine Rash 03/21/2021    Past Medical History:   Diagnosis Date   Adult celiac disease 2015   followed by dr Servando Snare (GI)   Anemia associated with chronic renal failure    hematologist--- dr Sarajane Jews;  treated with iron infusions   Benign localized prostatic hyperplasia with lower urinary tract symptoms (LUTS)    urologist--- dr Retta Diones   Cancer [facility]) 08/18/2023   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   Charcot foot due to diabetes mellitus (HCC)    Chronic constipation    CKD (chronic kidney disease), stage III [facility])    nephrologist--- dr Wolfgang Phoenix;  renal bx 11-20-2021   Diverticulosis of colon    Edema of both lower extremities    GERD (gastroesophageal reflux disease)    History of adenomatous polyp of colon    HTN (hypertension)    Hyperlipidemia, mixed    Peripheral neuropathy    Peyronie's disease    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 meatal stricture    Retinopathy due to secondary diabetes mellitus (HCC)    both eyes  treated with injecitons   Thoracic spondylosis without myelopathy 11/22/2016   Type 2 diabetes mellitus [facility])    endocrinologist--- dr nida   Vitamin D deficiency    Wears hearing aid in both ears     Past Surgical History:  Procedure Laterality Date   BIOPSY N/A 09/07/2021   Procedure: BIOPSY;  Surgeon: Midge Minium, MD;  Location: [facility];  Service: Endoscopy;  Laterality: N/A;   CATARACT EXTRACTION W/ INTRAOCULAR LENS IMPLANT Bilateral 2023   COLONOSCOPY N/A 05/03/2014   Procedure: COLONOSCOPY;  Surgeon: West Bali, MD;  Location: [facility];  Service: Endoscopy;  Laterality: N/A;  12:00   COLONOSCOPY WITH PROPOFOL N/A 09/07/2021   Procedure: COLONOSCOPY WITH PROPOFOL;  Surgeon: Midge Minium, MD;  Location: [facility];  Service: Endoscopy;  Laterality: N/A;   CYSTOSCOPY WITH INJECTION N/A 07/26/2023   Procedure: CYSTOSCOPY WITH BOTOX INJECTION 100 UNITS;  Surgeon: Noel Christmas, MD;  Location: [facility] ORS;  Service: Urology;  Laterality: N/A;  30 minutes    CYSTOSCOPY WITH URETHRAL DILATATION N/A 01/10/2023   Procedure: CYSTOSCOPY;  Surgeon: Marcine Matar, MD;  Location: [facility];  Service: Urology;  Laterality: N/A;   ESOPHAGOGASTRODUODENOSCOPY N/A 05/03/2014   Procedure: ESOPHAGOGASTRODUODENOSCOPY (EGD);  Surgeon: West Bali, MD;  Location: [facility];  Service: Endoscopy;  Laterality: N/A;   ESOPHAGOGASTRODUODENOSCOPY (EGD) WITH PROPOFOL N/A 06/03/2020   Procedure: ESOPHAGOGASTRODUODENOSCOPY (EGD) WITH PROPOFOL;  Surgeon: Regis Bill, MD;  Location: [facility] ENDOSCOPY;  Service: Endoscopy;  Laterality: N/A;   ESOPHAGOGASTRODUODENOSCOPY (EGD) WITH PROPOFOL N/A 09/07/2021   Procedure: ESOPHAGOGASTRODUODENOSCOPY (EGD) WITH PROPOFOL;  Surgeon: Midge Minium, MD;  Location: [facility];  Service: Endoscopy;  Laterality: N/A;  Diabetic   IR BONE MARROW BIOPSY & ASPIRATION  04/27/2023   MEATOTOMY N/A 01/10/2023   Procedure: MEATOTOMY ADULT;  Surgeon: Marcine Matar, MD;  Location: [facility];  Service: Urology;  Laterality: N/A;   PARS PLANA VITRECTOMY Right 03/21/2021   Procedure: PARS PLANA VITRECTOMY 25 GAUGE WITH INJECTION OF ANTIBIOTICS FOR ENDOPHTHALMITIS;  Surgeon: Carmela Rima, MD;  Location: [facility] OR;  Service: Ophthalmology;  Laterality: Right;   POLYPECTOMY N/A 09/07/2021   Procedure: POLYPECTOMY;  Surgeon: Midge Minium, MD;  Location: [facility];  Service: Endoscopy;  Laterality: N/A;   REPAIR EXTENSOR TENDON Right 08/11/2022   Procedure: EXTENSOR CARPI ULNARIS TENDON DEBRIDEMENT, POSSIBLE SUBSHEATH RECONSTRUCTION;  Surgeon: Marlyne Beards, MD;  Location: [facility];  Service: Orthopedics;  Laterality: Right;  or regional plus MAC    Family History  Problem Relation Age of Onset   Aneurysm Mother        brain   Hypertension Mother    Cancer Father    Heart disease Father    Diabetes Father    Hypertension Father    Hyperlipidemia Father    Cancer  Maternal Grandmother        melanoma   Heart disease Paternal Grandfather    Colon cancer Neg Hx     Social History   Socioeconomic History   Marital status: Married    Spouse name: Statistician   Number of children: 1   Years of education: 12   Highest education level: Not on file  Occupational History   Occupation: Architect: [employer]  Tobacco Use   Smoking status: Never   Smokeless tobacco: Current    Types: Snuff  Vaping Use   Vaping status: Never Used  Substance and Sexual Activity   Alcohol use: Not Currently    Comment: seldom   Drug use: Never   Sexual activity: Yes    Birth control/protection: Surgical  Other Topics Concern   Not on file  Social History Narrative   Lives with wife   [place]   Caffeine- diet Mtn Dew, 2 glasses   Works as Scientist, water quality for [employer]   Social Drivers of Health   Financial Resource Strain: Low Risk  (02/08/2023)   Received from [employer], [facility]   Overall Financial Resource Strain (CARDIA)    Difficulty of Paying Living Expenses: Not hard at all  Food Insecurity: No Food Insecurity (11/06/2023)   Hunger Vital Sign    Worried About Running Out of Food in the Last Year: Never true    Ran Out of Food in the Last Year: Never true  Transportation Needs: No Transportation Needs (11/06/2023)   PRAPARE - Administrator, Civil Service (Medical): No    Lack of Transportation (Non-Medical): No  Physical Activity: Patient Declined (02/08/2023)   Received from [facility], [facility]   Exercise Vital Sign    Days of Exercise per Week: Patient declined    Minutes of Exercise per Session: Patient declined  Stress: No Stress  Concern Present (02/08/2023)   Received from [facility], [facility] of Occupational Health - Occupational Stress Questionnaire    Feeling of Stress : Not at all  Social Connections: Socially Integrated  (02/08/2023)   Received from [facility], [facility]   Social Network    How would you rate your social network (family, work, friends)?: Good participation with social networks  Intimate Partner Violence: Not At Risk (11/06/2023)   Humiliation, Afraid, Rape, and Kick questionnaire    Fear of Current or Ex-Partner: No    Emotionally Abused: No    Physically Abused: No    Sexually Abused: No     Review of System:   General: Negative for  fever, chills, see hpi Eyes: Negative for vision changes.  ENT: Negative for hoarseness, difficulty swallowing , nasal congestion. CV: Negative for chest pain, angina, palpitations, dyspnea on exertion, peripheral edema.  Respiratory: Negative for dyspnea at rest, dyspnea on exertion, cough, sputum, wheezing.  GI: See history of present illness. GU:  suprapubic catheter  MS: Negative for joint pain, low back pain.  Derm: Negative for rash or itching.  Neuro: Negative for weakness, abnormal sensation, seizure, frequent headaches, memory loss. See hpi Psych: Negative for anxiety, depression, suicidal ideation, hallucinations.  Endo: Negative for unusual weight change.  Heme: Negative for bruising or bleeding. Allergy: Negative for rash or hives.      Physical Examination:   Vital signs in last 24 hours: Temp:  [98.1 F (36.7 C)-98.8 F (37.1 C)] 98.8 F (37.1 C) (12/23 0431) Pulse Rate:  [85-96] 85 (12/23 0431) Resp:  [18-28] 19 (12/22 1830) BP: (86-115)/(52-81) 115/59 (12/23 0431) SpO2:  [88 %-100 %] 93 % (12/23 0431) Last BM Date : 11/03/23  General:appears older than stated age. No acute distress. Multiple tattoos.  Head: Normocephalic, atraumatic.   Eyes: Conjunctiva pale, no icterus. Mouth: Oropharyngeal mucosa dry Neck: radiation changes noted on right anterior neck Lungs: Clear to auscultation bilaterally.  Heart: Regular rate and rhythm, no murmurs rubs or gallops.  Abdomen: Bowel sounds are normal, nontender, nondistended,  no hepatosplenomegaly or masses, no abdominal bruits or hernia , no rebound or guarding.   Rectal: not performed Extremities: No lower extremity edema, clubbing, deformity.  Neuro: Alert and oriented to person, place.   Skin: Warm and dry, no rash or jaundice.   Psych: Alert and cooperative, normal mood and affect.        Intake/Output from previous day: 12/22 0701 - 12/23 0700 In: 527.5 [I.V.:527.5] Out: 1350 [Urine:1350] Intake/Output this shift: No intake/output data recorded.  Lab Results:   CBC Recent Labs    11/06/23 1322 11/07/23 0427  WBC 5.0 5.7  HGB 9.0* 8.0*  HCT 28.1* 25.1*  MCV 93.0 94.0  PLT 292 199   BMET Recent Labs    11/06/23 1322 11/07/23 0427  NA 129* 135  K 5.6* 4.7  CL 99 107  CO2 17* 20*  GLUCOSE 123* 51*  BUN 85* 74*  CREATININE 5.38* 4.41*  CALCIUM 8.4* 8.0*   LFT Recent Labs    11/06/23 1322 11/07/23 0427  BILITOT 0.4 0.5  ALKPHOS 73 63  AST 198* 114*  ALT 183* 139*  PROT 5.7* 4.9*  ALBUMIN 2.4* 1.9*    Lipase No results for input(s): "LIPASE" in the last 72 hours.  PT/INR Recent Labs    11/06/23 1322  LABPROT 16.4*  INR 1.3*     Hepatitis Panel No results for input(s): "HEPBSAG", "HCVAB", "HEPAIGM", "HEPBIGM"  in the last 72 hours.   Imaging Studies:   CT HEAD WO CONTRAST ( ) Result Date: 11/06/2023 CLINICAL DATA:  Patient fell 2 days ago. History of head neck cancer. Encephalopathy. EXAM: CT HEAD WITHOUT CONTRAST TECHNIQUE: Contiguous axial images were obtained from the base of the skull through the vertex without intravenous contrast. RADIATION DOSE REDUCTION: This exam was performed according to the departmental dose-optimization program which includes automated exposure control, adjustment of the mA and/or kV according to patient size and/or use of iterative reconstruction technique. COMPARISON:  05/12/2021 FINDINGS: Brain: There is no evidence for acute hemorrhage, hydrocephalus, mass lesion, or abnormal  extra-axial fluid collection. No definite CT evidence for acute infarction. Vascular: No hyperdense vessel or unexpected calcification. Skull: No evidence for fracture. No worrisome lytic or sclerotic lesion. Sinuses/Orbits: The visualized paranasal sinuses and mastoid air cells are clear. Visualized portions of the globes and intraorbital fat are unremarkable. Other: None. IMPRESSION: No acute intracranial abnormality. Electronically Signed   By: Kennith Center M.D.   On: 11/06/2023 16:29   DG Pelvis Portable Result Date: 11/06/2023 CLINICAL DATA:  Fall. EXAM: PORTABLE PELVIS 1-2 VIEWS COMPARISON:  Same day CT chest, abdomen, and pelvis. FINDINGS: There is no evidence of pelvic fracture or diastasis. The sacroiliac joints and pubic symphysis appear anatomically aligned. No pelvic bone lesions are seen. IMPRESSION: No acute osseous abnormality identified on [facility] pelvic radiograph. Electronically Signed   By: Hart Robinsons M.D.   On: 11/06/2023 14:09   DG Chest Port 1 View Result Date: 11/06/2023 CLINICAL DATA:  Fall. EXAM: PORTABLE CHEST 1 VIEW COMPARISON:  Same day CT chest, abdomen, and pelvis. Chest radiograph dated February 03, 2023. FINDINGS: Low lung volumes with accentuation of the cardiomediastinal silhouette. Overlapping telemetry wires. Patchy opacities at the left lung base. Blunting of the left costophrenic angle. The right lung appears clear. No pneumothorax. Posterior left tenth and eleventh rib fractures are not well visualized on this exam and better evaluated on the same-day CT chest, abdomen, and pelvis. IMPRESSION: 1. Posterior left tenth and eleventh rib fractures are not well visualized on this exam and better evaluated on the same-day CT chest, abdomen, and pelvis. No pneumothorax. 2. Patchy opacities at the left lung base could reflect contusion, infiltrate, or atelectasis. 3. Probable trace left effusion. Electronically Signed   By: Hart Robinsons M.D.   On: 11/06/2023 14:06   CT  CHEST ABDOMEN PELVIS WO CONTRAST Result Date: 11/06/2023 CLINICAL DATA:  Fall 2 days ago. Blunt trauma. New onset hypoxia. Rib pain. EXAM: CT CHEST, ABDOMEN AND PELVIS WITHOUT CONTRAST TECHNIQUE: Multidetector CT imaging of the chest, abdomen and pelvis was performed following the standard protocol without IV contrast. RADIATION DOSE REDUCTION: This exam was performed according to the departmental dose-optimization program which includes automated exposure control, adjustment of the mA and/or kV according to patient size and/or use of iterative reconstruction technique. COMPARISON:  CT AP from 10/15/2022 FINDINGS: CT CHEST FINDINGS Cardiovascular: Heart size appears mildly enlarged. There is no pericardial effusion. Aortic atherosclerotic calcifications and coronary artery calcifications. Mediastinum/Nodes: No enlarged mediastinal, hilar, or axillary lymph nodes. Thyroid gland, trachea, and esophagus demonstrate no significant findings. Lungs/Pleura: Small volume of pleural fluid is identified within the left base the fluid measures water density. Lingular atelectasis. In the left lower lobe there are multifocal areas of subsegmental atelectasis, ground-glass attenuation, and posterior consolidative change. No pneumothorax identified. Musculoskeletal: There are acute fractures involving the posterior aspect of the left tenth and eleventh ribs posteriorly, image 53/2 and  image 58/2. The thoracic vertebral body heights are well preserved without signs of acute fracture. Intact sternum. CT ABDOMEN PELVIS FINDINGS Hepatobiliary: No focal liver abnormality is seen. Status post cholecystectomy. No biliary dilatation. Pancreas: Unremarkable. No pancreatic ductal dilatation or surrounding inflammatory changes. Spleen: No splenic injury or perisplenic hematoma. Adrenals/Urinary Tract: No adrenal hemorrhage or renal injury identified. The bladder is partially decompressed around a suprapubic catheter. Stomach/Bowel: Stomach  is unremarkable. No pathologic dilatation of the large or small bowel loops. The appendix is visualized and is normal. There is a moderate to large stool burden identified within the ascending colon. No bowel wall thickening or inflammatory changes identified Vascular/Lymphatic: Aortic atherosclerosis. No enlarged abdominal or pelvic lymph nodes. Reproductive: Prostate is unremarkable. Other: No free fluid or fluid collections. Small fat containing umbilical hernia. Musculoskeletal: No fracture is seen. IMPRESSION: 1. Acute fractures involving the posterior aspect of the left tenth and eleventh ribs posteriorly. 2. Small volume of pleural fluid is identified within the left base. The fluid measures water density. No pneumothorax identified. 3. Multifocal areas of subsegmental atelectasis, ground-glass attenuation, and posterior consolidative change within the left lower lobe. Findings may reflect pulmonary contusion and/or atelectasis. 4. No acute findings within the abdomen or pelvis. 5. Moderate to large stool burden identified within the ascending colon. Correlate for any clinical signs or symptoms of constipation. 6. Coronary artery calcifications. 7.  Aortic Atherosclerosis (ICD10-I70.0). Electronically Signed   By: Signa Kell M.D.   On: 11/06/2023 13:56  [4 week]  Assessment:   62 year old male with history of GERD, constipation, celiac disease, gastroparesis, chronic kidney disease, diabetes, oropharynx cancer recently completing radiation therapy and chemotherapy (carboplatin and paclitaxel) last week, anemia (receives EPO and IV iron), suprapubic catheter presenting with confusion and weakness and concern for left axillary pain following falls. Patient noted to have 2 rib factures, AKI, UTI, and pulmonary contusion versus consolidation. Decision made to admit for further monitoring, management. GI consulted this morning for elevated transaminases.   Elevated AST/ALT: Intermittent elevation of  AST/ALT noted previously.  Numbers were normal however on 10/27/2023.  Etiology likely multifactorial, including medication related (carboplatin and paclitaxel), in the setting of infection, hypotension, poorly controlled celiac disease could be playing a role as well. He has history of positive hepatitis C antibody in the past with negative HCVRNA.  Recommend updating HCVRNA to make sure no evidence of viremia especially in the setting of recent chemotherapy and immunocompromised state.  Current noncontrast CT with no notable abnormalities regarding the liver or biliary tree.  At this point would recommend trending LFTs.  If remain elevated, could consider right upper quadrant ultrasound to look at the biliary tree more closely although retained gallstone unlikely given normal common bile duct diameter.  Constipation: Patient reports taking Linzess and MiraLAX at home and thought he was managing his symptoms although his current CT shows large stool burden.  With recent confusion, it is not really clear how well he has been evacuating his bowels.  I do note documentation in epic through care everywhere that he has had some recent fecal impaction and had his opioids downgraded to tramadol recently.  Anemia: chronic likely multifactorial with recent chemo, CKD. Has been receiving EPO and scheduled for IV iron per wife. No overt GI bleeding. EGD/colonoscopy in 2022. Poorly controlled celiac likely playing a role. Recommend follow up with primary GI as outpatient.   Plan:   Trend LFTs. If increase, consider further work up. Otherwise, would recommend outpatient follow up with his  primary GI at [facility].  HCV RNA.  Recommend gluten free diet.  Optimize bowel regimen. At home he is on Linzess 290mg  daily, Miralax one capful daily. Significant stool burden on imaging. Increase miralax to BID for now. Will give tap water enema today. If still not effective, he may require Movantik as long as he continues  opioids.  Minimize opioid use.      LOS: 1 day   We would like to thank you for the opportunity to participate in the care of MICHAELANGELO ECCHER.  Leanna Battles. Dixon Boos [facility] [phone] 12/23/20248:24 AM

## 2023-11-08 DIAGNOSIS — G9341 Metabolic encephalopathy: Secondary | ICD-10-CM | POA: Diagnosis not present

## 2023-11-08 DIAGNOSIS — S2242XD Multiple fractures of ribs, left side, subsequent encounter for fracture with routine healing: Secondary | ICD-10-CM

## 2023-11-08 DIAGNOSIS — E871 Hypo-osmolality and hyponatremia: Secondary | ICD-10-CM | POA: Diagnosis not present

## 2023-11-08 DIAGNOSIS — N179 Acute kidney failure, unspecified: Secondary | ICD-10-CM | POA: Diagnosis not present

## 2023-11-08 DIAGNOSIS — N400 Enlarged prostate without lower urinary tract symptoms: Secondary | ICD-10-CM | POA: Diagnosis not present

## 2023-11-08 LAB — RENAL FUNCTION PANEL
Albumin: 1.9 g/dL — ABNORMAL LOW (ref 3.5–5.0)
Anion gap: 11 (ref 5–15)
BUN: 64 mg/dL — ABNORMAL HIGH (ref 8–23)
CO2: 18 mmol/L — ABNORMAL LOW (ref 22–32)
Calcium: 8.3 mg/dL — ABNORMAL LOW (ref 8.9–10.3)
Chloride: 109 mmol/L (ref 98–111)
Creatinine, Ser: 3.36 mg/dL — ABNORMAL HIGH (ref 0.61–1.24)
GFR, Estimated: 20 mL/min — ABNORMAL LOW (ref >60)
Glucose, Bld: 87 mg/dL (ref 70–99)
Phosphorus: 3.6 mg/dL (ref 2.5–4.6)
Potassium: 3.7 mmol/L (ref 3.5–5.1)
Sodium: 138 mmol/L (ref 135–145)

## 2023-11-08 LAB — HEPATIC FUNCTION PANEL
ALT: 116 U/L — ABNORMAL HIGH (ref 0–44)
AST: 65 U/L — ABNORMAL HIGH (ref 15–41)
Albumin: 1.9 g/dL — ABNORMAL LOW (ref 3.5–5.0)
Alkaline Phosphatase: 77 U/L (ref 38–126)
Bilirubin, Direct: 0.1 mg/dL (ref 0.0–0.2)
Indirect Bilirubin: 0.2 mg/dL — ABNORMAL LOW (ref 0.3–0.9)
Total Bilirubin: 0.3 mg/dL (ref <1.2)
Total Protein: 5.2 g/dL — ABNORMAL LOW (ref 6.5–8.1)

## 2023-11-08 LAB — HCV RNA QUANT: HCV Quantitative: NOT DETECTED [IU]/mL (ref >50)

## 2023-11-08 LAB — GLUCOSE, CAPILLARY
Glucose-Capillary: 192 mg/dL — ABNORMAL HIGH (ref 70–99)
Glucose-Capillary: 88 mg/dL (ref 70–99)
Glucose-Capillary: 89 mg/dL (ref 70–99)
Glucose-Capillary: 129 mg/dL — ABNORMAL HIGH (ref 70–99)
Glucose-Capillary: 162 mg/dL — ABNORMAL HIGH (ref 70–99)

## 2023-11-08 MED ORDER — POLYETHYLENE GLYCOL 3350 17 G PO PACK: 17.0000 g | PACK | Freq: Every day | ORAL | Status: DC | PRN

## 2023-11-08 MED ORDER — DULOXETINE HCL 20 MG PO CPEP
20.0000 mg | ORAL_CAPSULE | Freq: Every day | ORAL | Status: DC
  Administered 2023-11-08 – 2023-11-14 (×7): 20 mg via ORAL
  Filled 2023-11-08 (×7): qty 1

## 2023-11-08 MED ORDER — HYDRALAZINE HCL 20 MG/ML IJ SOLN
10.0000 mg | INTRAMUSCULAR | Status: DC | PRN
  Administered 2023-11-08 – 2023-11-11 (×7): 10 mg via INTRAVENOUS
  Filled 2023-11-08 (×7): qty 1

## 2023-11-08 MED ORDER — MAGIC MOUTHWASH W/LIDOCAINE
10.0000 mL | Freq: Three times a day (TID) | ORAL | Status: DC | PRN
  Administered 2023-11-10 – 2023-11-13 (×3): 10 mL via ORAL
  Filled 2023-11-08 (×6): qty 10

## 2023-11-08 MED ORDER — ASPIRIN 81 MG PO TBEC
81.0000 mg | DELAYED_RELEASE_TABLET | Freq: Every day | ORAL | Status: DC
  Administered 2023-11-08 – 2023-11-14 (×7): 81 mg via ORAL
  Filled 2023-11-08 (×7): qty 1

## 2023-11-08 MED ORDER — SODIUM BICARBONATE 650 MG PO TABS
650.0000 mg | ORAL_TABLET | Freq: Two times a day (BID) | ORAL | Status: DC
  Administered 2023-11-08 – 2023-11-11 (×7): 650 mg via ORAL
  Filled 2023-11-08 (×7): qty 1

## 2023-11-08 MED ORDER — CLONIDINE HCL 0.1 MG PO TABS
0.1000 mg | ORAL_TABLET | Freq: Two times a day (BID) | ORAL | Status: DC
  Administered 2023-11-08 (×2): 0.1 mg via ORAL
  Filled 2023-11-08 (×2): qty 1

## 2023-11-08 MED ORDER — SENNOSIDES-DOCUSATE SODIUM 8.6-50 MG PO TABS
1.0000 | ORAL_TABLET | Freq: Every day | ORAL | Status: DC
  Administered 2023-11-09 – 2023-11-13 (×5): 1 via ORAL
  Filled 2023-11-08 (×5): qty 1

## 2023-11-08 MED ORDER — SODIUM BICARBONATE 650 MG PO TABS: 650.0000 mg | ORAL_TABLET | Freq: Two times a day (BID) | ORAL | 1 refills | Status: DC

## 2023-11-08 MED ORDER — INSULIN GLARGINE-YFGN 100 UNIT/ML ~~LOC~~ SOLN
10.0000 [IU] | Freq: Every day | SUBCUTANEOUS | Status: DC
  Administered 2023-11-09 – 2023-11-12 (×4): 10 [IU] via SUBCUTANEOUS
  Filled 2023-11-08 (×5): qty 0.1

## 2023-11-08 NOTE — Plan of Care (Signed)
  Problem: Education: Goal: Knowledge of General Education information will improve Description: Including pain rating scale, medication(s)/side effects and non-pharmacologic comfort measures Outcome: Not Progressing   Problem: Health Behavior/Discharge Planning: Goal: Ability to manage health-related needs will improve Outcome: Not Progressing   Problem: Activity: Goal: Risk for activity intolerance will decrease Outcome: Not Progressing   Problem: Pain Management: Goal: General experience of comfort will improve Outcome: Not Progressing

## 2023-11-08 NOTE — Hospital Course (Signed)
62 y.o. male with medical history significant for HTN, DM, CKD3, head and neck cancer, anemia, suprapubic catheter.  Patient presented to the ED with complaints of a fall and confusion. On my evaluation patient is awake alert but fidgety, able to answer simple questions, he remembers falling but not give a lot of history.  Spouse and son who are at bedside.  Confusion started about a week ago, with patient saying things that did not make sense, they thought this was related to patient's pain medication - they are not sure exactly how much he was taking but they believe he was taking more than prescribed.  He fell about a week ago, and then fell again yesterday.  They report yesterday he was standing and weaving about the bed and fell.  Patient denies dizziness, denies chest pain no difficulty breathing, but he is having pain to his left side of his chest.  They report a chronic productive cough that is unchanged related to his throat cancer and treatment. Spouse reports some initial difficulty breathing today on EMS arrival, but this had resolved on arrival to the ED. He is on ulcers in his mouth, but the report reports stable oral intake.  No vomiting.  He is constipated.  No fevers.   ED Course: Blood pressure systolic down to 91/47 improved with fluid bolus currently low 90s.  O2 sats-documented at 88% on room air- per EDP- was an error. Patient was 98% on room on my air evaluation.  Creatinine elevated at 5.38.  Sodium 129.  Mildly elevated liver enzymes AST 198, ALT 183.  Lactic acid 2.1.  Pelvic X-ray negative for acute abnormality.  CT chest abdomen and pelvis-acute fractures-posterior left 10th and 11th ribs, small volume pleural fluid within left base-fluid measures water density.  No pneumothorax.  Multifocal subsegmental atelectasis, groundglass attenuation posterior consolidative change left lung-large contusion or atelectasis.  IV ceftriaxone and azithromycin started.  1.5 L bolus given.

## 2023-11-08 NOTE — TOC Progression Note (Signed)
Transition of Care [facility]) - Progression Note    Patient Details  Name: Brian Nielsen MRN: [id number] Date of Birth: 1961-06-19  Transition of Care [facility] CM/SW Contact  Elliot Gault, LCSW Phone Number: 11/08/2023, 12:34 PM  Clinical Narrative:     TOC following. PT recommending CIR now. Asked MD for OT eval as CIR will need eval from both disciplines before they will assess.    Will follow.  Expected Discharge Plan: Home/Self Care Barriers to Discharge: Continued Medical Work up  Expected Discharge Plan and Services In-house Referral: Clinical Social Work     Living arrangements for the past 2 months: Single Family Home Expected Discharge Date: 11/08/23                                     Social Determinants of Health (SDOH) Interventions SDOH Screenings   Food Insecurity: No Food Insecurity (11/06/2023)  Housing: Unknown (11/06/2023)  Transportation Needs: No Transportation Needs (11/06/2023)  Utilities: Not At Risk (11/06/2023)  Financial Resource Strain: Low Risk  (02/08/2023)   Received from [facility], [facility]  Physical Activity: Patient Declined (02/08/2023)   Received from [facility], [facility]  Social Connections: Socially Integrated (02/08/2023)   Received from [facility], [facility]  Stress: No Stress Concern Present (02/08/2023)   Received from [facility], [facility]  Tobacco Use: High Risk (11/06/2023)    Readmission Risk Interventions    11/07/2023   11:04 AM  Readmission Risk Prevention Plan  Transportation Screening Complete  PCP or Specialist Appt within 3-5 Days Complete  HRI or Home Care Consult Complete  Social Work Consult for Recovery Care Planning/Counseling Complete  Palliative Care Screening Not Applicable  Medication Review [employer]) Complete

## 2023-11-08 NOTE — Progress Notes (Signed)
Physical Therapy Treatment Patient Details Name: Brian Nielsen MRN: [id number] DOB: [birth year] Today's Date: 11/08/2023   History of Present Illness Brian Nielsen is a 62 y.o. male with medical history significant for HTN, DM, CKD3, head and neck cancer, anemia, suprapubic catheter.  Patient presented to the ED with complaints of a fall and confusion.  On my evaluation patient is awake alert but fidgety, able to answer simple questions, he remembers falling but not give a lot of history.  Spouse and son who are at bedside.  Confusion started about a week ago, with patient saying things that did not make sense, they thought this was related to patient's pain medication - they are not sure exactly how much he was taking but they believe he was taking more than prescribed.  He fell about a week ago, and then fell again yesterday.  They report yesterday he was standing and weaving about the bed and fell.  Patient denies dizziness, denies chest pain no difficulty breathing, but he is having pain to his left side of his chest.  They report a chronic productive cough that is unchanged related to his throat cancer and treatment. Spouse reports some initial difficulty breathing today on EMS arrival, but this had resolved on arrival to the ED.  He is on ulcers in his mouth, but the report reports stable oral intake.  No vomiting.  He is constipated.  No fevers.    PT Comments  Yesterday evening pt was seen standing at doorway waiting for his ride, pt was unsteady and needed two people for safety to return to bed.  PT ambulated pt, pt is unsteady on feet and has uneven strides when ambulating. Due to Left rib fractures from his fall pt is having difficulty with bed mobility.  At this time therapist feels pt will benefit from CIR to improve his strength and balance to decrease his risk of falling again.  PT lives with wife who is supportive but unable to be with pt 24/7 at this time.     If plan is discharge home,  recommend the following: A lot of help with walking and/or transfers;A lot of help with bathing/dressing/bathroom;Assistance with cooking/housework;Assist for transportation;Help with stairs or ramp for entrance   Can travel by private vehicle      Yes   Equipment Recommendations  Rolling walker (2 wheels)    Recommendations for Other Services  CIR     Precautions / Restrictions Precautions Precautions: Fall Restrictions Weight Bearing Restrictions Per Provider Order: No     Mobility  Bed Mobility Overal bed mobility: Needs Assistance Bed Mobility: Supine to Sit, Sit to Supine     Supine to sit: Mod assist Sit to supine: Mod assist   General bed mobility comments: increased time, labored movement, increased left sided rib cage pain    Transfers Overall transfer level: Needs assistance Equipment used: Rolling walker (2 wheels) Transfers: Sit to/from Stand, Bed to chair/wheelchair/BSC Sit to Stand: Min assist   Step pivot transfers: Contact guard assist       General transfer comment: labored movement, increased time    Ambulation/Gait Ambulation/Gait assistance: Contact guard assist Gait Distance (Feet): 60 Feet (gait is unsteady) Assistive device: Rolling walker (2 wheels)   Gait velocity: decreased     General Gait Details: no loss of balance but significant unsteadiness         Balance   Sitting-balance support: Feet supported, No upper extremity supported Sitting balance-Leahy Scale: Fair Sitting balance -  Comments: seated at EOB   Standing balance support: Bilateral upper extremity supported Standing balance-Leahy Scale: Fair                              Cognition Arousal: Alert Behavior During Therapy: Impulsive Overall Cognitive Status: Within Functional Limits for tasks assessed                                                 Pertinent Vitals/Pain Pain Assessment Pain Score: 5  Pain Location: left side  of rib cage Pain Descriptors / Indicators: Discomfort, Grimacing, Guarding, Sore Pain Intervention(s): Limited activity within patient's tolerance       Prior Function   MOd I          PT Goals (current goals can now be found in the care plan section) Acute Rehab PT Goals Patient Stated Goal: return home with family to assist PT Goal Formulation: With patient Time For Goal Achievement: 11/18/23 Potential to Achieve Goals: Good Progress towards PT goals: Progressing toward goals    Frequency    Min 4X/week              AM-PAC PT "6 Clicks" Mobility   Outcome Measure  Help needed turning from your back to your side while in a flat bed without using bedrails?: A Lot Help needed moving from lying on your back to sitting on the side of a flat bed without using bedrails?: A Lot Help needed moving to and from a bed to a chair (including a wheelchair)?: A Little Help needed standing up from a chair using your arms (e.g., wheelchair or bedside chair)?: A Little Help needed to walk in hospital room?: A Little Help needed climbing 3-5 steps with a railing? : A Lot 6 Click Score: 15    End of Session Equipment Utilized During Treatment: Gait belt Activity Tolerance: Patient limited by pain Patient left: in bed;with call bell/phone within reach Nurse Communication: Mobility status PT Visit Diagnosis: Unsteadiness on feet (R26.81);Other abnormalities of gait and mobility (R26.89);Muscle weakness (generalized) (M62.81)     Time: 9604-5409 PT Time Calculation (min) (ACUTE ONLY): 17 min  Charges:    $Gait Training: 8-22 mins PT General Charges $$ ACUTE PT VISIT: 1 Visit                      Virgina Organ, PT CLT [phone]  11/08/2023, 11:41 AM

## 2023-11-08 NOTE — Progress Notes (Signed)
Inpatient Rehab Admissions Coordinator:   Per PT updated recommendations pt was screened for CIR by Estill Dooms, PT, DPT.  Chart reviewed.  Pt admitted for falls and encephalopathy.  Rib fxs noted.  Extensive PMH.  Pt min assist with PT eval yesterday, progressing to CGA today.  Do not feel he will require a CIR stay at this time.  We would not be able to admit him without insurance prior auth, which we would not be able to start until Thursday at the earliest, at which point I expect he will have progressed enough to discharge directly home.  I will sign off at this time.   Estill Dooms, PT, DPT Admissions Coordinator [phone] 11/08/23  2:33 PM

## 2023-11-08 NOTE — Progress Notes (Addendum)
PROGRESS NOTE   Brian Nielsen  ZOX:[id number] DOB: [birth year] DOA: 11/06/2023 PCP: Chyrl Civatte, FNP   Chief Complaint  Patient presents with   Fall   Rib Injury   Level of care: Telemetry  Brief Admission History:  62 y.o. male with medical history significant for HTN, DM, CKD3, head and neck cancer, anemia, suprapubic catheter.  Patient presented to the ED with complaints of a fall and confusion. On my evaluation patient is awake alert but fidgety, able to answer simple questions, he remembers falling but not give a lot of history.  Spouse and son who are at bedside.  Confusion started about a week ago, with patient saying things that did not make sense, they thought this was related to patient's pain medication - they are not sure exactly how much he was taking but they believe he was taking more than prescribed.  He fell about a week ago, and then fell again yesterday.  They report yesterday he was standing and weaving about the bed and fell.  Patient denies dizziness, denies chest pain no difficulty breathing, but he is having pain to his left side of his chest.  They report a chronic productive cough that is unchanged related to his throat cancer and treatment. Spouse reports some initial difficulty breathing today on EMS arrival, but this had resolved on arrival to the ED. He is on ulcers in his mouth, but the report reports stable oral intake.  No vomiting.  He is constipated.  No fevers.   ED Course: Blood pressure systolic down to 81/19 improved with fluid bolus currently low 90s.  O2 sats-documented at 88% on room air- per EDP- was an error. Patient was 98% on room on my air evaluation.  Creatinine elevated at 5.38.  Sodium 129.  Mildly elevated liver enzymes AST 198, ALT 183.  Lactic acid 2.1.  Pelvic X-ray negative for acute abnormality.  CT chest abdomen and pelvis-acute fractures-posterior left 10th and 11th ribs, small volume pleural fluid within left base-fluid measures water  density.  No pneumothorax.  Multifocal subsegmental atelectasis, groundglass attenuation posterior consolidative change left lung-large contusion or atelectasis.  IV ceftriaxone and azithromycin started.  1.5 L bolus given.  Assessment and Plan:  Acute metabolic encephalopathy Likely 2/2 opiates, AKI, hypotension.  Presenting with falls, confusion.  CT shows pulmonary contusion.  Chronic indwelling suprapubic catheter with UA showing large leukocytes and many bacteria.   -Obtain head CT - negative for acute findings  -Obtain urine culture, no recent urine cultures -Hydrated with IV fluids -Limit opiates -Hold psychoactive meds for now - amitriptyline, Cymbalta, on med list spouse unsure if patient is taking, also topiramate new medication. -Continue IV ceftriaxone and azithromycin  -Check procalcitonin>>7.44  -suprapubic catheter obstructed may have been contributing to confusion, relieved now and flowing freely   Acute fractures left ribs 10/11 - supportive measures ordered - pain management as needed  - incentive spirometry ordered   Hyponatremia - resolved  Mild hyponatremia sodium 129.  Baseline 1 37-1 39. -Hydrated with normal saline  Transaminitis AST 198, ALT 183.  Normal T. bili 0.4, ALP 73.  No abdominal pain.  CT abdomen and pelvis without focal liver abnormality, status postcholecystectomy no biliary dilatation.  -Trend for now likely from hypotension, LFTs trending down  -Hold Lipitor -Per Care Everywhere recent hepatitis B- 09/2023 nonreactive. - Check Hep C per GI - outpatient GI follow up with Dr. Servando Snare [facility]   Fall at home, initial encounter Likely secondary to  encephalopathy, opiates, hypotension/dehydration.  CT chest shows left 10th and 11th rib fractures, suggest pulmonary contusion/atelectasis.  He reports a chronic cough and some dyspnea prior to arrival in the ED. Not hypoxic with O2 sats 98% on room air currently.  Afebrile.  WBC 5.  Lactic acid 2.1-likely   2/2 hypotension. - Most likely pulmonary contusion, IV ceftriaxone and azithromycin started in ED, continue for now with low threshold to discontinue -PT eval recommending CIR - placed order for CIR evaluation  -fall precautions   Acute kidney injury superimposed on CKD stage 3b  Cr-5.38, baseline CKD stage IIIb/IV , with creatinine 1.8-2.  Prerenal, initial hypotension in ED has improved with fluids.  On lisinopril, Lasix on med list but he is not taking this. -possibly secondary to suprapubic catheter obstruction which is now relieved and urine flowing freely  -Hold lisinopril 20 mg daily temporarily   Oropharynx cancer  Completed chemoradiation therapy.  Follows with [facility] oncology.  OSA (obstructive sleep apnea) Resume CPAP nightly  Diabetes mellitus with stage 3 chronic kidney disease  Controlled.  A1c 6.2. - SSi- S -Resume home Lantus at reduced dose 10 units twice daily -Hold Dewayne Shorter CBG (last 3)  Recent Labs    11/07/23 2108 11/08/23 0534 11/08/23 0736  GLUCAP 192* 88 89   Essential hypertension, benign Presenting with hypotension. -Hold lisinopril -Resume clonidine 0.2 twice daily when able, to avoid rebound hypertension  BPH (benign prostatic hyperplasia) Chronic suprapubic catheter.  Last changed per spouse about 2 weeks ago.  Follows with urology.  DVT prophylaxis: sq heparin  Code Status: Full  Family Communication: wife updated bedside 12/24 Disposition: CIR recommended by PT    Consultants:   Procedures:   Antimicrobials:  CTX 12/22>> Azithromycin 12/22>>   Subjective: Pt has been intermittently confused; his suprapubic catheter was obstructed this morning.   Objective: Vitals:   11/07/23 1923 11/07/23 2227 11/08/23 0418 11/08/23 0800  BP: (!) 159/67  (!) 162/82   Pulse: 92 91 81   Resp:  17  15  Temp: 98.5 F (36.9 C)  98.6 F (37 C)   TempSrc: Oral  Axillary   SpO2: 95% 94% 96%     Intake/Output Summary (Last 24 hours) at  11/08/2023 1236 Last data filed at 11/08/2023 1056 Gross per 24 hour  Intake 290 ml  Output 2800 ml  Net -2510 ml   There were no vitals filed for this visit. Examination:  General exam: Appears calm and comfortable  Respiratory system: Clear to auscultation. Respiratory effort normal. Cardiovascular system: normal S1 & S2 heard. No JVD, murmurs, rubs, gallops or clicks. No pedal edema. Gastrointestinal system: Abdomen is nondistended, soft and nontender. No organomegaly or masses felt. Normal bowel sounds heard. GU: suprapubic catheter blocked;  Central nervous system: Alert and oriented. No focal neurological deficits. Extremities: Symmetric 5 x 5 power. Skin: No rashes, lesions or ulcers. Psychiatry: intermittently confused; Judgement and insight appear normal. Mood & affect appropriate.   Data Reviewed: I have personally reviewed following labs and imaging studies  CBC: Recent Labs  Lab 11/06/23 1322 11/07/23 0427  WBC 5.0 5.7  HGB 9.0* 8.0*  HCT 28.1* 25.1*  MCV 93.0 94.0  PLT 292 199    Basic Metabolic Panel: Recent Labs  Lab 11/06/23 1322 11/07/23 0427 11/08/23 0355  NA 129* 135 138  K 5.6* 4.7 3.7  CL 99 107 109  CO2 17* 20* 18*  GLUCOSE 123* 51* 87  BUN 85* 74* 64*  CREATININE 5.38* 4.41*  3.36*  CALCIUM 8.4* 8.0* 8.3*  PHOS  --   --  3.6    CBG: Recent Labs  Lab 11/07/23 1212 11/07/23 1620 11/07/23 2108 11/08/23 0534 11/08/23 0736  GLUCAP 237* 152* 192* 88 89    Recent Results (from the past 240 hours)  Urine Culture (for pregnant, neutropenic or urologic patients or patients with an indwelling urinary catheter)     Status: Abnormal (Preliminary result)   Collection Time: 11/06/23  1:22 PM   Specimen: Urine, Clean Catch  Result Value Ref Range Status   Specimen Description   Final    URINE, CLEAN CATCH Performed at [facility], [address], [place], [place] [address]    Special Requests   Final    NONE Performed at [facility], [address] [place], [place] [address]    Culture (A)  Final    >=100,000 COLONIES/mL KLEBSIELLA PNEUMONIAE SUSCEPTIBILITIES TO FOLLOW Performed at [facility], [address] [place], [place] [address]    Report Status PENDING  Incomplete     Radiology Studies: CT HEAD WO CONTRAST ( ) Result Date: 11/06/2023 CLINICAL DATA:  Patient fell 2 days ago. History of head neck cancer. Encephalopathy. EXAM: CT HEAD WITHOUT CONTRAST TECHNIQUE: Contiguous axial images were obtained from the base of the skull through the vertex without intravenous contrast. RADIATION DOSE REDUCTION: This exam was performed according to the departmental dose-optimization program which includes automated exposure control, adjustment of the mA and/or kV according to patient size and/or use of iterative reconstruction technique. COMPARISON:  05/12/2021 FINDINGS: Brain: There is no evidence for acute hemorrhage, hydrocephalus, mass lesion, or abnormal extra-axial fluid collection. No definite CT evidence for acute infarction. Vascular: No hyperdense vessel or unexpected calcification. Skull: No evidence for fracture. No worrisome lytic or sclerotic lesion. Sinuses/Orbits: The visualized paranasal sinuses and mastoid air cells are clear. Visualized portions of the globes and intraorbital fat are unremarkable. Other: None. IMPRESSION: No acute intracranial abnormality. Electronically Signed   By: Kennith Center M.D.   On: 11/06/2023 16:29   DG Pelvis Portable Result Date: 11/06/2023 CLINICAL DATA:  Fall. EXAM: PORTABLE PELVIS 1-2 VIEWS COMPARISON:  Same day CT chest, abdomen, and pelvis. FINDINGS: There is no evidence of pelvic fracture or diastasis. The sacroiliac joints and pubic symphysis appear anatomically aligned. No pelvic bone lesions are seen. IMPRESSION: No acute osseous abnormality identified on AP pelvic radiograph. Electronically Signed   By: Hart Robinsons M.D.   On: 11/06/2023 14:09   DG Chest Port 1  View Result Date: 11/06/2023 CLINICAL DATA:  Fall. EXAM: PORTABLE CHEST 1 VIEW COMPARISON:  Same day CT chest, abdomen, and pelvis. Chest radiograph dated February 03, 2023. FINDINGS: Low lung volumes with accentuation of the cardiomediastinal silhouette. Overlapping telemetry wires. Patchy opacities at the left lung base. Blunting of the left costophrenic angle. The right lung appears clear. No pneumothorax. Posterior left tenth and eleventh rib fractures are not well visualized on this exam and better evaluated on the same-day CT chest, abdomen, and pelvis. IMPRESSION: 1. Posterior left tenth and eleventh rib fractures are not well visualized on this exam and better evaluated on the same-day CT chest, abdomen, and pelvis. No pneumothorax. 2. Patchy opacities at the left lung base could reflect contusion, infiltrate, or atelectasis. 3. Probable trace left effusion. Electronically Signed   By: Hart Robinsons M.D.   On: 11/06/2023 14:06   CT CHEST ABDOMEN PELVIS WO CONTRAST Result Date: 11/06/2023 CLINICAL DATA:  Fall 2 days ago.  Blunt trauma. New onset hypoxia. Rib pain. EXAM: CT CHEST, ABDOMEN AND PELVIS WITHOUT CONTRAST TECHNIQUE: Multidetector CT imaging of the chest, abdomen and pelvis was performed following the standard protocol without IV contrast. RADIATION DOSE REDUCTION: This exam was performed according to the departmental dose-optimization program which includes automated exposure control, adjustment of the mA and/or kV according to patient size and/or use of iterative reconstruction technique. COMPARISON:  CT AP from 10/15/2022 FINDINGS: CT CHEST FINDINGS Cardiovascular: Heart size appears mildly enlarged. There is no pericardial effusion. Aortic atherosclerotic calcifications and coronary artery calcifications. Mediastinum/Nodes: No enlarged mediastinal, hilar, or axillary lymph nodes. Thyroid gland, trachea, and esophagus demonstrate no significant findings. Lungs/Pleura: Small volume of pleural  fluid is identified within the left base the fluid measures water density. Lingular atelectasis. In the left lower lobe there are multifocal areas of subsegmental atelectasis, ground-glass attenuation, and posterior consolidative change. No pneumothorax identified. Musculoskeletal: There are acute fractures involving the posterior aspect of the left tenth and eleventh ribs posteriorly, image 53/2 and image 58/2. The thoracic vertebral body heights are well preserved without signs of acute fracture. Intact sternum. CT ABDOMEN PELVIS FINDINGS Hepatobiliary: No focal liver abnormality is seen. Status post cholecystectomy. No biliary dilatation. Pancreas: Unremarkable. No pancreatic ductal dilatation or surrounding inflammatory changes. Spleen: No splenic injury or perisplenic hematoma. Adrenals/Urinary Tract: No adrenal hemorrhage or renal injury identified. The bladder is partially decompressed around a suprapubic catheter. Stomach/Bowel: Stomach is unremarkable. No pathologic dilatation of the large or small bowel loops. The appendix is visualized and is normal. There is a moderate to large stool burden identified within the ascending colon. No bowel wall thickening or inflammatory changes identified Vascular/Lymphatic: Aortic atherosclerosis. No enlarged abdominal or pelvic lymph nodes. Reproductive: Prostate is unremarkable. Other: No free fluid or fluid collections. Small fat containing umbilical hernia. Musculoskeletal: No fracture is seen. IMPRESSION: 1. Acute fractures involving the posterior aspect of the left tenth and eleventh ribs posteriorly. 2. Small volume of pleural fluid is identified within the left base. The fluid measures water density. No pneumothorax identified. 3. Multifocal areas of subsegmental atelectasis, ground-glass attenuation, and posterior consolidative change within the left lower lobe. Findings may reflect pulmonary contusion and/or atelectasis. 4. No acute findings within the abdomen  or pelvis. 5. Moderate to large stool burden identified within the ascending colon. Correlate for any clinical signs or symptoms of constipation. 6. Coronary artery calcifications. 7.  Aortic Atherosclerosis (ICD10-I70.0). Electronically Signed   By: Signa Kell M.D.   On: 11/06/2023 13:56   Scheduled Meds:  Chlorhexidine Gluconate Cloth  6 each Topical Daily   feeding supplement  237 mL Oral BID BM   heparin  5,000 Units Subcutaneous Q8H   insulin aspart  0-5 Units Subcutaneous QHS   insulin aspart  0-9 Units Subcutaneous TID WC   insulin glargine-yfgn  10 Units Subcutaneous BID   linaclotide  290 mcg Oral QAC breakfast   pantoprazole  40 mg Oral Daily   senna-docusate  1 tablet Oral BID   topiramate  100 mg Oral BID   Continuous Infusions:  azithromycin 500 mg (11/07/23 1558)   cefTRIAXone (ROCEPHIN)  IV 2 g (11/07/23 1441)    LOS: 2 days   Time spent: 55 mins  Rian Koon Laural Benes, MD How to contact the [facility] Attending or Consulting provider 7A - 7P or covering provider during after hours 7P -7A, for this patient?  Check the care team in [facility] and look for a) attending/consulting [facility] provider listed and b) the [facility]  team listed Log into [email or web address] to find provider on call.  Locate the [facility] provider you are looking for under [facility] and page to a number that you can be directly reached. If you still have difficulty reaching the provider, please page the [facility] (Director on Call) for the Hospitalists listed on amion for assistance.  11/08/2023, 12:36 PM

## 2023-11-08 NOTE — Evaluation (Signed)
Clinical/Bedside Swallow Evaluation Patient Details  Name: Brian Nielsen MRN: [id number] Date of Birth: 06-20-1961  Today's Date: 11/08/2023 Time: SLP Start Time (ACUTE ONLY): 1310 SLP Stop Time (ACUTE ONLY): 1338 SLP Time Calculation (min) (ACUTE ONLY): 28 min  Past Medical History:  Past Medical History:  Diagnosis Date   Adult celiac disease 2015   followed by dr Servando Snare (GI)   Anemia associated with chronic renal failure    hematologist--- dr Sarajane Jews;  treated with iron infusions   Benign localized prostatic hyperplasia with lower urinary tract symptoms (LUTS)    urologist--- dr Retta Diones   Cancer [facility]) 08/18/2023   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   Charcot foot due to diabetes mellitus (HCC)    Chronic constipation    CKD (chronic kidney disease), stage III [facility])    nephrologist--- dr Wolfgang Phoenix;  renal bx 11-20-2021   Diverticulosis of colon    Edema of both lower extremities    GERD (gastroesophageal reflux disease)    History of adenomatous polyp of colon    HTN (hypertension)    Hyperlipidemia, mixed    Peripheral neuropathy    Peyronie's disease    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 meatal stricture    Retinopathy due to secondary diabetes mellitus (HCC)    both eyes  treated with injecitons   Thoracic spondylosis without myelopathy 11/22/2016   Type 2 diabetes mellitus [facility])    endocrinologist--- dr nida   Vitamin D deficiency    Wears hearing aid in both ears    Past Surgical History:  Past Surgical History:  Procedure Laterality Date   BIOPSY N/A 09/07/2021   Procedure: BIOPSY;  Surgeon: Midge Minium, MD;  Location: [facility];  Service: Endoscopy;  Laterality: N/A;   CATARACT EXTRACTION W/ INTRAOCULAR LENS IMPLANT Bilateral 2023   COLONOSCOPY N/A 05/03/2014   Procedure: COLONOSCOPY;  Surgeon: West Bali, MD;  [facility];  Service: Endoscopy;  Laterality: N/A;  12:00   COLONOSCOPY WITH PROPOFOL N/A 09/07/2021    Procedure: COLONOSCOPY WITH PROPOFOL;  Surgeon: Midge Minium, MD;  Location: [facility];  Service: Endoscopy;  Laterality: N/A;   CYSTOSCOPY WITH INJECTION N/A 07/26/2023   Procedure: CYSTOSCOPY WITH BOTOX INJECTION 100 UNITS;  Surgeon: Noel Christmas, MD;  Location: [facility];  Service: Urology;  Laterality: N/A;  30 minutes   CYSTOSCOPY WITH URETHRAL DILATATION N/A 01/10/2023   Procedure: CYSTOSCOPY;  Surgeon: Marcine Matar, MD;  Location: [facility];  Service: Urology;  Laterality: N/A;   ESOPHAGOGASTRODUODENOSCOPY N/A 05/03/2014   Procedure: ESOPHAGOGASTRODUODENOSCOPY (EGD);  Surgeon: West Bali, MD;  [facility];  Service: Endoscopy;  Laterality: N/A;   ESOPHAGOGASTRODUODENOSCOPY (EGD) WITH PROPOFOL N/A 06/03/2020   Procedure: ESOPHAGOGASTRODUODENOSCOPY (EGD) WITH PROPOFOL;  Surgeon: Regis Bill, MD;  Location: [facility] END[facility];  Service: Endoscopy;  Laterality: N/A;   ESOPHAGOGASTRODUODENOSCOPY (EGD) WITH PROPOFOL N/A 09/07/2021   Procedure: ESOPHAGOGASTRODUODENOSCOPY (EGD) WITH PROPOFOL;  Surgeon: Midge Minium, MD;  Location: [facility];  Service: Endoscopy;  Laterality: N/A;  Diabetic   IR BONE MARROW BIOPSY & ASPIRATION  04/27/2023   MEATOTOMY N/A 01/10/2023   Procedure: MEATOTOMY ADULT;  Surgeon: Marcine Matar, MD;  Location: [facility];  Service: Urology;  Laterality: N/A;   PARS PLANA VITRECTOMY Right 03/21/2021   Procedure: PARS PLANA VITRECTOMY 25 GAUGE WITH INJECTION OF ANTIBIOTICS FOR ENDOPHTHALMITIS;  Surgeon: Carmela Rima, MD;  Location: [facility] OR;  Service: Ophthalmology;  Laterality: Right;   POLYPECTOMY N/A 09/07/2021  Procedure: POLYPECTOMY;  Surgeon: Midge Minium, MD;  Location: [facility];  Service: Endoscopy;  Laterality: N/A;   REPAIR EXTENSOR TENDON Right 08/11/2022   Procedure: EXTENSOR CARPI ULNARIS TENDON DEBRIDEMENT, POSSIBLE SUBSHEATH RECONSTRUCTION;  Surgeon: Marlyne Beards,  MD;  Location: [facility];  Service: Orthopedics;  Laterality: Right;  or regional plus MAC   HPI:  62 y.o. male with medical history significant for HTN, DM, CKD3, head and neck cancer, anemia, suprapubic catheter.  Patient presented to the ED with complaints of a fall and confusion.   On my evaluation patient is awake alert but fidgety, able to answer simple questions, he remembers falling but not give a lot of history.  Spouse and son who are at bedside.  Confusion started about a week ago, with patient saying things that did not make sense, they thought this was related to patient's pain medication - they are not sure exactly how much he was taking but they believe he was taking more than prescribed.  He fell about a week ago, and then fell again yesterday.  They report yesterday he was standing and weaving about the bed and fell.  Patient denies dizziness, denies chest pain no difficulty breathing, but he is having pain to his left side of his chest.  They report a chronic productive cough that is unchanged related to his throat cancer and treatment. Spouse reports some initial difficulty breathing today on EMS arrival, but this had resolved on arrival to the ED.   He is on ulcers in his mouth, but the report reports stable oral intake.  No vomiting.  He is constipated.  No fevers. Right tonsil HPV+ SCC. Started radiation 11/8. The total radiation dose will be 7000 cGy at 200 cGy/fraction for a total of 35 fractions, treated once a day. Chemotherapy administered concurrently. Plan for tx completion 12/20. BSE requested.   Clinical swallow evaluation at [facility] last week: <<Pt presents with clinical signs of at least moderate oropharyngeal dysphagia. Though observed only with sips of water (refused solid trials 2/2 pain), inconsistent overt s/sx aspiration noted, characterized by immediate cough postprandially. Cough appeared strong/effective in clearing audible material. Pt reported coughing  with water only; denied coughing with other liquids (including supplemental shakes) or current soft solid diet. Refused other liquids (juice) offered. Cued smaller cup sip and R head turn clinically effective in eliminating overt s/sx aspiration; no coughing/choking observed, and pt with subjective report of improved swallow function/comfort. Voice clear across observation. Pt denied sensation of pharyngeal residue with liquid/solid intake at this time. Denied xerostomia. Pt with complaint of dysgeusia ("no taste"), thin/thick mucus production, and odynophagia (rated 10/10). Pt intermittently using magic mouthwash (+lidocaine viscous), completing salt/baking soda rinses 4-5x daily, +oxy. Pt reported pain with verbalization, mastication, and mouth opening. Pt's MIO WFL at 40 mm via [employer] measuring scale (~52mm WFL); instructed on a home trismus therapy/prevention program (massage and stretching). Team monitoring weight loss. Pt completed the EAT-10 with score of 27/40. MBSS scheduled 12/24. Education provided re role of SLP, aspiration risk/precautions and strategies, clinical vs instrumental (MBSS) assessment, POC, and further recommendations described below. Pt agreeable to MBSS and ongoing SLP follow up, however unable to make currently scheduled study (12/24). Schedulers notified. All questions/concerns addressed. SLP to follow up.Recommendations:  Continue unrestricted solid/thin liquids, with gravitation towards soft/moist foods for swallow ease/safety. Modify based on XRT symptoms. Sit fully upright, take slow and small bites/sips, use liquid rinse PRN. Continue to turn head to R when swallowing liquids.  Monitor s/sx aspiration and notify MD/SLP with concern.  Home trismus therapy/prevention program.  F/u clinical swallow evaluation ideally ~3-4 weeks during next clinic visit.  MBSS as scheduled.  Follow-up Referral Recommendations : Skilled SLP services to treat dysphagia and address aspiration risk  factors, Follow-up modified barium swallow  Recommended Form of Medications: Whole, With liquid (tolerating single pills with single sips)>>   Assessment / Plan / Recommendation  Clinical Impression  Clinical swallow evaluation completed at bedside. Pt is currently being treated for right tonsillar cancer and was seen by SLP at [facility] last week for a clinical swallow evaluation (see recommendations copied within this note). Pt now with rib fracture, confusion, and oral ulcerations. Pt intermittently responsed appropriately during the evaluation, but stated that he was at work and was "too busy working" to swallow when asked. Pt with perseverative actions and verbal responses and required cues to complete simple tasks (move tongue, close mouth, cough). Pt with facial grimace when masticating peanut butter crackers due to ulcerations in mouth. Pt with one episide of delayed cough after straw sip thin liquid and vocal quality remained clear. Recommend D2/ground due to mouth pain with masticating solids and thin liquids via cup sips with 100% supervision with PO due to Pt confusion. GIven Pt's rib fractures, if he aspirates nectar-thick liquids, it would likely be more difficult for him to clear and could cause greater problems, so would advance to thin. Pt was apparently scheduled for MBSS at [facility] today. Pt would benefit from MBSS, however it won't be able to be  completed until Thursday or Friday due to holiday tomorrow. Pt may also benefit from Magic Mouthwash for mouth pain-will message MD. SLP will follow. SLP Visit Diagnosis: Dysphagia, unspecified (R13.10)    Aspiration Risk  Mild aspiration risk;Risk for inadequate nutrition/hydration    Diet Recommendation Dysphagia 2 (Fine chop);Thin liquid    Liquid Administration via: Cup;No straw Medication Administration: Whole meds with puree Supervision: Full supervision/cueing for compensatory strategies;Patient able to self feed Compensations: Slow  rate;Small sips/bites;Clear throat intermittently Postural Changes: Seated upright at 90 degrees;Remain upright for at least 30 minutes after po intake    Other  Recommendations Oral Care Recommendations: Oral care BID;Staff/trained caregiver to provide oral care    Recommendations for follow up therapy are one component of a multi-disciplinary discharge planning process, led by the attending physician.  Recommendations may be updated based on patient status, additional functional criteria and insurance authorization.  Follow up Recommendations Follow physician's recommendations for discharge plan and follow up therapies      Assistance Recommended at Discharge    Functional Status Assessment Patient has had a recent decline in their functional status and demonstrates the ability to make significant improvements in function in a reasonable and predictable amount of time.  Frequency and Duration min 2x/week  1 week       Prognosis Prognosis for improved oropharyngeal function: Fair Barriers to Reach Goals: Severity of deficits      Swallow Study   General Date of Onset: 11/06/23 HPI: 62 y.o. male with medical history significant for HTN, DM, CKD3, head and neck cancer, anemia, suprapubic catheter.  Patient presented to the ED with complaints of a fall and confusion.   On my evaluation patient is awake alert but fidgety, able to answer simple questions, he remembers falling but not give a lot of history.  Spouse and son who are at bedside.  Confusion started about a week ago, with patient saying things that did not  make sense, they thought this was related to patient's pain medication - they are not sure exactly how much he was taking but they believe he was taking more than prescribed.  He fell about a week ago, and then fell again yesterday.  They report yesterday he was standing and weaving about the bed and fell.  Patient denies dizziness, denies chest pain no difficulty breathing, but he  is having pain to his left side of his chest.  They report a chronic productive cough that is unchanged related to his throat cancer and treatment. Spouse reports some initial difficulty breathing today on EMS arrival, but this had resolved on arrival to the ED.   He is on ulcers in his mouth, but the report reports stable oral intake.  No vomiting.  He is constipated.  No fevers. Right tonsil HPV+ SCC. Started radiation 11/8. The total radiation dose will be 7000 cGy at 200 cGy/fraction for a total of 35 fractions, treated once a day. Chemotherapy administered concurrently. Plan for tx completion 12/20. BSE requested. Type of Study: Bedside Swallow Evaluation Previous Swallow Assessment: seen at [facility] for BSE last week Diet Prior to this Study: Dysphagia 2 (finely chopped);Mildly thick liquids (Level 2, nectar thick) Temperature Spikes Noted: No Respiratory Status: Room air History of Recent Intubation: No Behavior/Cognition: Alert;Cooperative;Pleasant mood;Confused;Requires cueing;Doesn't follow directions Oral Cavity Assessment: Other (comment) (ulcerations along left sides of tongue and soft palate) Oral Care Completed by SLP: Yes Oral Cavity - Dentition: Adequate natural dentition;Poor condition Vision: Functional for self-feeding Self-Feeding Abilities: Needs assist (due to delirium) Patient Positioning: Upright in bed Baseline Vocal Quality: Normal Volitional Cough: Weak (Pt now with rib fracture) Volitional Swallow: Able to elicit    Oral/Motor/Sensory Function Overall Oral Motor/Sensory Function: Within functional limits   Ice Chips Ice chips: Within functional limits Presentation: Spoon   Thin Liquid Thin Liquid: Impaired Presentation: Cup;Self Fed;Spoon;Straw Pharyngeal  Phase Impairments: Cough - Delayed (delayed cough x1 after straw sips thin)    Nectar Thick Nectar Thick Liquid: Within functional limits Presentation: Cup;Self Fed   Honey Thick Honey Thick Liquid: Not tested    Puree Puree: Within functional limits Presentation: Spoon   Solid     Solid: Impaired Presentation: Spoon Oral Phase Impairments: Reduced lingual movement/coordination Oral Phase Functional Implications: Impaired mastication;Prolonged oral transit (odynophagia)     Thank you,  Havery Moros, CCC-SLP [phone]  Ashden Sonnenberg 11/08/2023,2:04 PM

## 2023-11-08 NOTE — Progress Notes (Signed)
At 0400, pts CBG was 75. Pt asymptomatic. Provided pt with a snack. CBG recheck at 0507 was 88. Will continue to monitor.

## 2023-11-08 NOTE — TOC Progression Note (Signed)
Transition of Care [facility]) - Progression Note    Patient Details  Name: Brian Nielsen MRN: [id number] Date of Birth: 12-20-1960  [facility]) CM/SW Contact  Catalina Gravel, LCSW Phone Number: 11/08/2023, 8:35 AM  Clinical Narrative:     Pt seen by PT and recommended HHPT, CSW attempted to contact pt and visit bedside to discuss PT referral.  Pt sleeping. CSW spoke to spouse via pt's mobile number,  and discussed recommendation.  Spouse expressed concerns that pt was to be dc yesterday but was not steady on feet, did not fall, but she was concerned suggesting he could not DC, also expressed he has other issues and nearly needs 24 hour care in her opinion.  Spouse plans to visit hospital today CSW shared that she can reach out to Dr.  or nursing staff regarding medical concerns.  TOC to follow.    Expected Discharge Plan: Home/Self Care Barriers to Discharge: No Barriers Identified  Expected Discharge Plan and Services In-house Referral: Clinical Social Work     Living arrangements for the past 2 months: Single Family Home Expected Discharge Date: 11/08/23                                     Social Determinants of Health (SDOH) Interventions SDOH Screenings   Food Insecurity: No Food Insecurity (11/06/2023)  Housing: Unknown (11/06/2023)  Transportation Needs: No Transportation Needs (11/06/2023)  Utilities: Not At Risk (11/06/2023)  Financial Resource Strain: Low Risk  (02/08/2023)   Received from [facility]  Physical Activity: Patient Declined (02/08/2023)   Received from [facility], [facility]  Social Connections: Socially Integrated (02/08/2023)   Received from [facility], [facility]  Stress: No Stress Concern Present (02/08/2023)   [facility], [facility]  Tobacco Use: High Risk (11/06/2023)    Readmission Risk Interventions    11/07/2023   11:04 AM  Readmission Risk Prevention Plan  Transportation  Screening Complete  PCP or Specialist Appt within 3-5 Days Complete  HRI or Home Care Consult Complete  Social Work Consult for Recovery Care Planning/Counseling Complete  Palliative Care Screening Not Applicable  Medication Review Oceanographer) Complete

## 2023-11-08 NOTE — TOC Progression Note (Signed)
Transition of Care [facility]) - Progression Note    Patient Details  Name: Brian Nielsen MRN: [id number] Date of Birth: 1960-12-04  Transition of Care [facility]) CM/SW Contact  Catalina Gravel, LCSW Phone Number: 11/08/2023, 2:27 PM  Clinical Narrative:     PASSR requested #[id number] A, FL2 completed- needs cosign note.  TOC to follow.  Expected Discharge Plan: Home/Self Care Barriers to Discharge: Continued Medical Work up  Expected Discharge Plan and Services In-house Referral: Clinical Social Work     Living arrangements for the past 2 months: Single Family Home Expected Discharge Date: 11/08/23                                     Social Determinants of Health (SDOH) Interventions SDOH Screenings   Food Insecurity: No Food Insecurity (11/06/2023)  Housing: Unknown (11/06/2023)  Transportation Needs: No Transportation Needs (11/06/2023)  Utilities: Not At Risk (11/06/2023)  Financial Resource Strain: Low Risk  (02/08/2023)   Received from [facility], [facility]  Physical Activity: Patient Declined (02/08/2023)   Received from [facility], [facility]  Social Connections: Socially Integrated (02/08/2023)   Received from [facility], [facility]  Stress: No Stress Concern Present (02/08/2023)   Received from [facility], [facility]  Tobacco Use: High Risk (11/06/2023)    Readmission Risk Interventions    11/07/2023   11:04 AM  Readmission Risk Prevention Plan  Transportation Screening Complete  PCP or Specialist Appt within 3-5 Days Complete  HRI or Home Care Consult Complete  Social Work Consult for Recovery Care Planning/Counseling Complete  Palliative Care Screening Not Applicable  Medication Review Oceanographer) Complete

## 2023-11-09 DIAGNOSIS — E871 Hypo-osmolality and hyponatremia: Secondary | ICD-10-CM | POA: Diagnosis not present

## 2023-11-09 DIAGNOSIS — G9341 Metabolic encephalopathy: Secondary | ICD-10-CM | POA: Diagnosis not present

## 2023-11-09 DIAGNOSIS — N179 Acute kidney failure, unspecified: Secondary | ICD-10-CM | POA: Diagnosis not present

## 2023-11-09 DIAGNOSIS — N400 Enlarged prostate without lower urinary tract symptoms: Secondary | ICD-10-CM | POA: Diagnosis not present

## 2023-11-09 LAB — URINE CULTURE: Culture: >=100000 — AB

## 2023-11-09 LAB — BASIC METABOLIC PANEL WITH GFR
Anion gap: 8 (ref 5–15)
BUN: 52 mg/dL — ABNORMAL HIGH (ref 8–23)
CO2: 20 mmol/L — ABNORMAL LOW (ref 22–32)
Calcium: 8.6 mg/dL — ABNORMAL LOW (ref 8.9–10.3)
Chloride: 114 mmol/L — ABNORMAL HIGH (ref 98–111)
Creatinine, Ser: 2.5 mg/dL — ABNORMAL HIGH (ref 0.61–1.24)
GFR, Estimated: 28 mL/min — ABNORMAL LOW
Glucose, Bld: 173 mg/dL — ABNORMAL HIGH (ref 70–99)
Potassium: 3.6 mmol/L (ref 3.5–5.1)
Sodium: 142 mmol/L (ref 135–145)

## 2023-11-09 LAB — GLUCOSE, CAPILLARY
Glucose-Capillary: 149 mg/dL — ABNORMAL HIGH (ref 70–99)
Glucose-Capillary: 147 mg/dL — ABNORMAL HIGH (ref 70–99)
Glucose-Capillary: 151 mg/dL — ABNORMAL HIGH (ref 70–99)

## 2023-11-09 MED ORDER — CLONIDINE HCL 0.1 MG PO TABS
0.2000 mg | ORAL_TABLET | Freq: Two times a day (BID) | ORAL | Status: DC
  Administered 2023-11-09 (×2): 0.2 mg via ORAL
  Filled 2023-11-09 (×3): qty 2

## 2023-11-09 MED ORDER — LISINOPRIL 10 MG PO TABS
20.0000 mg | ORAL_TABLET | Freq: Every day | ORAL | Status: DC
  Administered 2023-11-09 – 2023-11-11 (×3): 20 mg via ORAL
  Filled 2023-11-09 (×3): qty 2

## 2023-11-09 MED ORDER — AMITRIPTYLINE HCL 25 MG PO TABS
25.0000 mg | ORAL_TABLET | Freq: Every day | ORAL | Status: DC
  Administered 2023-11-09 – 2023-11-13 (×5): 25 mg via ORAL
  Filled 2023-11-09 (×5): qty 1

## 2023-11-09 NOTE — Plan of Care (Signed)

## 2023-11-09 NOTE — Progress Notes (Signed)
Patient not placed on CPAP as he is still confused with mitts on.He also has a sore on RT side of neck which mightn't prohibit mask.

## 2023-11-09 NOTE — Plan of Care (Signed)
  Problem: Education: Goal: Knowledge of General Education information will improve Description: Including pain rating scale, medication(s)/side effects and non-pharmacologic comfort measures Outcome: Not Progressing   Problem: Health Behavior/Discharge Planning: Goal: Ability to manage health-related needs will improve Outcome: Not Progressing   Problem: Clinical Measurements: Goal: Ability to maintain clinical measurements within normal limits will improve Outcome: Progressing Goal: Will remain free from infection Outcome: Progressing Goal: Diagnostic test results will improve Outcome: Progressing Goal: Respiratory complications will improve Outcome: Progressing Goal: Cardiovascular complication will be avoided Outcome: Progressing   Problem: Activity: Goal: Risk for activity intolerance will decrease Outcome: Not Progressing   Problem: Nutrition: Goal: Adequate nutrition will be maintained Outcome: Progressing   Problem: Coping: Goal: Level of anxiety will decrease Outcome: Not Progressing   Problem: Elimination: Goal: Will not experience complications related to bowel motility Outcome: Progressing Goal: Will not experience complications related to urinary retention Outcome: Progressing   Problem: Pain Management: Goal: General experience of comfort will improve Outcome: Progressing   Problem: Safety: Goal: Ability to remain free from injury will improve Outcome: Progressing   Problem: Skin Integrity: Goal: Risk for impaired skin integrity will decrease Outcome: Progressing   Problem: Education: Goal: Ability to describe self-care measures that may prevent or decrease complications (Diabetes Survival Skills Education) will improve Outcome: Not Progressing   Problem: Coping: Goal: Ability to adjust to condition or change in health will improve Outcome: Not Progressing   Problem: Fluid Volume: Goal: Ability to maintain a balanced intake and output will  improve Outcome: Progressing   Problem: Health Behavior/Discharge Planning: Goal: Ability to identify and utilize available resources and services will improve Outcome: Progressing Goal: Ability to manage health-related needs will improve Outcome: Progressing   Problem: Metabolic: Goal: Ability to maintain appropriate glucose levels will improve Outcome: Progressing   Problem: Nutritional: Goal: Maintenance of adequate nutrition will improve Outcome: Progressing Goal: Progress toward achieving an optimal weight will improve Outcome: Progressing   Problem: Tissue Perfusion: Goal: Adequacy of tissue perfusion will improve Outcome: Progressing   Problem: Safety: Goal: Non-violent Restraint(s) Outcome: Progressing

## 2023-11-09 NOTE — Progress Notes (Signed)
CBG: 209. The data from the glucometer did not transfer

## 2023-11-09 NOTE — Plan of Care (Signed)
  Problem: Education: Goal: Knowledge of General Education information will improve Description: Including pain rating scale, medication(s)/side effects and non-pharmacologic comfort measures Outcome: Progressing   Problem: Health Behavior/Discharge Planning: Goal: Ability to manage health-related needs will improve Outcome: Progressing   Problem: Clinical Measurements: Goal: Ability to maintain clinical measurements within normal limits will improve Outcome: Progressing Goal: Will remain free from infection Outcome: Progressing Goal: Diagnostic test results will improve Outcome: Progressing Goal: Respiratory complications will improve Outcome: Progressing Goal: Cardiovascular complication will be avoided Outcome: Progressing   Problem: Activity: Goal: Risk for activity intolerance will decrease Outcome: Progressing   Problem: Nutrition: Goal: Adequate nutrition will be maintained Outcome: Progressing   Problem: Coping: Goal: Level of anxiety will decrease Outcome: Progressing   Problem: Elimination: Goal: Will not experience complications related to bowel motility Outcome: Progressing Goal: Will not experience complications related to urinary retention Outcome: Progressing   Problem: Pain Management: Goal: General experience of comfort will improve Outcome: Progressing   Problem: Safety: Goal: Ability to remain free from injury will improve Outcome: Progressing   Problem: Skin Integrity: Goal: Risk for impaired skin integrity will decrease Outcome: Progressing   Problem: Education: Goal: Ability to describe self-care measures that may prevent or decrease complications (Diabetes Survival Skills Education) will improve Outcome: Progressing Goal: Individualized Educational Video(s) Outcome: Progressing   Problem: Coping: Goal: Ability to adjust to condition or change in health will improve Outcome: Progressing   Problem: Fluid Volume: Goal: Ability to  maintain a balanced intake and output will improve Outcome: Progressing   Problem: Health Behavior/Discharge Planning: Goal: Ability to identify and utilize available resources and services will improve Outcome: Progressing Goal: Ability to manage health-related needs will improve Outcome: Progressing   Problem: Metabolic: Goal: Ability to maintain appropriate glucose levels will improve Outcome: Progressing   Problem: Nutritional: Goal: Maintenance of adequate nutrition will improve Outcome: Progressing Goal: Progress toward achieving an optimal weight will improve Outcome: Progressing   Problem: Skin Integrity: Goal: Risk for impaired skin integrity will decrease Outcome: Progressing   Problem: Tissue Perfusion: Goal: Adequacy of tissue perfusion will improve Outcome: Progressing   Problem: Safety: Goal: Non-violent Restraint(s) Outcome: Progressing

## 2023-11-09 NOTE — Progress Notes (Signed)
PROGRESS NOTE   Brian Nielsen  FAO:[id number] DOB: August 17, 1961 DOA: 11/06/2023 PCP: Chyrl Civatte, FNP   Chief Complaint  Patient presents with   Fall   Rib Injury   Level of care: Telemetry  Brief Admission History:  62 y.o. male with medical history significant for HTN, DM, CKD3, head and neck cancer, anemia, suprapubic catheter.  Patient presented to the ED with complaints of a fall and confusion. On my evaluation patient is awake alert but fidgety, able to answer simple questions, he remembers falling but not give a lot of history.  Spouse and son who are at bedside.  Confusion started about a week ago, with patient saying things that did not make sense, they thought this was related to patient's pain medication - they are not sure exactly how much he was taking but they believe he was taking more than prescribed.  He fell about a week ago, and then fell again yesterday.  They report yesterday he was standing and weaving about the bed and fell.  Patient denies dizziness, denies chest pain no difficulty breathing, but he is having pain to his left side of his chest.  They report a chronic productive cough that is unchanged related to his throat cancer and treatment. Spouse reports some initial difficulty breathing today on EMS arrival, but this had resolved on arrival to the ED. He is on ulcers in his mouth, but the report reports stable oral intake.  No vomiting.  He is constipated.  No fevers.   ED Course: Blood pressure systolic down to 69/62 improved with fluid bolus currently low 90s.  O2 sats-documented at 88% on room air- per EDP- was an error. Patient was 98% on room on my air evaluation.  Creatinine elevated at 5.38.  Sodium 129.  Mildly elevated liver enzymes AST 198, ALT 183.  Lactic acid 2.1.  Pelvic X-ray negative for acute abnormality.  CT chest abdomen and pelvis-acute fractures-posterior left 10th and 11th ribs, small volume pleural fluid within left base-fluid measures water  density.  No pneumothorax.  Multifocal subsegmental atelectasis, groundglass attenuation posterior consolidative change left lung-large contusion or atelectasis.  IV ceftriaxone and azithromycin started.  1.5 L bolus given.  Assessment and Plan:  Acute metabolic encephalopathy Likely 2/2 opiates, AKI, hypotension.  Presenting with falls, confusion.  CT shows pulmonary contusion.  Chronic indwelling suprapubic catheter with UA showing large leukocytes and many bacteria.   -Obtain head CT - negative for acute findings  -Obtain urine culture, klebsiella pneumonia -Hydrated with IV fluids -Limit opiates -Continue IV ceftriaxone   -Check procalcitonin>>7.44  -suprapubic catheter obstructed may have been contributing to confusion, relieved now and flowing freely  -telemonitoring for safety  Klebsiella UTI  - continue IV ceftriaxone daily  - follow up C&S results   Acute fractures left ribs 10/11 - supportive measures ordered - pain management as needed  - incentive spirometry ordered   Hyponatremia - resolved  Mild hyponatremia sodium 129.  Baseline 1 37-1 39. -Hydrated with normal saline  Transaminitis AST 198, ALT 183.  Normal T. bili 0.4, ALP 73.  No abdominal pain.  CT abdomen and pelvis without focal liver abnormality, status postcholecystectomy no biliary dilatation.  -Trend for now likely from hypotension, LFTs trending down  -Hold Lipitor -Per Care Everywhere recent hepatitis B- 09/2023 nonreactive. - Check Hep C per GI - outpatient GI follow up with Dr. Servando [facility] GI   Fall at home, initial encounter Likely secondary to encephalopathy, opiates, hypotension/dehydration.  CT  chest shows left 10th and 11th rib fractures, suggest pulmonary contusion/atelectasis.  He reports a chronic cough and some dyspnea prior to arrival in the ED. Not hypoxic with O2 sats 98% on room air currently.  Afebrile.  WBC 5.  Lactic acid 2.1-likely  2/2 hypotension. - Most likely pulmonary contusion,  IV ceftriaxone and azithromycin started in ED, continue for now with low threshold to discontinue -PT eval recommending CIR - placed order for CIR evaluation, CIR declined patient; TOC working on SNF   -fall precautions   Acute kidney injury superimposed on CKD stage 3b  Cr-5.38, baseline CKD stage IIIb/IV , with creatinine 1.8-2.  Prerenal, initial hypotension in ED has improved with fluids.  On lisinopril, Lasix on med list but he is not taking this. -possibly secondary to suprapubic catheter obstruction which is now relieved and urine flowing freely   Oropharynx cancer  Completed chemoradiation therapy.  Follows with [facility] oncology.  OSA (obstructive sleep apnea) Resume CPAP nightly  Diabetes mellitus with stage 3 chronic kidney disease  Controlled.  A1c 6.2. - SSi- S -Resume home Lantus at reduced dose 10 units twice daily -Hold Mounjaro, Jardiance (due to UTI) CBG (last 3)  Recent Labs    11/09/23 0727 11/09/23 1121 11/09/23 1623  GLUCAP 149* 147* 151*   Essential hypertension, benign Presented with hypotension. -resume lisinopril -Resume clonidine 0.2 twice daily  -IV hydralazine PRN  BPH (benign prostatic hyperplasia) Chronic suprapubic catheter.  Last changed per spouse about 2 weeks ago.  Follows with urology.  DVT prophylaxis: sq heparin  Code Status: Full  Family Communication: wife updated bedside 12/24 Disposition: CIR recommended by PT    Consultants:   Procedures:   Antimicrobials:  CTX 12/22>> Azithromycin 12/22>>   Subjective: Intermittently agitated, this morning he say she is in hospital and says he knows where he is    Objective: Vitals:   11/09/23 0400 11/09/23 0530 11/09/23 0850 11/09/23 1407  BP: (!) 172/79 (!) 155/75 (!) 169/73 (!) 194/78  Pulse:   72 71  Resp:      Temp:    98.4 F (36.9 C)  TempSrc:    Oral  SpO2:    98%    Intake/Output Summary (Last 24 hours) at 11/09/2023 1715 Last data filed at 11/09/2023 1127 Gross per 24  hour  Intake --  Output 1700 ml  Net -1700 ml   There were no vitals filed for this visit. Examination:  General exam: Appears calm and comfortable  Respiratory system: Clear to auscultation. Respiratory effort normal. Cardiovascular system: normal S1 & S2 heard. No JVD, murmurs, rubs, gallops or clicks. No pedal edema. Gastrointestinal system: Abdomen is nondistended, soft and nontender. No organomegaly or masses felt. Normal bowel sounds heard. GU: suprapubic catheter flowing freely of amber urine.  Central nervous system: Alert and oriented. No focal neurological deficits. Extremities: Symmetric 5 x 5 power. Skin: No rashes, lesions or ulcers. Psychiatry: intermittently confused; Judgement and insight appear normal. Mood & affect appropriate.   Data Reviewed: I have personally reviewed following labs and imaging studies  CBC: Recent Labs  Lab 11/06/23 1322 11/07/23 0427  WBC 5.0 5.7  HGB 9.0* 8.0*  HCT 28.1* 25.1*  MCV 93.0 94.0  PLT 292 199    Basic Metabolic Panel: Recent Labs  Lab 11/06/23 1322 11/07/23 0427 11/08/23 0355 11/09/23 0427  NA 129* 135 138 142  K 5.6* 4.7 3.7 3.6  CL 99 107 109 114*  CO2 17* 20* 18* 20*  GLUCOSE 123*  51* 87 173*  BUN 85* 74* 64* 52*  CREATININE 5.38* 4.41* 3.36* 2.50*  CALCIUM 8.4* 8.0* 8.3* 8.6*  PHOS  --   --  3.6  --     CBG: Recent Labs  Lab 11/08/23 1146 11/08/23 2224 11/09/23 0727 11/09/23 1121 11/09/23 1623  GLUCAP 129* 162* 149* 147* 151*    Recent Results (from the past 240 hours)  Urine Culture (for pregnant, neutropenic or urologic patients or patients with an indwelling urinary catheter)     Status: Abnormal   Collection Time: 11/06/23  1:22 PM   Specimen: Urine, Clean Catch  Result Value Ref Range Status   Specimen Description   Final    URINE, CLEAN CATCH Performed at [facility], [address], [place], [place] [address]    Special Requests   Final    NONE Performed at [facility], [address], [place], [place] [address]    Culture >=100,000 COLONIES/mL KLEBSIELLA PNEUMONIAE (A)  Final   Report Status 11/09/2023 FINAL  Final   Organism ID, Bacteria KLEBSIELLA PNEUMONIAE (A)  Final      Susceptibility   Klebsiella pneumoniae - MIC*    AMPICILLIN RESISTANT Resistant     CEFAZOLIN <=4 SENSITIVE Sensitive     CEFEPIME <=0.12 SENSITIVE Sensitive     CEFTRIAXONE <=0.25 SENSITIVE Sensitive     CIPROFLOXACIN <=0.25 SENSITIVE Sensitive     GENTAMICIN <=1 SENSITIVE Sensitive     IMIPENEM <=0.25 SENSITIVE Sensitive     NITROFURANTOIN 32 SENSITIVE Sensitive     TRIMETH/SULFA <=20 SENSITIVE Sensitive     AMPICILLIN/SULBACTAM 4 SENSITIVE Sensitive     PIP/TAZO <=4 SENSITIVE Sensitive ug/mL    * >=100,000 COLONIES/mL KLEBSIELLA PNEUMONIAE     Radiology Studies: No results found.  Scheduled Meds:  aspirin EC  81 mg Oral Daily   Chlorhexidine Gluconate Cloth  6 each Topical Daily   cloNIDine  0.2 mg Oral BID   DULoxetine  20 mg Oral Daily   feeding supplement  237 mL Oral BID BM   heparin  5,000 Units Subcutaneous Q8H   insulin aspart  0-5 Units Subcutaneous QHS   insulin aspart  0-9 Units Subcutaneous TID WC   insulin glargine-yfgn  10 Units Subcutaneous Daily   linaclotide  290 mcg Oral QAC breakfast   pantoprazole  40 mg Oral Daily   senna-docusate  1 tablet Oral QHS   sodium bicarbonate  650 mg Oral BID   topiramate  100 mg Oral BID   Continuous Infusions:  cefTRIAXone (ROCEPHIN)  IV 2 g (11/09/23 1436)    LOS: 3 days   Time spent: 57 mins  Alden Feagan Laural Benes, MD How to contact the [facility] Attending or Consulting provider 7A - 7P or covering provider during after hours 7P -7A, for this patient?  Check the care team in [facility] and look for a) attending/consulting [facility] provider listed and b) the [facility] team listed Log into [email or web address] to find provider on call.  Locate the [facility] provider you are looking for under [facility] and page to a number that you can be directly  reached. If you still have difficulty reaching the provider, please page the [facility] (Director on Call) for the Hospitalists listed on amion for assistance.  11/09/2023, 5:15 PM

## 2023-11-09 NOTE — Progress Notes (Signed)
Patient in mittens, unable to place on CPAP tonight.

## 2023-11-09 NOTE — Progress Notes (Signed)
Patient placed on AVASYS. Patient is confused, high fall risk and impulsive.  Patient as attempted to get up without assistance several times. Patient placed with tele sitter for safety precautions.

## 2023-11-10 DIAGNOSIS — N400 Enlarged prostate without lower urinary tract symptoms: Secondary | ICD-10-CM | POA: Diagnosis not present

## 2023-11-10 DIAGNOSIS — G9341 Metabolic encephalopathy: Secondary | ICD-10-CM | POA: Diagnosis not present

## 2023-11-10 DIAGNOSIS — E871 Hypo-osmolality and hyponatremia: Secondary | ICD-10-CM | POA: Diagnosis not present

## 2023-11-10 DIAGNOSIS — N179 Acute kidney failure, unspecified: Secondary | ICD-10-CM | POA: Diagnosis not present

## 2023-11-10 LAB — GLUCOSE, CAPILLARY
Glucose-Capillary: 183 mg/dL — ABNORMAL HIGH (ref 70–99)
Glucose-Capillary: 155 mg/dL — ABNORMAL HIGH (ref 70–99)
Glucose-Capillary: 209 mg/dL — ABNORMAL HIGH (ref 70–99)
Glucose-Capillary: 237 mg/dL — ABNORMAL HIGH (ref 70–99)
Glucose-Capillary: 161 mg/dL — ABNORMAL HIGH (ref 70–99)
Glucose-Capillary: 168 mg/dL — ABNORMAL HIGH (ref 70–99)
Glucose-Capillary: 334 mg/dL — ABNORMAL HIGH (ref 70–99)
Glucose-Capillary: 163 mg/dL — ABNORMAL HIGH (ref 70–99)

## 2023-11-10 MED ORDER — PROSOURCE PLUS PO LIQD
30.0000 mL | Freq: Three times a day (TID) | ORAL | Status: DC
  Administered 2023-11-10 – 2023-11-14 (×11): 30 mL via ORAL
  Filled 2023-11-10 (×12): qty 30

## 2023-11-10 MED ORDER — OXYCODONE HCL 5 MG PO TABS
2.5000 mg | ORAL_TABLET | Freq: Three times a day (TID) | ORAL | Status: DC | PRN
  Administered 2023-11-12: 2.5 mg via ORAL
  Filled 2023-11-10 (×2): qty 1

## 2023-11-10 MED ORDER — CLONIDINE HCL 0.1 MG PO TABS
0.2000 mg | ORAL_TABLET | Freq: Three times a day (TID) | ORAL | Status: DC
  Administered 2023-11-10 – 2023-11-14 (×13): 0.2 mg via ORAL
  Filled 2023-11-10 (×10): qty 2

## 2023-11-10 MED ORDER — METOPROLOL TARTRATE 25 MG PO TABS: 25.0000 mg | ORAL_TABLET | Freq: Two times a day (BID) | ORAL | Status: DC

## 2023-11-10 MED ORDER — ENSURE ENLIVE PO LIQD
237.0000 mL | Freq: Three times a day (TID) | ORAL | Status: DC
  Administered 2023-11-10 – 2023-11-14 (×8): 237 mL via ORAL

## 2023-11-10 MED ORDER — METOPROLOL TARTRATE 25 MG PO TABS
12.5000 mg | ORAL_TABLET | Freq: Two times a day (BID) | ORAL | Status: DC
  Administered 2023-11-10 – 2023-11-14 (×9): 12.5 mg via ORAL
  Filled 2023-11-10 (×9): qty 1

## 2023-11-10 MED ORDER — ADULT MULTIVITAMIN W/MINERALS CH
1.0000 | ORAL_TABLET | Freq: Every day | ORAL | Status: DC
  Administered 2023-11-10 – 2023-11-14 (×5): 1 via ORAL
  Filled 2023-11-10 (×5): qty 1

## 2023-11-10 NOTE — Progress Notes (Signed)
Patient is still altered and in and out of mittens on hands. Unable to safely wear CPAP at night. Unit at bedside if things change.

## 2023-11-10 NOTE — Evaluation (Signed)
Physical Therapy Evaluation Patient Details Name: Brian Nielsen MRN: [id number] DOB: Sep 06, 1961 Today's Date: 11/10/2023  History of Present Illness  Brian Nielsen is a 62 y.o. male with medical history significant for HTN, DM, CKD3, head and neck cancer, anemia, suprapubic catheter.  Patient presented to the ED with complaints of a fall and confusion.  On my evaluation patient is awake alert but fidgety, able to answer simple questions, he remembers falling but not give a lot of history.  Spouse and son who are at bedside.  Confusion started about a week ago, with patient saying things that did not make sense, they thought this was related to patient's pain medication - they are not sure exactly how much he was taking but they believe he was taking more than prescribed.  He fell about a week ago, and then fell again yesterday.  They report yesterday he was standing and weaving about the bed and fell.  Patient denies dizziness, denies chest pain no difficulty breathing, but he is having pain to his left side of his chest.  They report a chronic productive cough that is unchanged related to his throat cancer and treatment. Spouse reports some initial difficulty breathing today on EMS arrival, but this had resolved on arrival to the ED.  He is on ulcers in his mouth, but the report reports stable oral intake.  No vomiting.  He is constipated.  No fevers.   Clinical Impression  Patient had difficulty completing bed mobility due to c/o severe pain in abdomen during supine<>sitting, unsteady on feet and limited to a few side steps before having to sit due to weakness and fall risk.  Patient tolerated sitting up in chair after therapy.  Patient will benefit from continued skilled physical therapy in hospital and recommended venue below to increase strength, balance, endurance for safe ADLs and gait.          If plan is discharge home, recommend the following: A lot of help with walking and/or transfers;A lot  of help with bathing/dressing/bathroom;Assistance with cooking/housework;Assist for transportation;Help with stairs or ramp for entrance   Can travel by private vehicle   Yes    Equipment Recommendations Rolling walker (2 wheels)  Recommendations for Other Services       Functional Status Assessment Patient has had a recent decline in their functional status and demonstrates the ability to make significant improvements in function in a reasonable and predictable amount of time.     Precautions / Restrictions Precautions Precautions: Fall Restrictions Weight Bearing Restrictions Per Provider Order: No      Mobility  Bed Mobility Overal bed mobility: Needs Assistance Bed Mobility: Supine to Sit, Sit to Supine     Supine to sit: Mod assist Sit to supine: Mod assist   General bed mobility comments: increased time, labored movement    Transfers Overall transfer level: Needs assistance Equipment used: Rolling walker (2 wheels) Transfers: Sit to/from Stand, Bed to chair/wheelchair/BSC Sit to Stand: Min assist, Contact guard assist   Step pivot transfers: Min assist, Mod assist       General transfer comment: labored movement, increased time    Ambulation/Gait Ambulation/Gait assistance: Min assist, Mod assist Gait Distance (Feet): 5 Feet Assistive device: Rolling walker (2 wheels) Gait Pattern/deviations: Decreased step length - right, Decreased step length - left, Decreased stride length Gait velocity: slow     General Gait Details: limited to a few steps at bedside due to c/o fatigue and generalized weakness  Stairs  Wheelchair Mobility     Tilt Bed    Modified Rankin (Stroke Patients Only)       Balance Overall balance assessment: Needs assistance Sitting-balance support: Feet supported, No upper extremity supported Sitting balance-Leahy Scale: Fair Sitting balance - Comments: seated at EOB   Standing balance support: Bilateral  upper extremity supported, Reliant on assistive device for balance, During functional activity Standing balance-Leahy Scale: Fair Standing balance comment: using RW                             Pertinent Vitals/Pain Pain Assessment Pain Assessment: Faces Faces Pain Scale: Hurts little more Pain Location: "all over" Pain Descriptors / Indicators: Grimacing, Discomfort Pain Intervention(s): Limited activity within patient's tolerance, Monitored during session, Repositioned    Home Living Family/patient expects to be discharged to:: Private residence Living Arrangements: Spouse/significant other Available Help at Discharge: Family;Available 24 hours/day Type of Home: Mobile home Home Access: Level entry       Home Layout: One level Home Equipment: Agricultural consultant (2 wheels);BSC/3in1;Grab bars - tub/shower Additional Comments: per chart review    Prior Function Prior Level of Function : Needs assist       Physical Assist : Mobility (physical);ADLs (physical) Mobility (physical): Bed mobility;Transfers;Gait;Stairs   Mobility Comments: Household ambulation using RW PRN ADLs Comments: Assisted by family     Extremity/Trunk Assessment   Upper Extremity Assessment Upper Extremity Assessment: Defer to OT evaluation    Lower Extremity Assessment Lower Extremity Assessment: Generalized weakness    Cervical / Trunk Assessment Cervical / Trunk Assessment: Normal  Communication   Communication Communication: No apparent difficulties Cueing Techniques: Verbal cues;Tactile cues  Cognition Arousal: Alert Behavior During Therapy: WFL for tasks assessed/performed Overall Cognitive Status: No family/caregiver present to determine baseline cognitive functioning                                          General Comments      Exercises     Assessment/Plan    PT Assessment Patient needs continued PT services  PT Problem List Decreased  strength;Decreased activity tolerance;Decreased balance;Decreased mobility       PT Treatment Interventions DME instruction;Gait training;Stair training;Functional mobility training;Therapeutic activities;Therapeutic exercise;Balance training;Patient/family education    PT Goals (Current goals can be found in the Care Plan section)  Acute Rehab PT Goals Patient Stated Goal: return home after rehab PT Goal Formulation: With patient Time For Goal Achievement: 11/18/23 Potential to Achieve Goals: Good    Frequency Min 4X/week     Co-evaluation PT/OT/SLP Co-Evaluation/Treatment: Yes Reason for Co-Treatment: To address functional/ADL transfers PT goals addressed during session: Mobility/safety with mobility;Balance;Proper use of DME OT goals addressed during session: ADL's and self-care       AM-PAC PT "6 Clicks" Mobility  Outcome Measure Help needed turning from your back to your side while in a flat bed without using bedrails?: A Lot Help needed moving from lying on your back to sitting on the side of a flat bed without using bedrails?: A Lot Help needed moving to and from a bed to a chair (including a wheelchair)?: A Lot Help needed standing up from a chair using your arms (e.g., wheelchair or bedside chair)?: A Lot Help needed to walk in hospital room?: A Lot Help needed climbing 3-5 steps with a railing? : A Lot 6 Click Score:  12    End of Session   Activity Tolerance: Patient tolerated treatment well;Patient limited by fatigue Patient left: in chair;with call bell/phone within reach;with chair alarm set Nurse Communication: Mobility status PT Visit Diagnosis: Unsteadiness on feet (R26.81);Other abnormalities of gait and mobility (R26.89);Muscle weakness (generalized) (M62.81)    Time: 0981-1914 PT Time Calculation (min) (ACUTE ONLY): 15 min   Charges:   PT Evaluation $PT Eval Low Complexity: 1 Low PT Treatments $Therapeutic Activity: 8-22 mins PT General  Charges $$ ACUTE PT VISIT: 1 Visit         12:21 PM, 11/10/23 Ocie Bob, MPT Physical Therapist with [facility] [phone] office [phone] mobile phone

## 2023-11-10 NOTE — Progress Notes (Signed)
   11/10/23 1700  Vitals  BP (!) 190/80  BP Method Manual  MEWS COLOR  MEWS Score Color Green  MEWS Score  MEWS Temp 0  MEWS Systolic 0  MEWS Pulse 0  MEWS RR 0  MEWS LOC 0  MEWS Score 0   PRN medication administered

## 2023-11-10 NOTE — Evaluation (Signed)
Occupational Therapy Evaluation Patient Details Name: [name] MRN: [id number] DOB: 1961-03-08 Today's Date: 11/10/2023   History of Present Illness Brian Nielsen is a 62 y.o. male with medical history significant for HTN, DM, CKD3, head and neck cancer, anemia, suprapubic catheter.  Patient presented to the ED with complaints of a fall and confusion.  On my evaluation patient is awake alert but fidgety, able to answer simple questions, he remembers falling but not give a lot of history.  Spouse and son who are at bedside.  Confusion started about a week ago, with patient saying things that did not make sense, they thought this was related to patient's pain medication - they are not sure exactly how much he was taking but they believe he was taking more than prescribed.  He fell about a week ago, and then fell again yesterday.  They report yesterday he was standing and weaving about the bed and fell.  Patient denies dizziness, denies chest pain no difficulty breathing, but he is having pain to his left side of his chest.  They report a chronic productive cough that is unchanged related to his throat cancer and treatment. Spouse reports some initial difficulty breathing today on EMS arrival, but this had resolved on arrival to the ED.  He is on ulcers in his mouth, but the report reports stable oral intake.  No vomiting.  He is constipated.  No fevers.   Clinical Impression   Pt agreeable to OT and PT co-evaluation. Pt seemed confused but was mostly oriented. Pt required Min to mod A for transfer to bed due to time and cuing. Pt also required mod A for bed mobility with signs of increased pain. B UE generally weak. Per clinical judgment pt near mod to max A for lower body dressing based on difficulty reaching B LE. No family present for evaluation today. Pt left in the chair with call bell within reach and chair alarm set. Pt will benefit from continued OT in the hospital and recommended venue below to  increase strength, balance, and endurance for safe ADL's.         If plan is discharge home, recommend the following: A little help with walking and/or transfers;A lot of help with bathing/dressing/bathroom;Assistance with cooking/housework;Help with stairs or ramp for entrance;Direct supervision/assist for medications management;Assist for transportation    Functional Status Assessment  Patient has had a recent decline in their functional status and demonstrates the ability to make significant improvements in function in a reasonable and predictable amount of time.  Equipment Recommendations  None recommended by OT           Precautions / Restrictions Precautions Precautions: Fall Restrictions Weight Bearing Restrictions Per Provider Order: No      Mobility Bed Mobility Overal bed mobility: Needs Assistance Bed Mobility: Supine to Sit, Sit to Supine     Supine to sit: Mod assist Sit to supine: Mod assist   General bed mobility comments: increased time; increased signs of pain; assist to lift B LE into bed and out of bed.    Transfers Overall transfer level: Needs assistance Equipment used: Rolling walker (2 wheels) Transfers: Sit to/from Stand, Bed to chair/wheelchair/BSC Sit to Stand: Min assist, Contact guard assist     Step pivot transfers: Min assist, Mod assist     General transfer comment: labored movement, increased time      Balance Overall balance assessment: Needs assistance Sitting-balance support: Feet supported, No upper extremity supported Sitting balance-Leahy Scale:  Fair Sitting balance - Comments: seated at EOB   Standing balance support: Bilateral upper extremity supported Standing balance-Leahy Scale: Fair Standing balance comment: using RW                           ADL either performed or assessed with clinical judgement   ADL Overall ADL's : Needs assistance/impaired     Grooming: Set up;Minimal assistance;Sitting    Upper Body Bathing: Set up;Minimal assistance;Sitting   Lower Body Bathing: Maximal assistance;Sitting/lateral leans   Upper Body Dressing : Set up;Minimal assistance;Sitting   Lower Body Dressing: Maximal assistance;Sitting/lateral leans   Toilet Transfer: Minimal assistance;Moderate assistance;Rolling walker (2 wheels);Stand-pivot Statistician Details (indicate cue type and reason): simulated via EOB to chair transfer Toileting- Clothing Manipulation and Hygiene: Moderate assistance;Maximal assistance;Sitting/lateral lean       Functional mobility during ADLs: Minimal assistance;Moderate assistance;Rolling walker (2 wheels)       Vision Baseline Vision/History: 1 Wears glasses Ability to See in Adequate Light: 1 Impaired Patient Visual Report: No change from baseline Vision Assessment?: No apparent visual deficits     Perception Perception: Not tested       Praxis Praxis: Not tested       Pertinent Vitals/Pain Pain Assessment Pain Assessment: Faces Faces Pain Scale: Hurts little more Pain Location: "all over" Pain Descriptors / Indicators: Grimacing, Discomfort Pain Intervention(s): Limited activity within patient's tolerance, Monitored during session, Repositioned     Extremity/Trunk Assessment Upper Extremity Assessment Upper Extremity Assessment: Generalized weakness   Lower Extremity Assessment Lower Extremity Assessment: Defer to PT evaluation   Cervical / Trunk Assessment Cervical / Trunk Assessment: Normal   Communication Communication Communication: No apparent difficulties Cueing Techniques: Verbal cues;Tactile cues   Cognition Arousal: Alert Behavior During Therapy: WFL for tasks assessed/performed Overall Cognitive Status: No family/caregiver present to determine baseline cognitive functioning                                                        Home Living Family/patient expects to be discharged to:: Private  residence Living Arrangements: Spouse/significant other Available Help at Discharge: Family;Available 24 hours/day Type of Home: Mobile home Home Access: Level entry     Home Layout: One level     Bathroom Shower/Tub: Producer, television/film/video: Standard Bathroom Accessibility: Yes   Home Equipment: Agricultural consultant (2 wheels);BSC/3in1;Grab bars - tub/shower   Additional Comments: per chart review      Prior Functioning/Environment Prior Level of Function : Needs assist       Physical Assist : Mobility (physical) Mobility (physical): Bed mobility;Transfers;Gait;Stairs   Mobility Comments: Household ambulation using RW PRN ADLs Comments: Assisted by family (per chart review; pt reported independence with ADL and IADL's.)        OT Problem List: Decreased strength;Decreased range of motion;Decreased activity tolerance;Impaired balance (sitting and/or standing);Decreased cognition      OT Treatment/Interventions: Self-care/ADL training;Therapeutic exercise;Therapeutic activities;Patient/family education    OT Goals(Current goals can be found in the care plan section) Acute Rehab OT Goals Patient Stated Goal: agreeable to improving function prior to return home OT Goal Formulation: With patient Time For Goal Achievement: 11/24/23 Potential to Achieve Goals: Good  OT Frequency: Min 2X/week    Co-evaluation PT/OT/SLP Co-Evaluation/Treatment: Yes Reason for Co-Treatment: To address functional/ADL  transfers   OT goals addressed during session: ADL's and self-care                       End of Session Equipment Utilized During Treatment: Rolling walker (2 wheels)  Activity Tolerance: Patient tolerated treatment well;Other (comment) (may have been limited due to pt being medicated prior to session) Patient left: in chair;with call bell/phone within reach;with chair alarm set  OT Visit Diagnosis: Unsteadiness on feet (R26.81);Other abnormalities of gait and  mobility (R26.89);Muscle weakness (generalized) (M62.81);History of falling (Z91.81);Other symptoms and signs involving cognitive function                Time: 0981-1914 OT Time Calculation (min): 14 min Charges:  OT General Charges $OT Visit: 1 Visit OT Evaluation $OT Eval Low Complexity: 1 Low  Johnnye Sandford OT, MOT  Danie Chandler 11/10/2023, 9:39 AM

## 2023-11-10 NOTE — Progress Notes (Addendum)
PROGRESS NOTE   Brian Nielsen  WGN:[id number] DOB: 1961/06/17 DOA: 11/06/2023 PCP: Chyrl Civatte, FNP   Chief Complaint  Patient presents with   Fall   Rib Injury   Level of care: Telemetry  Brief Admission History:  62 y.o. male with medical history significant for HTN, DM, CKD3, head and neck cancer, anemia, suprapubic catheter.  Patient presented to the ED with complaints of a fall and confusion. On my evaluation patient is awake alert but fidgety, able to answer simple questions, he remembers falling but not give a lot of history.  Spouse and son who are at bedside.  Confusion started about a week ago, with patient saying things that did not make sense, they thought this was related to patient's pain medication - they are not sure exactly how much he was taking but they believe he was taking more than prescribed.  He fell about a week ago, and then fell again yesterday.  They report yesterday he was standing and weaving about the bed and fell.  Patient denies dizziness, denies chest pain no difficulty breathing, but he is having pain to his left side of his chest.  They report a chronic productive cough that is unchanged related to his throat cancer and treatment. Spouse reports some initial difficulty breathing today on EMS arrival, but this had resolved on arrival to the ED. He is on ulcers in his mouth, but the report reports stable oral intake.  No vomiting.  He is constipated.  No fevers.   ED Course: Blood pressure systolic down to 78/46 improved with fluid bolus currently low 90s.  O2 sats-documented at 88% on room air- per EDP- was an error. Patient was 98% on room on my air evaluation.  Creatinine elevated at 5.38.  Sodium 129.  Mildly elevated liver enzymes AST 198, ALT 183.  Lactic acid 2.1.  Pelvic X-ray negative for acute abnormality.  CT chest abdomen and pelvis-acute fractures-posterior left 10th and 11th ribs, small volume pleural fluid within left base-fluid measures water  density.  No pneumothorax.  Multifocal subsegmental atelectasis, groundglass attenuation posterior consolidative change left lung-large contusion or atelectasis.  IV ceftriaxone and azithromycin started.  1.5 L bolus given.  Assessment and Plan:  Acute metabolic encephalopathy Likely 2/2 opiates, AKI, hypotension.  Presenting with falls, confusion.  CT shows pulmonary contusion.  Chronic indwelling suprapubic catheter with UA showing large leukocytes and many bacteria.   -Obtain head CT - negative for acute findings  -Obtain urine culture-->> klebsiella pneumonia -Hydrated with IV fluids -Limit opiates -Continue IV ceftriaxone   -Check procalcitonin>>7.44  -suprapubic catheter was obstructed and may have been contributed to confusion, relieved now and flowing freely  -telemonitoring for safety -haloperidol ordered to be used only for agitation -if persists would request a teleneurology consult -wife requests to limit or eliminate opioids if possible  Klebsiella UTI  - continue IV ceftriaxone daily  - follow up C&S results   Acute fractures left ribs 10/11 - supportive measures ordered - pain management as needed  - incentive spirometry ordered   Hyponatremia - resolved  Mild hyponatremia sodium 129.  Baseline 137-139. -Hydrated with normal saline for brief time  Transaminitis AST 198, ALT 183.  Normal T. bili 0.4, ALP 73.  No abdominal pain.  CT abdomen and pelvis without focal liver abnormality, status postcholecystectomy no biliary dilatation.  -Trend for now likely from hypotension, LFTs trending down  -Hold Lipitor -Per Care Everywhere recent hepatitis B- 09/2023 nonreactive. - Check Hep C  per GI - outpatient GI follow up with Dr. Servando Snare [facility]   Fall at home, initial encounter Likely secondary to encephalopathy, opiates, hypotension/dehydration.  CT chest shows left 10th and 11th rib fractures, suggest pulmonary contusion/atelectasis.  He reports a chronic cough and some  dyspnea prior to arrival in the ED. Not hypoxic with O2 sats 98% on room air currently.  Afebrile.  WBC 5.  Lactic acid 2.1-likely  2/2 hypotension. - Most likely pulmonary contusion, IV ceftriaxone and azithromycin started in ED, continue for now with low threshold to discontinue -PT eval recommending CIR - placed order for CIR evaluation, CIR declined patient; TOC working on SNF   -fall precautions   Acute kidney injury superimposed on CKD stage 3b  Cr-5.38, baseline CKD stage IIIb/IV , with creatinine 1.8-2.  Prerenal, initial hypotension in ED has improved with fluids.  On lisinopril, Lasix on med list but he is not taking this. -possibly secondary to suprapubic catheter obstruction which is now relieved and urine flowing freely   Intake/Output Summary (Last 24 hours) at 11/10/2023 1531 Last data filed at 11/10/2023 0930 Gross per 24 hour  Intake --  Output 1825 ml  Net -1825 ml   Oropharynx cancer  Completed chemoradiation therapy.  Follows with [facility] oncology.  OSA (obstructive sleep apnea) Resume CPAP nightly  Diabetes mellitus with stage 3 chronic kidney disease  Controlled.  A1c 6.2. - SSi- S -Resume home Lantus at reduced dose 10 units twice daily -Hold Mounjaro, Jardiance (due to UTI) CBG (last 3)  Recent Labs    11/10/23 0310 11/10/23 0741 11/10/23 1148  GLUCAP 161* 168* 334*   Essential hypertension, benign Presented with hypotension but now having rebound high blood pressures. -resumed lisinopril -Resumed clonidine 0.2 mg TID  -IV hydralazine PRN  BPH (benign prostatic hyperplasia) Chronic suprapubic catheter.  Last changed per spouse about 2 weeks ago.  Follows with urology.  DVT prophylaxis: sq heparin  Code Status: Full  Family Communication: wife updated bedside 12/24, telephone 12/26  Disposition: CIR recommended by PT    Consultants:   Procedures:   Antimicrobials:  CTX 12/22>> Azithromycin 12/22>>   Subjective: He remains intermittently  confused. He is agreeable to SNF rehab.  He is not complaining of any pain or discomfort at this time.     Objective: Vitals:   11/09/23 2336 11/10/23 0024 11/10/23 0534 11/10/23 0623  BP: (!) 196/85 (!) 165/82 (!) 189/76 (!) 183/76  Pulse: 68 71 70   Resp:   16   Temp:   (!) 97.5 F (36.4 C)   TempSrc:      SpO2:   99%     Intake/Output Summary (Last 24 hours) at 11/10/2023 1528 Last data filed at 11/10/2023 0930 Gross per 24 hour  Intake --  Output 1825 ml  Net -1825 ml   There were no vitals filed for this visit. Examination:  General exam: Appears calm and comfortable  Respiratory system: Clear to auscultation. Respiratory effort normal. Cardiovascular system: normal S1 & S2 heard. No JVD, murmurs, rubs, gallops or clicks. No pedal edema. Gastrointestinal system: Abdomen is nondistended, soft and nontender. No organomegaly or masses felt. Normal bowel sounds heard. GU: suprapubic catheter flowing freely of amber urine.  Central nervous system: Alert and oriented. No focal neurological deficits. Extremities: Symmetric 5 x 5 power. Skin: large radiation burn to right side of neck.  Psychiatry: intermittently confused; Judgement and insight appear normal. Mood & affect appropriate.   Data Reviewed: I have personally reviewed  following labs and imaging studies  CBC: Recent Labs  Lab 11/06/23 1322 11/07/23 0427  WBC 5.0 5.7  HGB 9.0* 8.0*  HCT 28.1* 25.1*  MCV 93.0 94.0  PLT 292 199    Basic Metabolic Panel: Recent Labs  Lab 11/06/23 1322 11/07/23 0427 11/08/23 0355 11/09/23 0427  NA 129* 135 138 142  K 5.6* 4.7 3.7 3.6  CL 99 107 109 114*  CO2 17* 20* 18* 20*  GLUCOSE 123* 51* 87 173*  BUN 85* 74* 64* 52*  CREATININE 5.38* 4.41* 3.36* 2.50*  CALCIUM 8.4* 8.0* 8.3* 8.6*  PHOS  --   --  3.6  --     CBG: Recent Labs  Lab 11/09/23 1623 11/09/23 2140 11/10/23 0310 11/10/23 0741 11/10/23 1148  GLUCAP 151* 237* 161* 168* 334*    Recent Results  (from the past 240 hours)  Urine Culture (for pregnant, neutropenic or urologic patients or patients with an indwelling urinary catheter)     Status: Abnormal   Collection Time: 11/06/23  1:22 PM   Specimen: Urine, Clean Catch  Result Value Ref Range Status   Specimen Description   Final    URINE, CLEAN CATCH Performed at [facility], [address], [place], [place] [address]    Special Requests   Final    NONE Performed at [facility], [address], [place], [place] [address]    Culture >=100,000 COLONIES/mL KLEBSIELLA PNEUMONIAE (A)  Final   Report Status 11/09/2023 FINAL  Final   Organism ID, Bacteria KLEBSIELLA PNEUMONIAE (A)  Final      Susceptibility   Klebsiella pneumoniae - MIC*    AMPICILLIN RESISTANT Resistant     CEFAZOLIN <=4 SENSITIVE Sensitive     CEFEPIME <=0.12 SENSITIVE Sensitive     CEFTRIAXONE <=0.25 SENSITIVE Sensitive     CIPROFLOXACIN <=0.25 SENSITIVE Sensitive     GENTAMICIN <=1 SENSITIVE Sensitive     IMIPENEM <=0.25 SENSITIVE Sensitive     NITROFURANTOIN 32 SENSITIVE Sensitive     TRIMETH/SULFA <=20 SENSITIVE Sensitive     AMPICILLIN/SULBACTAM 4 SENSITIVE Sensitive     PIP/TAZO <=4 SENSITIVE Sensitive ug/mL    * >=100,000 COLONIES/mL KLEBSIELLA PNEUMONIAE     Radiology Studies: No results found.  Scheduled Meds:  (feeding supplement) PROSource Plus  30 mL Oral TID BM   amitriptyline  25 mg Oral QHS   aspirin EC  81 mg Oral Daily   Chlorhexidine Gluconate Cloth  6 each Topical Daily   cloNIDine  0.2 mg Oral TID   DULoxetine  20 mg Oral Daily   feeding supplement  237 mL Oral TID BM   heparin  5,000 Units Subcutaneous Q8H   insulin aspart  0-5 Units Subcutaneous QHS   insulin aspart  0-9 Units Subcutaneous TID WC   insulin glargine-yfgn  10 Units Subcutaneous Daily   linaclotide  290 mcg Oral QAC breakfast   lisinopril  20 mg Oral Daily   metoprolol tartrate  12.5 mg Oral BID   multivitamin with minerals  1 tablet Oral Daily   pantoprazole   40 mg Oral Daily   senna-docusate  1 tablet Oral QHS   sodium bicarbonate  650 mg Oral BID   topiramate  100 mg Oral BID   Continuous Infusions:  cefTRIAXone (ROCEPHIN)  IV 2 g (11/10/23 1437)    LOS: 4 days   Time spent: 53 mins  Alim Cattell, MD How to contact the [facility] Attending or Consulting provider 7A - 7P or covering provider  during after hours 7P -7A, for this patient?  Check the care team in [facility] and look for a) attending/consulting TRH provider listed and b) the [facility] team listed Log into [email or web address] to find provider on call.  Locate the [facility] provider you are looking for under [facility] and page to a number that you can be directly reached. If you still have difficulty reaching the provider, please page the [facility] (Director on Call) for the Hospitalists listed on amion for assistance.  11/10/2023, 3:28 PM

## 2023-11-10 NOTE — TOC Progression Note (Signed)
Transition of Care [facility]) - Progression Note    Patient Details  Name: Brian Nielsen MRN: [id number] Date of Birth: 07/05/1961  Transition of Care [facility]) CM/SW Contact  Elliot Gault, LCSW Phone Number: 11/10/2023, 12:30 PM  Clinical Narrative:     TOC following. Spoke with pt's wife about SNF rehab for pt based on PT/CIR recommendation. She states that she would like for pt to go to STR at [facility] before he comes home. CMS provider options reviewed. Will refer as requested. Explained that pt will need insurance auth as well and she is aware that selected SNF will confirm his benefits and request auth.  Will follow.  Expected Discharge Plan: Skilled Nursing Facility Barriers to Discharge: Continued Medical Work up  Expected Discharge Plan and Services In-house Referral: Clinical Social Work   Post Acute Care Choice: Skilled Nursing Facility Living arrangements for the past 2 months: Single Family Home Expected Discharge Date: 11/08/23                                     Social Determinants of Health (SDOH) Interventions SDOH Screenings   Food Insecurity: No Food Insecurity (11/06/2023)  Housing: Unknown (11/06/2023)  Transportation Needs: No Transportation Needs (11/06/2023)  Utilities: Not At Risk (11/06/2023)  Financial Resource Strain: Low Risk  (02/08/2023)   Received from [facility], [facility]  Physical Activity: Patient Declined (02/08/2023)   Received from [facility], [facility]  Social Connections: Socially Integrated (02/08/2023)   Received from [facility], [facility]  Stress: No Stress Concern Present (02/08/2023)   Received from [facility], [facility]  Tobacco Use: High Risk (11/06/2023)    Readmission Risk Interventions    11/07/2023   11:04 AM  Readmission Risk Prevention Plan  Transportation Screening Complete  PCP or Specialist Appt within 3-5 Days Complete  HRI or Home Care Consult Complete  Social Work Consult for  Recovery Care Planning/Counseling Complete  Palliative Care Screening Not Applicable  Medication Review Oceanographer) Complete

## 2023-11-10 NOTE — Plan of Care (Signed)
  Problem: Acute Rehab OT Goals (only OT should resolve) Goal: Pt. Will Perform Grooming Flowsheets (Taken 11/10/2023 0942) Pt Will Perform Grooming: with modified independence Goal: Pt. Will Perform Upper Body Dressing Flowsheets (Taken 11/10/2023 0942) Pt Will Perform Upper Body Dressing: with modified independence Goal: Pt. Will Perform Lower Body Dressing Flowsheets (Taken 11/10/2023 0942) Pt Will Perform Lower Body Dressing: with modified independence Goal: Pt. Will Transfer To Toilet Flowsheets (Taken 11/10/2023 [phone]) Pt Will Transfer to Toilet: with modified independence Goal: Pt. Will Perform Toileting-Clothing Manipulation Flowsheets (Taken 11/10/2023 0942) Pt Will Perform Toileting - Clothing Manipulation and hygiene: with modified independence Goal: Pt/Caregiver Will Perform Home Exercise Program Flowsheets (Taken 11/10/2023 [phone]) Pt/caregiver will Perform Home Exercise Program:  Increased ROM  Increased strength  Both right and left upper extremity  Independently  Aleeah Greeno OT, MOT

## 2023-11-10 NOTE — Progress Notes (Signed)
Initial Nutrition Assessment  DOCUMENTATION CODES:   Not applicable  INTERVENTION:   -Obtain new wt -Continue dysphagia 2 diet -Ensure Enlive po TD, each supplement provides 350 kcal and 20 grams of protein.  -30 ml Prosource Plus TID, each supplement provides 100 kcals and 15 grams protein -MVI with minerals daily  NUTRITION DIAGNOSIS:   Increased nutrient needs related to acute illness as evidenced by estimated needs.  GOAL:   Patient will meet greater than or equal to 90% of their needs  MONITOR:   PO intake, Supplement acceptance, Diet advancement  REASON FOR ASSESSMENT:   Malnutrition Screening Tool    ASSESSMENT:   Pt with medical history significant for HTN, DM, CKD3, head and neck cancer, anemia, suprapubic catheter.  Patient presented with complaints of a fall and confusion  Pt admitted with acute metabolic encephalopathy likely secondary to opiates, AKI, and hypotension.   12/24- s/p BSE- dysphagia 2 diet with thin liquids  Pt unavailable at time of visit. Attempted to speak with pt via call to hospital room phone, however, unable to reach. RD unable to obtain further nutrition-related history or complete nutrition-focused physical exam at this time.    Reviwed I/O's: -2.8 L x 24 hours and -7 L since admission  UOP: 2.8 L x 24 hours   Per H&P, confusion started approximately one week PTA. Head CT negative for acute findings.   Discussed cancelled on 11/07/23 secondary to fall. Family with concerns that pt is still very unsteady on his feet.   Pt on a dysphagia 2 diet with thin liquids as of 11/08/23. Pt with poor oral intake. Noted meal completions 10%. Pt has Ensure ordered and has bee drinking with exception of last dose. Per oncology notes, pt has had difficulty swallowing since June and reports 30# weight loss due to this. 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 Pt  has seen SLP as an outpatient and has MBSS scheduled for a later date.   Reviewed wt hx; pt has experienced a 9.9% wt loss from 05/11/23 to 10/10/23, which is significant for time frame. No updated weight since admission. RD will obtain new weight to better assess weight trends. Noted pt on mounjaro PTA, which may be contributing to weight loss.   Medications reviewed and include lopressor, protonix, senokot, and sodium bicarbonate.   Per TOC notes, plan SNF placement once medically stable for discharge.   Lab Results  Component Value Date   HGBA1C 12.7 (H) 01/11/2017   PTA DM medications are 35 units insulin glargine daily, 15 units mounjaro weekly,and 10 mg jardiance daily. Per CareEverywhere, last Hgb A1c was 6.2 on 08/22/23.  Labs reviewed: CBGS: 161-334 (inpatient orders for glycemic control are 0-5 units insulin aspart daily at bedtime, 0-9 units insulin aspart TID with meals, an 10 units insulin glargine-yfgn daily).    Diet Order:   Diet Order             DIET DYS 2 Fluid consistency: Thin  Diet effective now                   EDUCATION NEEDS:   No education needs have been identified at this time  Skin:  Skin Assessment: Reviewed RN Assessment  Last BM:  11/09/23  Height:   Ht Readings from Last 1 Encounters:  10/07/23 5\' 8"  (1.727 m)    Weight:   Wt Readings from Last 1 Encounters:  10/10/23 83  kg    Ideal Body Weight:  67 kg  BMI:  There is no height or weight on file to calculate BMI.  Estimated Nutritional Needs:   Kcal:  1900-2100  Protein:  100-115 grams  Fluid:  > 1.9 L    Levada Schilling, RD, LDN, CDCES Registered Dietitian III Certified Diabetes Care and Education Specialist If unable to reach this RD, please use "RD Inpatient" group chat on secure chat between hours of 8am-4 pm daily

## 2023-11-10 NOTE — [place] FL2 (Signed)
[place] MEDICAID FL2 LEVEL OF CARE FORM     IDENTIFICATION  Patient Name: Brian Nielsen Birthdate: 09-21-1961 Sex: male Admission Date (Current Location): 11/06/2023  [facility] and [place] Number:  [employer] and Address:  [facility],  [address] [place] [address]      Provider Number: [id number]  Attending Physician Name and Address:  Cleora Fleet, MD  Relative Name and Phone Number:  Theadore, Poist (Spouse)  [phone] (Mobile)    Current Level of Care: Hospital Recommended Level of Care: Skilled Nursing Facility Prior Approval Number:    Date Approved/Denied:   PASRR Number: [id number]  Discharge Plan: SNF    Current Diagnoses: Patient Active Problem List   Diagnosis Date Noted   Acute metabolic encephalopathy 11/06/2023   Acute kidney injury superimposed on CKD (HCC) 11/06/2023   Fall at home, initial encounter 11/06/2023   Rib fractures 11/06/2023   Suprapubic catheter (HCC) 11/06/2023   Transaminitis 11/06/2023   Hyponatremia 11/06/2023   Oropharynx cancer (HCC) 10/24/2023   OSA (obstructive sleep apnea) 12/28/2022   Extensor carpi ulnaris tendinitis 05/06/2022   Loss of weight    Polyp of ascending colon    Gastritis without bleeding    Symptomatic anemia 05/28/2021   Thoracic spondylosis without myelopathy 11/22/2016   Vitamin D deficiency 11/22/2016   HLD (hyperlipidemia) 10/19/2016   BPH (benign prostatic hyperplasia) 10/19/2016   Essential hypertension, benign 10/19/2016   Diabetes mellitus with stage 3 chronic kidney disease (HCC) 10/19/2016   Celiac sprue 05/23/2014   Anemia, iron deficiency 04/19/2014   GERD (gastroesophageal reflux disease) 04/19/2014    Orientation RESPIRATION BLADDER Height & Weight     Self, Place  Normal Indwelling catheter Weight:   Height:     BEHAVIORAL SYMPTOMS/MOOD NEUROLOGICAL BOWEL NUTRITION STATUS      Continent Diet (see dc summary)  AMBULATORY STATUS COMMUNICATION OF  NEEDS Skin   Extensive Assist Verbally Normal                       Personal Care Assistance Level of Assistance  Bathing, Feeding, Dressing Bathing Assistance: Limited assistance Feeding assistance: Limited assistance Dressing Assistance: Limited assistance     Functional Limitations Info  Sight, Hearing, Speech Sight Info: Impaired Hearing Info: Impaired Speech Info: Impaired    SPECIAL CARE FACTORS FREQUENCY  PT (By licensed PT), OT (By licensed OT)     PT Frequency: 5x week OT Frequency: 5x week            Contractures Contractures Info: Not present    Additional Factors Info  Code Status, Allergies Code Status Info: Full Allergies Info: Codeine           Current Medications (11/10/2023):  This is the current hospital active medication list Current Facility-Administered Medications  Medication Dose Route Frequency Provider Last Rate Last Admin   amitriptyline (ELAVIL) tablet 25 mg  25 mg Oral QHS Johnson, Clanford L, MD   25 mg at 11/09/23 2139   aspirin EC tablet 81 mg  81 mg Oral Daily Johnson, Clanford L, MD   81 mg at 11/10/23 0826   cefTRIAXone (ROCEPHIN) 2 g in sodium chloride 0.9 % 100 mL IVPB  2 g Intravenous Q24H Johnson, Clanford L, MD 200 mL/hr at 11/09/23 1436 2 g at 11/09/23 1436   Chlorhexidine Gluconate Cloth 2 % PADS 6 each  6 each Topical Daily Cleora Fleet, MD   6 each at 11/10/23 [phone]  cloNIDine (CATAPRES) tablet 0.2 mg  0.2 mg Oral TID Laural Benes, Clanford L, MD   0.2 mg at 11/10/23 0825   DULoxetine (CYMBALTA) DR capsule 20 mg  20 mg Oral Daily Johnson, Clanford L, MD   20 mg at 11/10/23 0826   feeding supplement (ENSURE ENLIVE / ENSURE PLUS) liquid 237 mL  237 mL Oral BID BM Emokpae, Ejiroghene E, MD   237 mL at 11/09/23 1633   fentaNYL (SUBLIMAZE) injection 12.5 mcg  12.5 mcg Intravenous Q2H PRN Laural Benes, Clanford L, MD   12.5 mcg at 11/10/23 1610   haloperidol lactate (HALDOL) injection 1 mg  1 mg Intravenous Q6H PRN Laural Benes,  Clanford L, MD   1 mg at 11/10/23 0823   heparin injection 5,000 Units  5,000 Units Subcutaneous Q8H Emokpae, Ejiroghene E, MD   5,000 Units at 11/10/23 0514   hydrALAZINE (APRESOLINE) injection 10 mg  10 mg Intravenous Q4H PRN Johnson, Clanford L, MD   10 mg at 11/10/23 0542   insulin aspart (novoLOG) injection 0-5 Units  0-5 Units Subcutaneous QHS Emokpae, Ejiroghene E, MD   2 Units at 11/09/23 2147   insulin aspart (novoLOG) injection 0-9 Units  0-9 Units Subcutaneous TID WC Emokpae, Ejiroghene E, MD   2 Units at 11/10/23 0825   insulin glargine-yfgn (SEMGLEE) injection 10 Units  10 Units Subcutaneous Daily Laural Benes, Clanford L, MD   10 Units at 11/10/23 9604   linaclotide (LINZESS) capsule 290 mcg  290 mcg Oral QAC breakfast Emokpae, Ejiroghene E, MD   290 mcg at 11/10/23 0825   lisinopril (ZESTRIL) tablet 20 mg  20 mg Oral Daily Johnson, Clanford L, MD   20 mg at 11/10/23 0825   magic mouthwash w/lidocaine  10 mL Oral TID PRN Laural Benes, Clanford L, MD       metoprolol tartrate (LOPRESSOR) tablet 12.5 mg  12.5 mg Oral BID Johnson, Clanford L, MD   12.5 mg at 11/10/23 0827   ondansetron (ZOFRAN) tablet 4 mg  4 mg Oral Q6H PRN Emokpae, Ejiroghene E, MD       Or   ondansetron (ZOFRAN) injection 4 mg  4 mg Intravenous Q6H PRN Emokpae, Ejiroghene E, MD       oxyCODONE (Oxy IR/ROXICODONE) immediate release tablet 5 mg  5 mg Oral Q8H PRN Johnson, Clanford L, MD   5 mg at 11/10/23 0950   pantoprazole (PROTONIX) EC tablet 40 mg  40 mg Oral Daily Emokpae, Ejiroghene E, MD   40 mg at 11/10/23 0826   polyethylene glycol (MIRALAX / GLYCOLAX) packet 17 g  17 g Oral Daily PRN Johnson, Clanford L, MD       senna-docusate (Senokot-S) tablet 1 tablet  1 tablet Oral QHS Johnson, Clanford L, MD   1 tablet at 11/09/23 2139   sodium bicarbonate tablet 650 mg  650 mg Oral BID Johnson, Clanford L, MD   650 mg at 11/10/23 0826   topiramate (TOPAMAX) tablet 100 mg  100 mg Oral BID Laural Benes, Clanford L, MD   100 mg at  11/10/23 0825     Discharge Medications: Please see discharge summary for a list of discharge medications.  Relevant Imaging Results:  Relevant Lab Results:   Additional Information [id number]  Elliot Gault, LCSW

## 2023-11-10 NOTE — Plan of Care (Signed)
  Problem: Acute Rehab PT Goals(only PT should resolve) Goal: Pt Will Go Supine/Side To Sit Outcome: Progressing Flowsheets (Taken 11/10/2023 1222) Pt will go Supine/Side to Sit:  with minimal assist  with contact guard assist Goal: Patient Will Transfer Sit To/From Stand Outcome: Progressing Flowsheets (Taken 11/10/2023 1222) Patient will transfer sit to/from stand:  with contact guard assist  with minimal assist Goal: Pt Will Transfer Bed To Chair/Chair To Bed Outcome: Progressing Flowsheets (Taken 11/10/2023 1222) Pt will Transfer Bed to Chair/Chair to Bed:  with contact guard assist  with min assist Goal: Pt Will Ambulate Outcome: Progressing Flowsheets (Taken 11/10/2023 1222) Pt will Ambulate:  50 feet  with contact guard assist  with minimal assist  with rolling walker   12:23 PM, 11/10/23 Ocie Bob, MPT Physical Therapist with [facility] [phone] office [phone] mobile phone

## 2023-11-10 NOTE — Progress Notes (Signed)
Speech Language Pathology Treatment: Dysphagia  Patient Details Name: Brian Nielsen MRN: [id number] DOB: 1960-12-27 Today's Date: 11/10/2023 Time: 2025-4270 SLP Time Calculation (min) (ACUTE ONLY): 27 min  Assessment / Plan / Recommendation Clinical Impression  Pt seen for ongoing dysphagia intervention following clinical swallow evaluation on Tuesday. Pt less alert today, however nursing reports that Pt was up chair for several hours this morning. His skin and eyes with some yellowing and Pt very confused and perseverating ("I've got to go", "Let's do this tomorrow" said repeatedly despite redirection). He self presented cup sips of thin water and without SLP guidance, he consumed sequential sips and this resulted in strong coughing episode. When sips controlled by SLP via hand over hand or constant verbal cues to take one sip at a time, no coughing and vocal quality clear throughout. Pt noted to have nectars and thin at bedside (SLP changed him to thin on Tuesday) and his tray is supposed to be D2 with minced meats however corn on his tray and cut up pieces of salmon. Pt was given a bite of salmon mixed with mashed potato and he chewed excessively and pocketed on the left, which eventually had to be removed by SLP. Pt consumed nectar thick liquids and orange sherbet without incident (although limited intake of NTL). Pt predominantly requesting cold items. He has some ulcerations on the lateral aspects of his tongue posteriorly and along the soft palate (petechia). He grimaces with each swallow and reports "throat" pain. Will plan for MBSS tomorrow if Pt is alert and schedule allows. Continue diet as ordered.   HPI HPI: 62 y.o. male with medical history significant for HTN, DM, CKD3, head and neck cancer, anemia, suprapubic catheter.  Patient presented to the ED with complaints of a fall and confusion.   On my evaluation patient is awake alert but fidgety, able to answer simple questions, he remembers  falling but not give a lot of history.  Spouse and son who are at bedside.  Confusion started about a week ago, with patient saying things that did not make sense, they thought this was related to patient's pain medication - they are not sure exactly how much he was taking but they believe he was taking more than prescribed.  He fell about a week ago, and then fell again yesterday.  They report yesterday he was standing and weaving about the bed and fell.  Patient denies dizziness, denies chest pain no difficulty breathing, but he is having pain to his left side of his chest.  They report a chronic productive cough that is unchanged related to his throat cancer and treatment. Spouse reports some initial difficulty breathing today on EMS arrival, but this had resolved on arrival to the ED.   He is on ulcers in his mouth, but the report reports stable oral intake.  No vomiting.  He is constipated.  No fevers. Right tonsil HPV+ SCC. Started radiation 11/8. The total radiation dose will be 7000 cGy at 200 cGy/fraction for a total of 35 fractions, treated once a day. Chemotherapy administered concurrently. Plan for tx completion 12/20. BSE requested.      SLP Plan  Continue with current plan of care;MBS (Consider MBSS tomorrow if Pt is appropriate/alert)      Recommendations for follow up therapy are one component of a multi-disciplinary discharge planning process, led by the attending physician.  Recommendations may be updated based on patient status, additional functional criteria and insurance authorization.    Recommendations  Diet  recommendations: Dysphagia 1 (puree);Thin liquid Liquids provided via: Cup;No straw Medication Administration: Whole meds with puree Supervision: Staff to assist with self feeding;Full supervision/cueing for compensatory strategies Compensations: Slow rate;Small sips/bites;Clear throat intermittently Postural Changes and/or Swallow Maneuvers: Seated upright 90  degrees;Upright 30-60 min after meal                  Oral care BID;Staff/trained caregiver to provide oral care   Frequent or constant Supervision/Assistance Dysphagia, unspecified (R13.10)     Continue with current plan of care;MBS (Consider MBSS tomorrow if Pt is appropriate/alert)    Thank you,  Brian Nielsen, CCC-SLP [phone]  Brian Nielsen  11/10/2023, 2:28 PM

## 2023-11-11 ENCOUNTER — Inpatient Hospital Stay (HOSPITAL_COMMUNITY): Payer: BC Managed Care – PPO

## 2023-11-11 ENCOUNTER — Encounter (HOSPITAL_COMMUNITY): Payer: Self-pay | Admitting: Internal Medicine

## 2023-11-11 DIAGNOSIS — Z7189 Other specified counseling: Secondary | ICD-10-CM

## 2023-11-11 DIAGNOSIS — G9341 Metabolic encephalopathy: Secondary | ICD-10-CM | POA: Diagnosis not present

## 2023-11-11 DIAGNOSIS — N179 Acute kidney failure, unspecified: Secondary | ICD-10-CM | POA: Diagnosis not present

## 2023-11-11 DIAGNOSIS — I10 Essential (primary) hypertension: Secondary | ICD-10-CM | POA: Diagnosis not present

## 2023-11-11 DIAGNOSIS — C109 Malignant neoplasm of oropharynx, unspecified: Secondary | ICD-10-CM | POA: Diagnosis not present

## 2023-11-11 DIAGNOSIS — Z515 Encounter for palliative care: Secondary | ICD-10-CM

## 2023-11-11 LAB — CBC
HCT: 26.1 % — ABNORMAL LOW (ref 39.0–52.0)
Hemoglobin: 7.8 g/dL — ABNORMAL LOW (ref 13.0–17.0)
MCH: 28.5 pg (ref 26.0–34.0)
MCHC: 29.9 g/dL — ABNORMAL LOW (ref 30.0–36.0)
MCV: 95.3 fL (ref 80.0–100.0)
Platelets: 149 10*3/uL — ABNORMAL LOW (ref 150–400)
RBC: 2.74 MIL/uL — ABNORMAL LOW (ref 4.22–5.81)
RDW: 18.6 % — ABNORMAL HIGH (ref 11.5–15.5)
WBC: 4.7 10*3/uL (ref 4.0–10.5)
nRBC: 0 % (ref 0.0–0.2)

## 2023-11-11 LAB — GLUCOSE, CAPILLARY
Glucose-Capillary: 205 mg/dL — ABNORMAL HIGH (ref 70–99)
Glucose-Capillary: 250 mg/dL — ABNORMAL HIGH (ref 70–99)
Glucose-Capillary: 227 mg/dL — ABNORMAL HIGH (ref 70–99)
Glucose-Capillary: 277 mg/dL — ABNORMAL HIGH (ref 70–99)
Glucose-Capillary: 209 mg/dL — ABNORMAL HIGH (ref 70–99)
Glucose-Capillary: 322 mg/dL — ABNORMAL HIGH (ref 70–99)

## 2023-11-11 LAB — PHOSPHORUS: Phosphorus: 4.2 mg/dL (ref 2.5–4.6)

## 2023-11-11 LAB — BASIC METABOLIC PANEL
Anion gap: 8 (ref 5–15)
BUN: 57 mg/dL — ABNORMAL HIGH (ref 8–23)
CO2: 19 mmol/L — ABNORMAL LOW (ref 22–32)
Calcium: 8.3 mg/dL — ABNORMAL LOW (ref 8.9–10.3)
Chloride: 120 mmol/L — ABNORMAL HIGH (ref 98–111)
Creatinine, Ser: 2.25 mg/dL — ABNORMAL HIGH (ref 0.61–1.24)
GFR, Estimated: 32 mL/min — ABNORMAL LOW (ref >60)
Glucose, Bld: 272 mg/dL — ABNORMAL HIGH (ref 70–99)
Potassium: 3.3 mmol/L — ABNORMAL LOW (ref 3.5–5.1)
Sodium: 147 mmol/L — ABNORMAL HIGH (ref 135–145)

## 2023-11-11 LAB — MAGNESIUM: Magnesium: 2.6 mg/dL — ABNORMAL HIGH (ref 1.7–2.4)

## 2023-11-11 MED ORDER — AMLODIPINE BESYLATE 5 MG PO TABS
5.0000 mg | ORAL_TABLET | Freq: Every day | ORAL | Status: DC
  Administered 2023-11-11 – 2023-11-14 (×4): 5 mg via ORAL
  Filled 2023-11-11 (×4): qty 1

## 2023-11-11 MED ORDER — POTASSIUM CHLORIDE IN NACL 20-0.45 MEQ/L-% IV SOLN: INTRAVENOUS | Status: AC

## 2023-11-11 NOTE — Evaluation (Signed)
Modified Barium Swallow Study  Patient Details  Name: Brian Nielsen MRN: [id number] Date of Birth: 06-Feb-1961  Today's Date: 11/11/2023  Modified Barium Swallow completed.  Full report located under Chart Review in the Imaging Section.  History of Present Illness 62 y.o. male with medical history significant for HTN, DM, CKD3, head and neck cancer, anemia, suprapubic catheter.  Patient presented to the ED with complaints of a fall and confusion.   On my evaluation patient is awake alert but fidgety, able to answer simple questions, he remembers falling but not give a lot of history.  Spouse and son who are at bedside.  Confusion started about a week ago, with patient saying things that did not make sense, they thought this was related to patient's pain medication - they are not sure exactly how much he was taking but they believe he was taking more than prescribed.  He fell about a week ago, and then fell again yesterday.  They report yesterday he was standing and weaving about the bed and fell.  Patient denies dizziness, denies chest pain no difficulty breathing, but he is having pain to his left side of his chest.  They report a chronic productive cough that is unchanged related to his throat cancer and treatment. Spouse reports some initial difficulty breathing today on EMS arrival, but this had resolved on arrival to the ED.   He is on ulcers in his mouth, but the report reports stable oral intake.  No vomiting.  He is constipated.  No fevers. Right tonsil HPV+ SCC. Started radiation 11/8. The total radiation dose will be 7000 cGy at 200 cGy/fraction for a total of 35 fractions, treated once a day. Chemotherapy administered concurrently. Plan for tx completion 12/20. BSE complete and MBSS recommended   Clinical Impression Pt presents with moderate sensorimotor oropharyngeal dysphagia in the setting of post radiation therapy. Note Pt's mentation is currently altered and he is unable to follow  commands without max cues/support. He also requires min/mod support for feeding. Oral stage is characterized by slow disorganized oral stage followed by premature spillage of liquids a delayed swallowing trigger and occasional silent aspiration of thin liquids before the swallow. Mod amounts of aspiration observed with sequential straw sips of thin liquid which resulted in a reflexive hard cough that was somewhat effective in clearing aspirates. Cued cough for silent aspiration was effective X1 for clearing some aspirates from the airway but majority of the time Pt was unable to produce a cough or it was produced but ineffective for clearing aspirates. Note decreased pharyngeal stripping wave, decreased epiglottic deflection and decreased laryngeal vestibule closure. Nectar thick liquids resulted in increased pharyngeal residue in diffuse locations (valleculae, posterior wall, pyriforms, aryepiglotic folds); residue was only slightly cleared with repeat swallows. Note occasional flash penetration of NTL however, no aspiraiton of NTL was visualized. HTL, puree and regular textures were consumed with prolonged oral stage, delayed swallowing trigger and pharyngeal residue however note good laryngeal vestibule closure and airway protection with these textures. Recommend continue with D2/fine chop diet and downgrade liquids to NECTAR thick liquids. Presented barium tablet and Pt masticated tablet prior to swallowing; recommend crush meds and administer with puree. Recommend free water protocol: water only after oral care, in between meals. Recommend strategies re: alternate bites/sips, repeat dry swallows after each bite/sip as Pt is able. ST will continue to follow acutely and will benefit from ST f/u after d/c. Factors that may increase risk of adverse event in presence of aspiration (  [employer] 2021): Frail or deconditioned;Weak cough  Swallow Evaluation Recommendations Recommendations: PO diet PO Diet  Recommendation: Dysphagia 2 (Finely chopped);Mildly thick liquids (Level 2, nectar thick) Liquid Administration via: Cup;Straw Medication Administration: Whole meds with puree Supervision: Patient able to self-feed;Staff to assist with self-feeding;Full assist for feeding;Set-up assistance for safety;Full supervision/cueing for swallowing strategies Swallowing strategies  : Slow rate;Small bites/sips;Multiple dry swallows after each bite/sip Postural changes: Position pt fully upright for meals Oral care recommendations: Oral care BID (2x/day);Oral care before ice chips/water Caregiver Recommendations: Avoid jello, ice cream, thin soups, popsicles;Remove water pitcher     Brian Nielsen H. Romie Levee, CCC-SLP Speech Language Pathologist  Brian Nielsen 11/11/2023,2:48 PM
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
## 2023-11-11 NOTE — Progress Notes (Signed)
Patient is coughing after taking bites of food and drinking. Patient is also complaining of abdominal pain afterwards. MD Tat notified.

## 2023-11-11 NOTE — Progress Notes (Signed)
Patient still unable to safely wear CPAP. Unit at bedside if anything changes.

## 2023-11-11 NOTE — TOC Progression Note (Signed)
Transition of Care [facility]) - Progression Note    Patient Details  Name: Brian Nielsen MRN: [id number] Date of Birth: 1961-08-04  Transition of Care [facility]) CM/SW Contact  Elliot Gault, LCSW Phone Number: 11/11/2023, 11:19 AM  Clinical Narrative:     TOC following. Spoke with pt's wife this AM to update on SNF bed offers. At this time, [name] is requesting to take pt home with [facility] instead. She states she will get a BSC and tub bench at [facility]. She is in agreement with TOC setting up [facility] with whatever agency will accept his insurance.  Updated MD. HH arranged with [facility].   Will follow.  Expected Discharge Plan: Home w Home Health Services Barriers to Discharge: Continued Medical Work up  Expected Discharge Plan and Services In-house Referral: Clinical Social Work   Post Acute Care Choice: Home Health Living arrangements for the past 2 months: Single Family Home Expected Discharge Date: 11/08/23                         HH Arranged: PT, OT HH Agency: [facility] Date [facility] Agency Contacted: 11/11/23   Representative spoke with at [facility] Agency: Cindie   Social Determinants of Health (SDOH) Interventions SDOH Screenings   Food Insecurity: No Food Insecurity (11/06/2023)  Housing: Unknown (11/06/2023)  Transportation Needs: No Transportation Needs (11/06/2023)  Utilities: Not At Risk (11/06/2023)  Financial Resource Strain: Low Risk  (02/08/2023)   Received from [facility], [facility]  Physical Activity: Patient Declined (02/08/2023)   Received from [facility], [facility]  Social Connections: Socially Integrated (02/08/2023)   Received from [facility], [facility]  Stress: No Stress Concern Present (02/08/2023)   Received from [facility], [facility]  Tobacco Use: High Risk (11/06/2023)    Readmission Risk Interventions    11/07/2023   11:04 AM  Readmission Risk Prevention Plan  Transportation Screening Complete  PCP or Specialist Appt  within 3-5 Days Complete  HRI or Home Care Consult Complete  Social Work Consult for Recovery Care Planning/Counseling Complete  Palliative Care Screening Not Applicable  Medication Review Oceanographer) Complete

## 2023-11-11 NOTE — Plan of Care (Signed)

## 2023-11-11 NOTE — Progress Notes (Signed)
PROGRESS NOTE  Brian Nielsen:[id number] DOB: [birth year] DOA: 11/06/2023 PCP: Chyrl Civatte, FNP  Brief History:  62 y.o. male with medical history significant for HTN, DM, CKD3, head and neck cancer, anemia, suprapubic catheter.  Patient presented to the ED with complaints of a fall and confusion. On my evaluation patient is awake alert but fidgety, able to answer simple questions, he remembers falling but not give a lot of history.  Spouse and son who are at bedside.  Confusion started about a week ago, with patient saying things that did not make sense, they thought this was related to patient's pain medication - they are not sure exactly how much he was taking but they believe he was taking more than prescribed.  He fell about a week ago, and then fell again yesterday.  They report yesterday he was standing and weaving about the bed and fell.  Patient denies dizziness, denies chest pain no difficulty breathing, but he is having pain to his left side of his chest.  They report a chronic productive cough that is unchanged related to his throat cancer and treatment. Spouse reports some initial difficulty breathing today on EMS arrival, but this had resolved on arrival to the ED. He is on ulcers in his mouth, but the report reports stable oral intake.  No vomiting.  He is constipated.  No fevers.   ED Course: Blood pressure systolic down to 81/19 improved with fluid bolus currently low 90s.  O2 sats-documented at 88% on room air- per EDP- was an error. Patient was 98% on room on my air evaluation.  Creatinine elevated at 5.38.  Sodium 129.  Mildly elevated liver enzymes AST 198, ALT 183.  Lactic acid 2.1.  Pelvic X-ray negative for acute abnormality.  CT chest abdomen and pelvis-acute fractures-posterior left 10th and 11th ribs, small volume pleural fluid within left base-fluid measures water density.  No pneumothorax.  Multifocal subsegmental atelectasis, groundglass attenuation posterior  consolidative change left lung-large contusion or atelectasis.  IV ceftriaxone and azithromycin started.  1.5 L bolus given.   Assessment/Plan: Acute metabolic encephalopathy Multifactorial including  UTI, opiates, AKI, hypotension.  Presenting with falls, confusion.  CT shows pulmonary contusion.  Chronic indwelling suprapubic catheter with UA showing large leukocytes and many bacteria.  -Obtain head CT - negative for acute findings  -Obtain urine culture-->> klebsiella pneumonia -Hydrated with IV fluids -Limit opiates -Continue IV ceftriaxone   -Check procalcitonin>>7.44  -suprapubic catheter was obstructed and may have been contributed to confusion, relieved now and flowing freely  -telemonitoring for safety -haloperidol ordered to be used only for agitation -if persists would request a teleneurology consult -wife requests to limit or eliminate opioids if possible   Klebsiella UTI  - continue IV ceftriaxone daily  - follow up C&S results   Aspiration pneumonitis -continue ceftriaxone  Dysphagia -appreciate speech therapy follow up -follow MBSS   Acute fractures left ribs 10/11 - supportive measures ordered - pain management as needed  - incentive spirometry ordered    Hyponatremia - resolved  Mild hyponatremia sodium 129.  Baseline 137-139. -Hydrated with normal saline for brief time   Transaminitis AST 198, ALT 183.  Normal T. bili 0.4, ALP 73.  No abdominal pain.  CT abdomen and pelvis without focal liver abnormality, status postcholecystectomy no biliary dilatation.  -Trend for now likely from hypotension, LFTs trending down  -Hold Lipitor -Per Care Everywhere recent hepatitis B- 09/2023 nonreactive. - Check  Hep C RNA--neg - outpatient GI follow up with Dr. Servando Snare [facility] GI    Fall at home, initial encounter Likely secondary to encephalopathy, opiates, hypotension/dehydration.  CT chest shows left 10th and 11th rib fractures, suggest pulmonary contusion/atelectasis.   He reports a chronic cough and some dyspnea prior to arrival in the ED. Not hypoxic with O2 sats 98% on room air currently.  Afebrile.  WBC 5.  Lactic acid 2.1 - Most likely pulmonary contusion, IV ceftriaxone and azithromycin started in ED, continue for now with low threshold to discontinue -PT eval recommending CIR - placed order for CIR evaluation, CIR declined patient; TOC working on SNF   -fall precautions    Acute kidney injury superimposed on CKD stage 3b  -Cr-5.38 at the time of admission -secondary to sepsis and volume depletion -possibly secondary to suprapubic catheter obstruction which is now relieved and urine flowing freely  -baseline creatinine 1.8-2.1   Oropharynx cancer  Completed chemoradiation therapy.  Follows with [facility] oncology.   OSA (obstructive sleep apnea) Resume CPAP nightly   Diabetes mellitus with stage 3 chronic kidney disease  Controlled.  A1c 6.2. - SSi- S  Essential hypertension, benign Presented with hypotension but now having rebound high blood pressures. -d/c lisinopril in setting of AKI -Resumed clonidine 0.2 mg TID  -IV hydralazine PRN   BPH (benign prostatic hyperplasia) Chronic suprapubic catheter.  Last changed per spouse about 2 weeks ago.  Follows with urology.   DVT prophylaxis: sq heparin  Code Status: Full  Family Communication: wife updated bedside 12/24, telephone 12/26  Disposition: CIR recommended by PT    Consultants:    Procedures:    Antimicrobials:  CTX 12/22>> Azithromycin 12/22>> 12/25         Subjective: Pt complains of rib pain.  Denies cp, sob, n/v/d, abd pain  Objective: Vitals:   11/10/23 1959 11/11/23 0334 11/11/23 0648 11/11/23 0815  BP: (!) 147/72 (!) 161/77 (!) 165/70 (!) 174/78  Pulse: 60 62  74  Resp: 16 18    Temp: 98.5 F (36.9 C) (!) 97.4 F (36.3 C)    TempSrc:      SpO2: 99% 99%      Intake/Output Summary (Last 24 hours) at 11/11/2023 1235 Last data filed at 11/11/2023 0931 Gross  per 24 hour  Intake 480 ml  Output 1050 ml  Net -570 ml   Weight change:  Exam:  General:  Pt is alert, follows commands appropriately, not in acute distress HEENT: No icterus, No thrush, No neck mass, Henry/AT Cardiovascular: RRR, S1/S2, no rubs, no gallops Respiratory: bibasilar rales.  No wheeze Abdomen: Soft/+BS, non tender, non distended, no guarding Extremities: No edema, No lymphangitis, No petechiae, No rashes, no synovitis   Data Reviewed: I have personally reviewed following labs and imaging studies Basic Metabolic Panel: Recent Labs  Lab 11/06/23 1322 11/07/23 0427 11/08/23 0355 11/09/23 0427 11/11/23 0422  NA 129* 135 138 142 147*  K 5.6* 4.7 3.7 3.6 3.3*  CL 99 107 109 114* 120*  CO2 17* 20* 18* 20* 19*  GLUCOSE 123* 51* 87 173* 272*  BUN 85* 74* 64* 52* 57*  CREATININE 5.38* 4.41* 3.36* 2.50* 2.25*  CALCIUM 8.4* 8.0* 8.3* 8.6* 8.3*  MG  --   --   --   --  2.6*  PHOS  --   --  3.6  --  4.2   Liver Function Tests: Recent Labs  Lab 11/06/23 1322 11/07/23 0427 11/08/23 0354 11/08/23 0355  AST  198* 114* 65*  --   ALT 183* 139* 116*  --   ALKPHOS 73 63 77  --   BILITOT 0.4 0.5 0.3  --   PROT 5.7* 4.9* 5.2*  --   ALBUMIN 2.4* 1.9* 1.9* 1.9*   No results for input(s): "LIPASE", "AMYLASE" in the last 168 hours. No results for input(s): "AMMONIA" in the last 168 hours. Coagulation Profile: Recent Labs  Lab 11/06/23 1322  INR 1.3*   CBC: Recent Labs  Lab 11/06/23 1322 11/07/23 0427 11/11/23 0422  WBC 5.0 5.7 4.7  HGB 9.0* 8.0* 7.8*  HCT 28.1* 25.1* 26.1*  MCV 93.0 94.0 95.3  PLT 292 199 149*   Cardiac Enzymes: No results for input(s): "CKTOTAL", "CKMB", "CKMBINDEX", "TROPONINI" in the last 168 hours. BNP: Invalid input(s): "POCBNP" CBG: Recent Labs  Lab 11/10/23 1616 11/10/23 2003 11/11/23 0333 11/11/23 0733 11/11/23 1136  GLUCAP 163* 205* 250* 227* 277*   HbA1C: No results for input(s): "HGBA1C" in the last 72 hours. Urine  analysis:    Component Value Date/Time   COLORURINE AMBER (A) 11/06/2023 1322   APPEARANCEUR TURBID (A) 11/06/2023 1322   APPEARANCEUR Clear 12/14/2022 1529   LABSPEC 1.017 11/06/2023 1322   PHURINE 5.0 11/06/2023 1322   GLUCOSEU NEGATIVE 11/06/2023 1322   HGBUR SMALL (A) 11/06/2023 1322   BILIRUBINUR NEGATIVE 11/06/2023 1322   BILIRUBINUR Negative 12/14/2022 1529   KETONESUR NEGATIVE 11/06/2023 1322   PROTEINUR >=300 (A) 11/06/2023 1322   NITRITE NEGATIVE 11/06/2023 1322   LEUKOCYTESUR LARGE (A) 11/06/2023 1322   Sepsis Labs: @LABRCNTIP (procalcitonin:4,lacticidven:4) ) Recent Results (from the past 240 hours)  Urine Culture (for pregnant, neutropenic or urologic patients or patients with an indwelling urinary catheter)     Status: Abnormal   Collection Time: 11/06/23  1:22 PM   Specimen: Urine, Clean Catch  Result Value Ref Range Status   Specimen Description   Final    URINE, CLEAN CATCH Performed at [facility], [address], [place], [place] [address]    Special Requests   Final    NONE Performed at [facility], [address] [place], [place] [address]    Culture >=100,000 COLONIES/mL KLEBSIELLA PNEUMONIAE (A)  Final   Report Status 11/09/2023 FINAL  Final   Organism ID, Bacteria KLEBSIELLA PNEUMONIAE (A)  Final      Susceptibility   Klebsiella pneumoniae - MIC*    AMPICILLIN RESISTANT Resistant     CEFAZOLIN <=4 SENSITIVE Sensitive     CEFEPIME <=0.12 SENSITIVE Sensitive     CEFTRIAXONE <=0.25 SENSITIVE Sensitive     CIPROFLOXACIN <=0.25 SENSITIVE Sensitive     GENTAMICIN <=1 SENSITIVE Sensitive     IMIPENEM <=0.25 SENSITIVE Sensitive     NITROFURANTOIN 32 SENSITIVE Sensitive     TRIMETH/SULFA <=20 SENSITIVE Sensitive     AMPICILLIN/SULBACTAM 4 SENSITIVE Sensitive     PIP/TAZO <=4 SENSITIVE Sensitive ug/mL    * >=100,000 COLONIES/mL KLEBSIELLA PNEUMONIAE     Scheduled Meds:  (feeding supplement) PROSource Plus  30 mL Oral TID BM   amitriptyline  25 mg  Oral QHS   aspirin EC  81 mg Oral Daily   Chlorhexidine Gluconate Cloth  6 each Topical Daily   cloNIDine  0.2 mg Oral TID   DULoxetine  20 mg Oral Daily   feeding supplement  237 mL Oral TID BM   heparin  5,000 Units Subcutaneous Q8H   insulin aspart  0-5 Units Subcutaneous QHS   insulin aspart  0-9 Units Subcutaneous TID WC  insulin glargine-yfgn  10 Units Subcutaneous Daily   linaclotide  290 mcg Oral QAC breakfast   lisinopril  20 mg Oral Daily   metoprolol tartrate  12.5 mg Oral BID   multivitamin with minerals  1 tablet Oral Daily   pantoprazole  40 mg Oral Daily   senna-docusate  1 tablet Oral QHS   sodium bicarbonate  650 mg Oral BID   topiramate  100 mg Oral BID   Continuous Infusions:  cefTRIAXone (ROCEPHIN)  IV 2 g (11/10/23 1437)    Procedures/Studies: CT HEAD WO CONTRAST ( ) Result Date: 11/06/2023 CLINICAL DATA:  Patient fell 2 days ago. History of head neck cancer. Encephalopathy. EXAM: CT HEAD WITHOUT CONTRAST TECHNIQUE: Contiguous axial images were obtained from the base of the skull through the vertex without intravenous contrast. RADIATION DOSE REDUCTION: This exam was performed according to the departmental dose-optimization program which includes automated exposure control, adjustment of the mA and/or kV according to patient size and/or use of iterative reconstruction technique. COMPARISON:  05/12/2021 FINDINGS: Brain: There is no evidence for acute hemorrhage, hydrocephalus, mass lesion, or abnormal extra-axial fluid collection. No definite CT evidence for acute infarction. Vascular: No hyperdense vessel or unexpected calcification. Skull: No evidence for fracture. No worrisome lytic or sclerotic lesion. Sinuses/Orbits: The visualized paranasal sinuses and mastoid air cells are clear. Visualized portions of the globes and intraorbital fat are unremarkable. Other: None. IMPRESSION: No acute intracranial abnormality. Electronically Signed   By: Kennith Center M.D.    On: 11/06/2023 16:29   DG Pelvis Portable Result Date: 11/06/2023 CLINICAL DATA:  Fall. EXAM: PORTABLE PELVIS 1-2 VIEWS COMPARISON:  Same day CT chest, abdomen, and pelvis. FINDINGS: There is no evidence of pelvic fracture or diastasis. The sacroiliac joints and pubic symphysis appear anatomically aligned. No pelvic bone lesions are seen. IMPRESSION: No acute osseous abnormality identified on AP pelvic radiograph. Electronically Signed   By: Hart Robinsons M.D.   On: 11/06/2023 14:09   DG Chest Port 1 View Result Date: 11/06/2023 CLINICAL DATA:  Fall. EXAM: PORTABLE CHEST 1 VIEW COMPARISON:  Same day CT chest, abdomen, and pelvis. Chest radiograph dated February 03, 2023. FINDINGS: Low lung volumes with accentuation of the cardiomediastinal silhouette. Overlapping telemetry wires. Patchy opacities at the left lung base. Blunting of the left costophrenic angle. The right lung appears clear. No pneumothorax. Posterior left tenth and eleventh rib fractures are not well visualized on this exam and better evaluated on the same-day CT chest, abdomen, and pelvis. IMPRESSION: 1. Posterior left tenth and eleventh rib fractures are not well visualized on this exam and better evaluated on the same-day CT chest, abdomen, and pelvis. No pneumothorax. 2. Patchy opacities at the left lung base could reflect contusion, infiltrate, or atelectasis. 3. Probable trace left effusion. Electronically Signed   By: Hart Robinsons M.D.   On: 11/06/2023 14:06   CT CHEST ABDOMEN PELVIS WO CONTRAST Result Date: 11/06/2023 CLINICAL DATA:  Fall 2 days ago. Blunt trauma. New onset hypoxia. Rib pain. EXAM: CT CHEST, ABDOMEN AND PELVIS WITHOUT CONTRAST TECHNIQUE: Multidetector CT imaging of the chest, abdomen and pelvis was performed following the standard protocol without IV contrast. RADIATION DOSE REDUCTION: This exam was performed according to the departmental dose-optimization program which includes automated exposure control,  adjustment of the mA and/or kV according to patient size and/or use of iterative reconstruction technique. COMPARISON:  CT AP from 10/15/2022 FINDINGS: CT CHEST FINDINGS Cardiovascular: Heart size appears mildly enlarged. There is no pericardial effusion. Aortic atherosclerotic calcifications  and coronary artery calcifications. Mediastinum/Nodes: No enlarged mediastinal, hilar, or axillary lymph nodes. Thyroid gland, trachea, and esophagus demonstrate no significant findings. Lungs/Pleura: Small volume of pleural fluid is identified within the left base the fluid measures water density. Lingular atelectasis. In the left lower lobe there are multifocal areas of subsegmental atelectasis, ground-glass attenuation, and posterior consolidative change. No pneumothorax identified. Musculoskeletal: There are acute fractures involving the posterior aspect of the left tenth and eleventh ribs posteriorly, image 53/2 and image 58/2. The thoracic vertebral body heights are well preserved without signs of acute fracture. Intact sternum. CT ABDOMEN PELVIS FINDINGS Hepatobiliary: No focal liver abnormality is seen. Status post cholecystectomy. No biliary dilatation. Pancreas: Unremarkable. No pancreatic ductal dilatation or surrounding inflammatory changes. Spleen: No splenic injury or perisplenic hematoma. Adrenals/Urinary Tract: No adrenal hemorrhage or renal injury identified. The bladder is partially decompressed around a suprapubic catheter. Stomach/Bowel: Stomach is unremarkable. No pathologic dilatation of the large or small bowel loops. The appendix is visualized and is normal. There is a moderate to large stool burden identified within the ascending colon. No bowel wall thickening or inflammatory changes identified Vascular/Lymphatic: Aortic atherosclerosis. No enlarged abdominal or pelvic lymph nodes. Reproductive: Prostate is unremarkable. Other: No free fluid or fluid collections. Small fat containing umbilical hernia.  Musculoskeletal: No fracture is seen. IMPRESSION: 1. Acute fractures involving the posterior aspect of the left tenth and eleventh ribs posteriorly. 2. Small volume of pleural fluid is identified within the left base. The fluid measures water density. No pneumothorax identified. 3. Multifocal areas of subsegmental atelectasis, ground-glass attenuation, and posterior consolidative change within the left lower lobe. Findings may reflect pulmonary contusion and/or atelectasis. 4. No acute findings within the abdomen or pelvis. 5. Moderate to large stool burden identified within the ascending colon. Correlate for any clinical signs or symptoms of constipation. 6. Coronary artery calcifications. 7.  Aortic Atherosclerosis (ICD10-I70.0). Electronically Signed   By: Signa Kell M.D.   On: 11/06/2023 13:56    Catarina Hartshorn, DO  [facility]  If 7PM-7AM, please contact night-coverage [email or web address] Password [facility] 11/11/2023, 12:35 PM   LOS: 5 days

## 2023-11-12 DIAGNOSIS — Z9359 Other cystostomy status: Secondary | ICD-10-CM | POA: Diagnosis not present

## 2023-11-12 DIAGNOSIS — N390 Urinary tract infection, site not specified: Secondary | ICD-10-CM | POA: Insufficient documentation

## 2023-11-12 DIAGNOSIS — T83510D Infection and inflammatory reaction due to cystostomy catheter, subsequent encounter: Secondary | ICD-10-CM

## 2023-11-12 DIAGNOSIS — G9341 Metabolic encephalopathy: Secondary | ICD-10-CM | POA: Diagnosis not present

## 2023-11-12 DIAGNOSIS — N179 Acute kidney failure, unspecified: Secondary | ICD-10-CM | POA: Diagnosis not present

## 2023-11-12 DIAGNOSIS — T83511A Infection and inflammatory reaction due to indwelling urethral catheter, initial encounter: Secondary | ICD-10-CM | POA: Insufficient documentation

## 2023-11-12 LAB — COMPREHENSIVE METABOLIC PANEL
ALT: 40 U/L (ref 0–44)
AST: 13 U/L — ABNORMAL LOW (ref 15–41)
Albumin: 2 g/dL — ABNORMAL LOW (ref 3.5–5.0)
Alkaline Phosphatase: 82 U/L (ref 38–126)
Anion gap: 5 (ref 5–15)
BUN: 56 mg/dL — ABNORMAL HIGH (ref 8–23)
CO2: 20 mmol/L — ABNORMAL LOW (ref 22–32)
Calcium: 8.2 mg/dL — ABNORMAL LOW (ref 8.9–10.3)
Chloride: 124 mmol/L — ABNORMAL HIGH (ref 98–111)
Creatinine, Ser: 1.98 mg/dL — ABNORMAL HIGH (ref 0.61–1.24)
GFR, Estimated: 37 mL/min — ABNORMAL LOW (ref >60)
Glucose, Bld: 235 mg/dL — ABNORMAL HIGH (ref 70–99)
Potassium: 3.2 mmol/L — ABNORMAL LOW (ref 3.5–5.1)
Sodium: 149 mmol/L — ABNORMAL HIGH (ref 135–145)
Total Bilirubin: 0.7 mg/dL (ref <1.2)
Total Protein: 5.6 g/dL — ABNORMAL LOW (ref 6.5–8.1)

## 2023-11-12 LAB — CBC
HCT: 27.6 % — ABNORMAL LOW (ref 39.0–52.0)
Hemoglobin: 8.3 g/dL — ABNORMAL LOW (ref 13.0–17.0)
MCH: 29 pg (ref 26.0–34.0)
MCHC: 30.1 g/dL (ref 30.0–36.0)
MCV: 96.5 fL (ref 80.0–100.0)
Platelets: 171 10*3/uL (ref 150–400)
RBC: 2.86 MIL/uL — ABNORMAL LOW (ref 4.22–5.81)
RDW: 18.6 % — ABNORMAL HIGH (ref 11.5–15.5)
WBC: 4.9 10*3/uL (ref 4.0–10.5)
nRBC: 0 % (ref 0.0–0.2)

## 2023-11-12 LAB — GLUCOSE, CAPILLARY
Glucose-Capillary: 211 mg/dL — ABNORMAL HIGH (ref 70–99)
Glucose-Capillary: 231 mg/dL — ABNORMAL HIGH (ref 70–99)
Glucose-Capillary: 311 mg/dL — ABNORMAL HIGH (ref 70–99)
Glucose-Capillary: 205 mg/dL — ABNORMAL HIGH (ref 70–99)
Glucose-Capillary: 293 mg/dL — ABNORMAL HIGH (ref 70–99)

## 2023-11-12 MED ORDER — POTASSIUM CHLORIDE 10 MEQ/100ML IV SOLN
10.0000 meq | INTRAVENOUS | Status: AC
  Administered 2023-11-12 (×2): 10 meq via INTRAVENOUS
  Filled 2023-11-12 (×2): qty 100

## 2023-11-12 MED ORDER — POTASSIUM CHLORIDE 20 MEQ PO PACK
20.0000 meq | PACK | Freq: Once | ORAL | Status: AC
  Administered 2023-11-12: 20 meq via ORAL
  Filled 2023-11-12: qty 1

## 2023-11-12 MED ORDER — POTASSIUM CL IN DEXTROSE 5% 20 MEQ/L IV SOLN
20.0000 meq | INTRAVENOUS | Status: AC
Start: 2023-11-12 — End: 2023-11-13
  Administered 2023-11-12: 20 meq via INTRAVENOUS

## 2023-11-12 MED ORDER — INSULIN GLARGINE-YFGN 100 UNIT/ML ~~LOC~~ SOLN
12.0000 [IU] | Freq: Every day | SUBCUTANEOUS | Status: DC
  Administered 2023-11-13: 12 [IU] via SUBCUTANEOUS
  Filled 2023-11-12 (×2): qty 0.12

## 2023-11-12 NOTE — Progress Notes (Signed)
Called spouse and updated her at [address] [facility]--no capacity Called [facility]--no capacity  DTat

## 2023-11-12 NOTE — Progress Notes (Signed)
PROGRESS NOTE  Brian Nielsen WUJ:[id number] DOB: 06-Feb-1961 DOA: 11/06/2023 PCP: Chyrl Civatte, FNP  Brief History:  62 y.o. male with medical history significant for HTN, DM, CKD3, head and neck cancer, anemia, suprapubic catheter.  Patient presented to the ED with complaints of a fall and confusion. On my evaluation patient is awake alert but fidgety, able to answer simple questions, he remembers falling but not give a lot of history.  Spouse and son who are at bedside.  Confusion started about a week ago, with patient saying things that did not make sense, they thought this was related to patient's pain medication - they are not sure exactly how much he was taking but they believe he was taking more than prescribed.  He fell about a week ago, and then fell again yesterday.  They report yesterday he was standing and weaving about the bed and fell.  Patient denies dizziness, denies chest pain no difficulty breathing, but he is having pain to his left side of his chest.  They report a chronic productive cough that is unchanged related to his throat cancer and treatment. Spouse reports some initial difficulty breathing today on EMS arrival, but this had resolved on arrival to the ED. He is on ulcers in his mouth, but the report reports stable oral intake.  No vomiting.  He is constipated.  No fevers.   ED Course: Blood pressure systolic down to 95/62 improved with fluid bolus currently low 90s.  O2 sats-documented at 88% on room air- per EDP- was an error. Patient was 98% on room on my air evaluation.  Creatinine elevated at 5.38.  Sodium 129.  Mildly elevated liver enzymes AST 198, ALT 183.  Lactic acid 2.1.  Pelvic X-ray negative for acute abnormality.  CT chest abdomen and pelvis-acute fractures-posterior left 10th and 11th ribs, small volume pleural fluid within left base-fluid measures water density.  No pneumothorax.  Multifocal subsegmental atelectasis, groundglass attenuation posterior  consolidative change left lung-large contusion or atelectasis.  IV ceftriaxone and azithromycin started.  1.5 L bolus given.   Assessment/Plan: Acute metabolic encephalopathy Multifactorial including  UTI, opiates, AKI, hypotension.   -Presenting with falls, confusion.  CT shows pulmonary contusion.  -Chronic indwelling suprapubic catheter with UA>50 WBC -Obtain head CT - negative for acute findings  -Obtain urine culture-->> klebsiella pneumonia -Hydrated with IV fluids -Limit opiates -Continue IV ceftriaxone   -Check procalcitonin>>7.44  -suprapubic catheter was obstructed and may have been contributed to confusion, relieved now and flowing freely  -telemonitoring for safety -haloperidol ordered to be used only for agitation -if persists would request a teleneurology consult -wife requests to limit or eliminate opioids if possible -11/12/23--overall mental status gradually improving although he has waxing and waning episodes of agitation and somnolence   Klebsiella UTI  - continue IV ceftriaxone daily    Aspiration pneumonitis -continue ceftriaxone -stable on RA   Dysphagia -appreciate speech therapy follow up -MBSS>>dysphagia 2 with NTL -he continues to have very po intake, much driven by pain -pt and spouse request transfer to [facility] or [facility] -I called [facility] 12/27 and 12/28--no capacity presently -I called [facility]--awaiting call back  Opioid dependence -PDMP reviewed -oxycodone 5 mg, #80, last refill 10/27/23, 10/05/23 -tramadol 50 mg, #12, filled 11/02/23 -Xtampza 9 mg, #60, filled 10/10/23   Acute fractures left ribs 10/11 - supportive measures ordered - pain management as needed  - incentive spirometry ordered    Hyponatremia>>>hypernatremia Mild  hyponatremia sodium 129.  Baseline 137-139. -Hydrated with normal saline for brief time -12/28--start D5W   Transaminitis AST 198, ALT 183.  Normal T. bili 0.4, ALP 73.   -No abdominal pain.  CT abdomen and  pelvis without focal liver abnormality, status postcholecystectomy no biliary dilatation.  -Trend for now likely from initial hypotension,  -LFTs trending down>>improved now -Hold Lipitor -Per Care Everywhere recent hepatitis B- 09/2023 nonreactive. - Check Hep C RNA--neg - outpatient GI follow up with Dr. Servando Snare [facility] GI    Fall at home, initial encounter Likely secondary to encephalopathy, opiates, hypotension/dehydration.  CT chest shows left 10th and 11th rib fractures, suggest pulmonary contusion/atelectasis.  He reports a chronic cough and some dyspnea prior to arrival in the ED. Not hypoxic with O2 sats 98% on room air currently.  Afebrile.  WBC 5.  Lactic acid 2.1 - Most likely pulmonary contusion, IV ceftriaxone and azithromycin started in ED, continue for now with low threshold to discontinue -PT eval recommending CIR - placed order for CIR evaluation, CIR declined patient; TOC working on SNF   -fall precautions    Acute kidney injury superimposed on CKD stage 3b  -Cr-5.38 at the time of admission -secondary to sepsis and volume depletion -possibly secondary to suprapubic catheter obstruction which is now relieved and urine flowing freely  -baseline creatinine 1.8-2.1   Oropharynx cancer  Completed chemoradiation therapy.  Follows with [facility] oncology.   OSA (obstructive sleep apnea) Resume CPAP nightly   Diabetes mellitus with stage 3 chronic kidney disease  -repeat A1C -increase semglee to 12 units - SSi- S   Essential hypertension, benign Presented with hypotension but now having rebound high blood pressures. -d/c lisinopril in setting of AKI -Resumed clonidine 0.2 mg TID  -start amlodipine 5 mg daily -IV hydralazine PRN   BPH (benign prostatic hyperplasia) Chronic suprapubic catheter.  Last changed per spouse about 2 weeks ago.  Follows with urology.   DVT prophylaxis: sq heparin  Code Status: Full  Family Communication: wife updated bedside 12/24, telephone 12/26   Disposition: CIR recommended by PT    Consultants: palliative   Procedures:    Antimicrobials:  CTX 12/22>> Azithromycin 12/22>> 12/25           Total time spent 50 minutes.  Greater than 50% spent face to face counseling and coordinating care.     Subjective: Denies f/c, sob, n/v/d.  Has left side rib pain  Objective: Vitals:   11/11/23 2008 11/11/23 2033 11/11/23 2052 11/12/23 0259  BP:  (!) 166/77 (!) 166/77 (!) 180/80  Pulse:  70 70 67  Resp: 14   17  Temp:  97.7 F (36.5 C)  99 F (37.2 C)  TempSrc:      SpO2:  99%  98%    Intake/Output Summary (Last 24 hours) at 11/12/2023 1011 Last data filed at 11/12/2023 0900 Gross per 24 hour  Intake 984.71 ml  Output 2600 ml  Net -1615.29 ml   Weight change:  Exam:  General:  Pt is alert, follows commands appropriately, not in acute distress HEENT: No icterus, No thrush, No neck mass, [place]/AT Cardiovascular: RRR, S1/S2, no rubs, no gallops Respiratory: poor inspiratory effort.  No wheeze Abdomen: Soft/+BS, non tender, non distended, no guarding Extremities: No edema, No lymphangitis, No petechiae, No rashes, no synovitis   Data Reviewed: I have personally reviewed following labs and imaging studies Basic Metabolic Panel: Recent Labs  Lab 11/07/23 0427 11/08/23 0355 11/09/23 0427 11/11/23 0422 11/12/23 0449  NA 135 138 142 147* 149*  K 4.7 3.7 3.6 3.3* 3.2*  CL 107 109 114* 120* 124*  CO2 20* 18* 20* 19* 20*  GLUCOSE 51* 87 173* 272* 235*  BUN 74* 64* 52* 57* 56*  CREATININE 4.41* 3.36* 2.50* 2.25* 1.98*  CALCIUM 8.0* 8.3* 8.6* 8.3* 8.2*  MG  --   --   --  2.6*  --   PHOS  --  3.6  --  4.2  --    Liver Function Tests: Recent Labs  Lab 11/06/23 1322 11/07/23 0427 11/08/23 0354 11/08/23 0355 11/12/23 0449  AST 198* 114* 65*  --  13*  ALT 183* 139* 116*  --  40  ALKPHOS 73 63 77  --  82  BILITOT 0.4 0.5 0.3  --  0.7  PROT 5.7* 4.9* 5.2*  --  5.6*  ALBUMIN 2.4* 1.9* 1.9* 1.9* 2.0*   No  results for input(s): "LIPASE", "AMYLASE" in the last 168 hours. No results for input(s): "AMMONIA" in the last 168 hours. Coagulation Profile: Recent Labs  Lab 11/06/23 1322  INR 1.3*   CBC: Recent Labs  Lab 11/06/23 1322 11/07/23 0427 11/11/23 0422 11/12/23 0449  WBC 5.0 5.7 4.7 4.9  HGB 9.0* 8.0* 7.8* 8.3*  HCT 28.1* 25.1* 26.1* 27.6*  MCV 93.0 94.0 95.3 96.5  PLT 292 199 149* 171   Cardiac Enzymes: No results for input(s): "CKTOTAL", "CKMB", "CKMBINDEX", "TROPONINI" in the last 168 hours. BNP: Invalid input(s): "POCBNP" CBG: Recent Labs  Lab 11/11/23 1136 11/11/23 1648 11/11/23 2037 11/12/23 0253 11/12/23 0721  GLUCAP 277* 209* 322* 211* 231*   HbA1C: No results for input(s): "HGBA1C" in the last 72 hours. Urine analysis:    Component Value Date/Time   COLORURINE AMBER (A) 11/06/2023 1322   APPEARANCEUR TURBID (A) 11/06/2023 1322   APPEARANCEUR Clear 12/14/2022 1529   LABSPEC 1.017 11/06/2023 1322   PHURINE 5.0 11/06/2023 1322   GLUCOSEU NEGATIVE 11/06/2023 1322   HGBUR SMALL (A) 11/06/2023 1322   BILIRUBINUR NEGATIVE 11/06/2023 1322   BILIRUBINUR Negative 12/14/2022 1529   KETONESUR NEGATIVE 11/06/2023 1322   PROTEINUR >=300 (A) 11/06/2023 1322   NITRITE NEGATIVE 11/06/2023 1322   LEUKOCYTESUR LARGE (A) 11/06/2023 1322   Sepsis Labs: @LABRCNTIP (procalcitonin:4,lacticidven:4) ) Recent Results (from the past 240 hours)  Urine Culture (for pregnant, neutropenic or urologic patients or patients with an indwelling urinary catheter)     Status: Abnormal   Collection Time: 11/06/23  1:22 PM   Specimen: Urine, Clean Catch  Result Value Ref Range Status   Specimen Description   Final    URINE, CLEAN CATCH Performed at [facility], [address] [place], [place] [address]    Special Requests   Final    NONE Performed at [facility], [address], [place], [place] [address]    Culture >=100,000 COLONIES/mL KLEBSIELLA PNEUMONIAE (A)  Final   Report  Status 11/09/2023 FINAL  Final   Organism ID, Bacteria KLEBSIELLA PNEUMONIAE (A)  Final      Susceptibility   Klebsiella pneumoniae - MIC*    AMPICILLIN RESISTANT Resistant     CEFAZOLIN <=4 SENSITIVE Sensitive     CEFEPIME <=0.12 SENSITIVE Sensitive     CEFTRIAXONE <=0.25 SENSITIVE Sensitive     CIPROFLOXACIN <=0.25 SENSITIVE Sensitive     GENTAMICIN <=1 SENSITIVE Sensitive     IMIPENEM <=0.25 SENSITIVE Sensitive     NITROFURANTOIN 32 SENSITIVE Sensitive     TRIMETH/SULFA <=20 SENSITIVE Sensitive     AMPICILLIN/SULBACTAM  4 SENSITIVE Sensitive     PIP/TAZO <=4 SENSITIVE Sensitive ug/mL    * >=100,000 COLONIES/mL KLEBSIELLA PNEUMONIAE     Scheduled Meds:  (feeding supplement) PROSource Plus  30 mL Oral TID BM   amitriptyline  25 mg Oral QHS   amLODipine  5 mg Oral Daily   aspirin EC  81 mg Oral Daily   Chlorhexidine Gluconate Cloth  6 each Topical Daily   cloNIDine  0.2 mg Oral TID   DULoxetine  20 mg Oral Daily   feeding supplement  237 mL Oral TID BM   heparin  5,000 Units Subcutaneous Q8H   insulin aspart  0-5 Units Subcutaneous QHS   insulin aspart  0-9 Units Subcutaneous TID WC   insulin glargine-yfgn  10 Units Subcutaneous Daily   linaclotide  290 mcg Oral QAC breakfast   metoprolol tartrate  12.5 mg Oral BID   multivitamin with minerals  1 tablet Oral Daily   pantoprazole  40 mg Oral Daily   potassium chloride  20 mEq Oral Once   senna-docusate  1 tablet Oral QHS   topiramate  100 mg Oral BID   Continuous Infusions:  dextrose 5 % with KCl 20 mEq / L 20 mEq (11/12/23 1006)   potassium chloride      Procedures/Studies: DG Swallowing Func-Speech Pathology Result Date: 11/11/2023 Table formatting from the original result was not included. Modified Barium Swallow Study Patient Details Name: FABIAN ROS MRN: [id number] Date of Birth: Apr 22, 1961 Today's Date: 11/11/2023 HPI/PMH: HPI: 62 y.o. male with medical history significant for HTN, DM, CKD3, head and neck cancer,  anemia, suprapubic catheter.  Patient presented to the ED with complaints of a fall and confusion.   On my evaluation patient is awake alert but fidgety, able to answer simple questions, he remembers falling but not give a lot of history.  Spouse and son who are at bedside.  Confusion started about a week ago, with patient saying things that did not make sense, they thought this was related to patient's pain medication - they are not sure exactly how much he was taking but they believe he was taking more than prescribed.  He fell about a week ago, and then fell again yesterday.  They report yesterday he was standing and weaving about the bed and fell.  Patient denies dizziness, denies chest pain no difficulty breathing, but he is having pain to his left side of his chest.  They report a chronic productive cough that is unchanged related to his throat cancer and treatment. Spouse reports some initial difficulty breathing today on EMS arrival, but this had resolved on arrival to the ED.   He is on ulcers in his mouth, but the report reports stable oral intake.  No vomiting.  He is constipated.  No fevers. Right tonsil HPV+ SCC. Started radiation 11/8. The total radiation dose will be 7000 cGy at 200 cGy/fraction for a total of 35 fractions, treated once a day. Chemotherapy administered concurrently. Plan for tx completion 12/20. BSE complete and MBSS recommended Clinical Impression: Clinical Impression: Pt presents with moderate sensorimotor oropharyngeal dysphagia in the setting of post radiation therapy. Note Pt's mentation is currently altered and he is unable to follow commands without max cues/support. He also requires min/mod support for feeding. Oral stage is characterized by slow disorganized oral stage followed by premature spillage of liquids a delayed swallowing trigger and occasional silent aspiration of thin liquids before the swallow. Mod amounts of aspiration observed with sequential straw  sips of thin  liquid which resulted in a reflexive hard cough that was somewhat effective in clearing aspirates. Cued cough for silent aspiration was effective X1 for clearing some aspirates from the airway but majority of the time Pt was unable to produce a cough or it was produced but ineffective for clearing aspirates. Note decreased pharyngeal stripping wave, decreased epiglottic deflection and decreased laryngeal vestibule closure. Nectar thick liquids resulted in increased pharyngeal residue in diffuse locations (valleculae, posterior wall, pyriforms, aryepiglotic folds); residue was only slightly cleared with repeat swallows. Note occasional flash penetration of NTL however, no aspiraiton of NTL was visualized. HTL, puree and regular textures were consumed with prolonged oral stage, delayed swallowing trigger and pharyngeal residue however note good laryngeal vestibule closure and airway protection with these textures. Recommend continue with D2/fine chop diet and downgrade liquids to NECTAR thick liquids. Presented barium tablet and Pt masticated tablet prior to swallowing; recommend crush meds and administer with puree. Recommend free water protocol: water only after oral care, in between meals. Recommend strategies re: alternate bites/sips, repeat dry swallows after each bite/sip as Pt is able. ST will continue to follow acutely and will benefit from ST f/u after d/c. Factors that may increase risk of adverse event in presence of aspiration Rubye Oaks & Clearance Coots 2021): Factors that may increase risk of adverse event in presence of aspiration Rubye Oaks & Clearance Coots 2021): Frail or deconditioned; Weak cough Recommendations/Plan: Swallowing Evaluation Recommendations Swallowing Evaluation Recommendations Recommendations: PO diet PO Diet Recommendation: Dysphagia 2 (Finely chopped); Mildly thick liquids (Level 2, nectar thick) Liquid Administration via: Cup; Straw Medication Administration: Whole meds with puree Supervision: Patient  able to self-feed; Staff to assist with self-feeding; Full assist for feeding; Set-up assistance for safety; Full supervision/cueing for swallowing strategies Swallowing strategies  : Slow rate; Small bites/sips; Multiple dry swallows after each bite/sip Postural changes: Position pt fully upright for meals Oral care recommendations: Oral care BID (2x/day); Oral care before ice chips/water Caregiver Recommendations: Avoid jello, ice cream, thin soups, popsicles; Remove water pitcher Treatment Plan Treatment Plan Treatment recommendations: Therapy as outlined in treatment plan below Follow-up recommendations: Outpatient SLP Functional status assessment: Patient has had a recent decline in their functional status and demonstrates the ability to make significant improvements in function in a reasonable and predictable amount of time. Treatment frequency: Min 2x/week Treatment duration: 1 week Interventions: Aspiration precaution training; Oropharyngeal exercises; Compensatory techniques; Patient/family education; Trials of upgraded texture/liquids; Diet toleration management by SLP; Respiratory muscle strength training Recommendations Recommendations for follow up therapy are one component of a multi-disciplinary discharge planning process, led by the attending physician.  Recommendations may be updated based on patient status, additional functional criteria and insurance authorization. Assessment: Orofacial Exam: Orofacial Exam Oral Cavity: Oral Hygiene: Xerostomia Oral Cavity - Dentition: Adequate natural dentition; Poor condition Anatomy: Anatomy: WFL Boluses Administered: Boluses Administered Boluses Administered: Thin liquids (Level 0); Mildly thick liquids (Level 2, nectar thick); Moderately thick liquids (Level 3, honey thick); Puree; Solid  Oral Impairment Domain: Oral Impairment Domain Lip Closure: Interlabial escape, no progression to anterior lip Tongue control during bolus hold: Escape to lateral buccal  cavity/floor of mouth Bolus preparation/mastication: Slow prolonged chewing/mashing with complete recollection; Disorganized chewing/mashing with solid pieces of bolus unchewed Bolus transport/lingual motion: Slow tongue motion; Repetitive/disorganized tongue motion Oral residue: Residue collection on oral structures Location of oral residue : Floor of mouth; Tongue; Palate Initiation of pharyngeal swallow : Pyriform sinuses; Posterior laryngeal surface of the epiglottis; Valleculae; Posterior angle of the ramus  Pharyngeal Impairment Domain: Pharyngeal Impairment Domain Soft palate elevation: No bolus between soft palate (SP)/pharyngeal wall (PW) Laryngeal elevation: Partial superior movement of thyroid cartilage/partial approximation of arytenoids to epiglottic petiole Anterior hyoid excursion: Partial anterior movement Epiglottic movement: Partial inversion Laryngeal vestibule closure: Incomplete, narrow column air/contrast in laryngeal vestibule Pharyngeal stripping wave : Present - diminished Pharyngeal contraction (A/P view only): N/A Pharyngoesophageal segment opening: Partial distention/partial duration, partial obstruction of flow Tongue base retraction: Trace column of contrast or air between tongue base and PPW Pharyngeal residue: Collection of residue within or on pharyngeal structures Location of pharyngeal residue: Valleculae; Pharyngeal wall; Aryepiglottic folds; Pyriform sinuses; Diffuse (>3 areas)  Esophageal Impairment Domain: No data recorded Pill: Pill Consistency administered: Thin liquids (Level 0) Penetration/Aspiration Scale Score: Penetration/Aspiration Scale Score 5.  Material enters airway, CONTACTS cords and not ejected out: Thin liquids (Level 0) 6.  Material enters airway, passes BELOW cords then ejected out: Thin liquids (Level 0) 7.  Material enters airway, passes BELOW cords and not ejected out despite cough attempt by patient: Thin liquids (Level 0) 8.  Material enters airway,  passes BELOW cords without attempt by patient to eject out (silent aspiration) : Thin liquids (Level 0) Compensatory Strategies: Compensatory Strategies Compensatory strategies: No   General Information: Caregiver present: No  Diet Prior to this Study: Dysphagia 2 (finely chopped); Thin liquids (Level 0)   Temperature : Normal   Respiratory Status: WFL   No data recorded  No data recorded Behavior/Cognition: Alert; Cooperative; Pleasant mood; Confused; Requires cueing; Doesn't follow directions Self-Feeding Abilities: Able to self-feed; Needs set-up for self-feeding; Needs assist with self-feeding Baseline vocal quality/speech: Hypophonia/low volume No data recorded Volitional Swallow: Able to elicit No data recorded Goal Planning: Prognosis for improved oropharyngeal function: Fair Barriers to Reach Goals: Severity of deficits No data recorded Patient/Family Stated Goal: N/A No data recorded Pain: Pain Assessment Pain Assessment: Faces Faces Pain Scale: 4 Breathing: 0 Negative Vocalization: 1 Facial Expression: 2 Body Language: 1 Consolability: 1 PAINAD Score: 5 Pain Location: "throat" Pain Descriptors / Indicators: Grimacing; Guarding Pain Intervention(s): Limited activity within patient's tolerance; Monitored during session End of Session: Start Time:SLP Start Time (ACUTE ONLY): 1156 Stop Time: SLP Stop Time (ACUTE ONLY): 1223 Time Calculation:SLP Time Calculation (min) (ACUTE ONLY): 27 min Charges: SLP Evaluations $ SLP Speech Visit: 1 Visit SLP Evaluations $MBS Swallow: 1 Procedure $Swallowing Treatment: 1 Procedure SLP visit diagnosis: SLP Visit Diagnosis: Dysphagia, unspecified (R13.10) Past Medical History: Past Medical History: Diagnosis Date  Adult celiac disease 2015  followed by dr Servando Snare (GI)  Anemia associated with chronic renal failure   hematologist--- dr Sarajane Jews;  treated with iron infusions  Benign localized prostatic hyperplasia with lower urinary tract symptoms (LUTS)   urologist--- dr Retta Diones   Cancer [facility]) 08/18/2023  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  Charcot foot due to diabetes mellitus (HCC)   Chronic constipation   CKD (chronic kidney disease), stage III [facility])   nephrologist--- dr Wolfgang Phoenix;  renal bx 11-20-2021  Diverticulosis of colon   Edema of both lower extremities   GERD (gastroesophageal reflux disease)   History of adenomatous polyp of colon   HTN (hypertension)   Hyperlipidemia, mixed   Peripheral neuropathy   Peyronie's disease   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 meatal stricture   Retinopathy due to secondary diabetes mellitus (HCC)   both eyes  treated with injecitons  Thoracic spondylosis without myelopathy 11/22/2016  Type 2 diabetes mellitus [facility])   endocrinologist--- dr nida  Vitamin D deficiency  Wears hearing aid in both ears  Past Surgical History: Past Surgical History: Procedure Laterality Date  BIOPSY N/A 09/07/2021  Procedure: BIOPSY;  Surgeon: Midge Minium, MD;  Location: [facility];  Service: Endoscopy;  Laterality: N/A;  CATARACT EXTRACTION W/ INTRAOCULAR LENS IMPLANT Bilateral 2023  COLONOSCOPY N/A 05/03/2014  Procedure: COLONOSCOPY;  Surgeon: West Bali, MD;  Location: [facility];  Service: Endoscopy;  Laterality: N/A;  12:00  COLONOSCOPY WITH PROPOFOL N/A 09/07/2021  Procedure: COLONOSCOPY WITH PROPOFOL;  Surgeon: Midge Minium, MD;  Location: [facility];  Service: Endoscopy;  Laterality: N/A;  CYSTOSCOPY WITH INJECTION N/A 07/26/2023  Procedure: CYSTOSCOPY WITH BOTOX INJECTION 100 UNITS;  Surgeon: Noel Christmas, MD;  Location: [facility];  Service: Urology;  Laterality: N/A;  30 minutes  CYSTOSCOPY WITH URETHRAL DILATATION N/A 01/10/2023  Procedure: CYSTOSCOPY;  Surgeon: Marcine Matar, MD;  Location: [facility];  Service: Urology;  Laterality: N/A;  ESOPHAGOGASTRODUODENOSCOPY N/A 05/03/2014  Procedure: ESOPHAGOGASTRODUODENOSCOPY (EGD);  Surgeon: West Bali, MD;  Location: [facility];  Service: Endoscopy;   Laterality: N/A;  ESOPHAGOGASTRODUODENOSCOPY (EGD) WITH PROPOFOL N/A 06/03/2020  Procedure: ESOPHAGOGASTRODUODENOSCOPY (EGD) WITH PROPOFOL;  Surgeon: Regis Bill, MD;  Location: [facility];  Service: Endoscopy;  Laterality: N/A;  ESOPHAGOGASTRODUODENOSCOPY (EGD) WITH PROPOFOL N/A 09/07/2021  Procedure: ESOPHAGOGASTRODUODENOSCOPY (EGD) WITH PROPOFOL;  Surgeon: Midge Minium, MD;  Location: [facility];  Service: Endoscopy;  Laterality: N/A;  Diabetic  IR BONE MARROW BIOPSY & ASPIRATION  04/27/2023  MEATOTOMY N/A 01/10/2023  Procedure: MEATOTOMY ADULT;  Surgeon: Marcine Matar, MD;  Location: [facility];  Service: Urology;  Laterality: N/A;  PARS PLANA VITRECTOMY Right 03/21/2021  Procedure: PARS PLANA VITRECTOMY 25 GAUGE WITH INJECTION OF ANTIBIOTICS FOR ENDOPHTHALMITIS;  Surgeon: Carmela Rima, MD;  Location: [facility] OR;  Service: Ophthalmology;  Laterality: Right;  POLYPECTOMY N/A 09/07/2021  Procedure: POLYPECTOMY;  Surgeon: Midge Minium, MD;  Location: [facility];  Service: Endoscopy;  Laterality: N/A;  REPAIR EXTENSOR TENDON Right 08/11/2022  Procedure: EXTENSOR CARPI ULNARIS TENDON DEBRIDEMENT, POSSIBLE SUBSHEATH RECONSTRUCTION;  Surgeon: Marlyne Beards, MD;  Location: [facility];  Service: Orthopedics;  Laterality: Right;  or regional plus MAC Amelia H. Romie Levee, CCC-SLP Speech Language Pathologist Georgetta Haber 11/11/2023, 2:52 PM  CT HEAD WO CONTRAST ( ) Result Date: 11/06/2023 CLINICAL DATA:  Patient fell 2 days ago. History of head neck cancer. Encephalopathy. EXAM: CT HEAD WITHOUT CONTRAST TECHNIQUE: Contiguous axial images were obtained from the base of the skull through the vertex without intravenous contrast. RADIATION DOSE REDUCTION: This exam was performed according to the departmental dose-optimization program which includes automated exposure control, adjustment of the mA and/or kV according to patient size and/or use of  iterative reconstruction technique. COMPARISON:  05/12/2021 FINDINGS: Brain: There is no evidence for acute hemorrhage, hydrocephalus, mass lesion, or abnormal extra-axial fluid collection. No definite CT evidence for acute infarction. Vascular: No hyperdense vessel or unexpected calcification. Skull: No evidence for fracture. No worrisome lytic or sclerotic lesion. Sinuses/Orbits: The visualized paranasal sinuses and mastoid air cells are clear. Visualized portions of the globes and intraorbital fat are unremarkable. Other: None. IMPRESSION: No acute intracranial abnormality. Electronically Signed   By: Kennith Center M.D.   On: 11/06/2023 16:29   DG Pelvis Portable Result Date: 11/06/2023 CLINICAL DATA:  Fall. EXAM: PORTABLE PELVIS 1-2 VIEWS COMPARISON:  Same day CT chest, abdomen, and pelvis. FINDINGS: There is no evidence of pelvic fracture or diastasis. The sacroiliac joints and pubic symphysis  appear anatomically aligned. No pelvic bone lesions are seen. IMPRESSION: No acute osseous abnormality identified on AP pelvic radiograph. Electronically Signed   By: Hart Robinsons M.D.   On: 11/06/2023 14:09   DG Chest Port 1 View Result Date: 11/06/2023 CLINICAL DATA:  Fall. EXAM: PORTABLE CHEST 1 VIEW COMPARISON:  Same day CT chest, abdomen, and pelvis. Chest radiograph dated February 03, 2023. FINDINGS: Low lung volumes with accentuation of the cardiomediastinal silhouette. Overlapping telemetry wires. Patchy opacities at the left lung base. Blunting of the left costophrenic angle. The right lung appears clear. No pneumothorax. Posterior left tenth and eleventh rib fractures are not well visualized on this exam and better evaluated on the same-day CT chest, abdomen, and pelvis. IMPRESSION: 1. Posterior left tenth and eleventh rib fractures are not well visualized on this exam and better evaluated on the same-day CT chest, abdomen, and pelvis. No pneumothorax. 2. Patchy opacities at the left lung base could  reflect contusion, infiltrate, or atelectasis. 3. Probable trace left effusion. Electronically Signed   By: Hart Robinsons M.D.   On: 11/06/2023 14:06   CT CHEST ABDOMEN PELVIS WO CONTRAST Result Date: 11/06/2023 CLINICAL DATA:  Fall 2 days ago. Blunt trauma. New onset hypoxia. Rib pain. EXAM: CT CHEST, ABDOMEN AND PELVIS WITHOUT CONTRAST TECHNIQUE: Multidetector CT imaging of the chest, abdomen and pelvis was performed following the standard protocol without IV contrast. RADIATION DOSE REDUCTION: This exam was performed according to the departmental dose-optimization program which includes automated exposure control, adjustment of the mA and/or kV according to patient size and/or use of iterative reconstruction technique. COMPARISON:  CT AP from 10/15/2022 FINDINGS: CT CHEST FINDINGS Cardiovascular: Heart size appears mildly enlarged. There is no pericardial effusion. Aortic atherosclerotic calcifications and coronary artery calcifications. Mediastinum/Nodes: No enlarged mediastinal, hilar, or axillary lymph nodes. Thyroid gland, trachea, and esophagus demonstrate no significant findings. Lungs/Pleura: Small volume of pleural fluid is identified within the left base the fluid measures water density. Lingular atelectasis. In the left lower lobe there are multifocal areas of subsegmental atelectasis, ground-glass attenuation, and posterior consolidative change. No pneumothorax identified. Musculoskeletal: There are acute fractures involving the posterior aspect of the left tenth and eleventh ribs posteriorly, image 53/2 and image 58/2. The thoracic vertebral body heights are well preserved without signs of acute fracture. Intact sternum. CT ABDOMEN PELVIS FINDINGS Hepatobiliary: No focal liver abnormality is seen. Status post cholecystectomy. No biliary dilatation. Pancreas: Unremarkable. No pancreatic ductal dilatation or surrounding inflammatory changes. Spleen: No splenic injury or perisplenic hematoma.  Adrenals/Urinary Tract: No adrenal hemorrhage or renal injury identified. The bladder is partially decompressed around a suprapubic catheter. Stomach/Bowel: Stomach is unremarkable. No pathologic dilatation of the large or small bowel loops. The appendix is visualized and is normal. There is a moderate to large stool burden identified within the ascending colon. No bowel wall thickening or inflammatory changes identified Vascular/Lymphatic: Aortic atherosclerosis. No enlarged abdominal or pelvic lymph nodes. Reproductive: Prostate is unremarkable. Other: No free fluid or fluid collections. Small fat containing umbilical hernia. Musculoskeletal: No fracture is seen. IMPRESSION: 1. Acute fractures involving the posterior aspect of the left tenth and eleventh ribs posteriorly. 2. Small volume of pleural fluid is identified within the left base. The fluid measures water density. No pneumothorax identified. 3. Multifocal areas of subsegmental atelectasis, ground-glass attenuation, and posterior consolidative change within the left lower lobe. Findings may reflect pulmonary contusion and/or atelectasis. 4. No acute findings within the abdomen or pelvis. 5. Moderate to large stool burden identified within  the ascending colon. Correlate for any clinical signs or symptoms of constipation. 6. Coronary artery calcifications. 7.  Aortic Atherosclerosis (ICD10-I70.0). Electronically Signed   By: Signa Kell M.D.   On: 11/06/2023 13:56    Catarina Hartshorn, DO  [facility]  If 7PM-7AM, please contact night-coverage [email or web address] Password [email or web address] 11/12/2023, 10:11 AM   LOS: 6 days

## 2023-11-13 DIAGNOSIS — T83510D Infection and inflammatory reaction due to cystostomy catheter, subsequent encounter: Secondary | ICD-10-CM | POA: Diagnosis not present

## 2023-11-13 DIAGNOSIS — G9341 Metabolic encephalopathy: Secondary | ICD-10-CM | POA: Diagnosis not present

## 2023-11-13 DIAGNOSIS — N179 Acute kidney failure, unspecified: Secondary | ICD-10-CM | POA: Diagnosis not present

## 2023-11-13 DIAGNOSIS — Z9359 Other cystostomy status: Secondary | ICD-10-CM | POA: Diagnosis not present

## 2023-11-13 LAB — BASIC METABOLIC PANEL
Anion gap: 8 (ref 5–15)
BUN: 44 mg/dL — ABNORMAL HIGH (ref 8–23)
CO2: 20 mmol/L — ABNORMAL LOW (ref 22–32)
Calcium: 8.3 mg/dL — ABNORMAL LOW (ref 8.9–10.3)
Chloride: 120 mmol/L — ABNORMAL HIGH (ref 98–111)
Creatinine, Ser: 1.77 mg/dL — ABNORMAL HIGH (ref 0.61–1.24)
GFR, Estimated: 43 mL/min — ABNORMAL LOW (ref >60)
Glucose, Bld: 265 mg/dL — ABNORMAL HIGH (ref 70–99)
Potassium: 3.4 mmol/L — ABNORMAL LOW (ref 3.5–5.1)
Sodium: 148 mmol/L — ABNORMAL HIGH (ref 135–145)

## 2023-11-13 LAB — MAGNESIUM: Magnesium: 2.1 mg/dL (ref 1.7–2.4)

## 2023-11-13 LAB — GLUCOSE, CAPILLARY
Glucose-Capillary: 322 mg/dL — ABNORMAL HIGH (ref 70–99)
Glucose-Capillary: 219 mg/dL — ABNORMAL HIGH (ref 70–99)

## 2023-11-13 MED ORDER — POTASSIUM CL IN DEXTROSE 5% 20 MEQ/L IV SOLN
20.0000 meq | INTRAVENOUS | Status: DC
  Administered 2023-11-13 – 2023-11-14 (×2): 20 meq via INTRAVENOUS

## 2023-11-13 MED ORDER — INSULIN GLARGINE-YFGN 100 UNIT/ML ~~LOC~~ SOLN
15.0000 [IU] | Freq: Every day | SUBCUTANEOUS | Status: DC
  Administered 2023-11-14: 15 [IU] via SUBCUTANEOUS
  Filled 2023-11-13 (×2): qty 0.15

## 2023-11-13 MED ORDER — INSULIN ASPART 100 UNIT/ML IJ SOLN
3.0000 [IU] | Freq: Three times a day (TID) | INTRAMUSCULAR | Status: DC
  Administered 2023-11-14 (×2): 3 [IU] via SUBCUTANEOUS

## 2023-11-13 MED ORDER — POTASSIUM CHLORIDE 20 MEQ PO PACK
20.0000 meq | PACK | Freq: Once | ORAL | Status: AC
  Administered 2023-11-13: 20 meq via ORAL
  Filled 2023-11-13: qty 1

## 2023-11-13 MED ORDER — CEFDINIR 300 MG PO CAPS
300.0000 mg | ORAL_CAPSULE | Freq: Two times a day (BID) | ORAL | Status: DC
  Administered 2023-11-13 – 2023-11-14 (×2): 300 mg via ORAL
  Filled 2023-11-13 (×2): qty 1

## 2023-11-13 NOTE — Progress Notes (Addendum)
PROGRESS NOTE  Brian Nielsen OZD:[id number] DOB: [birth year] DOA: 11/06/2023 PCP: Chyrl Civatte, FNP  Brief History:  62 y.o. male with medical history significant for HTN, DM, CKD3, head and neck cancer, anemia, suprapubic catheter.  Patient presented to the ED with complaints of a fall and confusion. On my evaluation patient is awake alert but fidgety, able to answer simple questions, he remembers falling but not give a lot of history.  Spouse and son who are at bedside.  Confusion started about a week ago, with patient saying things that did not make sense, they thought this was related to patient's pain medication - they are not sure exactly how much he was taking but they believe he was taking more than prescribed.  He fell about a week ago, and then fell again yesterday.  They report yesterday he was standing and weaving about the bed and fell.  Patient denies dizziness, denies chest pain no difficulty breathing, but he is having pain to his left side of his chest.  They report a chronic productive cough that is unchanged related to his throat cancer and treatment. Spouse reports some initial difficulty breathing today on EMS arrival, but this had resolved on arrival to the ED. He is on ulcers in his mouth, but the report reports stable oral intake.  No vomiting.  He is constipated.  No fevers.   ED Course: Blood pressure systolic down to 25/95 improved with fluid bolus currently low 90s.  O2 sats-documented at 88% on room air- per EDP- was an error. Patient was 98% on room on my air evaluation.  Creatinine elevated at 5.38.  Sodium 129.  Mildly elevated liver enzymes AST 198, ALT 183.  Lactic acid 2.1.  Pelvic X-ray negative for acute abnormality.  CT chest abdomen and pelvis-acute fractures-posterior left 10th and 11th ribs, small volume pleural fluid within left base-fluid measures water density.  No pneumothorax.  Multifocal subsegmental atelectasis, groundglass attenuation posterior  consolidative change left lung-large contusion or atelectasis.  IV ceftriaxone and azithromycin started.  1.5 L bolus given.   Assessment/Plan: Acute metabolic encephalopathy Multifactorial including  UTI, opiates, AKI, hypotension.   -Presenting with falls, confusion.  CT shows pulmonary contusion.  -Chronic indwelling suprapubic catheter with UA>50 WBC -Obtain head CT - negative for acute findings  -Obtain urine culture-->> klebsiella pneumonia -Hydrated with IV fluids -Limit opiates -Continue IV ceftriaxone >>cefdinir  -Check procalcitonin>>7.44  -suprapubic catheter was obstructed and may have been contributed to confusion, relieved now and flowing freely  -telemonitoring for safety -haloperidol ordered to be used only for agitation -if persists would request a teleneurology consult -wife requests to limit or eliminate opioids if possible -11/12/23--overall mental status gradually improving although he has waxing and waning episodes of agitation and somnolence -11/13/23--mental status continues to improve, less agitation   Klebsiella UTI  - continue IV ceftriaxone daily  -transitioned to cefdinir   Aspiration pneumonitis -continue ceftriaxone -stable on RA   Dysphagia -appreciate speech therapy follow up -MBSS>>dysphagia 2 with NTL -he continues to have very po intake, much driven by pain -pt and spouse request transfer to [facility] or [facility] -I called [facility] 12/27 and 12/28 and 12/29--no capacity presently -I called [facility] 12/29--on wait list   Opioid dependence -PDMP reviewed -oxycodone 5 mg, #80, last refill 10/27/23, 10/05/23 -tramadol 50 mg, #12, filled 11/02/23 -Xtampza 9 mg, #60, filled 10/10/23   Acute fractures left ribs 10/11 - supportive measures ordered -  pain management as needed  - incentive spirometry ordered    Hyponatremia>>>hypernatremia Mild hyponatremia sodium 129.  Baseline 137-139. -Hydrated with normal saline for brief  time -12/28--started D5W>>Na slowly improving -12/29 increase rate D5W to 75 cc/hr   Transaminitis AST 198, ALT 183.  Normal T. bili 0.4, ALP 73.   -No abdominal pain.  CT abdomen and pelvis without focal liver abnormality, status postcholecystectomy no biliary dilatation.  -Trend for now likely from initial hypotension,  -LFTs trending down>>improved now -Hold Lipitor -Per Care Everywhere recent hepatitis B- 09/2023 nonreactive. - Check Hep C RNA--neg - outpatient GI follow up with Dr. Servando Snare [facility] GI    Fall at home, initial encounter Likely secondary to encephalopathy, opiates, hypotension/dehydration.  CT chest shows left 10th and 11th rib fractures, suggest pulmonary contusion/atelectasis.  He reports a chronic cough and some dyspnea prior to arrival in the ED. Not hypoxic with O2 sats 98% on room air currently.  Afebrile.  WBC 5.  Lactic acid 2.1 - Most likely pulmonary contusion, IV ceftriaxone and azithromycin started in ED, continue for now with low threshold to discontinue -PT eval recommending CIR - placed order for CIR evaluation, CIR declined patient; TOC working on SNF   -fall precautions    Acute kidney injury superimposed on CKD stage 3b  -Cr-5.38 at the time of admission -secondary to sepsis and volume depletion -possibly secondary to suprapubic catheter obstruction which is now relieved and urine flowing freely  -baseline creatinine 1.8-2.1   Oropharynx cancer  Completed chemoradiation therapy.  Follows with [facility] oncology.   OSA (obstructive sleep apnea) Resume CPAP nightly   Diabetes mellitus with stage 3 chronic kidney disease  -repeat A1C -increase semglee to 12 units - SSi- S   Essential hypertension, benign Presented with hypotension but now having rebound high blood pressures. -d/c lisinopril in setting of AKI -Resumed clonidine 0.2 mg TID  -start amlodipine 5 mg daily -IV hydralazine PRN   BPH (benign prostatic hyperplasia) Chronic suprapubic  catheter.  Last changed per spouse about 2 weeks ago.  Follows with urology.   DVT prophylaxis: sq heparin  Code Status: Full  Family Communication: wife updated bedside 12/24, telephone 12/26  Disposition: CIR recommended by PT    Consultants: palliative   Procedures:    Antimicrobials:  CTX 12/22>>12/27 Cefdinir 12/29>> Azithromycin 12/22>> 12/25             Subjective: Still hurts to swallow, but slowly improving.  Denies f/c, cp, sob, n/v/d, abd pain  Objective: Vitals:   11/12/23 2030 11/12/23 2246 11/13/23 0602 11/13/23 1346  BP: (!) 158/69 (!) 158/69 (!) 168/75 (!) 142/68  Pulse: 63  (!) 59 67  Resp:    18  Temp: 98.6 F (37 C)  98.6 F (37 C) 99.1 F (37.3 C)  TempSrc: Oral  Oral Oral  SpO2: 99%  97% 99%    Intake/Output Summary (Last 24 hours) at 11/13/2023 1758 Last data filed at 11/13/2023 1700 Gross per 24 hour  Intake 962.04 ml  Output 1850 ml  Net -887.96 ml   Weight change:  Exam:  General:  Pt is alert, follows commands appropriately, not in acute distress HEENT: No icterus, No thrush, No neck mass, [place]/AT Cardiovascular: RRR, S1/S2, no rubs, no gallops Respiratory: bibasilar rales.  No wheeze Abdomen: Soft/+BS, non tender, non distended, no guarding Extremities: trace LE edema, No lymphangitis, No petechiae, No rashes, no synovitis   Data Reviewed: I have personally reviewed following labs and imaging studies Basic Metabolic  Panel: Recent Labs  Lab 11/08/23 0355 11/09/23 0427 11/11/23 0422 11/12/23 0449 11/13/23 0430  NA 138 142 147* 149* 148*  K 3.7 3.6 3.3* 3.2* 3.4*  CL 109 114* 120* 124* 120*  CO2 18* 20* 19* 20* 20*  GLUCOSE 87 173* 272* 235* 265*  BUN 64* 52* 57* 56* 44*  CREATININE 3.36* 2.50* 2.25* 1.98* 1.77*  CALCIUM 8.3* 8.6* 8.3* 8.2* 8.3*  MG  --   --  2.6*  --  2.1  PHOS 3.6  --  4.2  --   --    Liver Function Tests: Recent Labs  Lab 11/07/23 0427 11/08/23 0354 11/08/23 0355 11/12/23 0449  AST 114* 65*   --  13*  ALT 139* 116*  --  40  ALKPHOS 63 77  --  82  BILITOT 0.5 0.3  --  0.7  PROT 4.9* 5.2*  --  5.6*  ALBUMIN 1.9* 1.9* 1.9* 2.0*   No results for input(s): "LIPASE", "AMYLASE" in the last 168 hours. No results for input(s): "AMMONIA" in the last 168 hours. Coagulation Profile: No results for input(s): "INR", "PROTIME" in the last 168 hours. CBC: Recent Labs  Lab 11/07/23 0427 11/11/23 0422 11/12/23 0449  WBC 5.7 4.7 4.9  HGB 8.0* 7.8* 8.3*  HCT 25.1* 26.1* 27.6*  MCV 94.0 95.3 96.5  PLT 199 149* 171   Cardiac Enzymes: No results for input(s): "CKTOTAL", "CKMB", "CKMBINDEX", "TROPONINI" in the last 168 hours. BNP: Invalid input(s): "POCBNP" CBG: Recent Labs  Lab 11/12/23 1116 11/12/23 1643 11/12/23 2118 11/13/23 0247 11/13/23 1645  GLUCAP 311* 205* 293* 322* 219*   HbA1C: No results for input(s): "HGBA1C" in the last 72 hours. Urine analysis:    Component Value Date/Time   COLORURINE AMBER (A) 11/06/2023 1322   APPEARANCEUR TURBID (A) 11/06/2023 1322   APPEARANCEUR Clear 12/14/2022 1529   LABSPEC 1.017 11/06/2023 1322   PHURINE 5.0 11/06/2023 1322   GLUCOSEU NEGATIVE 11/06/2023 1322   HGBUR SMALL (A) 11/06/2023 1322   BILIRUBINUR NEGATIVE 11/06/2023 1322   BILIRUBINUR Negative 12/14/2022 1529   KETONESUR NEGATIVE 11/06/2023 1322   PROTEINUR >=300 (A) 11/06/2023 1322   NITRITE NEGATIVE 11/06/2023 1322   LEUKOCYTESUR LARGE (A) 11/06/2023 1322   Sepsis Labs: @LABRCNTIP (procalcitonin:4,lacticidven:4) ) Recent Results (from the past 240 hours)  Urine Culture (for pregnant, neutropenic or urologic patients or patients with an indwelling urinary catheter)     Status: Abnormal   Collection Time: 11/06/23  1:22 PM   Specimen: Urine, Clean Catch  Result Value Ref Range Status   Specimen Description   Final    URINE, CLEAN CATCH Performed at [facility], [address] [place], [place] [address]    Special Requests   Final    NONE Performed at [facility], [address] [place], [place] [address]    Culture >=100,000 COLONIES/mL KLEBSIELLA PNEUMONIAE (A)  Final   Report Status 11/09/2023 FINAL  Final   Organism ID, Bacteria KLEBSIELLA PNEUMONIAE (A)  Final      Susceptibility   Klebsiella pneumoniae - MIC*    AMPICILLIN RESISTANT Resistant     CEFAZOLIN <=4 SENSITIVE Sensitive     CEFEPIME <=0.12 SENSITIVE Sensitive     CEFTRIAXONE <=0.25 SENSITIVE Sensitive     CIPROFLOXACIN <=0.25 SENSITIVE Sensitive     GENTAMICIN <=1 SENSITIVE Sensitive     IMIPENEM <=0.25 SENSITIVE Sensitive     NITROFURANTOIN 32 SENSITIVE Sensitive     TRIMETH/SULFA <=20 SENSITIVE Sensitive     AMPICILLIN/SULBACTAM 4  SENSITIVE Sensitive     PIP/TAZO <=4 SENSITIVE Sensitive ug/mL    * >=100,000 COLONIES/mL KLEBSIELLA PNEUMONIAE     Scheduled Meds:  (feeding supplement) PROSource Plus  30 mL Oral TID BM   amitriptyline  25 mg Oral QHS   amLODipine  5 mg Oral Daily   aspirin EC  81 mg Oral Daily   Chlorhexidine Gluconate Cloth  6 each Topical Daily   cloNIDine  0.2 mg Oral TID   DULoxetine  20 mg Oral Daily   feeding supplement  237 mL Oral TID BM   heparin  5,000 Units Subcutaneous Q8H   insulin aspart  0-5 Units Subcutaneous QHS   insulin aspart  0-9 Units Subcutaneous TID WC   [START ON 11/14/2023] insulin aspart  3 Units Subcutaneous TID WC   [START ON 11/14/2023] insulin glargine-yfgn  15 Units Subcutaneous Daily   linaclotide  290 mcg Oral QAC breakfast   metoprolol tartrate  12.5 mg Oral BID   multivitamin with minerals  1 tablet Oral Daily   pantoprazole  40 mg Oral Daily   potassium chloride  20 mEq Oral Once   senna-docusate  1 tablet Oral QHS   topiramate  100 mg Oral BID   Continuous Infusions:  dextrose 5 % with KCl 20 mEq / L 20 mEq (11/13/23 1658)    Procedures/Studies: DG Swallowing Func-Speech Pathology Result Date: 11/11/2023 Table formatting from the original result was not included. Modified Barium Swallow Study Patient  Details Name: Brian KUDELKA MRN: [id number] Date of Birth: [birth year] Today's Date: 11/11/2023 HPI/PMH: HPI: 62 y.o. male with medical history significant for HTN, DM, CKD3, head and neck cancer, anemia, suprapubic catheter.  Patient presented to the ED with complaints of a fall and confusion.   On my evaluation patient is awake alert but fidgety, able to answer simple questions, he remembers falling but not give a lot of history.  Spouse and son who are at bedside.  Confusion started about a week ago, with patient saying things that did not make sense, they thought this was related to patient's pain medication - they are not sure exactly how much he was taking but they believe he was taking more than prescribed.  He fell about a week ago, and then fell again yesterday.  They report yesterday he was standing and weaving about the bed and fell.  Patient denies dizziness, denies chest pain no difficulty breathing, but he is having pain to his left side of his chest.  They report a chronic productive cough that is unchanged related to his throat cancer and treatment. Spouse reports some initial difficulty breathing today on EMS arrival, but this had resolved on arrival to the ED.   He is on ulcers in his mouth, but the report reports stable oral intake.  No vomiting.  He is constipated.  No fevers. Right tonsil HPV+ SCC. Started radiation 11/8. The total radiation dose will be 7000 cGy at 200 cGy/fraction for a total of 35 fractions, treated once a day. Chemotherapy administered concurrently. Plan for tx completion 12/20. BSE complete and MBSS recommended Clinical Impression: Clinical Impression: Pt presents with moderate sensorimotor oropharyngeal dysphagia in the setting of post radiation therapy. Note Pt's mentation is currently altered and he is unable to follow commands without max cues/support. He also requires min/mod support for feeding. Oral stage is characterized by slow disorganized oral stage followed by  premature spillage of liquids a delayed swallowing trigger and occasional silent aspiration of thin liquids before  the swallow. Mod amounts of aspiration observed with sequential straw sips of thin liquid which resulted in a reflexive hard cough that was somewhat effective in clearing aspirates. Cued cough for silent aspiration was effective X1 for clearing some aspirates from the airway but majority of the time Pt was unable to produce a cough or it was produced but ineffective for clearing aspirates. Note decreased pharyngeal stripping wave, decreased epiglottic deflection and decreased laryngeal vestibule closure. Nectar thick liquids resulted in increased pharyngeal residue in diffuse locations (valleculae, posterior wall, pyriforms, aryepiglotic folds); residue was only slightly cleared with repeat swallows. Note occasional flash penetration of NTL however, no aspiraiton of NTL was visualized. HTL, puree and regular textures were consumed with prolonged oral stage, delayed swallowing trigger and pharyngeal residue however note good laryngeal vestibule closure and airway protection with these textures. Recommend continue with D2/fine chop diet and downgrade liquids to NECTAR thick liquids. Presented barium tablet and Pt masticated tablet prior to swallowing; recommend crush meds and administer with puree. Recommend free water protocol: water only after oral care, in between meals. Recommend strategies re: alternate bites/sips, repeat dry swallows after each bite/sip as Pt is able. ST will continue to follow acutely and will benefit from ST f/u after d/c. Factors that may increase risk of adverse event in presence of aspiration Rubye Oaks & Clearance Coots 2021): Factors that may increase risk of adverse event in presence of aspiration Rubye Oaks & Clearance Coots 2021): Frail or deconditioned; Weak cough Recommendations/Plan: Swallowing Evaluation Recommendations Swallowing Evaluation Recommendations Recommendations: PO diet PO Diet  Recommendation: Dysphagia 2 (Finely chopped); Mildly thick liquids (Level 2, nectar thick) Liquid Administration via: Cup; Straw Medication Administration: Whole meds with puree Supervision: Patient able to self-feed; Staff to assist with self-feeding; Full assist for feeding; Set-up assistance for safety; Full supervision/cueing for swallowing strategies Swallowing strategies  : Slow rate; Small bites/sips; Multiple dry swallows after each bite/sip Postural changes: Position pt fully upright for meals Oral care recommendations: Oral care BID (2x/day); Oral care before ice chips/water Caregiver Recommendations: Avoid jello, ice cream, thin soups, popsicles; Remove water pitcher Treatment Plan Treatment Plan Treatment recommendations: Therapy as outlined in treatment plan below Follow-up recommendations: Outpatient SLP Functional status assessment: Patient has had a recent decline in their functional status and demonstrates the ability to make significant improvements in function in a reasonable and predictable amount of time. Treatment frequency: Min 2x/week Treatment duration: 1 week Interventions: Aspiration precaution training; Oropharyngeal exercises; Compensatory techniques; Patient/family education; Trials of upgraded texture/liquids; Diet toleration management by SLP; Respiratory muscle strength training Recommendations Recommendations for follow up therapy are one component of a multi-disciplinary discharge planning process, led by the attending physician.  Recommendations may be updated based on patient status, additional functional criteria and insurance authorization. Assessment: Orofacial Exam: Orofacial Exam Oral Cavity: Oral Hygiene: Xerostomia Oral Cavity - Dentition: Adequate natural dentition; Poor condition Anatomy: Anatomy: WFL Boluses Administered: Boluses Administered Boluses Administered: Thin liquids (Level 0); Mildly thick liquids (Level 2, nectar thick); Moderately thick liquids (Level 3,  honey thick); Puree; Solid  Oral Impairment Domain: Oral Impairment Domain Lip Closure: Interlabial escape, no progression to anterior lip Tongue control during bolus hold: Escape to lateral buccal cavity/floor of mouth Bolus preparation/mastication: Slow prolonged chewing/mashing with complete recollection; Disorganized chewing/mashing with solid pieces of bolus unchewed Bolus transport/lingual motion: Slow tongue motion; Repetitive/disorganized tongue motion Oral residue: Residue collection on oral structures Location of oral residue : Floor of mouth; Tongue; Palate Initiation of pharyngeal swallow : Pyriform sinuses; Posterior laryngeal surface  of the epiglottis; Valleculae; Posterior angle of the ramus  Pharyngeal Impairment Domain: Pharyngeal Impairment Domain Soft palate elevation: No bolus between soft palate (SP)/pharyngeal wall (PW) Laryngeal elevation: Partial superior movement of thyroid cartilage/partial approximation of arytenoids to epiglottic petiole Anterior hyoid excursion: Partial anterior movement Epiglottic movement: Partial inversion Laryngeal vestibule closure: Incomplete, narrow column air/contrast in laryngeal vestibule Pharyngeal stripping wave : Present - diminished Pharyngeal contraction (A/P view only): N/A Pharyngoesophageal segment opening: Partial distention/partial duration, partial obstruction of flow Tongue base retraction: Trace column of contrast or air between tongue base and PPW Pharyngeal residue: Collection of residue within or on pharyngeal structures Location of pharyngeal residue: Valleculae; Pharyngeal wall; Aryepiglottic folds; Pyriform sinuses; Diffuse (>3 areas)  Esophageal Impairment Domain: No data recorded Pill: Pill Consistency administered: Thin liquids (Level 0) Penetration/Aspiration Scale Score: Penetration/Aspiration Scale Score 5.  Material enters airway, CONTACTS cords and not ejected out: Thin liquids (Level 0) 6.  Material enters airway, passes BELOW cords  then ejected out: Thin liquids (Level 0) 7.  Material enters airway, passes BELOW cords and not ejected out despite cough attempt by patient: Thin liquids (Level 0) 8.  Material enters airway, passes BELOW cords without attempt by patient to eject out (silent aspiration) : Thin liquids (Level 0) Compensatory Strategies: Compensatory Strategies Compensatory strategies: No   General Information: Caregiver present: No  Diet Prior to this Study: Dysphagia 2 (finely chopped); Thin liquids (Level 0)   Temperature : Normal   Respiratory Status: WFL   No data recorded  No data recorded Behavior/Cognition: Alert; Cooperative; Pleasant mood; Confused; Requires cueing; Doesn't follow directions Self-Feeding Abilities: Able to self-feed; Needs set-up for self-feeding; Needs assist with self-feeding Baseline vocal quality/speech: Hypophonia/low volume No data recorded Volitional Swallow: Able to elicit No data recorded Goal Planning: Prognosis for improved oropharyngeal function: Fair Barriers to Reach Goals: Severity of deficits No data recorded Patient/Family Stated Goal: N/A No data recorded Pain: Pain Assessment Pain Assessment: Faces Faces Pain Scale: 4 Breathing: 0 Negative Vocalization: 1 Facial Expression: 2 Body Language: 1 Consolability: 1 PAINAD Score: 5 Pain Location: "throat" Pain Descriptors / Indicators: Grimacing; Guarding Pain Intervention(s): Limited activity within patient's tolerance; Monitored during session End of Session: Start Time:SLP Start Time (ACUTE ONLY): 1156 Stop Time: SLP Stop Time (ACUTE ONLY): 1223 Time Calculation:SLP Time Calculation (min) (ACUTE ONLY): 27 min Charges: SLP Evaluations $ SLP Speech Visit: 1 Visit SLP Evaluations $MBS Swallow: 1 Procedure $Swallowing Treatment: 1 Procedure SLP visit diagnosis: SLP Visit Diagnosis: Dysphagia, unspecified (R13.10) Past Medical History: Past Medical History: Diagnosis Date  Adult celiac disease 2015  followed by dr Servando Snare (GI)  Anemia associated with  chronic renal failure   hematologist--- dr Sarajane Jews;  treated with iron infusions  Benign localized prostatic hyperplasia with lower urinary tract symptoms (LUTS)   urologist--- dr Retta Diones  Cancer [facility]) 08/18/2023  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  Charcot foot due to diabetes mellitus (HCC)   Chronic constipation   CKD (chronic kidney disease), stage III [facility])   nephrologist--- dr Wolfgang Phoenix;  renal bx 11-20-2021  Diverticulosis of colon   Edema of both lower extremities   GERD (gastroesophageal reflux disease)   History of adenomatous polyp of colon   HTN (hypertension)   Hyperlipidemia, mixed   Peripheral neuropathy   Peyronie's disease   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 meatal stricture   Retinopathy due to secondary diabetes mellitus (HCC)   both eyes  treated with injecitons  Thoracic spondylosis without myelopathy 11/22/2016  Type 2 diabetes mellitus (  [facility])   endocrinologist--- dr nida  Vitamin D deficiency   Wears hearing aid in both ears  Past Surgical History: Past Surgical History: Procedure Laterality Date  BIOPSY N/A 09/07/2021  Procedure: BIOPSY;  Surgeon: Midge Minium, MD;  Location: [facility];  Service: Endoscopy;  Laterality: N/A;  CATARACT EXTRACTION W/ INTRAOCULAR LENS IMPLANT Bilateral 2023  COLONOSCOPY N/A 05/03/2014  Procedure: COLONOSCOPY;  Surgeon: West Bali, MD;  Location: [facility];  Service: Endoscopy;  Laterality: N/A;  12:00  COLONOSCOPY WITH PROPOFOL N/A 09/07/2021  Procedure: COLONOSCOPY WITH PROPOFOL;  Surgeon: Midge Minium, MD;  Location: [facility];  Service: Endoscopy;  Laterality: N/A;  CYSTOSCOPY WITH INJECTION N/A 07/26/2023  Procedure: CYSTOSCOPY WITH BOTOX INJECTION 100 UNITS;  Surgeon: Noel Christmas, MD;  Location: [facility] ORS;  Service: Urology;  Laterality: N/A;  30 minutes  CYSTOSCOPY WITH URETHRAL DILATATION N/A 01/10/2023  Procedure: CYSTOSCOPY;  Surgeon: Marcine Matar, MD;  Location: [facility];  Service:  Urology;  Laterality: N/A;  ESOPHAGOGASTRODUODENOSCOPY N/A 05/03/2014  Procedure: ESOPHAGOGASTRODUODENOSCOPY (EGD);  Surgeon: West Bali, MD;  Location: [facility];  Service: Endoscopy;  Laterality: N/A;  ESOPHAGOGASTRODUODENOSCOPY (EGD) WITH PROPOFOL N/A 06/03/2020  Procedure: ESOPHAGOGASTRODUODENOSCOPY (EGD) WITH PROPOFOL;  Surgeon: Regis Bill, MD;  Location: [facility] ENDOSCOPY;  Service: Endoscopy;  Laterality: N/A;  ESOPHAGOGASTRODUODENOSCOPY (EGD) WITH PROPOFOL N/A 09/07/2021  Procedure: ESOPHAGOGASTRODUODENOSCOPY (EGD) WITH PROPOFOL;  Surgeon: Midge Minium, MD;  Location: [facility];  Service: Endoscopy;  Laterality: N/A;  Diabetic  IR BONE MARROW BIOPSY & ASPIRATION  04/27/2023  MEATOTOMY N/A 01/10/2023  Procedure: MEATOTOMY ADULT;  Surgeon: Marcine Matar, MD;  Location: [facility];  Service: Urology;  Laterality: N/A;  PARS PLANA VITRECTOMY Right 03/21/2021  Procedure: PARS PLANA VITRECTOMY 25 GAUGE WITH INJECTION OF ANTIBIOTICS FOR ENDOPHTHALMITIS;  Surgeon: Carmela Rima, MD;  Location: [facility];  Service: Ophthalmology;  Laterality: Right;  POLYPECTOMY N/A 09/07/2021  Procedure: POLYPECTOMY;  Surgeon: Midge Minium, MD;  Location: [facility];  Service: Endoscopy;  Laterality: N/A;  REPAIR EXTENSOR TENDON Right 08/11/2022  Procedure: EXTENSOR CARPI ULNARIS TENDON DEBRIDEMENT, POSSIBLE SUBSHEATH RECONSTRUCTION;  Surgeon: Marlyne Beards, MD;  Location: [facility];  Service: Orthopedics;  Laterality: Right;  or regional plus MAC Amelia H. Romie Levee, CCC-SLP Speech Language Pathologist Georgetta Haber 11/11/2023, 2:52 PM  CT HEAD WO CONTRAST ( ) Result Date: 11/06/2023 CLINICAL DATA:  Patient fell 2 days ago. History of head neck cancer. Encephalopathy. EXAM: CT HEAD WITHOUT CONTRAST TECHNIQUE: Contiguous axial images were obtained from the base of the skull through the vertex without intravenous contrast. RADIATION DOSE REDUCTION:  This exam was performed according to the departmental dose-optimization program which includes automated exposure control, adjustment of the mA and/or kV according to patient size and/or use of iterative reconstruction technique. COMPARISON:  05/12/2021 FINDINGS: Brain: There is no evidence for acute hemorrhage, hydrocephalus, mass lesion, or abnormal extra-axial fluid collection. No definite CT evidence for acute infarction. Vascular: No hyperdense vessel or unexpected calcification. Skull: No evidence for fracture. No worrisome lytic or sclerotic lesion. Sinuses/Orbits: The visualized paranasal sinuses and mastoid air cells are clear. Visualized portions of the globes and intraorbital fat are unremarkable. Other: None. IMPRESSION: No acute intracranial abnormality. Electronically Signed   By: Kennith Center M.D.   On: 11/06/2023 16:29   DG Pelvis Portable Result Date: 11/06/2023 CLINICAL DATA:  Fall. EXAM: PORTABLE PELVIS 1-2 VIEWS COMPARISON:  Same day CT chest, abdomen, and pelvis. FINDINGS: There is no  evidence of pelvic fracture or diastasis. The sacroiliac joints and pubic symphysis appear anatomically aligned. No pelvic bone lesions are seen. IMPRESSION: No acute osseous abnormality identified on AP pelvic radiograph. Electronically Signed   By: Hart Robinsons M.D.   On: 11/06/2023 14:09   DG Chest Port 1 View Result Date: 11/06/2023 CLINICAL DATA:  Fall. EXAM: PORTABLE CHEST 1 VIEW COMPARISON:  Same day CT chest, abdomen, and pelvis. Chest radiograph dated February 03, 2023. FINDINGS: Low lung volumes with accentuation of the cardiomediastinal silhouette. Overlapping telemetry wires. Patchy opacities at the left lung base. Blunting of the left costophrenic angle. The right lung appears clear. No pneumothorax. Posterior left tenth and eleventh rib fractures are not well visualized on this exam and better evaluated on the same-day CT chest, abdomen, and pelvis. IMPRESSION: 1. Posterior left tenth and  eleventh rib fractures are not well visualized on this exam and better evaluated on the same-day CT chest, abdomen, and pelvis. No pneumothorax. 2. Patchy opacities at the left lung base could reflect contusion, infiltrate, or atelectasis. 3. Probable trace left effusion. Electronically Signed   By: Hart Robinsons M.D.   On: 11/06/2023 14:06   CT CHEST ABDOMEN PELVIS WO CONTRAST Result Date: 11/06/2023 CLINICAL DATA:  Fall 2 days ago. Blunt trauma. New onset hypoxia. Rib pain. EXAM: CT CHEST, ABDOMEN AND PELVIS WITHOUT CONTRAST TECHNIQUE: Multidetector CT imaging of the chest, abdomen and pelvis was performed following the standard protocol without IV contrast. RADIATION DOSE REDUCTION: This exam was performed according to the departmental dose-optimization program which includes automated exposure control, adjustment of the mA and/or kV according to patient size and/or use of iterative reconstruction technique. COMPARISON:  CT AP from 10/15/2022 FINDINGS: CT CHEST FINDINGS Cardiovascular: Heart size appears mildly enlarged. There is no pericardial effusion. Aortic atherosclerotic calcifications and coronary artery calcifications. Mediastinum/Nodes: No enlarged mediastinal, hilar, or axillary lymph nodes. Thyroid gland, trachea, and esophagus demonstrate no significant findings. Lungs/Pleura: Small volume of pleural fluid is identified within the left base the fluid measures water density. Lingular atelectasis. In the left lower lobe there are multifocal areas of subsegmental atelectasis, ground-glass attenuation, and posterior consolidative change. No pneumothorax identified. Musculoskeletal: There are acute fractures involving the posterior aspect of the left tenth and eleventh ribs posteriorly, image 53/2 and image 58/2. The thoracic vertebral body heights are well preserved without signs of acute fracture. Intact sternum. CT ABDOMEN PELVIS FINDINGS Hepatobiliary: No focal liver abnormality is seen. Status  post cholecystectomy. No biliary dilatation. Pancreas: Unremarkable. No pancreatic ductal dilatation or surrounding inflammatory changes. Spleen: No splenic injury or perisplenic hematoma. Adrenals/Urinary Tract: No adrenal hemorrhage or renal injury identified. The bladder is partially decompressed around a suprapubic catheter. Stomach/Bowel: Stomach is unremarkable. No pathologic dilatation of the large or small bowel loops. The appendix is visualized and is normal. There is a moderate to large stool burden identified within the ascending colon. No bowel wall thickening or inflammatory changes identified Vascular/Lymphatic: Aortic atherosclerosis. No enlarged abdominal or pelvic lymph nodes. Reproductive: Prostate is unremarkable. Other: No free fluid or fluid collections. Small fat containing umbilical hernia. Musculoskeletal: No fracture is seen. IMPRESSION: 1. Acute fractures involving the posterior aspect of the left tenth and eleventh ribs posteriorly. 2. Small volume of pleural fluid is identified within the left base. The fluid measures water density. No pneumothorax identified. 3. Multifocal areas of subsegmental atelectasis, ground-glass attenuation, and posterior consolidative change within the left lower lobe. Findings may reflect pulmonary contusion and/or atelectasis. 4. No acute findings within  the abdomen or pelvis. 5. Moderate to large stool burden identified within the ascending colon. Correlate for any clinical signs or symptoms of constipation. 6. Coronary artery calcifications. 7.  Aortic Atherosclerosis (ICD10-I70.0). Electronically Signed   By: Signa Kell M.D.   On: 11/06/2023 13:56    Catarina Hartshorn, DO  [facility]  If 7PM-7AM, please contact night-coverage [email or web address] Password [email or web address] 11/13/2023, 5:58 PM   LOS: 7 days

## 2023-11-13 NOTE — Plan of Care (Signed)
  Problem: Education: Goal: Knowledge of General Education information will improve Description: Including pain rating scale, medication(s)/side effects and non-pharmacologic comfort measures Outcome: Progressing   Problem: Health Behavior/Discharge Planning: Goal: Ability to manage health-related needs will improve Outcome: Progressing   Problem: Clinical Measurements: Goal: Ability to maintain clinical measurements within normal limits will improve Outcome: Progressing Goal: Will remain free from infection Outcome: Progressing Goal: Diagnostic test results will improve Outcome: Progressing Goal: Respiratory complications will improve Outcome: Progressing Goal: Cardiovascular complication will be avoided Outcome: Progressing   Problem: Activity: Goal: Risk for activity intolerance will decrease Outcome: Progressing   Problem: Nutrition: Goal: Adequate nutrition will be maintained Outcome: Progressing   Problem: Coping: Goal: Level of anxiety will decrease Outcome: Progressing   Problem: Elimination: Goal: Will not experience complications related to bowel motility Outcome: Progressing Goal: Will not experience complications related to urinary retention Outcome: Progressing   Problem: Pain Management: Goal: General experience of comfort will improve Outcome: Progressing   Problem: Safety: Goal: Ability to remain free from injury will improve Outcome: Progressing   Problem: Skin Integrity: Goal: Risk for impaired skin integrity will decrease Outcome: Progressing   Problem: Education: Goal: Ability to describe self-care measures that may prevent or decrease complications (Diabetes Survival Skills Education) will improve Outcome: Progressing Goal: Individualized Educational Video(s) Outcome: Progressing   Problem: Coping: Goal: Ability to adjust to condition or change in health will improve Outcome: Progressing   Problem: Fluid Volume: Goal: Ability to  maintain a balanced intake and output will improve Outcome: Progressing   Problem: Health Behavior/Discharge Planning: Goal: Ability to identify and utilize available resources and services will improve Outcome: Progressing Goal: Ability to manage health-related needs will improve Outcome: Progressing   Problem: Metabolic: Goal: Ability to maintain appropriate glucose levels will improve Outcome: Progressing   Problem: Nutritional: Goal: Maintenance of adequate nutrition will improve Outcome: Progressing Goal: Progress toward achieving an optimal weight will improve Outcome: Progressing   Problem: Skin Integrity: Goal: Risk for impaired skin integrity will decrease Outcome: Progressing   Problem: Tissue Perfusion: Goal: Adequacy of tissue perfusion will improve Outcome: Progressing   Problem: Safety: Goal: Non-violent Restraint(s) Outcome: Progressing

## 2023-11-13 NOTE — Plan of Care (Signed)

## 2023-11-14 DIAGNOSIS — E1122 Type 2 diabetes mellitus with diabetic chronic kidney disease: Secondary | ICD-10-CM | POA: Diagnosis not present

## 2023-11-14 DIAGNOSIS — G9341 Metabolic encephalopathy: Secondary | ICD-10-CM | POA: Diagnosis not present

## 2023-11-14 DIAGNOSIS — C109 Malignant neoplasm of oropharynx, unspecified: Secondary | ICD-10-CM | POA: Diagnosis not present

## 2023-11-14 DIAGNOSIS — Z9359 Other cystostomy status: Secondary | ICD-10-CM | POA: Diagnosis not present

## 2023-11-14 LAB — BASIC METABOLIC PANEL
Anion gap: 5 (ref 5–15)
BUN: 40 mg/dL — ABNORMAL HIGH (ref 8–23)
CO2: 18 mmol/L — ABNORMAL LOW (ref 22–32)
Calcium: 8.2 mg/dL — ABNORMAL LOW (ref 8.9–10.3)
Chloride: 117 mmol/L — ABNORMAL HIGH (ref 98–111)
Creatinine, Ser: 1.64 mg/dL — ABNORMAL HIGH (ref 0.61–1.24)
GFR, Estimated: 47 mL/min — ABNORMAL LOW (ref >60)
Glucose, Bld: 242 mg/dL — ABNORMAL HIGH (ref 70–99)
Potassium: 3.5 mmol/L (ref 3.5–5.1)
Sodium: 140 mmol/L (ref 135–145)

## 2023-11-14 LAB — GLUCOSE, CAPILLARY
Glucose-Capillary: 247 mg/dL — ABNORMAL HIGH (ref 70–99)
Glucose-Capillary: 322 mg/dL — ABNORMAL HIGH (ref 70–99)
Glucose-Capillary: 226 mg/dL — ABNORMAL HIGH (ref 70–99)
Glucose-Capillary: 209 mg/dL — ABNORMAL HIGH (ref 70–99)
Glucose-Capillary: 231 mg/dL — ABNORMAL HIGH (ref 70–99)
Glucose-Capillary: 277 mg/dL — ABNORMAL HIGH (ref 70–99)

## 2023-11-14 LAB — HEMOGLOBIN A1C
Hgb A1c MFr Bld: 7.8 % — ABNORMAL HIGH (ref 4.8–5.6)
Mean Plasma Glucose: 177 mg/dL

## 2023-11-14 MED ORDER — CEFDINIR 300 MG PO CAPS: 300.0000 mg | ORAL_CAPSULE | Freq: Two times a day (BID) | ORAL | 0 refills | Status: DC

## 2023-11-14 MED ORDER — CLONIDINE HCL 0.2 MG PO TABS: 0.2000 mg | ORAL_TABLET | Freq: Three times a day (TID) | ORAL | 1 refills | Status: DC

## 2023-11-14 MED ORDER — METOPROLOL TARTRATE 25 MG PO TABS: 12.5000 mg | ORAL_TABLET | Freq: Two times a day (BID) | ORAL | 1 refills | Status: DC

## 2023-11-14 MED ORDER — AMLODIPINE BESYLATE 5 MG PO TABS: 5.0000 mg | ORAL_TABLET | Freq: Every day | ORAL | 1 refills | Status: DC

## 2023-11-14 MED ORDER — ENSURE ENLIVE PO LIQD: 237.0000 mL | Freq: Three times a day (TID) | ORAL | Status: DC

## 2023-11-14 NOTE — Progress Notes (Signed)
 The beneficiary is confined to a single room and will need 3N1.

## 2023-11-14 NOTE — Plan of Care (Signed)
  Problem: Education: Goal: Knowledge of General Education information will improve Description: Including pain rating scale, medication(s)/side effects and non-pharmacologic comfort measures Outcome: Progressing   Problem: Health Behavior/Discharge Planning: Goal: Ability to manage health-related needs will improve Outcome: Progressing   Problem: Clinical Measurements: Goal: Ability to maintain clinical measurements within normal limits will improve Outcome: Progressing Goal: Will remain free from infection Outcome: Progressing Goal: Diagnostic test results will improve Outcome: Progressing Goal: Respiratory complications will improve Outcome: Progressing Goal: Cardiovascular complication will be avoided Outcome: Progressing   Problem: Activity: Goal: Risk for activity intolerance will decrease Outcome: Progressing   Problem: Nutrition: Goal: Adequate nutrition will be maintained Outcome: Progressing   Problem: Coping: Goal: Level of anxiety will decrease Outcome: Progressing   Problem: Elimination: Goal: Will not experience complications related to bowel motility Outcome: Progressing Goal: Will not experience complications related to urinary retention Outcome: Progressing   Problem: Pain Management: Goal: General experience of comfort will improve Outcome: Progressing   Problem: Safety: Goal: Ability to remain free from injury will improve Outcome: Progressing   Problem: Skin Integrity: Goal: Risk for impaired skin integrity will decrease Outcome: Progressing   Problem: Education: Goal: Ability to describe self-care measures that may prevent or decrease complications (Diabetes Survival Skills Education) will improve Outcome: Progressing Goal: Individualized Educational Video(s) Outcome: Progressing   Problem: Coping: Goal: Ability to adjust to condition or change in health will improve Outcome: Progressing   Problem: Fluid Volume: Goal: Ability to  maintain a balanced intake and output will improve Outcome: Progressing   Problem: Health Behavior/Discharge Planning: Goal: Ability to identify and utilize available resources and services will improve Outcome: Progressing Goal: Ability to manage health-related needs will improve Outcome: Progressing   Problem: Metabolic: Goal: Ability to maintain appropriate glucose levels will improve Outcome: Progressing   Problem: Nutritional: Goal: Maintenance of adequate nutrition will improve Outcome: Progressing Goal: Progress toward achieving an optimal weight will improve Outcome: Progressing   Problem: Skin Integrity: Goal: Risk for impaired skin integrity will decrease Outcome: Progressing   Problem: Tissue Perfusion: Goal: Adequacy of tissue perfusion will improve Outcome: Progressing   Problem: Safety: Goal: Non-violent Restraint(s) Outcome: Progressing

## 2023-11-14 NOTE — Discharge Summary (Signed)
Physician Discharge Summary   Patient: Brian Nielsen MRN: [id number] DOB: 04/21/1961  Admit date:     11/06/2023  Discharge date: 11/14/23  Discharge Physician: Onalee Hua Michaeal Davis   PCP: Chyrl Civatte, FNP   Recommendations at discharge:   Please follow up with primary care provider within 1-2 weeks  Please repeat BMP and CBC in one week    Hospital Course: 61 y.o. male with medical history significant for HTN, DM, CKD3, head and neck cancer, anemia, suprapubic catheter.  Patient presented to the ED with complaints of a fall and confusion. On my evaluation patient is awake alert but fidgety, able to answer simple questions, he remembers falling but not give a lot of history.  Spouse and son who are at bedside.  Confusion started about a week ago, with patient saying things that did not make sense, they thought this was related to patient's pain medication - they are not sure exactly how much he was taking but they believe he was taking more than prescribed.  He fell about a week ago, and then fell again yesterday.  They report yesterday he was standing and weaving about the bed and fell.  Patient denies dizziness, denies chest pain no difficulty breathing, but he is having pain to his left side of his chest.  They report a chronic productive cough that is unchanged related to his throat cancer and treatment. Spouse reports some initial difficulty breathing today on EMS arrival, but this had resolved on arrival to the ED. He is on ulcers in his mouth, but the report reports stable oral intake.  No vomiting.  He is constipated.  No fevers.   ED Course: Blood pressure systolic down to 32/44 improved with fluid bolus currently low 90s.  O2 sats-documented at 88% on room air- per EDP- was an error. Patient was 98% on room on my air evaluation.  Creatinine elevated at 5.38.  Sodium 129.  Mildly elevated liver enzymes AST 198, ALT 183.  Lactic acid 2.1.  Pelvic X-ray negative for acute abnormality.  CT chest  abdomen and pelvis-acute fractures-posterior left 10th and 11th ribs, small volume pleural fluid within left base-fluid measures water density.  No pneumothorax.  Multifocal subsegmental atelectasis, groundglass attenuation posterior consolidative change left lung-large contusion or atelectasis.  IV ceftriaxone and azithromycin started.  1.5 L bolus given.  Assessment and Plan: Acute metabolic encephalopathy Multifactorial including  UTI, opiates, AKI, hypotension.   -Presenting with falls, confusion.  CT shows pulmonary contusion.  -Chronic indwelling suprapubic catheter with UA>50 WBC -Obtain head CT - negative for acute findings  -Obtain urine culture-->> klebsiella pneumonia -Hydrated with IV fluids -Limit opiates -Continue IV ceftriaxone >>cefdinir  -Check procalcitonin>>7.44  -suprapubic catheter was obstructed and may have been contributed to confusion, relieved now and flowing freely  -telemonitoring for safety -haloperidol ordered to be used only for agitation -if persists would request a teleneurology consult -wife requests to limit or eliminate opioids if possible -11/12/23--overall mental status gradually improving although he has waxing and waning episodes of agitation and somnolence -11/13/23--mental status continues to improve, less agitation -12/30--mental status near baseline per spouse   Klebsiella UTI  - continue IV ceftriaxone daily  -transitioned to cefdinir x 3 more days after d/c   Aspiration pneumonitis -continue ceftriaxone -stable on RA   Dysphagia -appreciate speech therapy follow up -MBSS>>dysphagia 2 with NTL -he continues to have very po intake, much driven by pain -pt and spouse request transfer to [facility] or [facility] -I called [facility]  12/27 and 12/28 and 12/29--no capacity presently -I called [facility] 12/29--on wait list -12/30 discussion with spouse--pt is overall improving and tolerating dys 2 diet with NTL albeit eating small amounts and renal  function and electrolytes have improved -spouse states she wants to take patient home with HHPT, RN 12/30   Opioid dependence -PDMP reviewed -oxycodone 5 mg, #80, last refill 10/27/23, 10/05/23 -tramadol 50 mg, #12, filled 11/02/23 -Xtampza 9 mg, #60, filled 10/10/23   Acute fractures left ribs 10/11 - supportive measures ordered - pain management as needed  - incentive spirometry ordered    Hyponatremia>>>hypernatremia Mild hyponatremia sodium 129.  Baseline 137-139. -Hydrated with normal saline for brief time -12/28--started D5W>>Na slowly improving -12/29 increase rate D5W to 75 cc/hr -12/30 improved   Transaminitis AST 198, ALT 183.  Normal T. bili 0.4, ALP 73.   -No abdominal pain.  CT abdomen and pelvis without focal liver abnormality, status postcholecystectomy no biliary dilatation.  -Trend for now likely from initial hypotension,  -LFTs trending down>>improved now -Hold Lipitor -Per Care Everywhere recent hepatitis B- 09/2023 nonreactive. - Check Hep C RNA--neg - outpatient GI follow up with Dr. Servando Snare [facility] GI    Fall at home, initial encounter Likely secondary to encephalopathy, opiates, hypotension/dehydration.  CT chest shows left 10th and 11th rib fractures, suggest pulmonary contusion/atelectasis.  He reports a chronic cough and some dyspnea prior to arrival in the ED. Not hypoxic with O2 sats 98% on room air currently.  Afebrile.  WBC 5.  Lactic acid 2.1 - Most likely pulmonary contusion, IV ceftriaxone and azithromycin started in ED, continue for now with low threshold to discontinue -PT eval recommending CIR - placed order for CIR evaluation, CIR declined patient; TOC working on SNF   -fall precautions    Acute kidney injury superimposed on CKD stage 3b  -Cr-5.38 at the time of admission -secondary to sepsis and volume depletion -possibly secondary to suprapubic catheter obstruction which is now relieved and urine flowing freely  -baseline creatinine  1.8-2.1 -serum creatinine 1.64 on day of d/c   Oropharynx cancer  Completed chemoradiation therapy.  Follows with [facility] oncology.   OSA (obstructive sleep apnea) Resume CPAP nightly   Diabetes mellitus with stage 3 chronic kidney disease  -12/29 A1C--7.8 -increase semglee to 12 units - SSi- S   Essential hypertension, benign Presented with hypotension but now having rebound high blood pressures. -d/c lisinopril in setting of AKI -Resumed clonidine 0.2 mg TID  -started amlodipine 5 mg daily -IV hydralazine PRN   BPH (benign prostatic hyperplasia) Chronic suprapubic catheter.  Last changed per spouse about 2 weeks ago.  Follows with urology.         Consultants: palliative Procedures performed: none  Disposition: Home Diet recommendation:  Dysphagia type 2 NTL Liquid DISCHARGE MEDICATION: Allergies as of 11/14/2023       Reactions   Codeine Rash        Medication List     STOP taking these medications    atorvastatin 40 MG tablet Commonly known as: LIPITOR   fluconazole 200 MG tablet Commonly known as: DIFLUCAN   Jardiance 10 MG Tabs tablet Generic drug: empagliflozin   lisinopril 20 MG tablet Commonly known as: ZESTRIL   mirabegron ER 50 MG Tb24 tablet Commonly known as: MYRBETRIQ       TAKE these medications    amitriptyline 25 MG tablet Commonly known as: ELAVIL Take 25 mg by mouth at bedtime.   amLODipine 5 MG tablet Commonly known as:  NORVASC Take 1 tablet (5 mg total) by mouth daily. Start taking on: November 15, 2023   aspirin EC 81 MG tablet Take 81 mg by mouth daily.   cefdinir 300 MG capsule Commonly known as: OMNICEF Take 1 capsule (300 mg total) by mouth every 12 (twelve) hours.   cloNIDine 0.2 MG tablet Commonly known as: CATAPRES Take 1 tablet (0.2 mg total) by mouth 3 (three) times daily. What changed: when to take this   DULoxetine 20 MG capsule Commonly known as: CYMBALTA Take 20 mg by mouth daily.   feeding  supplement Liqd Take 237 mLs by mouth 3 (three) times daily between meals.   gabapentin 300 MG capsule Commonly known as: NEURONTIN Take 300 mg by mouth 3 times/day as needed-between meals & bedtime.   Lantus SoloStar 100 UNIT/ML Solostar Pen Generic drug: insulin glargine Inject 20 Units into the skin 2 (two) times daily. What changed: how much to take   lidocaine 2 % solution Commonly known as: XYLOCAINE Mix equal parts diphenhydramine, lidocaine, and liquid antacid. Swish 5 to in mouth for 60 seconds, every 4 to 6 hours as needed, then swallow or spit mixture out (as directed by prescriber). Shake well before using. Refrigerate after mixing and discard after 14 days.   linaclotide 290 MCG Caps capsule Commonly known as: LINZESS Take 1 capsule (290 mcg total) by mouth daily before breakfast.   metoprolol tartrate 25 MG tablet Commonly known as: LOPRESSOR Take 0.5 tablets (12.5 mg total) by mouth 2 (two) times daily.   mometasone 0.1 % cream Commonly known as: ELOCON Apply 1 Application topically 2 (two) times daily.   Mounjaro 15 MG/0.5ML Pen Generic drug: tirzepatide Inject 15 mg into the skin once a week.   ondansetron 8 MG tablet Commonly known as: ZOFRAN Take 8 mg by mouth every 8 (eight) hours as needed for nausea or vomiting.   oxyCODONE 5 MG immediate release tablet Commonly known as: Oxy IR/ROXICODONE Take 10 mg by mouth every 4 (four) hours as needed for moderate pain (pain score 4-6).   oxyCODONE ER 9 MG C12a Take 1 capsule by mouth every 12 (twelve) hours as needed (pain).   pantoprazole 40 MG tablet Commonly known as: PROTONIX Take 1 tablet by mouth daily.   prochlorperazine 10 MG tablet Commonly known as: COMPAZINE Take 10 mg by mouth every 8 (eight) hours as needed for vomiting or nausea.   sodium bicarbonate 650 MG tablet Take 1 tablet (650 mg total) by mouth 2 (two) times daily.   Sodium Fluoride 5000 PPM 1.1 % Crea dental cream Generic  drug: sodium fluoride Place 1 Application onto teeth at bedtime.   topiramate 100 MG tablet Commonly known as: TOPAMAX Take 100 mg by mouth 2 (two) times daily.       ASK your doctor about these medications    doxycycline 100 MG capsule Commonly known as: VIBRAMYCIN Take 1 capsule (100 mg total) by mouth 2 (two) times daily for 3 days. Ask about: Should I take this medication?        Follow-up Information     Midge Minium, MD. Schedule an appointment as soon as possible for a visit in 2 week(s).   Specialty: Gastroenterology Why: Hospital Follow Up Contact information: [address] [place]  [place] [address] [phone]         Randa Lynn, MD. Schedule an appointment as soon as possible for a visit in 2 week(s).   Specialty: Nephrology Why: Hospital Follow Up  Contact information: [address] [place] [address] [phone]         Chyrl Civatte, FNP Follow up in 1 week(s).   Specialty: Family Medicine Why: Hospital Follow Up Contact information: [address] [place] [address] [phone]         Care, [facility] Follow up.   Specialty: Home Health Services Why: [facility] will call to schedule PT/OT visits at home Contact information: [address] [place] [address] [phone]                Discharge Exam: There were no vitals filed for this visit. HEENT:  [place]/AT, No thrush, no icterus CV:  RRR, no rub, no S3, no S4 Lung:  bibasilar rales. No wheeze Abd:  soft/+BS, NT Ext:  No edema, no lymphangitis, no synovitis, no rash   Condition at discharge: stable  The results of significant diagnostics from this hospitalization (including imaging, microbiology, ancillary and laboratory) are listed below for reference.   Imaging Studies: DG Swallowing Func-Speech Pathology Result Date: 11/11/2023 Table formatting from the original result was not included.  Modified Barium Swallow Study Patient Details Name: Brian Nielsen MRN: [id number] Date of Birth: 01/11/1961 Today's Date: 11/11/2023 HPI/PMH: HPI: 62 y.o. male with medical history significant for HTN, DM, CKD3, head and neck cancer, anemia, suprapubic catheter.  Patient presented to the ED with complaints of a fall and confusion.   On my evaluation patient is awake alert but fidgety, able to answer simple questions, he remembers falling but not give a lot of history.  Spouse and son who are at bedside.  Confusion started about a week ago, with patient saying things that did not make sense, they thought this was related to patient's pain medication - they are not sure exactly how much he was taking but they believe he was taking more than prescribed.  He fell about a week ago, and then fell again yesterday.  They report yesterday he was standing and weaving about the bed and fell.  Patient denies dizziness, denies chest pain no difficulty breathing, but he is having pain to his left side of his chest.  They report a chronic productive cough that is unchanged related to his throat cancer and treatment. Spouse reports some initial difficulty breathing today on EMS arrival, but this had resolved on arrival to the ED.   He is on ulcers in his mouth, but the report reports stable oral intake.  No vomiting.  He is constipated.  No fevers. Right tonsil HPV+ SCC. Started radiation 11/8. The total radiation dose will be 7000 cGy at 200 cGy/fraction for a total of 35 fractions, treated once a day. Chemotherapy administered concurrently. Plan for tx completion 12/20. BSE complete and MBSS recommended Clinical Impression: Clinical Impression: Pt presents with moderate sensorimotor oropharyngeal dysphagia in the setting of post radiation therapy. Note Pt's mentation is currently altered and he is unable to follow commands without max cues/support. He also requires min/mod support for feeding. Oral stage is characterized by slow  disorganized oral stage followed by premature spillage of liquids a delayed swallowing trigger and occasional silent aspiration of thin liquids before the swallow. Mod amounts of aspiration observed with sequential straw sips of thin liquid which resulted in a reflexive hard cough that was somewhat effective in clearing aspirates. Cued cough for silent aspiration was effective X1 for clearing some aspirates from the airway but majority of the time  Pt was unable to produce a cough or it was produced but ineffective for clearing aspirates. Note decreased pharyngeal stripping wave, decreased epiglottic deflection and decreased laryngeal vestibule closure. Nectar thick liquids resulted in increased pharyngeal residue in diffuse locations (valleculae, posterior wall, pyriforms, aryepiglotic folds); residue was only slightly cleared with repeat swallows. Note occasional flash penetration of NTL however, no aspiraiton of NTL was visualized. HTL, puree and regular textures were consumed with prolonged oral stage, delayed swallowing trigger and pharyngeal residue however note good laryngeal vestibule closure and airway protection with these textures. Recommend continue with D2/fine chop diet and downgrade liquids to NECTAR thick liquids. Presented barium tablet and Pt masticated tablet prior to swallowing; recommend crush meds and administer with puree. Recommend free water protocol: water only after oral care, in between meals. Recommend strategies re: alternate bites/sips, repeat dry swallows after each bite/sip as Pt is able. ST will continue to follow acutely and will benefit from ST f/u after d/c. Factors that may increase risk of adverse event in presence of aspiration Rubye Oaks & Clearance Coots 2021): Factors that may increase risk of adverse event in presence of aspiration Rubye Oaks & Clearance Coots 2021): Frail or deconditioned; Weak cough Recommendations/Plan: Swallowing Evaluation Recommendations Swallowing Evaluation  Recommendations Recommendations: PO diet PO Diet Recommendation: Dysphagia 2 (Finely chopped); Mildly thick liquids (Level 2, nectar thick) Liquid Administration via: Cup; Straw Medication Administration: Whole meds with puree Supervision: Patient able to self-feed; Staff to assist with self-feeding; Full assist for feeding; Set-up assistance for safety; Full supervision/cueing for swallowing strategies Swallowing strategies  : Slow rate; Small bites/sips; Multiple dry swallows after each bite/sip Postural changes: Position pt fully upright for meals Oral care recommendations: Oral care BID (2x/day); Oral care before ice chips/water Caregiver Recommendations: Avoid jello, ice cream, thin soups, popsicles; Remove water pitcher Treatment Plan Treatment Plan Treatment recommendations: Therapy as outlined in treatment plan below Follow-up recommendations: Outpatient SLP Functional status assessment: Patient has had a recent decline in their functional status and demonstrates the ability to make significant improvements in function in a reasonable and predictable amount of time. Treatment frequency: Min 2x/week Treatment duration: 1 week Interventions: Aspiration precaution training; Oropharyngeal exercises; Compensatory techniques; Patient/family education; Trials of upgraded texture/liquids; Diet toleration management by SLP; Respiratory muscle strength training Recommendations Recommendations for follow up therapy are one component of a multi-disciplinary discharge planning process, led by the attending physician.  Recommendations may be updated based on patient status, additional functional criteria and insurance authorization. Assessment: Orofacial Exam: Orofacial Exam Oral Cavity: Oral Hygiene: Xerostomia Oral Cavity - Dentition: Adequate natural dentition; Poor condition Anatomy: Anatomy: WFL Boluses Administered: Boluses Administered Boluses Administered: Thin liquids (Level 0); Mildly thick liquids (Level 2,  nectar thick); Moderately thick liquids (Level 3, honey thick); Puree; Solid  Oral Impairment Domain: Oral Impairment Domain Lip Closure: Interlabial escape, no progression to anterior lip Tongue control during bolus hold: Escape to lateral buccal cavity/floor of mouth Bolus preparation/mastication: Slow prolonged chewing/mashing with complete recollection; Disorganized chewing/mashing with solid pieces of bolus unchewed Bolus transport/lingual motion: Slow tongue motion; Repetitive/disorganized tongue motion Oral residue: Residue collection on oral structures Location of oral residue : Floor of mouth; Tongue; Palate Initiation of pharyngeal swallow : Pyriform sinuses; Posterior laryngeal surface of the epiglottis; Valleculae; Posterior angle of the ramus  Pharyngeal Impairment Domain: Pharyngeal Impairment Domain Soft palate elevation: No bolus between soft palate (SP)/pharyngeal wall (PW) Laryngeal elevation: Partial superior movement of thyroid cartilage/partial approximation of arytenoids to epiglottic petiole Anterior hyoid excursion: Partial anterior movement Epiglottic  movement: Partial inversion Laryngeal vestibule closure: Incomplete, narrow column air/contrast in laryngeal vestibule Pharyngeal stripping wave : Present - diminished Pharyngeal contraction (A/P view only): N/A Pharyngoesophageal segment opening: Partial distention/partial duration, partial obstruction of flow Tongue base retraction: Trace column of contrast or air between tongue base and PPW Pharyngeal residue: Collection of residue within or on pharyngeal structures Location of pharyngeal residue: Valleculae; Pharyngeal wall; Aryepiglottic folds; Pyriform sinuses; Diffuse (>3 areas)  Esophageal Impairment Domain: No data recorded Pill: Pill Consistency administered: Thin liquids (Level 0) Penetration/Aspiration Scale Score: Penetration/Aspiration Scale Score 5.  Material enters airway, CONTACTS cords and not ejected out: Thin liquids (Level  0) 6.  Material enters airway, passes BELOW cords then ejected out: Thin liquids (Level 0) 7.  Material enters airway, passes BELOW cords and not ejected out despite cough attempt by patient: Thin liquids (Level 0) 8.  Material enters airway, passes BELOW cords without attempt by patient to eject out (silent aspiration) : Thin liquids (Level 0) Compensatory Strategies: Compensatory Strategies Compensatory strategies: No   General Information: Caregiver present: No  Diet Prior to this Study: Dysphagia 2 (finely chopped); Thin liquids (Level 0)   Temperature : Normal   Respiratory Status: WFL   No data recorded  No data recorded Behavior/Cognition: Alert; Cooperative; Pleasant mood; Confused; Requires cueing; Doesn't follow directions Self-Feeding Abilities: Able to self-feed; Needs set-up for self-feeding; Needs assist with self-feeding Baseline vocal quality/speech: Hypophonia/low volume No data recorded Volitional Swallow: Able to elicit No data recorded Goal Planning: Prognosis for improved oropharyngeal function: Fair Barriers to Reach Goals: Severity of deficits No data recorded Patient/Family Stated Goal: N/A No data recorded Pain: Pain Assessment Pain Assessment: Faces Faces Pain Scale: 4 Breathing: 0 Negative Vocalization: 1 Facial Expression: 2 Body Language: 1 Consolability: 1 PAINAD Score: 5 Pain Location: "throat" Pain Descriptors / Indicators: Grimacing; Guarding Pain Intervention(s): Limited activity within patient's tolerance; Monitored during session End of Session: Start Time:SLP Start Time (ACUTE ONLY): 1156 Stop Time: SLP Stop Time (ACUTE ONLY): 1223 Time Calculation:SLP Time Calculation (min) (ACUTE ONLY): 27 min Charges: SLP Evaluations $ SLP Speech Visit: 1 Visit SLP Evaluations $MBS Swallow: 1 Procedure $Swallowing Treatment: 1 Procedure SLP visit diagnosis: SLP Visit Diagnosis: Dysphagia, unspecified (R13.10) Past Medical History: Past Medical History: Diagnosis Date  Adult celiac disease 2015   followed by dr Servando Snare (GI)  Anemia associated with chronic renal failure   hematologist--- dr Sarajane Jews;  treated with iron infusions  Benign localized prostatic hyperplasia with lower urinary tract symptoms (LUTS)   urologist--- dr Retta Diones  Cancer [facility]) 08/18/2023  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  Charcot foot due to diabetes mellitus (HCC)   Chronic constipation   CKD (chronic kidney disease), stage III [facility])   nephrologist--- dr Wolfgang Phoenix;  renal bx 11-20-2021  Diverticulosis of colon   Edema of both lower extremities   GERD (gastroesophageal reflux disease)   History of adenomatous polyp of colon   HTN (hypertension)   Hyperlipidemia, mixed   Peripheral neuropathy   Peyronie's disease   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 meatal stricture   Retinopathy due to secondary diabetes mellitus (HCC)   both eyes  treated with injecitons  Thoracic spondylosis without myelopathy 11/22/2016  Type 2 diabetes mellitus [facility])   endocrinologist--- dr nida  Vitamin D deficiency   Wears hearing aid in both ears  Past Surgical History: Past Surgical History: Procedure Laterality Date  BIOPSY N/A 09/07/2021  Procedure: BIOPSY;  Surgeon: Midge Minium, MD;  Location: [facility];  Service: Endoscopy;  Laterality: N/A;  CATARACT EXTRACTION W/ INTRAOCULAR LENS IMPLANT Bilateral 2023  COLONOSCOPY N/A 05/03/2014  Procedure: COLONOSCOPY;  Surgeon: West Bali, MD;  Location: [facility];  Service: Endoscopy;  Laterality: N/A;  12:00  COLONOSCOPY WITH PROPOFOL N/A 09/07/2021  Procedure: COLONOSCOPY WITH PROPOFOL;  Surgeon: Midge Minium, MD;  Location: [facility];  Service: Endoscopy;  Laterality: N/A;  CYSTOSCOPY WITH INJECTION N/A 07/26/2023  Procedure: CYSTOSCOPY WITH BOTOX INJECTION 100 UNITS;  Surgeon: Noel Christmas, MD;  Location: [facility] OR[facility];  Service: Urology;  Laterality: N/A;  30 minutes  CYSTOSCOPY WITH URETHRAL DILATATION N/A 01/10/2023  Procedure: CYSTOSCOPY;  Surgeon: Marcine Matar, MD;   Location: [facility];  Service: Urology;  Laterality: N/A;  ESOPHAGOGASTRODUODENOSCOPY N/A 05/03/2014  Procedure: ESOPHAGOGASTRODUODENOSCOPY (EGD);  Surgeon: West Bali, MD;  Location: [facility];  Service: Endoscopy;  Laterality: N/A;  ESOPHAGOGASTRODUODENOSCOPY (EGD) WITH PROPOFOL N/A 06/03/2020  Procedure: ESOPHAGOGASTRODUODENOSCOPY (EGD) WITH PROPOFOL;  Surgeon: Regis Bill, MD;  Location: [facility] ENDOSCOPY;  Service: Endoscopy;  Laterality: N/A;  ESOPHAGOGASTRODUODENOSCOPY (EGD) WITH PROPOFOL N/A 09/07/2021  Procedure: ESOPHAGOGASTRODUODENOSCOPY (EGD) WITH PROPOFOL;  Surgeon: Midge Minium, MD;  Location: [facility];  Service: Endoscopy;  Laterality: N/A;  Diabetic  IR BONE MARROW BIOPSY & ASPIRATION  04/27/2023  MEATOTOMY N/A 01/10/2023  Procedure: MEATOTOMY ADULT;  Surgeon: Marcine Matar, MD;  Location: [facility];  Service: Urology;  Laterality: N/A;  PARS PLANA VITRECTOMY Right 03/21/2021  Procedure: PARS PLANA VITRECTOMY 25 GAUGE WITH INJECTION OF ANTIBIOTICS FOR ENDOPHTHALMITIS;  Surgeon: Carmela Rima, MD;  Location: [facility] OR;  Service: Ophthalmology;  Laterality: Right;  POLYPECTOMY N/A 09/07/2021  Procedure: POLYPECTOMY;  Surgeon: Midge Minium, MD;  Location: [facility];  Service: Endoscopy;  Laterality: N/A;  REPAIR EXTENSOR TENDON Right 08/11/2022  Procedure: EXTENSOR CARPI ULNARIS TENDON DEBRIDEMENT, POSSIBLE SUBSHEATH RECONSTRUCTION;  Surgeon: Marlyne Beards, MD;  Location: [facility];  Service: Orthopedics;  Laterality: Right;  or regional plus MAC Amelia H. Romie Levee, CCC-SLP Speech Language Pathologist Georgetta Haber 11/11/2023, 2:52 PM  CT HEAD WO CONTRAST ( ) Result Date: 11/06/2023 CLINICAL DATA:  Patient fell 2 days ago. History of head neck cancer. Encephalopathy. EXAM: CT HEAD WITHOUT CONTRAST TECHNIQUE: Contiguous axial images were obtained from the base of the skull through the vertex without  intravenous contrast. RADIATION DOSE REDUCTION: This exam was performed according to the departmental dose-optimization program which includes automated exposure control, adjustment of the mA and/or kV according to patient size and/or use of iterative reconstruction technique. COMPARISON:  05/12/2021 FINDINGS: Brain: There is no evidence for acute hemorrhage, hydrocephalus, mass lesion, or abnormal extra-axial fluid collection. No definite CT evidence for acute infarction. Vascular: No hyperdense vessel or unexpected calcification. Skull: No evidence for fracture. No worrisome lytic or sclerotic lesion. Sinuses/Orbits: The visualized paranasal sinuses and mastoid air cells are clear. Visualized portions of the globes and intraorbital fat are unremarkable. Other: None. IMPRESSION: No acute intracranial abnormality. Electronically Signed   By: Kennith Center M.D.   On: 11/06/2023 16:29   DG Pelvis Portable Result Date: 11/06/2023 CLINICAL DATA:  Fall. EXAM: PORTABLE PELVIS 1-2 VIEWS COMPARISON:  Same day CT chest, abdomen, and pelvis. FINDINGS: There is no evidence of pelvic fracture or diastasis. The sacroiliac joints and pubic symphysis appear anatomically aligned. No pelvic bone lesions are seen. IMPRESSION: No acute osseous abnormality identified on AP pelvic radiograph. Electronically Signed   By: Hart Robinsons M.D.   On: 11/06/2023 14:09   DG Chest  Port 1 View Result Date: 11/06/2023 CLINICAL DATA:  Fall. EXAM: PORTABLE CHEST 1 VIEW COMPARISON:  Same day CT chest, abdomen, and pelvis. Chest radiograph dated February 03, 2023. FINDINGS: Low lung volumes with accentuation of the cardiomediastinal silhouette. Overlapping telemetry wires. Patchy opacities at the left lung base. Blunting of the left costophrenic angle. The right lung appears clear. No pneumothorax. Posterior left tenth and eleventh rib fractures are not well visualized on this exam and better evaluated on the same-day CT chest, abdomen, and  pelvis. IMPRESSION: 1. Posterior left tenth and eleventh rib fractures are not well visualized on this exam and better evaluated on the same-day CT chest, abdomen, and pelvis. No pneumothorax. 2. Patchy opacities at the left lung base could reflect contusion, infiltrate, or atelectasis. 3. Probable trace left effusion. Electronically Signed   By: Hart Robinsons M.D.   On: 11/06/2023 14:06   CT CHEST ABDOMEN PELVIS WO CONTRAST Result Date: 11/06/2023 CLINICAL DATA:  Fall 2 days ago. Blunt trauma. New onset hypoxia. Rib pain. EXAM: CT CHEST, ABDOMEN AND PELVIS WITHOUT CONTRAST TECHNIQUE: Multidetector CT imaging of the chest, abdomen and pelvis was performed following the standard protocol without IV contrast. RADIATION DOSE REDUCTION: This exam was performed according to the departmental dose-optimization program which includes automated exposure control, adjustment of the mA and/or kV according to patient size and/or use of iterative reconstruction technique. COMPARISON:  CT AP from 10/15/2022 FINDINGS: CT CHEST FINDINGS Cardiovascular: Heart size appears mildly enlarged. There is no pericardial effusion. Aortic atherosclerotic calcifications and coronary artery calcifications. Mediastinum/Nodes: No enlarged mediastinal, hilar, or axillary lymph nodes. Thyroid gland, trachea, and esophagus demonstrate no significant findings. Lungs/Pleura: Small volume of pleural fluid is identified within the left base the fluid measures water density. Lingular atelectasis. In the left lower lobe there are multifocal areas of subsegmental atelectasis, ground-glass attenuation, and posterior consolidative change. No pneumothorax identified. Musculoskeletal: There are acute fractures involving the posterior aspect of the left tenth and eleventh ribs posteriorly, image 53/2 and image 58/2. The thoracic vertebral body heights are well preserved without signs of acute fracture. Intact sternum. CT ABDOMEN PELVIS FINDINGS  Hepatobiliary: No focal liver abnormality is seen. Status post cholecystectomy. No biliary dilatation. Pancreas: Unremarkable. No pancreatic ductal dilatation or surrounding inflammatory changes. Spleen: No splenic injury or perisplenic hematoma. Adrenals/Urinary Tract: No adrenal hemorrhage or renal injury identified. The bladder is partially decompressed around a suprapubic catheter. Stomach/Bowel: Stomach is unremarkable. No pathologic dilatation of the large or small bowel loops. The appendix is visualized and is normal. There is a moderate to large stool burden identified within the ascending colon. No bowel wall thickening or inflammatory changes identified Vascular/Lymphatic: Aortic atherosclerosis. No enlarged abdominal or pelvic lymph nodes. Reproductive: Prostate is unremarkable. Other: No free fluid or fluid collections. Small fat containing umbilical hernia. Musculoskeletal: No fracture is seen. IMPRESSION: 1. Acute fractures involving the posterior aspect of the left tenth and eleventh ribs posteriorly. 2. Small volume of pleural fluid is identified within the left base. The fluid measures water density. No pneumothorax identified. 3. Multifocal areas of subsegmental atelectasis, ground-glass attenuation, and posterior consolidative change within the left lower lobe. Findings may reflect pulmonary contusion and/or atelectasis. 4. No acute findings within the abdomen or pelvis. 5. Moderate to large stool burden identified within the ascending colon. Correlate for any clinical signs or symptoms of constipation. 6. Coronary artery calcifications. 7.  Aortic Atherosclerosis (ICD10-I70.0). Electronically Signed   By: Signa Kell M.D.   On: 11/06/2023 13:56  Microbiology: Results for orders placed or performed during the hospital encounter of 11/06/23  Urine Culture (for pregnant, neutropenic or urologic patients or patients with an indwelling urinary catheter)     Status: Abnormal   Collection  Time: 11/06/23  1:22 PM   Specimen: Urine, Clean Catch  Result Value Ref Range Status   Specimen Description   Final    URINE, CLEAN CATCH Performed at [facility], [address], [place], [place] [address]    Special Requests   Final    NONE Performed at [facility], [address] [place], [place] [address]    Culture >=100,000 COLONIES/mL KLEBSIELLA PNEUMONIAE (A)  Final   Report Status 11/09/2023 FINAL  Final   Organism ID, Bacteria KLEBSIELLA PNEUMONIAE (A)  Final      Susceptibility   Klebsiella pneumoniae - MIC*    AMPICILLIN RESISTANT Resistant     CEFAZOLIN <=4 SENSITIVE Sensitive     CEFEPIME <=0.12 SENSITIVE Sensitive     CEFTRIAXONE <=0.25 SENSITIVE Sensitive     CIPROFLOXACIN <=0.25 SENSITIVE Sensitive     GENTAMICIN <=1 SENSITIVE Sensitive     IMIPENEM <=0.25 SENSITIVE Sensitive     NITROFURANTOIN 32 SENSITIVE Sensitive     TRIMETH/SULFA <=20 SENSITIVE Sensitive     AMPICILLIN/SULBACTAM 4 SENSITIVE Sensitive     PIP/TAZO <=4 SENSITIVE Sensitive ug/mL    * >=100,000 COLONIES/mL KLEBSIELLA PNEUMONIAE    Labs: CBC: Recent Labs  Lab 11/11/23 0422 11/12/23 0449  WBC 4.7 4.9  HGB 7.8* 8.3*  HCT 26.1* 27.6*  MCV 95.3 96.5  PLT 149* 171   Basic Metabolic Panel: Recent Labs  Lab 11/08/23 0355 11/09/23 0427 11/11/23 0422 11/12/23 0449 11/13/23 0430 11/14/23 0419  NA 138 142 147* 149* 148* 140  K 3.7 3.6 3.3* 3.2* 3.4* 3.5  CL 109 114* 120* 124* 120* 117*  CO2 18* 20* 19* 20* 20* 18*  GLUCOSE 87 173* 272* 235* 265* 242*  BUN 64* 52* 57* 56* 44* 40*  CREATININE 3.36* 2.50* 2.25* 1.98* 1.77* 1.64*  CALCIUM 8.3* 8.6* 8.3* 8.2* 8.3* 8.2*  MG  --   --  2.6*  --  2.1  --   PHOS 3.6  --  4.2  --   --   --    Liver Function Tests: Recent Labs  Lab 11/08/23 0354 11/08/23 0355 11/12/23 0449  AST 65*  --  13*  ALT 116*  --  40  ALKPHOS 77  --  82  BILITOT 0.3  --  0.7  PROT 5.2*  --  5.6*  ALBUMIN 1.9* 1.9* 2.0*   CBG: Recent Labs  Lab  11/13/23 1106 11/13/23 1645 11/13/23 2020 11/14/23 0302 11/14/23 0751  GLUCAP 322* 219* 226* 209* 231*    Discharge time spent: greater than 30 minutes.  Signed: Catarina Hartshorn, MD [facility] 11/14/2023

## 2023-11-14 NOTE — TOC Transition Note (Addendum)
Transition of Care [facility]) - Discharge Note   Patient Details  Name: Brian Nielsen MRN: [id number] Date of Birth: 12-15-1960  [facility]) CM/SW Contact:  Karn Cassis, LCSW Phone Number: 11/14/2023, 11:35 AM   Clinical Narrative: Pt d/c today. Pt's wife requests 3N1. No preference on agency. Referred and accepted by [facility] with [employer] to drop ship to home. Cindie with [facility] aware of d/c and home health orders are in.     Update: Wife now wants rolling walker. [employer] notified and will drop ship.    Final next level of care: Home w Home Health Services Barriers to Discharge: Barriers Resolved   Patient Goals and CMS Choice Patient states their goals for this hospitalization and ongoing recovery are:: return home CMS Medicare.gov Compare Post Acute Care list provided to:: Patient Represenative (must comment) Choice offered to / list presented to : Spouse [place] ownership interest in [facility].provided to:: Spouse    Discharge Placement                    Patient and family notified of of transfer: 11/14/23  Discharge Plan and Services Additional resources added to the After Visit Summary for   In-house Referral: Clinical Social Work   Post Acute Care Choice: Home Health          DME Arranged: 3-N-1 DME Agency: [employer] Date DME Agency Contacted: 11/14/23 Time DME Agency Contacted: 1134 Representative spoke with at DME Agency: [place] HH Arranged: PT, OT HH Agency: [facility] Date [facility] Agency Contacted: 11/11/23   Representative spoke with at [facility] Agency: Cindie  Social Drivers of Health (SDOH) Interventions SDOH Screenings   Food Insecurity: No Food Insecurity (11/06/2023)  Housing: Unknown (11/06/2023)  Transportation Needs: No Transportation Needs (11/06/2023)  Utilities: Not At Risk (11/06/2023)  Financial Resource Strain: Low Risk  (02/08/2023)   Received from [facility], [facility]  Physical  Activity: Patient Declined (02/08/2023)   Received from [facility], [facility]  Social Connections: Socially Integrated (02/08/2023)   [facility] from [facility], [facility]  Stress: No Stress Concern Present (02/08/2023)   Received from [facility]  Tobacco Use: High Risk (11/11/2023)     Readmission Risk Interventions    11/07/2023   11:04 AM  Readmission Risk Prevention Plan  Transportation Screening Complete  PCP or Specialist Appt within 3-5 Days Complete  HRI or Home Care Consult Complete  Social Work Consult for Recovery Care Planning/Counseling Complete  Palliative Care Screening Not Applicable  Medication Review Oceanographer) Complete

## 2023-11-14 NOTE — TOC Progression Note (Signed)
Transition of Care [facility]) - Progression Note    Patient Details  Name: Brian Nielsen MRN: [id number] Date of Birth: 05-09-1961  Transition of Care [facility]) CM/SW Contact  Karn Cassis, [place] Phone Number: 11/14/2023, 8:19 AM  Clinical Narrative:  LCSW followed up with pt's wife regarding recommendation for rolling walker. Pt's wife indicates they will get walker on their own.      Expected Discharge Plan: Home w Home Health Services Barriers to Discharge: Continued Medical Work up  Expected Discharge Plan and Services In-house Referral: Clinical Social Work   Post Acute Care Choice: Home Health Living arrangements for the past 2 months: Single Family Home Expected Discharge Date: 11/08/23                         HH Arranged: PT, OT HH Agency: [facility] Date [facility] Agency Contacted: 11/11/23   Representative spoke with at [facility] Agency: Cindie   Social Determinants of Health (SDOH) Interventions SDOH Screenings   Food Insecurity: No Food Insecurity (11/06/2023)  Housing: Unknown (11/06/2023)  Transportation Needs: No Transportation Needs (11/06/2023)  Utilities: Not At Risk (11/06/2023)  Financial Resource Strain: Low Risk  (02/08/2023)   Received from [facility], [facility]  Physical Activity: Patient Declined (02/08/2023)   Received from [facility], [facility]  Social Connections: Socially Integrated (02/08/2023)   Received from [facility]  Stress: No Stress Concern Present (02/08/2023)   Received from [facility], [facility]  Tobacco Use: High Risk (11/11/2023)    Readmission Risk Interventions    11/07/2023   11:04 AM  Readmission Risk Prevention Plan  Transportation Screening Complete  PCP or Specialist Appt within 3-5 Days Complete  HRI or Home Care Consult Complete  Social Work Consult for Recovery Care Planning/Counseling Complete  Palliative Care Screening Not Applicable  Medication Review Furniture conservator/restorer) Complete

## 2023-11-14 NOTE — Progress Notes (Signed)
Mobility Specialist Progress Note:    11/14/23 1126  Mobility  Activity Transferred from chair to bed  Level of Assistance Minimal assist, patient does 75% or more  Assistive Device None  Distance Ambulated (ft) 2 ft  Range of Motion/Exercises Active;All extremities  Activity Response Tolerated well  Mobility Referral Yes  Mobility visit 1 Mobility  Mobility Specialist Start Time (ACUTE ONLY) 1055  Mobility Specialist Stop Time (ACUTE ONLY) 1105  Mobility Specialist Time Calculation (min) (ACUTE ONLY) 10 min   Pt received requesting assistance transferring to bed, RN in room. Required MinA to stand and pivot with no AD. Tolerated well, pt weak and refused further mobility. Left pt supine, all needs met.   Lawerance Bach Mobility Specialist Please contact via Special educational needs teacher or  Rehab office at [phone]

## 2023-11-14 NOTE — Progress Notes (Signed)
Palliative:   [name] is lying quietly in bed.  He appears acutely/chronically ill and quite frail.  He is resting comfortably, but wakes easily when I enter.  He is alert, able to make his needs known.  There is no family at bedside at this time.  We talked about the plan for discharge home today.  [name] states that he has no questions.  He tells me that he feels that he is ready to go home.  Face-to-face conference with transition of care team related to patient condition, needs, goals of care, disposition.    Plan: At this point continue to treat the treatable.  States would not want long-term life support.  To follow-up with [facility] related to cancer treatments.  Encouraged for outpatient palliative services.  25 minutes  Lillia Carmel, NP Palliative Medicine Team  Team Phone [phone]

## 2023-11-17 LAB — TISSUE TRANSGLUTAMINASE, IGA: Tissue Transglutaminase Ab, IgA: 3 U/mL (ref 0–3)

## 2023-11-18 ENCOUNTER — Encounter: Payer: Self-pay | Admitting: Internal Medicine

## 2023-11-21 ENCOUNTER — Ambulatory Visit: Payer: BC Managed Care – PPO

## 2023-11-21 ENCOUNTER — Inpatient Hospital Stay (HOSPITAL_BASED_OUTPATIENT_CLINIC_OR_DEPARTMENT_OTHER): Payer: BC Managed Care – PPO | Admitting: Nurse Practitioner

## 2023-11-21 ENCOUNTER — Encounter: Payer: Self-pay | Admitting: Nurse Practitioner

## 2023-11-21 ENCOUNTER — Inpatient Hospital Stay: Payer: BC Managed Care – PPO | Attending: Internal Medicine

## 2023-11-21 VITALS — BP 139/69 | HR 75 | Temp 97.6°F | Resp 20 | Wt 165.4 lb

## 2023-11-21 DIAGNOSIS — D631 Anemia in chronic kidney disease: Secondary | ICD-10-CM

## 2023-11-21 DIAGNOSIS — D649 Anemia, unspecified: Secondary | ICD-10-CM | POA: Diagnosis not present

## 2023-11-21 DIAGNOSIS — N183 Chronic kidney disease, stage 3 unspecified: Secondary | ICD-10-CM | POA: Diagnosis present

## 2023-11-21 DIAGNOSIS — Z808 Family history of malignant neoplasm of other organs or systems: Secondary | ICD-10-CM | POA: Insufficient documentation

## 2023-11-21 DIAGNOSIS — E1122 Type 2 diabetes mellitus with diabetic chronic kidney disease: Secondary | ICD-10-CM | POA: Diagnosis present

## 2023-11-21 DIAGNOSIS — N1832 Chronic kidney disease, stage 3b: Secondary | ICD-10-CM

## 2023-11-21 DIAGNOSIS — D509 Iron deficiency anemia, unspecified: Secondary | ICD-10-CM

## 2023-11-21 DIAGNOSIS — Z87891 Personal history of nicotine dependence: Secondary | ICD-10-CM | POA: Diagnosis not present

## 2023-11-21 DIAGNOSIS — I129 Hypertensive chronic kidney disease with stage 1 through stage 4 chronic kidney disease, or unspecified chronic kidney disease: Secondary | ICD-10-CM | POA: Insufficient documentation

## 2023-11-21 LAB — CBC WITH DIFFERENTIAL (CANCER CENTER ONLY)
Abs Immature Granulocytes: 0.03 10*3/uL (ref 0.00–0.07)
Basophils Absolute: 0 10*3/uL (ref 0.0–0.1)
Basophils Relative: 0 %
Eosinophils Absolute: 0.1 10*3/uL (ref 0.0–0.5)
Eosinophils Relative: 1 %
HCT: 23.8 % — ABNORMAL LOW (ref 39.0–52.0)
Hemoglobin: 7.7 g/dL — ABNORMAL LOW (ref 13.0–17.0)
Immature Granulocytes: 1 %
Lymphocytes Relative: 6 %
Lymphs Abs: 0.3 10*3/uL — ABNORMAL LOW (ref 0.7–4.0)
MCH: 29.5 pg (ref 26.0–34.0)
MCHC: 32.4 g/dL (ref 30.0–36.0)
MCV: 91.2 fL (ref 80.0–100.0)
Monocytes Absolute: 0.5 10*3/uL (ref 0.1–1.0)
Monocytes Relative: 10 %
Neutro Abs: 3.9 10*3/uL (ref 1.7–7.7)
Neutrophils Relative %: 82 %
Platelet Count: 197 10*3/uL (ref 150–400)
RBC: 2.61 MIL/uL — ABNORMAL LOW (ref 4.22–5.81)
RDW: 16.4 % — ABNORMAL HIGH (ref 11.5–15.5)
WBC Count: 4.8 10*3/uL (ref 4.0–10.5)
nRBC: 0 % (ref 0.0–0.2)

## 2023-11-21 LAB — IRON AND TIBC
Iron: 35 ug/dL — ABNORMAL LOW (ref 45–182)
Saturation Ratios: 19 % (ref 17.9–39.5)
TIBC: 185 ug/dL — ABNORMAL LOW (ref 250–450)
UIBC: 150 ug/dL

## 2023-11-21 LAB — BASIC METABOLIC PANEL - CANCER CENTER ONLY
Anion gap: 8 (ref 5–15)
BUN: 28 mg/dL — ABNORMAL HIGH (ref 8–23)
CO2: 25 mmol/L (ref 22–32)
Calcium: 8.5 mg/dL — ABNORMAL LOW (ref 8.9–10.3)
Chloride: 103 mmol/L (ref 98–111)
Creatinine: 1.53 mg/dL — ABNORMAL HIGH (ref 0.61–1.24)
GFR, Estimated: 51 mL/min — ABNORMAL LOW (ref >60)
Glucose, Bld: 153 mg/dL — ABNORMAL HIGH (ref 70–99)
Potassium: 3.7 mmol/L (ref 3.5–5.1)
Sodium: 136 mmol/L (ref 135–145)

## 2023-11-21 LAB — FERRITIN: Ferritin: 1225 ng/mL — ABNORMAL HIGH (ref 24–336)

## 2023-11-21 NOTE — Progress Notes (Signed)
 [facility] CONSULT NOTE  Patient Care Team: Adine Duwaine MATSU, FNP as PCP - General (Family Medicine) Orman Maize, NP (Inactive) as Nurse Practitioner (Endocrinology) Rennie Cindy SAUNDERS, MD as Consulting Physician (Oncology)  CHIEF COMPLAINTS/PURPOSE OF CONSULTATION: ANEMIA  HEMATOLOGY HISTORY:  # ANEMIA PROGRESSIVE since 2020 12; 2022- 10 MCV- 90s; colonoscopy 2015; multiple endoscopies-last September 2021-biopsy active celiac disease; MAY 2024- Bone marrow biopsy/aspiration- The bone marrow is generally normocellular for age with trilineage  hematopoiesis and nonspecific changes, likely secondary in nature in  this setting.  There is no morphologic evidence of a lymphoproliferative  process or plasma cell neoplasm.   #CKD stage III [[facility]]/diabetes [[facility]]; ? Celaic disease [[facility]];   - Labs: 07/2014 - tTG IgA 101 03/2020 - positive endomysial Ab IgA, tTG IgA 78 - CSY: 07/2014 - normal appearing terminal ileum, sigmoid diverticulosis, and non-bleeding internal hemorrhoids - EGD: 07/2014 - mild non-erosive gastritis, normal appearing duodenal mucosa with duodenal biopsies showing celiac sprue - EGD: 06/03/2020 - procedure aborted due to presence of food - EGD: 08/12/2020 - irregular Z-line found at [facility] with bx showing mild reflux esophagitis, 1 cm hiatal hernia, mild chronic gastritis, decreased folds found in entire duodenum, flattening found in entire duodenum and scalloped mucosa with bx showing active Celiac disease with villous atrophy, increased intraepithelial lymphocytes, etc. Repeat in 1-year.  HISTORY OF PRESENTING ILLNESS: Alone.  Ambulating independently.  Koren JINNY Learn 63 y.o. male with symptomatic anemia, secondary to CKD, IDA, poorly controlled diabetes, complications of CKD, peripheral neuropathy, and newly diganosed p16 positive tonsillar cancer, currently receiving treatment at [facility]. In interim, he had fall and delirium secondary to  pneumonia, UTI, and was admitted to [facility], now discharged and back at home. Wife accompanies him today. He continues to recover. Generally feels poorly and weak. Has pain due to rib fracture. Continues to loose weight.    Review of Systems  Constitutional:  Positive for malaise/fatigue and weight loss. Negative for chills, diaphoresis and fever.  HENT:  Negative for nosebleeds and sore throat.   Eyes:  Negative for double vision.  Respiratory:  Positive for shortness of breath. Negative for cough, hemoptysis, sputum production and wheezing.   Cardiovascular:  Negative for chest pain, palpitations, orthopnea and leg swelling.  Gastrointestinal:  Negative for blood in stool, heartburn, melena, nausea and vomiting.  Genitourinary:  Negative for dysuria, frequency and urgency.  Musculoskeletal:  Positive for back pain, falls and joint pain.  Skin: Negative.  Negative for itching and rash.  Neurological:  Positive for weakness. Negative for dizziness, tingling, focal weakness and headaches.  Endo/Heme/Allergies:  Does not bruise/bleed easily.  Psychiatric/Behavioral:  Positive for depression. The patient has insomnia. The patient is not nervous/anxious.     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   Charcot foot due to diabetes mellitus (HCC)    Chronic constipation    CKD (chronic kidney disease), stage III [facility])    nephrologist--- dr rachele;  renal bx 11-20-2021   Diverticulosis of colon    Edema of both lower extremities    GERD (gastroesophageal reflux disease)  History of adenomatous polyp of colon    HTN  (hypertension)    Hyperlipidemia, mixed    Peripheral neuropathy    Peyronie's disease    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 meatal stricture    Retinopathy due to secondary diabetes mellitus (HCC)    both eyes  treated with injecitons   Thoracic spondylosis without myelopathy 11/22/2016   Type 2 diabetes mellitus [facility])    endocrinologist--- dr nida   Vitamin D  deficiency    Wears hearing aid in both ears     SURGICAL HISTORY: Past Surgical History:  Procedure Laterality Date   BIOPSY N/A 09/07/2021   Procedure: BIOPSY;  Surgeon: Jinny Carmine, MD;  Location: [facility];  Service: Endoscopy;  Laterality: N/A;   CATARACT EXTRACTION W/ INTRAOCULAR LENS IMPLANT Bilateral 2023   COLONOSCOPY N/A 05/03/2014   Procedure: COLONOSCOPY;  Surgeon: Margo LITTIE Haddock, MD;  Location[facility];  Service: Endoscopy;  Laterality: N/A;  12:00   COLONOSCOPY WITH PROPOFOL  N/A 09/07/2021   Procedure: COLONOSCOPY WITH PROPOFOL ;  Surgeon: Jinny Carmine, MD;  Location: [facility];  Service: Endoscopy;  Laterality: N/A;   CYSTOSCOPY WITH INJECTION N/A 07/26/2023   Procedure: CYSTOSCOPY WITH BOTOX  INJECTION 100 UNITS;  Surgeon: Elisabeth Valli BIRCH, MD;  Location: [facility];  Service: Urology;  Laterality: N/A;  30 minutes   CYSTOSCOPY WITH URETHRAL DILATATION N/A 01/10/2023   Procedure: CYSTOSCOPY;  Surgeon: Matilda Senior, MD;  Location: [facility];  Service: Urology;  Laterality: N/A;   ESOPHAGOGASTRODUODENOSCOPY N/A 05/03/2014   Procedure: ESOPHAGOGASTRODUODENOSCOPY (EGD);  Surgeon: Margo LITTIE Haddock, MD;  Location[facility];  Service: Endoscopy;  Laterality: N/A;   ESOPHAGOGASTRODUODENOSCOPY (EGD) WITH PROPOFOL  N/A 06/03/2020   Procedure: ESOPHAGOGASTRODUODENOSCOPY (EGD) WITH PROPOFOL ;  Surgeon: Maryruth Ole DASEN, MD;  Location: [facility];  Service: Endoscopy;  Laterality: N/A;   ESOPHAGOGASTRODUODENOSCOPY (EGD) WITH PROPOFOL  N/A 09/07/2021   Procedure:  ESOPHAGOGASTRODUODENOSCOPY (EGD) WITH PROPOFOL ;  Surgeon: Jinny Carmine, MD;  Location: [facility];  Service: Endoscopy;  Laterality: N/A;  Diabetic   IR BONE MARROW BIOPSY & ASPIRATION  04/27/2023   MEATOTOMY N/A 01/10/2023   Procedure: MEATOTOMY ADULT;  Surgeon: Matilda Senior, MD;  Location[facility];  Service: Urology;  Laterality: N/A;   PARS PLANA VITRECTOMY Right 03/21/2021   Procedure: PARS PLANA VITRECTOMY 25 GAUGE WITH INJECTION OF ANTIBIOTICS FOR ENDOPHTHALMITIS;  Surgeon: Tobie Baptist, MD;  Location: [facility] OR;  Service: Ophthalmology;  Laterality: Right;   POLYPECTOMY N/A 09/07/2021   Procedure: POLYPECTOMY;  Surgeon: Jinny Carmine, MD;  Location: [facility];  Service: Endoscopy;  Laterality: N/A;   REPAIR EXTENSOR TENDON Right 08/11/2022   Procedure: EXTENSOR CARPI ULNARIS TENDON DEBRIDEMENT, POSSIBLE SUBSHEATH RECONSTRUCTION;  Surgeon: Romona Harari, MD;  Location: [facility];  Service: Orthopedics;  Laterality: Right;  or regional plus MAC    SOCIAL HISTORY: Social History   Socioeconomic History   Marital status: Married    Spouse name: [name]   Number of children: 1   Years of education: 12   Highest education level: Not on file  Occupational History   Occupation: Architect: [employer]  Tobacco Use   Smoking status: Never   Smokeless tobacco: Current    Types: Snuff  Vaping Use   Vaping status: Never Used  Substance and Sexual Activity   Alcohol use: Not Currently    Comment: seldom   Drug use: Never   Sexual activity: Yes    Birth  control/protection: Surgical  Other Topics Concern   Not on file  Social History Narrative   Lives with wife   [place]   Caffeine- diet Mtn Dew, 2 glasses   Works as scientist, water quality for [employer]   Social Drivers of Health   Financial Resource Strain: Low Risk  (02/08/2023)   Received from [employer], [facility]   Overall Financial Resource Strain (CARDIA)    Difficulty of Paying Living Expenses: Not hard at all  Food Insecurity: No Food Insecurity (11/06/2023)   Hunger Vital Sign    Worried About Running Out of Food in the Last Year: Never true    Ran Out of Food in the Last Year: Never true  Transportation Needs: No Transportation Needs (11/06/2023)   PRAPARE - Administrator, Civil Service (Medical): No    Lack of Transportation (Non-Medical): No  Physical Activity: Patient Declined (02/08/2023)   Received from [facility], [facility]   Exercise Vital Sign    Days of Exercise per Week: Patient declined    Minutes of Exercise per Session: Patient declined  Stress: No Stress Concern Present (02/08/2023)   Received from [facility], [facility] - Occupational Stress Questionnaire    Feeling of Stress : Not at all  Social Connections: Socially Integrated (02/08/2023)   Received from [facility], [facility]   Social Network    How would you rate your social network (family, work, friends)?: Good participation with social networks  Intimate Partner Violence: Not At Risk (11/06/2023)   Humiliation, Afraid, Rape, and Kick questionnaire    Fear of Current or Ex-Partner: No    Emotionally Abused: No    Physically Abused: No    Sexually Abused: No    FAMILY HISTORY: Family History  Problem Relation Age of Onset   Aneurysm Mother        brain   Hypertension Mother    Cancer Father    Heart disease Father    Diabetes Father    Hypertension Father    Hyperlipidemia Father    Cancer Maternal Grandmother        melanoma   Heart disease Paternal Grandfather    Colon cancer Neg Hx     ALLERGIES:  is allergic to codeine.  MEDICATIONS:  Current Outpatient Medications  Medication Sig Dispense Refill   aspirin  81 MG EC tablet Take 81 mg by mouth daily.     atorvastatin  (LIPITOR) 10 MG tablet Take 10 mg by mouth daily.      cloNIDine  (CATAPRES ) 0.2 MG tablet Take 1 tablet (0.2 mg total) by mouth 3 (three) times daily. (Patient taking differently: Take 0.2 mg by mouth 2 (two) times daily.) 90 tablet 1   empagliflozin (JARDIANCE) 10 MG TABS tablet Take by mouth daily.     furosemide (LASIX) 20 MG tablet Take 20 mg by mouth. prn     insulin  glargine (LANTUS  SOLOSTAR) 100 UNIT/ML Solostar Pen Inject 20 Units into the skin 2 (two) times daily.     lisinopril  (ZESTRIL ) 10 MG tablet Take 10 mg by mouth daily.     pantoprazole  (PROTONIX ) 40 MG tablet Take 1 tablet by mouth daily.     amitriptyline  (ELAVIL ) 25 MG tablet Take 25 mg by mouth at bedtime.     amLODipine  (NORVASC ) 5 MG tablet Take 1 tablet (5 mg total) by mouth daily. 30 tablet 1   cefdinir  (OMNICEF ) 300 MG  capsule Take 1 capsule (300 mg total) by mouth every 12 (twelve) hours. 6 capsule 0   DULoxetine  (CYMBALTA ) 20 MG capsule Take 20 mg by mouth daily.     feeding supplement (ENSURE ENLIVE / ENSURE PLUS) LIQD Take 237 mLs by mouth 3 (three) times daily between meals.     gabapentin (NEURONTIN) 300 MG capsule Take 300 mg by mouth 3 times/day as needed-between meals & bedtime.     lidocaine  (XYLOCAINE ) 2 % solution Mix equal parts diphenhydramine, lidocaine , and liquid antacid. Swish 5 to 10mLs in mouth for 60 seconds, every 4 to 6 hours as needed, then swallow or spit mixture out (as directed by prescriber). Shake well before using. Refrigerate after mixing and discard after 14 days.     linaclotide  (LINZESS ) 290 MCG CAPS capsule Take 1 capsule (290 mcg total) by mouth daily before breakfast. 30 capsule 6   metoprolol  tartrate (LOPRESSOR ) 25 MG tablet Take 0.5 tablets (12.5 mg total) by mouth 2 (two) times daily. 60 tablet 1   mometasone (ELOCON) 0.1 % cream Apply 1 Application topically 2 (two) times daily.     ondansetron  (ZOFRAN ) 8 MG tablet Take 8 mg by mouth every 8 (eight) hours as needed for nausea or vomiting.     oxyCODONE  (OXY IR/ROXICODONE ) 5 MG  immediate release tablet Take 10 mg by mouth every 4 (four) hours as needed for moderate pain (pain score 4-6).     oxyCODONE  ER 9 MG C12A Take 1 capsule by mouth every 12 (twelve) hours as needed (pain).     prochlorperazine (COMPAZINE) 10 MG tablet Take 10 mg by mouth every 8 (eight) hours as needed for vomiting or nausea.     sodium bicarbonate  650 MG tablet Take 1 tablet (650 mg total) by mouth 2 (two) times daily. 60 tablet 1   SODIUM FLUORIDE 5000 PPM 1.1 % CREA dental cream Place 1 Application onto teeth at bedtime.     tirzepatide  (MOUNJARO ) 15 MG/0.5ML Pen Inject 15 mg into the skin once a week. 2 mL 3   topiramate  (TOPAMAX ) 100 MG tablet Take 100 mg by mouth 2 (two) times daily.     No current facility-administered medications for this visit.    PHYSICAL EXAMINATION: Vitals:   11/21/23 0915  BP: 139/69  Pulse: 75  Resp: 20  Temp: 97.6 F (36.4 C)  SpO2: 100%   Filed Weights   11/21/23 0915  Weight: 165 lb 6.4 oz (75 kg)   Physical Exam Vitals reviewed.  Constitutional:      General: He is not in acute distress.    Appearance: He is ill-appearing.     Comments:    HENT:     Head: Normocephalic and atraumatic.     Mouth/Throat:     Pharynx: Oropharynx is clear.  Cardiovascular:     Rate and Rhythm: Normal rate and regular rhythm.  Pulmonary:     Comments: Decreased breath sounds bilaterally.  Abdominal:     General: There is no distension.     Palpations: Abdomen is soft.     Tenderness: There is no abdominal tenderness.  Genitourinary:    Comments: S/p Suprapubic catheter. Musculoskeletal:        General: Normal range of motion.  Lymphadenopathy:     Cervical: Cervical adenopathy present.  Skin:    General: Skin is warm.     Coloration: Skin is pale.  Neurological:     General: No focal deficit present.     Mental Status:  He is alert and oriented to person, place, and time.  Psychiatric:        Mood and Affect: Mood normal.        Behavior: Behavior  normal.    LABORATORY DATA:  I have reviewed the data as listed Lab Results  Component Value Date   WBC 4.8 11/21/2023   HGB 7.7 (L) 11/21/2023   HCT 23.8 (L) 11/21/2023   MCV 91.2 11/21/2023   PLT 197 11/21/2023   Recent Labs    11/07/23 0427 11/08/23 0354 11/08/23 0355 11/09/23 0427 11/12/23 0449 11/13/23 0430 11/14/23 0419 11/21/23 0853  NA 135  --  138   < > 149* 148* 140 136  K 4.7  --  3.7   < > 3.2* 3.4* 3.5 3.7  CL 107  --  109   < > 124* 120* 117* 103  CO2 20*  --  18*   < > 20* 20* 18* 25  GLUCOSE 51*  --  87   < > 235* 265* 242* 153*  BUN 74*  --  64*   < > 56* 44* 40* 28*  CREATININE 4.41*  --  3.36*   < > 1.98* 1.77* 1.64* 1.53*  CALCIUM  8.0*  --  8.3*   < > 8.2* 8.3* 8.2* 8.5*  GFRNONAA 14*  --  20*   < > 37* 43* 47* 51*  PROT 4.9* 5.2*  --   --  5.6*  --   --   --   ALBUMIN 1.9* 1.9* 1.9*  --  2.0*  --   --   --   AST 114* 65*  --   --  13*  --   --   --   ALT 139* 116*  --   --  40  --   --   --   ALKPHOS 63 77  --   --  82  --   --   --   BILITOT 0.5 0.3  --   --  0.7  --   --   --   BILIDIR  --  0.1  --   --   --   --   --   --   IBILI  --  0.2*  --   --   --   --   --   --    < > = values in this interval not displayed.     DG Swallowing Func-Speech Pathology Result Date: 11/11/2023 Table formatting from the original result was not included. Modified Barium Swallow Study Patient Details Name: ROBIE MCNIEL MRN: [id number] Date of Birth: 07/14/1961 Today's Date: 11/11/2023 HPI/PMH: HPI: 63 y.o. male with medical history significant for HTN, DM, CKD3, head and neck cancer, anemia, suprapubic catheter.  Patient presented to the ED with complaints of a fall and confusion.   On my evaluation patient is awake alert but fidgety, able to answer simple questions, he remembers falling but not give a lot of history.  Spouse and son who are at bedside.  Confusion started about a week ago, with patient saying things that did not make sense, they thought this was  related to patient's pain medication - they are not sure exactly how much he was taking but they believe he was taking more than prescribed.  He fell about a week ago, and then fell again yesterday.  They report yesterday he was standing and weaving about the bed and fell.  Patient denies dizziness, denies chest  pain no difficulty breathing, but he is having pain to his left side of his chest.  They report a chronic productive cough that is unchanged related to his throat cancer and treatment. Spouse reports some initial difficulty breathing today on EMS arrival, but this had resolved on arrival to the ED.   He is on ulcers in his mouth, but the report reports stable oral intake.  No vomiting.  He is constipated.  No fevers. Right tonsil HPV+ SCC. Started radiation 11/8. The total radiation dose will be 7000 cGy at 200 cGy/fraction for a total of 35 fractions, treated once a day. Chemotherapy administered concurrently. Plan for tx completion 12/20. BSE complete and MBSS recommended Clinical Impression: Clinical Impression: Pt presents with moderate sensorimotor oropharyngeal dysphagia in the setting of post radiation therapy. Note Pt's mentation is currently altered and he is unable to follow commands without max cues/support. He also requires min/mod support for feeding. Oral stage is characterized by slow disorganized oral stage followed by premature spillage of liquids a delayed swallowing trigger and occasional silent aspiration of thin liquids before the swallow. Mod amounts of aspiration observed with sequential straw sips of thin liquid which resulted in a reflexive hard cough that was somewhat effective in clearing aspirates. Cued cough for silent aspiration was effective X1 for clearing some aspirates from the airway but majority of the time Pt was unable to produce a cough or it was produced but ineffective for clearing aspirates. Note decreased pharyngeal stripping wave, decreased epiglottic deflection  and decreased laryngeal vestibule closure. Nectar thick liquids resulted in increased pharyngeal residue in diffuse locations (valleculae, posterior wall, pyriforms, aryepiglotic folds); residue was only slightly cleared with repeat swallows. Note occasional flash penetration of NTL however, no aspiraiton of NTL was visualized. HTL, puree and regular textures were consumed with prolonged oral stage, delayed swallowing trigger and pharyngeal residue however note good laryngeal vestibule closure and airway protection with these textures. Recommend continue with D2/fine chop diet and downgrade liquids to NECTAR thick liquids. Presented barium tablet and Pt masticated tablet prior to swallowing; recommend crush meds and administer with puree. Recommend free water  protocol: water  only after oral care, in between meals. Recommend strategies re: alternate bites/sips, repeat dry swallows after each bite/sip as Pt is able. ST will continue to follow acutely and will benefit from ST f/u after d/c. Factors that may increase risk of adverse event in presence of aspiration Noe & Lianne 2021): Factors that may increase risk of adverse event in presence of aspiration Noe & Lianne 2021): Frail or deconditioned; Weak cough Recommendations/Plan: Swallowing Evaluation Recommendations Swallowing Evaluation Recommendations Recommendations: PO diet PO Diet Recommendation: Dysphagia 2 (Finely chopped); Mildly thick liquids (Level 2, nectar thick) Liquid Administration via: Cup; Straw Medication Administration: Whole meds with puree Supervision: Patient able to self-feed; Staff to assist with self-feeding; Full assist for feeding; Set-up assistance for safety; Full supervision/cueing for swallowing strategies Swallowing strategies  : Slow rate; Small bites/sips; Multiple dry swallows after each bite/sip Postural changes: Position pt fully upright for meals Oral care recommendations: Oral care BID (2x/day); Oral care before ice  chips/water  Caregiver Recommendations: Avoid jello, ice cream, thin soups, popsicles; Remove water  pitcher Treatment Plan Treatment Plan Treatment recommendations: Therapy as outlined in treatment plan below Follow-up recommendations: Outpatient SLP Functional status assessment: Patient has had a recent decline in their functional status and demonstrates the ability to make significant improvements in function in a reasonable and predictable amount of time. Treatment frequency: Min 2x/week Treatment duration: 1  week Interventions: Aspiration precaution training; Oropharyngeal exercises; Compensatory techniques; Patient/family education; Trials of upgraded texture/liquids; Diet toleration management by SLP; Respiratory muscle strength training Recommendations Recommendations for follow up therapy are one component of a multi-disciplinary discharge planning process, led by the attending physician.  Recommendations may be updated based on patient status, additional functional criteria and insurance authorization. Assessment: Orofacial Exam: Orofacial Exam Oral Cavity: Oral Hygiene: Xerostomia Oral Cavity - Dentition: Adequate natural dentition; Poor condition Anatomy: Anatomy: WFL Boluses Administered: Boluses Administered Boluses Administered: Thin liquids (Level 0); Mildly thick liquids (Level 2, nectar thick); Moderately thick liquids (Level 3, honey thick); Puree; Solid  Oral Impairment Domain: Oral Impairment Domain Lip Closure: Interlabial escape, no progression to anterior lip Tongue control during bolus hold: Escape to lateral buccal cavity/floor of mouth Bolus preparation/mastication: Slow prolonged chewing/mashing with complete recollection; Disorganized chewing/mashing with solid pieces of bolus unchewed Bolus transport/lingual motion: Slow tongue motion; Repetitive/disorganized tongue motion Oral residue: Residue collection on oral structures Location of oral residue : Floor of mouth; Tongue; Palate  Initiation of pharyngeal swallow : Pyriform sinuses; Posterior laryngeal surface of the epiglottis; Valleculae; Posterior angle of the ramus  Pharyngeal Impairment Domain: Pharyngeal Impairment Domain Soft palate elevation: No bolus between soft palate (SP)/pharyngeal wall (PW) Laryngeal elevation: Partial superior movement of thyroid  cartilage/partial approximation of arytenoids to epiglottic petiole Anterior hyoid excursion: Partial anterior movement Epiglottic movement: Partial inversion Laryngeal vestibule closure: Incomplete, narrow column air/contrast in laryngeal vestibule Pharyngeal stripping wave : Present - diminished Pharyngeal contraction (A/P view only): N/A Pharyngoesophageal segment opening: Partial distention/partial duration, partial obstruction of flow Tongue base retraction: Trace column of contrast or air between tongue base and PPW Pharyngeal residue: Collection of residue within or on pharyngeal structures Location of pharyngeal residue: Valleculae; Pharyngeal wall; Aryepiglottic folds; Pyriform sinuses; Diffuse (>3 areas)  Esophageal Impairment Domain: No data recorded Pill: Pill Consistency administered: Thin liquids (Level 0) Penetration/Aspiration Scale Score: Penetration/Aspiration Scale Score 5.  Material enters airway, CONTACTS cords and not ejected out: Thin liquids (Level 0) 6.  Material enters airway, passes BELOW cords then ejected out: Thin liquids (Level 0) 7.  Material enters airway, passes BELOW cords and not ejected out despite cough attempt by patient: Thin liquids (Level 0) 8.  Material enters airway, passes BELOW cords without attempt by patient to eject out (silent aspiration) : Thin liquids (Level 0) Compensatory Strategies: Compensatory Strategies Compensatory strategies: No   General Information: Caregiver present: No  Diet Prior to this Study: Dysphagia 2 (finely chopped); Thin liquids (Level 0)   Temperature : Normal   Respiratory Status: WFL   No data recorded  No data  recorded Behavior/Cognition: Alert; Cooperative; Pleasant mood; Confused; Requires cueing; Doesn't follow directions Self-Feeding Abilities: Able to self-feed; Needs set-up for self-feeding; Needs assist with self-feeding Baseline vocal quality/speech: Hypophonia/low volume No data recorded Volitional Swallow: Able to elicit No data recorded Goal Planning: Prognosis for improved oropharyngeal function: Fair Barriers to Reach Goals: Severity of deficits No data recorded Patient/Family Stated Goal: N/A No data recorded Pain: Pain Assessment Pain Assessment: Faces Faces Pain Scale: 4 Breathing: 0 Negative Vocalization: 1 Facial Expression: 2 Body Language: 1 Consolability: 1 PAINAD Score: 5 Pain Location: throat Pain Descriptors / Indicators: Grimacing; Guarding Pain Intervention(s): Limited activity within patient's tolerance; Monitored during session End of Session: Start Time:SLP Start Time (ACUTE ONLY): 1156 Stop Time: SLP Stop Time (ACUTE ONLY): 1223 Time Calculation:SLP Time Calculation (min) (ACUTE ONLY): 27 min Charges: SLP Evaluations $ SLP Speech Visit: 1 Visit SLP Evaluations $  MBS Swallow: 1 Procedure $Swallowing Treatment: 1 Procedure SLP visit diagnosis: SLP Visit Diagnosis: Dysphagia, unspecified (R13.10) Past Medical History: Past Medical History: Diagnosis Date  Adult celiac disease 2015  followed by dr jinny (GI)  Anemia associated with chronic renal failure   hematologist--- dr bea;  treated with iron  infusions  Benign localized prostatic hyperplasia with lower urinary tract symptoms (LUTS)   urologist--- dr matilda  Cancer [facility]) 08/18/2023  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  Charcot foot due to diabetes mellitus (HCC)   Chronic constipation   CKD (chronic kidney disease), stage III [facility])   nephrologist--- dr rachele;  renal bx 11-20-2021  Diverticulosis of colon   Edema of both lower extremities   GERD (gastroesophageal reflux disease)   History of adenomatous polyp of  colon   HTN (hypertension)   Hyperlipidemia, mixed   Peripheral neuropathy   Peyronie's disease   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 meatal stricture   Retinopathy due to secondary diabetes mellitus (HCC)   both eyes  treated with injecitons  Thoracic spondylosis without myelopathy 11/22/2016  Type 2 diabetes mellitus [facility])   endocrinologist--- dr nida  Vitamin D  deficiency   Wears hearing aid in both ears  Past Surgical History: Past Surgical History: Procedure Laterality Date  BIOPSY N/A 09/07/2021  Procedure: BIOPSY;  Surgeon: Jinny Carmine, MD;  Location: [facility];  Service: Endoscopy;  Laterality: N/A;  CATARACT EXTRACTION W/ INTRAOCULAR LENS IMPLANT Bilateral 2023  COLONOSCOPY N/A 05/03/2014  Procedure: COLONOSCOPY;  Surgeon: Margo LITTIE Haddock, MD;  Location: [facility];  Service: Endoscopy;  Laterality: N/A;  12:00  COLONOSCOPY WITH PROPOFOL  N/A 09/07/2021  Procedure: COLONOSCOPY WITH PROPOFOL ;  Surgeon: Jinny Carmine, MD;  Location: [facility];  Service: Endoscopy;  Laterality: N/A;  CYSTOSCOPY WITH INJECTION N/A 07/26/2023  Procedure: CYSTOSCOPY WITH BOTOX  INJECTION 100 UNITS;  Surgeon: Elisabeth Valli BIRCH, MD;  Location: [facility] ORS;  Service: Urology;  Laterality: N/A;  30 minutes  CYSTOSCOPY WITH URETHRAL DILATATION N/A 01/10/2023  Procedure: CYSTOSCOPY;  Surgeon: Matilda Senior, MD;  Location: [facility];  Service: Urology;  Laterality: N/A;  ESOPHAGOGASTRODUODENOSCOPY N/A 05/03/2014  Procedure: ESOPHAGOGASTRODUODENOSCOPY (EGD);  Surgeon: Margo LITTIE Haddock, MD;  [facility];  Service: Endoscopy;  Laterality: N/A;  ESOPHAGOGASTRODUODENOSCOPY (EGD) WITH PROPOFOL  N/A 06/03/2020  Procedure: ESOPHAGOGASTRODUODENOSCOPY (EGD) WITH PROPOFOL ;  Surgeon: Maryruth Ole DASEN, MD;  Location: [facility] ENDOSCOPY;  Service: Endoscopy;  Laterality: N/A;  ESOPHAGOGASTRODUODENOSCOPY (EGD) WITH PROPOFOL  N/A 09/07/2021  Procedure: ESOPHAGOGASTRODUODENOSCOPY (EGD) WITH PROPOFOL ;  Surgeon: Jinny Carmine, MD;  Location: [facility];  Service: Endoscopy;  Laterality: N/A;  Diabetic  IR BONE MARROW BIOPSY & ASPIRATION  04/27/2023  MEATOTOMY N/A 01/10/2023  Procedure: MEATOTOMY ADULT;  Surgeon: Matilda Senior, MD;  Location: [facility];  Service: Urology;  Laterality: N/A;  PARS PLANA VITRECTOMY Right 03/21/2021  Procedure: PARS PLANA VITRECTOMY 25 GAUGE WITH INJECTION OF ANTIBIOTICS FOR ENDOPHTHALMITIS;  Surgeon: Tobie Baptist, MD;  Location: [facility] OR;  Service: Ophthalmology;  Laterality: Right;  POLYPECTOMY N/A 09/07/2021  Procedure: POLYPECTOMY;  Surgeon: Jinny Carmine, MD;  Location: [facility];  Service: Endoscopy;  Laterality: N/A;  REPAIR EXTENSOR TENDON Right 08/11/2022  Procedure: EXTENSOR CARPI ULNARIS TENDON DEBRIDEMENT, POSSIBLE SUBSHEATH RECONSTRUCTION;  Surgeon: Romona Harari, MD;  Location: [facility];  Service: Orthopedics;  Laterality: Right;  or regional plus MAC Amelia H. Clois KILLIAN, CCC-SLP Speech Language Pathologist Raguel VEAR Clois 11/11/2023, 2:52 PM  CT HEAD WO CONTRAST ( ) Result Date: 11/06/2023 CLINICAL  DATA:  Patient fell 2 days ago. History of head neck cancer. Encephalopathy. EXAM: CT HEAD WITHOUT CONTRAST TECHNIQUE: Contiguous axial images were obtained from the base of the skull through the vertex without intravenous contrast. RADIATION DOSE REDUCTION: This exam was performed according to the departmental dose-optimization program which includes automated exposure control, adjustment of the mA and/or kV according to patient size and/or use of iterative reconstruction technique. COMPARISON:  05/12/2021 FINDINGS: Brain: There is no evidence for acute hemorrhage, hydrocephalus, mass lesion, or abnormal extra-axial fluid collection. No definite CT evidence for acute infarction. Vascular: No hyperdense vessel or unexpected calcification. Skull: No evidence for fracture. No worrisome lytic or sclerotic lesion.  Sinuses/Orbits: The visualized paranasal sinuses and mastoid air cells are clear. Visualized portions of the globes and intraorbital fat are unremarkable. Other: None. IMPRESSION: No acute intracranial abnormality. Electronically Signed   By: Camellia Candle M.D.   On: 11/06/2023 16:29   DG Pelvis Portable Result Date: 11/06/2023 CLINICAL DATA:  Fall. EXAM: PORTABLE PELVIS 1-2 VIEWS COMPARISON:  Same day CT chest, abdomen, and pelvis. FINDINGS: There is no evidence of pelvic fracture or diastasis. The sacroiliac joints and pubic symphysis appear anatomically aligned. No pelvic bone lesions are seen. IMPRESSION: No acute osseous abnormality identified on [facility] pelvic radiograph. Electronically Signed   By: Harrietta Sherry M.D.   On: 11/06/2023 14:09   DG Chest Port 1 View Result Date: 11/06/2023 CLINICAL DATA:  Fall. EXAM: PORTABLE CHEST 1 VIEW COMPARISON:  Same day CT chest, abdomen, and pelvis. Chest radiograph dated February 03, 2023. FINDINGS: Low lung volumes with accentuation of the cardiomediastinal silhouette. Overlapping telemetry wires. Patchy opacities at the left lung base. Blunting of the left costophrenic angle. The right lung appears clear. No pneumothorax. Posterior left tenth and eleventh rib fractures are not well visualized on this exam and better evaluated on the same-day CT chest, abdomen, and pelvis. IMPRESSION: 1. Posterior left tenth and eleventh rib fractures are not well visualized on this exam and better evaluated on the same-day CT chest, abdomen, and pelvis. No pneumothorax. 2. Patchy opacities at the left lung base could reflect contusion, infiltrate, or atelectasis. 3. Probable trace left effusion. Electronically Signed   By: Harrietta Sherry M.D.   On: 11/06/2023 14:06   CT CHEST ABDOMEN PELVIS WO CONTRAST Result Date: 11/06/2023 CLINICAL DATA:  Fall 2 days ago. Blunt trauma. New onset hypoxia. Rib pain. EXAM: CT CHEST, ABDOMEN AND PELVIS WITHOUT CONTRAST TECHNIQUE: Multidetector  CT imaging of the chest, abdomen and pelvis was performed following the standard protocol without IV contrast. RADIATION DOSE REDUCTION: This exam was performed according to the departmental dose-optimization program which includes automated exposure control, adjustment of the mA and/or kV according to patient size and/or use of iterative reconstruction technique. COMPARISON:  CT [facility] from 10/15/2022 FINDINGS: CT CHEST FINDINGS Cardiovascular: Heart size appears mildly enlarged. There is no pericardial effusion. Aortic atherosclerotic calcifications and coronary artery calcifications. Mediastinum/Nodes: No enlarged mediastinal, hilar, or axillary lymph nodes. Thyroid  gland, trachea, and esophagus demonstrate no significant findings. Lungs/Pleura: Small volume of pleural fluid is identified within the left base the fluid measures water  density. Lingular atelectasis. In the left lower lobe there are multifocal areas of subsegmental atelectasis, ground-glass attenuation, and posterior consolidative change. No pneumothorax identified. Musculoskeletal: There are acute fractures involving the posterior aspect of the left tenth and eleventh ribs posteriorly, image 53/2 and image 58/2. The thoracic vertebral body heights are well preserved without signs of acute fracture. Intact sternum. CT ABDOMEN PELVIS  FINDINGS Hepatobiliary: No focal liver abnormality is seen. Status post cholecystectomy. No biliary dilatation. Pancreas: Unremarkable. No pancreatic ductal dilatation or surrounding inflammatory changes. Spleen: No splenic injury or perisplenic hematoma. Adrenals/Urinary Tract: No adrenal hemorrhage or renal injury identified. The bladder is partially decompressed around a suprapubic catheter. Stomach/Bowel: Stomach is unremarkable. No pathologic dilatation of the large or small bowel loops. The appendix is visualized and is normal. There is a moderate to large stool burden identified within the ascending colon. No bowel  wall thickening or inflammatory changes identified Vascular/Lymphatic: Aortic atherosclerosis. No enlarged abdominal or pelvic lymph nodes. Reproductive: Prostate is unremarkable. Other: No free fluid or fluid collections. Small fat containing umbilical hernia. Musculoskeletal: No fracture is seen. IMPRESSION: 1. Acute fractures involving the posterior aspect of the left tenth and eleventh ribs posteriorly. 2. Small volume of pleural fluid is identified within the left base. The fluid measures water  density. No pneumothorax identified. 3. Multifocal areas of subsegmental atelectasis, ground-glass attenuation, and posterior consolidative change within the left lower lobe. Findings may reflect pulmonary contusion and/or atelectasis. 4. No acute findings within the abdomen or pelvis. 5. Moderate to large stool burden identified within the ascending colon. Correlate for any clinical signs or symptoms of constipation. 6. Coronary artery calcifications. 7.  Aortic Atherosclerosis (ICD10-I70.0). Electronically Signed   By: Waddell Calk M.D.   On: 11/06/2023 13:56    Disposition:   # Anemia-symptomatic fatigue hemoglobin 9-10--likely multifactorial-iron  malabsorption/celiac disease/CKD-stage MAY 2024- Bone marrow biopsy/aspiration- The bone marrow is generally normocellular for age with trilineage  hematopoiesis and nonspecific changes, likely secondary in nature in  this setting.  There is no morphologic evidence of a lymphoproliferative process or plasma cell neoplasm.   # Hemoglobin today is 7.7. On venofer  & retacrit . Sep iron  sat 24, ferritin 200. Retacrit  has been held d/t active chemotherapy for curable cancer. Last received venofer  10/10/23. Today's ferritin 1125, iron  sat 19%. Hold venofer  today. May consider blood transfusion however if hemoglobin < 7. If symptomatic, consider < 8. For now, he wishes to hold off    # 09/07/2023: Stage II (cT3, cN1, cM0, p16+)  -currently undergoing definitive  chemoradiation with CarboTaxol weekly [[facility]]- defer to [facility] for further management.   # CKD stage III [nephro, [place]]-diabetes- GFR- 40s. Today GFR 51. Stable.    # Diabetes [[facility] Hb A1c- 7.4]-PBG 289  [steroid injection]-continue follow-up with endocrinology/monitoring of blood sugars. stable   # DISPOSITION: Hold venofer  Follow up as planned- la  No problem-specific Assessment & Plan notes found for this encounter.  All questions were answered. The patient knows to call the clinic with any problems, questions or concerns.    Tinnie KANDICE Dawn, NP 11/21/2023

## 2023-11-25 ENCOUNTER — Other Ambulatory Visit: Payer: Self-pay | Admitting: Urology

## 2023-12-07 NOTE — Patient Instructions (Signed)
DUE TO COVID-19 ONLY TWO VISITORS  (aged 63 and older)  ARE ALLOWED TO COME WITH YOU AND STAY IN THE WAITING ROOM ONLY DURING PRE OP AND PROCEDURE.   **NO VISITORS ARE ALLOWED IN THE SHORT STAY AREA OR RECOVERY ROOM!!**  IF YOU WILL BE ADMITTED INTO THE HOSPITAL YOU ARE ALLOWED ONLY FOUR SUPPORT PEOPLE DURING VISITATION HOURS ONLY (7 AM -8PM)   The support person(s) must pass our screening, gel in and out, and wear a mask at all times, including in the patient's room. Patients must also wear a mask when staff or their support person are in the room. Visitors GUEST BADGE MUST BE WORN VISIBLY  One adult visitor may remain with you overnight and MUST be in the room by 8 P.M.     Your procedure is scheduled on: 12/20/23   Report to [facility] Main Entrance    Report to admitting at : 11:15 AM   Call this number if you have problems the morning of surgery [phone]   Do not eat food :After Midnight.   After Midnight you may have the following liquids until : 10:30 AM DAY OF SURGERY  Water Black Coffee (sugar ok, NO MILK/CREAM OR CREAMERS)  Tea (sugar ok, NO MILK/CREAM OR CREAMERS) regular and decaf                             Plain Jell-O (NO RED)                                           Fruit ices (not with fruit pulp, NO RED)                                     Popsicles (NO RED)                                                                  Juice: apple, WHITE grape, WHITE cranberry Sports drinks like Gatorade (NO RED)              FOLLOW ANY ADDITIONAL PRE OP INSTRUCTIONS YOU RECEIVED FROM YOUR SURGEON'S OFFICE!!!   Oral Hygiene is also important to reduce your risk of infection.                                    Remember - BRUSH YOUR TEETH THE MORNING OF SURGERY WITH YOUR REGULAR TOOTHPASTE  DENTURES WILL BE REMOVED PRIOR TO SURGERY PLEASE DO NOT APPLY "Poly grip" OR ADHESIVES!!!   Do NOT smoke after Midnight   Take these medicines the morning of surgery with A  SIP OF WATER: clonidine,bactrim,pantoprazole.  How to Manage Your Diabetes Before and After Surgery  Why is it important to control my blood sugar before and after surgery? Improving blood sugar levels before and after surgery helps healing and can limit problems. A way of improving blood sugar control is eating a healthy diet by:  Eating less sugar  and carbohydrates  Increasing activity/exercise  Talking with your doctor about reaching your blood sugar goals High blood sugars (greater than 180 mg/dL) can raise your risk of infections and slow your recovery, so you will need to focus on controlling your diabetes during the weeks before surgery. Make sure that the doctor who takes care of your diabetes knows about your planned surgery including the date and location.  How do I manage my blood sugar before surgery? Check your blood sugar at least 4 times a day, starting 2 days before surgery, to make sure that the level is not too high or low. Check your blood sugar the morning of your surgery when you wake up and every 2 hours until you get to the Short Stay unit. If your blood sugar is less than 70 mg/dL, you will need to treat for low blood sugar: Do not take insulin. Treat a low blood sugar (less than 70 mg/dL) with  cup of clear juice (cranberry or apple), 4 glucose tablets, OR glucose gel. Recheck blood sugar in 15 minutes after treatment (to make sure it is greater than 70 mg/dL). If your blood sugar is not greater than 70 mg/dL on recheck, call [phone] for further instructions. Report your blood sugar to the short stay nurse when you get to Short Stay.  If you are admitted to the hospital after surgery: Your blood sugar will be checked by the staff and you will probably be given insulin after surgery (instead of oral diabetes medicines) to make sure you have good blood sugar levels. The goal for blood sugar control after surgery is 80-180 mg/dL.   WHAT DO I DO ABOUT MY  DIABETES MEDICATION?  HOLD jardiance after: 12/16/23  THE NIGHT BEFORE SURGERY, take ONLY half of the glargine insulin dose.     THE MORNING OF SURGERY, take ONLY half of the glargine insulin dose.  DO NOT TAKE THE FOLLOWING 7 DAYS PRIOR TO SURGERY: Ozempic, Wegovy, Rybelsus (Semaglutide), Byetta (exenatide), Bydureon (exenatide ER), Victoza, Saxenda (liraglutide), or Trulicity (dulaglutide) Mounjaro (Tirzepatide) Adlyxin (Lixisenatide), Polyethylene Glycol Loxenatide.  DO NOT TAKE ANY ORAL DIABETIC MEDICATIONS DAY OF YOUR SURGERY                  You may not have any metal on your body including hair pins, jewelry, and body piercing             Do not wear lotions, powders, perfumes/cologne, or deodorant              Men may shave face and neck.   Do not bring valuables to the hospital. [place] IS NOT             RESPONSIBLE   FOR VALUABLES.   Contacts, glasses, or bridgework may not be worn into surgery.   Bring small overnight bag day of surgery.   DO NOT BRING YOUR HOME MEDICATIONS TO THE HOSPITAL. PHARMACY WILL DISPENSE MEDICATIONS LISTED ON YOUR MEDICATION LIST TO YOU DURING YOUR ADMISSION IN THE HOSPITAL!    Patients discharged on the day of surgery will not be allowed to drive home.  Someone NEEDS to stay with you for the first 24 hours after anesthesia.   Special Instructions: Bring a copy of your healthcare power of attorney and living will documents         the day of surgery if you haven't scanned them before.              Please  read over the following fact sheets you were given: IF YOU HAVE QUESTIONS ABOUT YOUR PRE-OP INSTRUCTIONS PLEASE CALL [phone]    [facility] - Preparing for Surgery Before surgery, you can play an important role.  Because skin is not sterile, your skin needs to be as free of germs as possible.  You can reduce the number of germs on your skin by washing with CHG (chlorahexidine gluconate) soap before surgery.  CHG is an antiseptic cleaner  which kills germs and bonds with the skin to continue killing germs even after washing. Please DO NOT use if you have an allergy to CHG or antibacterial soaps.  If your skin becomes reddened/irritated stop using the CHG and inform your nurse when you arrive at Short Stay. Do not shave (including legs and underarms) for at least 48 hours prior to the first CHG shower.  You may shave your face/neck. Please follow these instructions carefully:  1.  Shower with CHG Soap the night before surgery and the  morning of Surgery.  2.  If you choose to wash your hair, wash your hair first as usual with your  normal  shampoo.  3.  After you shampoo, rinse your hair and body thoroughly to remove the  shampoo.                           4.  Use CHG as you would any other liquid soap.  You can apply chg directly  to the skin and wash                       Gently with a scrungie or clean washcloth.  5.  Apply the CHG Soap to your body ONLY FROM THE NECK DOWN.   Do not use on face/ open                           Wound or open sores. Avoid contact with eyes, ears mouth and genitals (private parts).                       Wash face,  Genitals (private parts) with your normal soap.             6.  Wash thoroughly, paying special attention to the area where your surgery  will be performed.  7.  Thoroughly rinse your body with warm water from the neck down.  8.  DO NOT shower/wash with your normal soap after using and rinsing off  the CHG Soap.                9.  Pat yourself dry with a clean towel.            10.  Wear clean pajamas.            11.  Place clean sheets on your bed the night of your first shower and do not  sleep with pets. Day of Surgery : Do not apply any lotions/deodorants the morning of surgery.  Please wear clean clothes to the hospital/surgery center.  FAILURE TO FOLLOW THESE INSTRUCTIONS MAY RESULT IN THE CANCELLATION OF YOUR SURGERY PATIENT SIGNATURE_________________________________  NURSE  SIGNATURE__________________________________  ________________________________________________________________________

## 2023-12-09 ENCOUNTER — Encounter (HOSPITAL_COMMUNITY): Payer: Self-pay

## 2023-12-09 ENCOUNTER — Encounter (HOSPITAL_COMMUNITY)
Admission: RE | Admit: 2023-12-09 | Discharge: 2023-12-09 | Disposition: A | Discharge status: Home / Self Care | Payer: BC Managed Care – PPO | Source: Ambulatory Visit | Attending: Urology | Admitting: Urology

## 2023-12-09 ENCOUNTER — Other Ambulatory Visit: Payer: Self-pay

## 2023-12-09 VITALS — BP 146/69 | HR 63 | Temp 98.3°F | Ht 68.0 in | Wt 164.0 lb

## 2023-12-09 DIAGNOSIS — I10 Essential (primary) hypertension: Secondary | ICD-10-CM

## 2023-12-09 DIAGNOSIS — I129 Hypertensive chronic kidney disease with stage 1 through stage 4 chronic kidney disease, or unspecified chronic kidney disease: Secondary | ICD-10-CM | POA: Insufficient documentation

## 2023-12-09 DIAGNOSIS — Z01812 Encounter for preprocedural laboratory examination: Secondary | ICD-10-CM | POA: Insufficient documentation

## 2023-12-09 DIAGNOSIS — N183 Chronic kidney disease, stage 3 unspecified: Secondary | ICD-10-CM | POA: Diagnosis not present

## 2023-12-09 DIAGNOSIS — Z01818 Encounter for other preprocedural examination: Secondary | ICD-10-CM | POA: Diagnosis present

## 2023-12-09 DIAGNOSIS — E1122 Type 2 diabetes mellitus with diabetic chronic kidney disease: Secondary | ICD-10-CM | POA: Insufficient documentation

## 2023-12-09 HISTORY — DX: Pneumonia, unspecified organism: J18.9

## 2023-12-09 LAB — CBC
HCT: 27.3 % — ABNORMAL LOW (ref 39.0–52.0)
Hemoglobin: 8.3 g/dL — ABNORMAL LOW (ref 13.0–17.0)
MCH: 30.4 pg (ref 26.0–34.0)
MCHC: 30.4 g/dL (ref 30.0–36.0)
MCV: 100 fL (ref 80.0–100.0)
Platelets: 216 10*3/uL (ref 150–400)
RBC: 2.73 MIL/uL — ABNORMAL LOW (ref 4.22–5.81)
RDW: 19 % — ABNORMAL HIGH (ref 11.5–15.5)
WBC: 4.6 10*3/uL (ref 4.0–10.5)
nRBC: 0 % (ref 0.0–0.2)

## 2023-12-09 LAB — BASIC METABOLIC PANEL
Anion gap: 9 (ref 5–15)
BUN: 35 mg/dL — ABNORMAL HIGH (ref 8–23)
CO2: 20 mmol/L — ABNORMAL LOW (ref 22–32)
Calcium: 8.9 mg/dL (ref 8.9–10.3)
Chloride: 108 mmol/L (ref 98–111)
Creatinine, Ser: 2.53 mg/dL — ABNORMAL HIGH (ref 0.61–1.24)
GFR, Estimated: 28 mL/min — ABNORMAL LOW (ref >60)
Glucose, Bld: 279 mg/dL — ABNORMAL HIGH (ref 70–99)
Potassium: 6 mmol/L — ABNORMAL HIGH (ref 3.5–5.1)
Sodium: 137 mmol/L (ref 135–145)

## 2023-12-09 LAB — GLUCOSE, CAPILLARY: Glucose-Capillary: 263 mg/dL — ABNORMAL HIGH (ref 70–99)

## 2023-12-09 NOTE — Progress Notes (Signed)
Lab. Results: Hemoglobin: 8.3; HCT: 27.3; Potassium: 6.0; Glucose blood: 279;Creatinine: 2.53

## 2023-12-09 NOTE — Progress Notes (Addendum)
For Anesthesia: PCP - Chyrl Civatte, FNP  Cardiologist - N/A  Bowel Prep reminder:  Chest x-ray -  EKG - 11/06/23 Stress Test -  ECHO - 03/10/23 Cardiac Cath -  Pacemaker/ICD device last checked: Pacemaker orders received: Device Rep notified:  Spinal Cord Stimulator:  Sleep Study - Yes CPAP - Yes  Fasting Blood Sugar -  Checks Blood Sugar ___1__ times a day Date and result of last Hgb A1c-120's  Last dose of GLP1 agonist- Mounjaro GLP1 instructions: Last dose more than two weeks ago. Keep on hold.  Last dose of SGLT-2 inhibitors- Jardiance SGLT-2 instructions: To hold after:12/16/23  Blood Thinner Instructions: Aspirin Instructions: Will be on hold a week before. Last Dose:  Activity level: Can go up a flight of stairs and activities of daily living without stopping and without chest pain and/or shortness of breath   Able to exercise without chest pain and/or shortness of breath  Anesthesia review: OSA(CPAP),CKD III,HTN,DIA,Recent PNA(11/23/23),Last hemoglobin: 7.7: 11/21/23.  Patient denies shortness of breath, fever, cough and chest pain at PAT appointment   Patient verbalized understanding of instructions that were given to them at the PAT appointment. Patient was also instructed that they will need to review over the PAT instructions again at home before surgery.

## 2023-12-12 ENCOUNTER — Encounter: Payer: Self-pay | Admitting: Internal Medicine

## 2023-12-12 ENCOUNTER — Inpatient Hospital Stay: Payer: BC Managed Care – PPO

## 2023-12-12 ENCOUNTER — Other Ambulatory Visit: Payer: Self-pay

## 2023-12-12 ENCOUNTER — Encounter (HOSPITAL_COMMUNITY): Payer: Self-pay

## 2023-12-12 ENCOUNTER — Inpatient Hospital Stay (HOSPITAL_BASED_OUTPATIENT_CLINIC_OR_DEPARTMENT_OTHER): Payer: BC Managed Care – PPO | Admitting: Internal Medicine

## 2023-12-12 VITALS — BP 130/67 | HR 76 | Temp 97.8°F | Resp 20 | Wt 177.0 lb

## 2023-12-12 DIAGNOSIS — I129 Hypertensive chronic kidney disease with stage 1 through stage 4 chronic kidney disease, or unspecified chronic kidney disease: Secondary | ICD-10-CM | POA: Diagnosis not present

## 2023-12-12 DIAGNOSIS — D649 Anemia, unspecified: Secondary | ICD-10-CM | POA: Diagnosis not present

## 2023-12-12 LAB — CBC WITH DIFFERENTIAL (CANCER CENTER ONLY)
Abs Immature Granulocytes: 0.04 10*3/uL (ref 0.00–0.07)
Basophils Absolute: 0 10*3/uL (ref 0.0–0.1)
Basophils Relative: 0 %
Eosinophils Absolute: 0.2 10*3/uL (ref 0.0–0.5)
Eosinophils Relative: 5 %
HCT: 24.5 % — ABNORMAL LOW (ref 39.0–52.0)
Hemoglobin: 7.4 g/dL — ABNORMAL LOW (ref 13.0–17.0)
Immature Granulocytes: 1 %
Lymphocytes Relative: 8 %
Lymphs Abs: 0.3 10*3/uL — ABNORMAL LOW (ref 0.7–4.0)
MCH: 30.7 pg (ref 26.0–34.0)
MCHC: 30.2 g/dL (ref 30.0–36.0)
MCV: 101.7 fL — ABNORMAL HIGH (ref 80.0–100.0)
Monocytes Absolute: 0.4 10*3/uL (ref 0.1–1.0)
Monocytes Relative: 9 %
Neutro Abs: 3.1 10*3/uL (ref 1.7–7.7)
Neutrophils Relative %: 77 %
Platelet Count: 175 10*3/uL (ref 150–400)
RBC: 2.41 MIL/uL — ABNORMAL LOW (ref 4.22–5.81)
RDW: 19.6 % — ABNORMAL HIGH (ref 11.5–15.5)
WBC Count: 3.9 10*3/uL — ABNORMAL LOW (ref 4.0–10.5)
nRBC: 0.5 % — ABNORMAL HIGH (ref 0.0–0.2)

## 2023-12-12 LAB — BASIC METABOLIC PANEL - CANCER CENTER ONLY
Anion gap: 7 (ref 5–15)
BUN: 37 mg/dL — ABNORMAL HIGH (ref 8–23)
CO2: 21 mmol/L — ABNORMAL LOW (ref 22–32)
Calcium: 8.5 mg/dL — ABNORMAL LOW (ref 8.9–10.3)
Chloride: 111 mmol/L (ref 98–111)
Creatinine: 1.86 mg/dL — ABNORMAL HIGH (ref 0.61–1.24)
GFR, Estimated: 40 mL/min — ABNORMAL LOW (ref >60)
Glucose, Bld: 301 mg/dL — ABNORMAL HIGH (ref 70–99)
Potassium: 5 mmol/L (ref 3.5–5.1)
Sodium: 139 mmol/L (ref 135–145)

## 2023-12-12 NOTE — Assessment & Plan Note (Signed)
#  Anemia-symptomatic fatigue hemoglobin 9-10--likely multifactorial-iron malabsorption/celiac disease/CKD-stage MAY 2024- Bone marrow biopsy/aspiration- The bone marrow is generally normocellular for age with trilineage  hematopoiesis and nonspecific changes, likely secondary in nature in  this setting.  There is no morphologic evidence of a lymphoproliferative  process or plasma cell neoplasm.   # Anemia: SEP 2024-iron saturation- 19  ferritin700;  HOLD further retacrit  as patient is undergoing active chemotherapy for curable cancer.  HOLD venofer today  # 09/07/2023: Stage II (cT3, cN1, cM0, p16+)  -currently s/p definitive chemoradiation with CarboTaxol weekly [[facility]] on DEC 20th- awaiting follow up PET scan in March 2025.Defer to [facility] for further management.  # Ckd stage III [nephro, [place]]-diabetes- GFR- 40s-  stable  # Diabetes [[facility] Hb A1c- 7.4]-PBG 301 continue follow-up with endocrinology/monitoring of blood sugars. stable  # DISPOSITION: # NO Venofer today; NO retacrit # follow up in 2 months-; MD;  labs- cbc/bmp; ;possible venofer or retacrit-  -Dr.B

## 2023-12-12 NOTE — Progress Notes (Signed)
[facility] CONSULT NOTE  Patient Care Team: Chyrl Civatte, FNP as PCP - General (Family Medicine) Otelia Sergeant, NP (Inactive) as Nurse Practitioner (Endocrinology) Earna Coder, MD as Consulting Physician (Oncology)  CHIEF COMPLAINTS/PURPOSE OF CONSULTATION: ANEMIA   HEMATOLOGY HISTORY:  # ANEMIA PROGRESSIVE since 2020 12; 2022- 10 MCV- 90s; colonoscopy 2015; multiple endoscopies-last September 2021-biopsy active celiac disease; MAY 2024- Bone marrow biopsy/aspiration- The bone marrow is generally normocellular for age with trilineage  hematopoiesis and nonspecific changes, likely secondary in nature in  this setting.  There is no morphologic evidence of a lymphoproliferative  process or plasma cell neoplasm.   #CKD stage III [[facility]]/diabetes [[facility]]; ? Celaic disease [[facility]];    - Labs: 07/2014 - tTG IgA 101 03/2020 - positive endomysial Ab IgA, tTG IgA 78 - CSY: 07/2014 - normal appearing terminal ileum, sigmoid diverticulosis, and non-bleeding internal hemorrhoids - EGD: 07/2014 - mild non-erosive gastritis, normal appearing duodenal mucosa with duodenal biopsies showing celiac sprue - EGD: 06/03/2020 - procedure aborted due to presence of food - EGD: 08/12/2020 - irregular Z-line found at GEJ with bx showing mild reflux esophagitis, 1 cm hiatal hernia, mild chronic gastritis, decreased folds found in entire duodenum, flattening found in entire duodenum and scalloped mucosa with bx showing active Celiac disease with villous atrophy, increased intraepithelial lymphocytes, etc. Repeat in 1-year.  HISTORY OF PRESENTING ILLNESS:  with wife.  Ambulating independently.  Brian Nielsen 63 y.o.  male symptomatic anemia-likely secondary to chronic renal disease-IDA / poorly controlled diabetes-complications of CKD; peripheral neuropathy; and also newly diagnosed p16 positive tonsil cancer is here for follow-up.  Patient is currently s/p  definitive  chemoradiation at [facility] for his tonsil cancer.  Does have some trouble sleeping, now has a cpap.   Patient continues to complain of pain in right foot.   Otherwise no nausea no vomiting.   Review of Systems  Constitutional:  Positive for malaise/fatigue and weight loss. Negative for chills, diaphoresis and fever.  HENT:  Negative for nosebleeds and sore throat.   Eyes:  Negative for double vision.  Respiratory:  Positive for shortness of breath. Negative for cough, hemoptysis, sputum production and wheezing.   Cardiovascular:  Negative for chest pain, palpitations, orthopnea and leg swelling.  Gastrointestinal:  Negative for blood in stool, heartburn, melena, nausea and vomiting.  Genitourinary:  Negative for dysuria, frequency and urgency.  Musculoskeletal:  Positive for back pain and joint pain.  Skin: Negative.  Negative for itching and rash.  Neurological:  Positive for weakness. Negative for dizziness, tingling, focal weakness and headaches.  Endo/Heme/Allergies:  Does not bruise/bleed easily.  Psychiatric/Behavioral:  Negative for depression. The patient is not nervous/anxious and does not have insomnia.     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   Charcot foot due to diabetes mellitus (HCC)    Chronic constipation    CKD (chronic kidney disease), stage III [facility])    nephrologist--- dr Wolfgang Phoenix;  renal bx 11-20-2021   Diverticulosis of colon    Edema of both lower extremities    GERD (gastroesophageal reflux disease)  History of adenomatous polyp of colon    HTN (hypertension)    Hyperlipidemia, mixed     Peripheral neuropathy    Peyronie's disease    Pneumonia    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 meatal stricture    Retinopathy due to secondary diabetes mellitus (HCC)    both eyes  treated with injecitons   Thoracic spondylosis without myelopathy 11/22/2016   Type 2 diabetes mellitus [facility])    endocrinologist--- dr nida   Vitamin D deficiency    Wears hearing aid in both ears     SURGICAL HISTORY: Past Surgical History:  Procedure Laterality Date   BIOPSY N/A 09/07/2021   Procedure: BIOPSY;  Surgeon: Midge Minium, MD;  Location: [facility];  Service: Endoscopy;  Laterality: N/A;   CATARACT EXTRACTION W/ INTRAOCULAR LENS IMPLANT Bilateral 2023   COLONOSCOPY N/A 05/03/2014   Procedure: COLONOSCOPY;  Surgeon: West Bali, MD;  [facility];  Service: Endoscopy;  Laterality: N/A;  12:00   COLONOSCOPY WITH PROPOFOL N/A 09/07/2021   Procedure: COLONOSCOPY WITH PROPOFOL;  Surgeon: Midge Minium, MD;  Location: [facility];  Service: Endoscopy;  Laterality: N/A;   CYSTOSCOPY WITH INJECTION N/A 07/26/2023   Procedure: CYSTOSCOPY WITH BOTOX INJECTION 100 UNITS;  Surgeon: Noel Christmas, MD;  Location: [facility];  Service: Urology;  Laterality: N/A;  30 minutes   CYSTOSCOPY WITH URETHRAL DILATATION N/A 01/10/2023   Procedure: CYSTOSCOPY;  Surgeon: Marcine Matar, MD;  Location: [facility];  Service: Urology;  Laterality: N/A;   ESOPHAGOGASTRODUODENOSCOPY N/A 05/03/2014   Procedure: ESOPHAGOGASTRODUODENOSCOPY (EGD);  Surgeon: West Bali, MD;  [facility];  Service: Endoscopy;  Laterality: N/A;   ESOPHAGOGASTRODUODENOSCOPY (EGD) WITH PROPOFOL N/A 06/03/2020   Procedure: ESOPHAGOGASTRODUODENOSCOPY (EGD) WITH PROPOFOL;  Surgeon: Regis Bill, MD;  Location: [facility];  Service: Endoscopy;  Laterality: N/A;   ESOPHAGOGASTRODUODENOSCOPY (EGD) WITH PROPOFOL N/A 09/07/2021   Procedure: ESOPHAGOGASTRODUODENOSCOPY (EGD) WITH PROPOFOL;   Surgeon: Midge Minium, MD;  Location: [facility];  Service: Endoscopy;  Laterality: N/A;  Diabetic   IR BONE MARROW BIOPSY & ASPIRATION  04/27/2023   MEATOTOMY N/A 01/10/2023   Procedure: MEATOTOMY ADULT;  Surgeon: Marcine Matar, MD;  Location: [facility];  Service: Urology;  Laterality: N/A;   PARS PLANA VITRECTOMY Right 03/21/2021   Procedure: PARS PLANA VITRECTOMY 25 GAUGE WITH INJECTION OF ANTIBIOTICS FOR ENDOPHTHALMITIS;  Surgeon: Carmela Rima, MD;  Location: [facility] OR;  Service: Ophthalmology;  Laterality: Right;   POLYPECTOMY N/A 09/07/2021   Procedure: POLYPECTOMY;  Surgeon: Midge Minium, MD;  Location: [facility];  Service: Endoscopy;  Laterality: N/A;   REPAIR EXTENSOR TENDON Right 08/11/2022   Procedure: EXTENSOR CARPI ULNARIS TENDON DEBRIDEMENT, POSSIBLE SUBSHEATH RECONSTRUCTION;  Surgeon: Marlyne Beards, MD;  Location: [facility];  Service: Orthopedics;  Laterality: Right;  or regional plus MAC    SOCIAL HISTORY: Social History   Socioeconomic History   Marital status: Married    Spouse name: [name]   Number of children: 1   Years of education: 12   Highest education level: Not on file  Occupational History   Occupation: Architect: [employer]  Tobacco Use   Smoking status: Never   Smokeless tobacco: Former    Types: Snuff  Vaping Use   Vaping status: Never Used  Substance and Sexual Activity   Alcohol use: Not Currently    Comment: seldom   Drug use: Never   Sexual activity: Yes  Birth control/protection: Surgical  Other Topics Concern   Not on file  Social History Narrative   Lives with wife   [place]   Caffeine- diet Mtn Dew, 2 glasses   Works as Scientist, water quality for [employer]   Social Drivers of Health   Financial Resource Strain: Low Risk  (11/23/2023)   Received from [facility]   Overall Financial Resource Strain (CARDIA)     Difficulty of Paying Living Expenses: Not hard at all  Food Insecurity: No Food Insecurity (11/23/2023)   Received from [facility]   Hunger Vital Sign    Worried About Running Out of Food in the Last Year: Never true    Ran Out of Food in the Last Year: Never true  Transportation Needs: No Transportation Needs (11/23/2023)   Received from [facility] - Transportation    Lack of Transportation (Medical): No    Lack of Transportation (Non-Medical): No  Physical Activity: Patient Declined (02/08/2023)   Received from [facility]   Exercise Vital Sign    Days of Exercise per Week: Patient declined    Minutes of Exercise per Session: Patient declined  Stress: No Stress Concern Present (02/08/2023)   Received from [facility], [facility] - Occupational Stress Questionnaire    Feeling of Stress : Not at all  Social Connections: Socially Integrated (02/08/2023)   Received from [facility], [facility]   Social Network    How would you rate your social network (family, work, friends)?: Good participation with social networks  Intimate Partner Violence: Not At Risk (11/06/2023)   Humiliation, Afraid, Rape, and Kick questionnaire    Fear of Current or Ex-Partner: No    Emotionally Abused: No    Physically Abused: No    Sexually Abused: No    FAMILY HISTORY: Family History  Problem Relation Age of Onset   Aneurysm Mother        brain   Hypertension Mother    Cancer Father    Heart disease Father    Diabetes Father    Hypertension Father    Hyperlipidemia Father    Cancer Maternal Grandmother        melanoma   Heart disease Paternal Grandfather    Colon cancer Neg Hx     ALLERGIES:  is allergic to codeine.  MEDICATIONS:  Current Outpatient Medications  Medication Sig Dispense Refill   aspirin 81 MG EC tablet Take 81 mg by mouth daily.     atorvastatin (LIPITOR) 40 MG tablet Take 40 mg by mouth at  bedtime.     cloNIDine (CATAPRES) 0.2 MG tablet Take 1 tablet (0.2 mg total) by mouth 3 (three) times daily. (Patient taking differently: Take 0.2 mg by mouth 2 (two) times daily.) 90 tablet 1   empagliflozin (JARDIANCE) 10 MG TABS tablet Take 10 mg by mouth daily.     feeding supplement (ENSURE ENLIVE / ENSURE PLUS) LIQD Take 237 mLs by mouth 3 (three) times daily between meals. (Patient taking differently: Take 237 mLs by mouth every other day.)     furosemide (LASIX) 20 MG tablet Take 20 mg by mouth daily as needed for fluid or edema. prn     insulin glargine (LANTUS SOLOSTAR) 100 UNIT/ML Solostar Pen Inject 20 Units into the skin 2 (two) times daily. (Patient taking differently: Inject 35 Units into the skin 2 (two) times daily.)     lisinopril (  ZESTRIL) 10 MG tablet Take 10 mg by mouth daily.     pantoprazole (PROTONIX) 40 MG tablet Take 40 mg by mouth daily.     polyethylene glycol (MIRALAX / GLYCOLAX) 17 g packet Take 17 g by mouth daily as needed for mild constipation or moderate constipation (1 capful).     SODIUM FLUORIDE 5000 PPM 1.1 % CREA dental cream Place 1 Application onto teeth at bedtime.     amLODipine (NORVASC) 5 MG tablet Take 1 tablet (5 mg total) by mouth daily. (Patient not taking: Reported on 12/06/2023) 30 tablet 1   linaclotide (LINZESS) 290 MCG CAPS capsule Take 1 capsule (290 mcg total) by mouth daily before breakfast. (Patient not taking: Reported on 12/06/2023) 30 capsule 6   metoprolol tartrate (LOPRESSOR) 25 MG tablet Take 0.5 tablets (12.5 mg total) by mouth 2 (two) times daily. (Patient not taking: Reported on 12/06/2023) 60 tablet 1   sodium bicarbonate 650 MG tablet Take 1 tablet (650 mg total) by mouth 2 (two) times daily. (Patient not taking: Reported on 12/06/2023) 60 tablet 1   tirzepatide (MOUNJARO) 15 MG/0.5ML Pen Inject 15 mg into the skin once a week. (Patient not taking: Reported on 12/06/2023) 2 mL 3   No current facility-administered medications for this  visit.      PHYSICAL EXAMINATION:  S/p Suprapubic catheter. 2cm LN in right neck; Right enlarged tonsil.  Vitals:   12/12/23 0930  BP: 130/67  Pulse: 76  Resp: 20  Temp: 97.8 F (36.6 C)  SpO2: 100%     Filed Weights   12/12/23 0930  Weight: 177 lb (80.3 kg)      Physical Exam Vitals and nursing note reviewed.  Constitutional:      Comments:    HENT:     Head: Normocephalic and atraumatic.     Mouth/Throat:     Pharynx: Oropharynx is clear.  Eyes:     Extraocular Movements: Extraocular movements intact.     Pupils: Pupils are equal, round, and reactive to light.  Cardiovascular:     Rate and Rhythm: Normal rate and regular rhythm.  Pulmonary:     Comments: Decreased breath sounds bilaterally.  Abdominal:     Palpations: Abdomen is soft.  Musculoskeletal:        General: Normal range of motion.     Cervical back: Normal range of motion.  Skin:    General: Skin is warm.  Neurological:     General: No focal deficit present.     Mental Status: He is alert and oriented to person, place, and time.  Psychiatric:        Behavior: Behavior normal.        Judgment: Judgment normal.     LABORATORY DATA:  I have reviewed the data as listed Lab Results  Component Value Date   WBC 3.9 (L) 12/12/2023   HGB 7.4 (L) 12/12/2023   HCT 24.5 (L) 12/12/2023   MCV 101.7 (H) 12/12/2023   PLT 175 12/12/2023   Recent Labs    11/07/23 0427 11/08/23 0354 11/08/23 0355 11/09/23 0427 11/12/23 0449 11/13/23 0430 11/21/23 0853 12/09/23 1318 12/12/23 0920  NA 135  --  138   < > 149*   < > 136 137 139  K 4.7  --  3.7   < > 3.2*   < > 3.7 6.0* 5.0  CL 107  --  109   < > 124*   < > 103 108 111  CO2 20*  --  18*   < > 20*   < > 25 20* 21*  GLUCOSE 51*  --  87   < > 235*   < > 153* 279* 301*  BUN 74*  --  64*   < > 56*   < > 28* 35* 37*  CREATININE 4.41*  --  3.36*   < > 1.98*   < > 1.53* 2.53* 1.86*  CALCIUM 8.0*  --  8.3*   < > 8.2*   < > 8.5* 8.9 8.5*  GFRNONAA 14*   --  20*   < > 37*   < > 51* 28* 40*  PROT 4.9* 5.2*  --   --  5.6*  --   --   --   --   ALBUMIN 1.9* 1.9* 1.9*  --  2.0*  --   --   --   --   AST 114* 65*  --   --  13*  --   --   --   --   ALT 139* 116*  --   --  40  --   --   --   --   ALKPHOS 63 77  --   --  82  --   --   --   --   BILITOT 0.5 0.3  --   --  0.7  --   --   --   --   BILIDIR  --  0.1  --   --   --   --   --   --   --   IBILI  --  0.2*  --   --   --   --   --   --   --    < > = values in this interval not displayed.     No results found.  Symptomatic anemia #Anemia-symptomatic fatigue hemoglobin 9-10--likely multifactorial-iron malabsorption/celiac disease/CKD-stage MAY 2024- Bone marrow biopsy/aspiration- The bone marrow is generally normocellular for age with trilineage  hematopoiesis and nonspecific changes, likely secondary in nature in  this setting.  There is no morphologic evidence of a lymphoproliferative  process or plasma cell neoplasm.   # Anemia: SEP 2024-iron saturation- 19  ferritin700;  HOLD further retacrit  as patient is undergoing active chemotherapy for curable cancer.  HOLD venofer today  # 09/07/2023: Stage II (cT3, cN1, cM0, p16+)  -currently s/p definitive chemoradiation with CarboTaxol weekly [[facility]] on DEC 20th- awaiting follow up PET scan in March 2025.Defer to [facility] for further management.  # Ckd stage III [nephro, [place]]-diabetes- GFR- 40s-  stable  # Diabetes [[facility] Hb A1c- 7.4]-PBG 301 continue follow-up with endocrinology/monitoring of blood sugars. stable  # DISPOSITION: # NO Venofer today; NO retacrit # follow up in 2 months-; MD;  labs- cbc/bmp; ;possible venofer or retacrit-  -Dr.B  All questions were answered. The patient knows to call the clinic with any problems, questions or concerns.    Earna Coder, MD 12/12/2023 10:53 AM

## 2023-12-12 NOTE — Progress Notes (Signed)
Patient has no concerns

## 2023-12-12 NOTE — Progress Notes (Signed)
Case: [id number] Date/Time: 12/20/23 1315   Procedures:      CYSTOSCOPY - 30 MINUTE CASE     BOTOX INJECTION   Anesthesia type: Monitor Anesthesia Care   Pre-op diagnosis: OVERACTIVE BLADDER   [facility]   Surgeons: Noel Christmas, MD       DISCUSSION: Brian Nielsen is a 63 yo male who presents to PAT prior to procedure above. PMH of HTN,  tonsillar cancer currently undergoing chemo, GERD, IDDM, CKD, anemia, BPH, overactive bladder s/p suprapubic catheter placement in 05/2023.  Patient admitted from 12/22-12/30 due to AMS which was thought to be multifactorial due to UTI, opiates, AKI, hypotension. He was also diagnosed with aspiration pneumonitis.   VS: BP (!) 146/69   Pulse 63   Temp 36.8 C (Oral)   Ht 5\' 8"  (1.727 m)   Wt 74.4 kg   SpO2 100%   BMI 24.94 kg/m   PROVIDERS: Chyrl Civatte, FNP   LABS: {CHL AN LABS REVIEWED:112001::"Labs reviewed: Acceptable for surgery."} (all labs ordered are listed, but only abnormal results are displayed)  Labs Reviewed  BASIC METABOLIC PANEL - Abnormal; Notable for the following components:      Result Value   Potassium 6.0 (*)    CO2 20 (*)    Glucose, Bld 279 (*)    BUN 35 (*)    Creatinine, Ser 2.53 (*)    GFR, Estimated 28 (*)    All other components within normal limits  CBC - Abnormal; Notable for the following components:   RBC 2.73 (*)    Hemoglobin 8.3 (*)    HCT 27.3 (*)    RDW 19.0 (*)    All other components within normal limits  GLUCOSE, CAPILLARY - Abnormal; Notable for the following components:   Glucose-Capillary 263 (*)    All other components within normal limits     IMAGES:   EKG:   CV:  Past Medical History:  Diagnosis Date   Adult celiac disease 2015   followed by dr Servando Snare (GI)   Anemia associated with chronic renal failure    hematologist--- dr Sarajane Jews;  treated with iron infusions   Benign localized prostatic hyperplasia with lower urinary tract symptoms  (LUTS)    urologist--- dr Retta Diones   Cancer [facility]) 08/18/2023   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   Charcot foot due to diabetes mellitus (HCC)    Chronic constipation    CKD (chronic kidney disease), stage III [facility])    nephrologist--- dr Wolfgang Phoenix;  renal bx 11-20-2021   Diverticulosis of colon    Edema of both lower extremities    GERD (gastroesophageal reflux disease)    History of adenomatous polyp of colon    HTN (hypertension)    Hyperlipidemia, mixed    Peripheral neuropathy    Peyronie's disease    Pneumonia    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 meatal stricture    Retinopathy due to secondary diabetes mellitus (HCC)    both eyes  treated with injecitons   Thoracic spondylosis without myelopathy 11/22/2016   Type 2 diabetes mellitus [facility])    endocrinologist--- dr nida   Vitamin D deficiency    Wears hearing aid in both ears     Past Surgical History:  Procedure Laterality Date   BIOPSY N/A 09/07/2021   Procedure: BIOPSY;  Surgeon: Midge Minium, MD;  Location: [facility];  Service: Endoscopy;  Laterality: N/A;   CATARACT EXTRACTION W/ INTRAOCULAR LENS IMPLANT Bilateral 2023  COLONOSCOPY N/A 05/03/2014   Procedure: COLONOSCOPY;  Surgeon: West Bali, MD;  [facility];  Service: Endoscopy;  Laterality: N/A;  12:00   COLONOSCOPY WITH PROPOFOL N/A 09/07/2021   Procedure: COLONOSCOPY WITH PROPOFOL;  Surgeon: Midge Minium, MD;  Location: [facility];  Service: Endoscopy;  Laterality: N/A;   CYSTOSCOPY WITH INJECTION N/A 07/26/2023   Procedure: CYSTOSCOPY WITH BOTOX INJECTION 100 UNITS;  Surgeon: Noel Christmas, MD;  Location[facility];  Service: Urology;  Laterality: N/A;  30 minutes   CYSTOSCOPY WITH URETHRAL DILATATION N/A 01/10/2023   Procedure: CYSTOSCOPY;  Surgeon: Marcine Matar, MD;  Location: [facility];  Service: Urology;  Laterality: N/A;   ESOPHAGOGASTRODUODENOSCOPY N/A 05/03/2014   Procedure:  ESOPHAGOGASTRODUODENOSCOPY (EGD);  Surgeon: West Bali, MD;  [facility];  Service: Endoscopy;  Laterality: N/A;   ESOPHAGOGASTRODUODENOSCOPY (EGD) WITH PROPOFOL N/A 06/03/2020   Procedure: ESOPHAGOGASTRODUODENOSCOPY (EGD) WITH PROPOFOL;  Surgeon: Regis Bill, MD;  Location: [facility] ENDOSCOPY;  Service: Endoscopy;  Laterality: N/A;   ESOPHAGOGASTRODUODENOSCOPY (EGD) WITH PROPOFOL N/A 09/07/2021   Procedure: ESOPHAGOGASTRODUODENOSCOPY (EGD) WITH PROPOFOL;  Surgeon: Midge Minium, MD;  Location: [facility];  Service: Endoscopy;  Laterality: N/A;  Diabetic   IR BONE MARROW BIOPSY & ASPIRATION  04/27/2023   MEATOTOMY N/A 01/10/2023   Procedure: MEATOTOMY ADULT;  Surgeon: Marcine Matar, MD;  Location: [facility];  Service: Urology;  Laterality: N/A;   PARS PLANA VITRECTOMY Right 03/21/2021   Procedure: PARS PLANA VITRECTOMY 25 GAUGE WITH INJECTION OF ANTIBIOTICS FOR ENDOPHTHALMITIS;  Surgeon: Carmela Rima, MD;  Location: [facility] OR;  Service: Ophthalmology;  Laterality: Right;   POLYPECTOMY N/A 09/07/2021   Procedure: POLYPECTOMY;  Surgeon: Midge Minium, MD;  Location: [facility];  Service: Endoscopy;  Laterality: N/A;   REPAIR EXTENSOR TENDON Right 08/11/2022   Procedure: EXTENSOR CARPI ULNARIS TENDON DEBRIDEMENT, POSSIBLE SUBSHEATH RECONSTRUCTION;  Surgeon: Marlyne Beards, MD;  Location:  SURGERY CENTER;  Service: Orthopedics;  Laterality: Right;  or regional plus MAC    MEDICATIONS:  amLODipine (NORVASC) 5 MG tablet   aspirin 81 MG EC tablet   atorvastatin (LIPITOR) 40 MG tablet   cloNIDine (CATAPRES) 0.2 MG tablet   empagliflozin (JARDIANCE) 10 MG TABS tablet   feeding supplement (ENSURE ENLIVE / ENSURE PLUS) LIQD   furosemide (LASIX) 20 MG tablet   insulin glargine (LANTUS SOLOSTAR) 100 UNIT/ML Solostar Pen   linaclotide (LINZESS) 290 MCG CAPS capsule   lisinopril (ZESTRIL) 10 MG tablet   metoprolol tartrate (LOPRESSOR) 25  MG tablet   pantoprazole (PROTONIX) 40 MG tablet   polyethylene glycol (MIRALAX / GLYCOLAX) 17 g packet   sodium bicarbonate 650 MG tablet   SODIUM FLUORIDE 5000 PPM 1.1 % CREA dental cream   tirzepatide (MOUNJARO) 15 MG/0.5ML Pen   No current facility-administered medications for this encounter.

## 2023-12-19 ENCOUNTER — Telehealth: Payer: Self-pay | Admitting: Internal Medicine

## 2023-12-19 ENCOUNTER — Telehealth: Payer: Self-pay | Admitting: *Deleted

## 2023-12-19 ENCOUNTER — Encounter: Payer: Self-pay | Admitting: Internal Medicine

## 2023-12-19 NOTE — Telephone Encounter (Signed)
Tracked Dr Wolfgang Phoenix to Hypertension and Nephrology clinic of [facility]. Message left for him to call Dr Donneta Romberg to discuss this mutual patient.

## 2023-12-19 NOTE — Telephone Encounter (Signed)
Pt wife called and stated Dr. Wolfgang Phoenix advised this pt to reach out to his hemotology dr to review his labs and get him in for iron infusions and injection. Please advise

## 2023-12-19 NOTE — Telephone Encounter (Signed)
Spoke to Dr.Bhutani-most recent lab with her office was 8.6.  Couple of weeks ago hemoglobin here was 7.6-   Elevated ferritin more than thousand; and recent chemotherapy for curative intent-hold of iron infusion/and Retacrit. If hemoglobin less than 8 and patient symptomatic recommend blood transfusion.   Will continue to monitor-

## 2023-12-23 ENCOUNTER — Encounter: Payer: Self-pay | Admitting: Internal Medicine

## 2023-12-30 ENCOUNTER — Encounter (HOSPITAL_BASED_OUTPATIENT_CLINIC_OR_DEPARTMENT_OTHER): Payer: Self-pay | Admitting: Pulmonary Disease

## 2023-12-30 ENCOUNTER — Telehealth (HOSPITAL_BASED_OUTPATIENT_CLINIC_OR_DEPARTMENT_OTHER): Payer: BC Managed Care – PPO | Admitting: Pulmonary Disease

## 2023-12-30 DIAGNOSIS — G4733 Obstructive sleep apnea (adult) (pediatric): Secondary | ICD-10-CM | POA: Diagnosis not present

## 2023-12-30 NOTE — Progress Notes (Signed)
   Subjective:    Patient ID: Brian Nielsen, male    DOB: [birth year], 63 y.o.   MRN: [id number]  HPI  I connected with  DEUNTE BLEDSOE on 12/30/23 by phone/  video enabled telemedicine application and verified that I am speaking with the correct person using two identifiers.     Location: Patient: Home Provider: Office - [facility] - [employer] [place]   I discussed the limitations of evaluation and management by telemedicine and the availability of in person appointments. The patient expressed understanding and agreed to proceed. I also discussed with the patient that there may be a patient responsible charge related to this service. The patient expressed understanding and agreed to proceed.   Patient consented to consult via tele: Yes People present and their role in pt care: Pt    63 year old for follow-up of OSA     PMH - GERD  Hypertension Diabetes for 20 years CKD3 Suprapubic catheter for neurogenic bladder  throat cancer 07/2023   28-month follow-up visit  He was hospitalized 10/2023 with confusion, altered mental status found to have aspiration pneumonia AKI and Klebsiella UTI with fractured ribs from the fall.  He has a suprapubic catheter.  He has improved significantly since then.  He was weaned off opiates. He remains on clonidine and gabapentin. 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 Download shows residual AHI of 9 on auto CPAP settings 10 to 14 cm with average pressure 12.5 cm.    Significant tests/ events reviewed   HST 02/2023 showed moderate OSA with AHI 16/ hr, low sat 70% (Snap)  CPAP titration 04/2023 >> 14 cm  Review of Systems neg for any significant sore throat, dysphagia, itching, sneezing, nasal congestion or excess/ purulent secretions, fever, chills, sweats, unintended wt loss, pleuritic or exertional cp,  hempoptysis, orthopnea pnd or change in chronic leg swelling. Also denies presyncope, palpitations, heartburn, abdominal pain, nausea, vomiting, diarrhea or change in bowel or urinary habits, dysuria,hematuria, rash, arthralgias, visual complaints, headache, numbness weakness or ataxia.     Objective:   Physical Exam  No accessory muscle use. Able to speak in full sentences.        Assessment & Plan:    OSA  -he was unable to use CPAP due to radiation dermatitis over his face from radiation for throat cancer.  This is much improved he has gotten back on his machine. He is tolerating this well .  He does have residual events. Hypersomnolence was related to medications including opiates and he has been weaned off opiates now so I expect this to improve as he becomes more compliant with his CPAP machine.  We will recheck in 3 to 4 months with an in person visit at our office in [place]  Weight loss encouraged, compliance with goal of at least 4-6 hrs every night is the expectation. Advised against medications with sedative side effects Cautioned against driving when sleepy - understanding that sleepiness will vary on a day to day basis   Total encounter time 23 minutes

## 2024-01-02 ENCOUNTER — Other Ambulatory Visit (HOSPITAL_COMMUNITY): Payer: BC Managed Care – PPO

## 2024-01-06 ENCOUNTER — Encounter (HOSPITAL_COMMUNITY): Admission: RE | Admit: 2024-01-06 | Payer: BC Managed Care – PPO | Source: Ambulatory Visit

## 2024-01-10 NOTE — Progress Notes (Signed)
 COVID Vaccine Completed: yes  Date of COVID positive in last 90 days:  PCP - Caryl Comes, FNP Cardiologist -   Chest x-ray - 11/06/23 Epic EKG - 11/06/23 Epic Stress Test -  ECHO - 03/10/23 Epic Cardiac Cath -  Pacemaker/ICD device last checked: Spinal Cord Stimulator:  Bowel Prep -   Sleep Study -  CPAP -   Fasting Blood Sugar -  Checks Blood Sugar _____ times a day  Last dose of GLP1 agonist-  N/A GLP1 instructions:  Hold 7 days before surgery    Last dose of SGLT-2 inhibitors-  N/A SGLT-2 instructions:  Hold 3 days before surgery    Blood Thinner Instructions:  Last dose:   Time: Aspirin Instructions: ASA 81 Last Dose:  Activity level:  Can go up a flight of stairs and perform activities of daily living without stopping and without symptoms of chest pain or shortness of breath.  Able to exercise without symptoms  Unable to go up a flight of stairs without symptoms of     Anesthesia review: recent pneumonia, HTN, DM2, OSA, CKD, anemia  Patient denies shortness of breath, fever, cough and chest pain at PAT appointment  Patient verbalized understanding of instructions that were given to them at the PAT appointment. Patient was also instructed that they will need to review over the PAT instructions again at home before surgery.

## 2024-01-11 ENCOUNTER — Encounter (HOSPITAL_COMMUNITY): Payer: Self-pay

## 2024-01-11 ENCOUNTER — Encounter (HOSPITAL_COMMUNITY)
Admission: RE | Admit: 2024-01-11 | Discharge: 2024-01-11 | Disposition: A | Discharge status: Home / Self Care | Payer: BC Managed Care – PPO | Source: Ambulatory Visit | Attending: Urology | Admitting: Urology

## 2024-01-11 ENCOUNTER — Other Ambulatory Visit: Payer: Self-pay

## 2024-01-11 ENCOUNTER — Other Ambulatory Visit (HOSPITAL_COMMUNITY): Payer: Self-pay

## 2024-01-11 VITALS — BP 127/71 | HR 65 | Temp 98.3°F | Resp 14 | Ht 68.0 in | Wt 177.0 lb

## 2024-01-11 DIAGNOSIS — E1122 Type 2 diabetes mellitus with diabetic chronic kidney disease: Secondary | ICD-10-CM | POA: Diagnosis not present

## 2024-01-11 DIAGNOSIS — N183 Chronic kidney disease, stage 3 unspecified: Secondary | ICD-10-CM | POA: Diagnosis not present

## 2024-01-11 DIAGNOSIS — Z01812 Encounter for preprocedural laboratory examination: Secondary | ICD-10-CM | POA: Insufficient documentation

## 2024-01-11 LAB — BASIC METABOLIC PANEL
Anion gap: 10 (ref 5–15)
BUN: 32 mg/dL — ABNORMAL HIGH (ref 8–23)
CO2: 22 mmol/L (ref 22–32)
Calcium: 9 mg/dL (ref 8.9–10.3)
Chloride: 110 mmol/L (ref 98–111)
Creatinine, Ser: 1.77 mg/dL — ABNORMAL HIGH (ref 0.61–1.24)
GFR, Estimated: 43 mL/min — ABNORMAL LOW (ref >60)
Glucose, Bld: 200 mg/dL — ABNORMAL HIGH (ref 70–99)
Potassium: 4.2 mmol/L (ref 3.5–5.1)
Sodium: 142 mmol/L (ref 135–145)

## 2024-01-11 LAB — CBC
HCT: 28.4 % — ABNORMAL LOW (ref 39.0–52.0)
Hemoglobin: 8.4 g/dL — ABNORMAL LOW (ref 13.0–17.0)
MCH: 31.1 pg (ref 26.0–34.0)
MCHC: 29.6 g/dL — ABNORMAL LOW (ref 30.0–36.0)
MCV: 105.2 fL — ABNORMAL HIGH (ref 80.0–100.0)
Platelets: 182 10*3/uL (ref 150–400)
RBC: 2.7 MIL/uL — ABNORMAL LOW (ref 4.22–5.81)
RDW: 15.2 % (ref 11.5–15.5)
WBC: 4.5 10*3/uL (ref 4.0–10.5)
nRBC: 0 % (ref 0.0–0.2)

## 2024-01-11 LAB — GLUCOSE, CAPILLARY: Glucose-Capillary: 177 mg/dL — ABNORMAL HIGH (ref 70–99)

## 2024-01-11 NOTE — Patient Instructions (Addendum)
 SURGICAL WAITING ROOM VISITATION  Patients having surgery or a procedure may have no more than 2 support people in the waiting area - these visitors may rotate.    Children under the age of 76 must have an adult with them who is not the patient.  Due to an increase in RSV and influenza rates and associated hospitalizations, children ages 28 and under may not visit patients in [facility] hospitals.  Visitors with respiratory illnesses are discouraged from visiting and should remain at home.  If the patient needs to stay at the hospital during part of their recovery, the visitor guidelines for inpatient rooms apply. Pre-op nurse will coordinate an appropriate time for 1 support person to accompany patient in pre-op.  This support person may not rotate.    Please refer to the [facility] website for the visitor guidelines for Inpatients (after your surgery is over and you are in a regular room).    Your procedure is scheduled on: 01/19/24   Report to [facility] Main Entrance    Report to admitting at 6:45 AM   Call this number if you have problems the morning of surgery [phone]   Do not eat food or drink liquids :After Midnight.          If you have questions, please contact your surgeon's office.   FOLLOW BOWEL PREP AND ANY ADDITIONAL PRE OP INSTRUCTIONS YOU RECEIVED FROM YOUR SURGEON'S OFFICE!!!     Oral Hygiene is also important to reduce your risk of infection.                                    Remember - BRUSH YOUR TEETH THE MORNING OF SURGERY WITH YOUR REGULAR TOOTHPASTE  DENTURES WILL BE REMOVED PRIOR TO SURGERY PLEASE DO NOT APPLY "Poly grip" OR ADHESIVES!!!   Stop all vitamins and herbal supplements 7 days before surgery.   Take these medicines the morning of surgery with A SIP OF WATER: Clonidine, Gabapentin, Pantoprazole   DO NOT TAKE ANY ORAL DIABETIC MEDICATIONS DAY OF YOUR SURGERY  How to Manage Your Diabetes Before and After Surgery  Why is it  important to control my blood sugar before and after surgery? Improving blood sugar levels before and after surgery helps healing and can limit problems. A way of improving blood sugar control is eating a healthy diet by:  Eating less sugar and carbohydrates  Increasing activity/exercise  Talking with your doctor about reaching your blood sugar goals High blood sugars (greater than 180 mg/dL) can raise your risk of infections and slow your recovery, so you will need to focus on controlling your diabetes during the weeks before surgery. Make sure that the doctor who takes care of your diabetes knows about your planned surgery including the date and location.  How do I manage my blood sugar before surgery? Check your blood sugar at least 4 times a day, starting 2 days before surgery, to make sure that the level is not too high or low. Check your blood sugar the morning of your surgery when you wake up and every 2 hours until you get to the Short Stay unit. If your blood sugar is less than 70 mg/dL, you will need to treat for low blood sugar: Do not take insulin. Treat a low blood sugar (less than 70 mg/dL) with  cup of clear juice (cranberry or apple), 4 glucose tablets, OR glucose  gel. Recheck blood sugar in 15 minutes after treatment (to make sure it is greater than 70 mg/dL). If your blood sugar is not greater than 70 mg/dL on recheck, call [phone] for further instructions. Report your blood sugar to the short stay nurse when you get to Short Stay.  If you are admitted to the hospital after surgery: Your blood sugar will be checked by the staff and you will probably be given insulin after surgery (instead of oral diabetes medicines) to make sure you have good blood sugar levels. The goal for blood sugar control after surgery is 80-180 mg/dL.   WHAT DO I DO ABOUT MY DIABETES MEDICATION?  Do not take oral diabetes medicines (pills) the morning of surgery.  Hold Mounjaro  01/13/24.  Hold Jardiance for 3 days. Last dose 01/15/24.  THE DAY BEFORE SURGERY, take morning dose of Lantus as prescribed. Take 50% of evening dose.     THE MORNING OF SURGERY, take 50% of Lantus.  DO NOT TAKE THE FOLLOWING 7 DAYS PRIOR TO SURGERY: Ozempic, Wegovy, Rybelsus (Semaglutide), Byetta (exenatide), Bydureon (exenatide ER), Victoza, Saxenda (liraglutide), or Trulicity (dulaglutide) Mounjaro (Tirzepatide) Adlyxin (Lixisenatide), Polyethylene Glycol Loxenatide.  Reviewed and Endorsed by [facility] Patient Education Committee, August 2015  Bring CPAP mask and tubing day of surgery.                              You may not have any metal on your body including jewelry, and body piercing             Do not wear lotions, powders, cologne, or deodorant              Men may shave face and neck.   Do not bring valuables to the hospital. [facility] IS NOT             RESPONSIBLE   FOR VALUABLES.   Contacts, glasses, dentures or bridgework may not be worn into surgery.  DO NOT BRING YOUR HOME MEDICATIONS TO THE HOSPITAL. PHARMACY WILL DISPENSE MEDICATIONS LISTED ON YOUR MEDICATION LIST TO YOU DURING YOUR ADMISSION IN THE HOSPITAL!    Patients discharged on the day of surgery will not be allowed to drive home.  Someone NEEDS to stay with you for the first 24 hours after anesthesia.   Special Instructions: Bring a copy of your healthcare power of attorney and living will documents the day of surgery if you haven't scanned them before.              Please read over the following fact sheets you were given: IF YOU HAVE QUESTIONS ABOUT YOUR PRE-OP INSTRUCTIONS PLEASE CALL [phone] [place]    If you received a COVID test during your pre-op visit  it is requested that you wear a mask when out in public, stay away from anyone that may not be feeling well and notify your surgeon if you develop symptoms. If you test positive for Covid or have been in contact with anyone that has tested  positive in the last 10 days please notify you surgeon.    [place] - Preparing for Surgery Before surgery, you can play an important role.  Because skin is not sterile, your skin needs to be as free of germs as possible.  You can reduce the number of germs on your skin by washing with CHG (chlorahexidine gluconate) soap before surgery.  CHG is an antiseptic cleaner which kills  germs and bonds with the skin to continue killing germs even after washing. Please DO NOT use if you have an allergy to CHG or antibacterial soaps.  If your skin becomes reddened/irritated stop using the CHG and inform your nurse when you arrive at Short Stay. Do not shave (including legs and underarms) for at least 48 hours prior to the first CHG shower.  You may shave your face/neck.  Please follow these instructions carefully:  1.  Shower with CHG Soap the night before surgery and the  morning of surgery.  2.  If you choose to wash your hair, wash your hair first as usual with your normal  shampoo.  3.  After you shampoo, rinse your hair and body thoroughly to remove the shampoo.                             4.  Use CHG as you would any other liquid soap.  You can apply chg directly to the skin and wash.  Gently with a scrungie or clean washcloth.  5.  Apply the CHG Soap to your body ONLY FROM THE NECK DOWN.   Do   not use on face/ open                           Wound or open sores. Avoid contact with eyes, ears mouth and   genitals (private parts).                       Wash face,  Genitals (private parts) with your normal soap.             6.  Wash thoroughly, paying special attention to the area where your    surgery  will be performed.  7.  Thoroughly rinse your body with warm water from the neck down.  8.  DO NOT shower/wash with your normal soap after using and rinsing off the CHG Soap.                9.  Pat yourself dry with a clean towel.            10.  Wear clean pajamas.            11.  Place clean sheets  on your bed the night of your first shower and do not  sleep with pets. Day of Surgery : Do not apply any lotions/deodorants the morning of surgery.  Please wear clean clothes to the hospital/surgery center.  FAILURE TO FOLLOW THESE INSTRUCTIONS MAY RESULT IN THE CANCELLATION OF YOUR SURGERY  PATIENT SIGNATURE_________________________________  NURSE SIGNATURE__________________________________  ________________________________________________________________________

## 2024-01-11 NOTE — Progress Notes (Signed)
 Hgb 8.4 and creatinine 1.77. Results routed to Dr.Pace

## 2024-01-12 ENCOUNTER — Encounter (HOSPITAL_COMMUNITY)
Admission: RE | Disposition: A | Discharge status: Home / Self Care | Payer: Self-pay | Source: Home / Self Care | Attending: Urology

## 2024-01-12 LAB — HEMOGLOBIN A1C
Hgb A1c MFr Bld: 6 % — ABNORMAL HIGH (ref 4.8–5.6)
Mean Plasma Glucose: 126 mg/dL

## 2024-01-12 SURGERY — CYSTOSCOPY
Anesthesia: Monitor Anesthesia Care

## 2024-01-12 NOTE — Anesthesia Preprocedure Evaluation (Addendum)
 Anesthesia Evaluation  Patient identified by MRN, date of birth, ID band Patient awake    Reviewed: Allergy & Precautions, NPO status , Patient's Chart, lab work & pertinent test results, reviewed documented beta blocker date and time   History of Anesthesia Complications Negative for: history of anesthetic complications  Airway Mallampati: II  TM Distance: >3 FB Neck ROM: Full    Dental  (+) Chipped, Dental Advisory Given   Pulmonary sleep apnea and Continuous Positive Airway Pressure Ventilation    breath sounds clear to auscultation       Cardiovascular hypertension, Pt. on medications and Pt. on home beta blockers (-) angina  Rhythm:Regular Rate:Normal  '24 ECHO: EF 60-65%, normal LVF, normal RVF, no significant valvular abnormalities   Neuro/Psych negative neurological ROS     GI/Hepatic Neg liver ROS,GERD  Controlled,,  Endo/Other  diabetes (glu 175), Insulin Dependent  Mounjaro BMI 27  Renal/GU Renal InsufficiencyRenal disease Bladder dysfunction (suprapubic tube)      Musculoskeletal  (+) Arthritis ,    Abdominal   Peds  Hematology  (+) Blood dyscrasia (Hb 8.4, plt 182k), anemia   Anesthesia Other Findings Tonsillar cancer: chemo, XRT  Reproductive/Obstetrics                             Anesthesia Physical Anesthesia Plan  ASA: 3  Anesthesia Plan: MAC   Post-op Pain Management: Tylenol PO (pre-op)*   Induction:   PONV Risk Score and Plan: 1 and Ondansetron and Dexamethasone  Airway Management Planned: Natural Airway and Simple Face Mask  Additional Equipment: None  Intra-op Plan:   Post-operative Plan:   Informed Consent: I have reviewed the patients History and Physical, chart, labs and discussed the procedure including the risks, benefits and alternatives for the proposed anesthesia with the patient or authorized representative who has indicated his/her  understanding and acceptance.     Dental advisory given  Plan Discussed with: CRNA and Surgeon  Anesthesia Plan Comments: (Originally reviewed on 1/24. Postponed due to PNA, now recovered. PMH of HTN, OSA (uses CPAP), tonsillar cancer s/p chemoradiation, GERD, IDDM (A1c 6), CKD, anemia, BPH, overactive bladder s/p suprapubic catheter placement in 05/2023, chronic pain with narcotic dependence. CKD and anemia are at baseline.)        Anesthesia Quick Evaluation

## 2024-01-19 ENCOUNTER — Encounter (HOSPITAL_COMMUNITY)
Admission: RE | Disposition: A | Discharge status: Home / Self Care | Payer: Self-pay | Source: Home / Self Care | Attending: Urology

## 2024-01-19 ENCOUNTER — Ambulatory Visit (HOSPITAL_COMMUNITY)
Admission: RE | Admit: 2024-01-19 | Discharge: 2024-01-19 | Disposition: A | Discharge status: Home / Self Care | Payer: BC Managed Care – PPO | Attending: Urology | Admitting: Urology

## 2024-01-19 ENCOUNTER — Other Ambulatory Visit: Payer: Self-pay

## 2024-01-19 ENCOUNTER — Encounter (HOSPITAL_COMMUNITY): Payer: Self-pay | Admitting: Urology

## 2024-01-19 ENCOUNTER — Ambulatory Visit (HOSPITAL_COMMUNITY): Payer: Self-pay | Admitting: Medical

## 2024-01-19 DIAGNOSIS — Z7985 Long-term (current) use of injectable non-insulin antidiabetic drugs: Secondary | ICD-10-CM | POA: Insufficient documentation

## 2024-01-19 DIAGNOSIS — E119 Type 2 diabetes mellitus without complications: Secondary | ICD-10-CM | POA: Diagnosis not present

## 2024-01-19 DIAGNOSIS — C760 Malignant neoplasm of head, face and neck: Secondary | ICD-10-CM | POA: Insufficient documentation

## 2024-01-19 DIAGNOSIS — N3941 Urge incontinence: Secondary | ICD-10-CM | POA: Insufficient documentation

## 2024-01-19 DIAGNOSIS — Z794 Long term (current) use of insulin: Secondary | ICD-10-CM | POA: Diagnosis not present

## 2024-01-19 DIAGNOSIS — I10 Essential (primary) hypertension: Secondary | ICD-10-CM | POA: Insufficient documentation

## 2024-01-19 DIAGNOSIS — K219 Gastro-esophageal reflux disease without esophagitis: Secondary | ICD-10-CM | POA: Diagnosis not present

## 2024-01-19 DIAGNOSIS — G4730 Sleep apnea, unspecified: Secondary | ICD-10-CM | POA: Diagnosis not present

## 2024-01-19 DIAGNOSIS — N183 Chronic kidney disease, stage 3 unspecified: Secondary | ICD-10-CM

## 2024-01-19 DIAGNOSIS — F17220 Nicotine dependence, chewing tobacco, uncomplicated: Secondary | ICD-10-CM | POA: Diagnosis not present

## 2024-01-19 HISTORY — PX: CYSTOSCOPY: SHX5120

## 2024-01-19 HISTORY — PX: BOTOX INJECTION: SHX5754

## 2024-01-19 LAB — GLUCOSE, CAPILLARY
Glucose-Capillary: 118 mg/dL — ABNORMAL HIGH (ref 70–99)
Glucose-Capillary: 175 mg/dL — ABNORMAL HIGH (ref 70–99)
Glucose-Capillary: 98 mg/dL (ref 70–99)

## 2024-01-19 SURGERY — CYSTOSCOPY
Anesthesia: Monitor Anesthesia Care | Site: Urethra

## 2024-01-19 MED ORDER — LACTATED RINGERS IV SOLN: INTRAVENOUS | Status: DC | PRN

## 2024-01-19 MED ORDER — GLYCOPYRROLATE 0.2 MG/ML IJ SOLN
INTRAMUSCULAR | Status: DC | PRN
Start: 2024-01-19 — End: 2024-01-19
  Administered 2024-01-19: .2 mg via INTRAVENOUS

## 2024-01-19 MED ORDER — ONABOTULINUMTOXINA 100 UNITS IJ SOLR
INTRAMUSCULAR | Status: AC
  Filled 2024-01-19: qty 100

## 2024-01-19 MED ORDER — ACETAMINOPHEN 500 MG PO TABS
1000.0000 mg | ORAL_TABLET | Freq: Once | ORAL | Status: AC
  Administered 2024-01-19: 1000 mg via ORAL
  Filled 2024-01-19: qty 2

## 2024-01-19 MED ORDER — PHENYLEPHRINE 80 MCG/ML (10ML) SYRINGE FOR IV PUSH (FOR BLOOD PRESSURE SUPPORT)
PREFILLED_SYRINGE | INTRAVENOUS | Status: AC
  Filled 2024-01-19: qty 10

## 2024-01-19 MED ORDER — MIDAZOLAM HCL 2 MG/2ML IJ SOLN
INTRAMUSCULAR | Status: AC
  Filled 2024-01-19: qty 2

## 2024-01-19 MED ORDER — LIDOCAINE HCL (PF) 2 % IJ SOLN
INTRAMUSCULAR | Status: DC | PRN
  Administered 2024-01-19: 100 mg via INTRADERMAL

## 2024-01-19 MED ORDER — CHLORHEXIDINE GLUCONATE 0.12 % MT SOLN: 15.0000 mL | Freq: Once | OROMUCOSAL | Status: DC

## 2024-01-19 MED ORDER — ORAL CARE MOUTH RINSE: 15.0000 mL | Freq: Once | OROMUCOSAL | Status: DC

## 2024-01-19 MED ORDER — ONDANSETRON HCL 4 MG/2ML IJ SOLN
INTRAMUSCULAR | Status: AC
  Filled 2024-01-19: qty 2

## 2024-01-19 MED ORDER — MIDAZOLAM HCL 2 MG/2ML IJ SOLN
INTRAMUSCULAR | Status: DC | PRN
Start: 2024-01-19 — End: 2024-01-19
  Administered 2024-01-19: 2 mg via INTRAVENOUS

## 2024-01-19 MED ORDER — SODIUM CHLORIDE (PF) 0.9 % IJ SOLN
INTRAMUSCULAR | Status: DC | PRN
  Administered 2024-01-19: 30 mL

## 2024-01-19 MED ORDER — ONABOTULINUMTOXINA 100 UNITS IJ SOLR
INTRAMUSCULAR | Status: DC | PRN
Start: 2024-01-19 — End: 2024-01-19
  Administered 2024-01-19: 200 [IU]

## 2024-01-19 MED ORDER — CHLORHEXIDINE GLUCONATE 0.12 % MT SOLN
15.0000 mL | Freq: Once | OROMUCOSAL | Status: AC
  Administered 2024-01-19: 15 mL via OROMUCOSAL

## 2024-01-19 MED ORDER — ORAL CARE MOUTH RINSE: 15.0000 mL | Freq: Once | OROMUCOSAL | Status: AC

## 2024-01-19 MED ORDER — PROPOFOL 500 MG/50ML IV EMUL
INTRAVENOUS | Status: DC | PRN
  Administered 2024-01-19: 80 ug/kg/min via INTRAVENOUS

## 2024-01-19 MED ORDER — PROPOFOL 1000 MG/100ML IV EMUL
INTRAVENOUS | Status: AC
  Filled 2024-01-19: qty 100

## 2024-01-19 MED ORDER — CEFAZOLIN SODIUM-DEXTROSE 2-4 GM/100ML-% IV SOLN
2.0000 g | INTRAVENOUS | Status: AC
  Administered 2024-01-19: 2 g via INTRAVENOUS
  Filled 2024-01-19: qty 100

## 2024-01-19 MED ORDER — KETAMINE HCL 50 MG/5ML IJ SOSY
PREFILLED_SYRINGE | INTRAMUSCULAR | Status: AC
  Filled 2024-01-19: qty 5

## 2024-01-19 MED ORDER — ONDANSETRON HCL 4 MG/2ML IJ SOLN
INTRAMUSCULAR | Status: DC | PRN
  Administered 2024-01-19: 4 mg via INTRAVENOUS

## 2024-01-19 MED ORDER — KETAMINE HCL 50 MG/5ML IJ SOSY
PREFILLED_SYRINGE | INTRAMUSCULAR | Status: DC | PRN
Start: 2024-01-19 — End: 2024-01-19
  Administered 2024-01-19: 10 mg via INTRAVENOUS
  Administered 2024-01-19: 20 mg via INTRAVENOUS

## 2024-01-19 MED ORDER — LIDOCAINE HCL (PF) 2 % IJ SOLN
INTRAMUSCULAR | Status: AC
  Filled 2024-01-19: qty 5

## 2024-01-19 MED ORDER — INSULIN ASPART 100 UNIT/ML IJ SOLN
0.0000 [IU] | INTRAMUSCULAR | Status: DC | PRN
  Administered 2024-01-19: 2 [IU] via SUBCUTANEOUS
  Filled 2024-01-19: qty 1

## 2024-01-19 MED ORDER — STERILE WATER FOR IRRIGATION IR SOLN
Status: DC | PRN
  Administered 2024-01-19: 3000 mL

## 2024-01-19 MED ORDER — PROPOFOL 10 MG/ML IV BOLUS
INTRAVENOUS | Status: DC | PRN
  Administered 2024-01-19 (×2): 50 mg via INTRAVENOUS

## 2024-01-19 MED ORDER — SODIUM CHLORIDE (PF) 0.9 % IJ SOLN
INTRAMUSCULAR | Status: AC
  Filled 2024-01-19: qty 10

## 2024-01-19 MED ORDER — SODIUM CHLORIDE (PF) 0.9 % IJ SOLN
INTRAMUSCULAR | Status: AC
  Filled 2024-01-19: qty 20

## 2024-01-19 MED ORDER — LACTATED RINGERS IV SOLN: INTRAVENOUS | Status: DC

## 2024-01-19 MED ORDER — FENTANYL CITRATE (PF) 100 MCG/2ML IJ SOLN
INTRAMUSCULAR | Status: AC
  Filled 2024-01-19: qty 2

## 2024-01-19 SURGICAL SUPPLY — 20 items
BAG URO CATCHER STRL LF (MISCELLANEOUS) ×1 IMPLANT
CATH FOLEY 2WAY SLVR 5CC 16FR (CATHETERS) IMPLANT
CATH URETL OPEN END 6FR 70 (CATHETERS) ×1 IMPLANT
CLOTH BEACON ORANGE TIMEOUT ST (SAFETY) ×1 IMPLANT
ELECT REM PT RETURN 15FT ADLT (MISCELLANEOUS) ×1 IMPLANT
GLOVE BIO SURGEON STRL SZ 6.5 (GLOVE) ×1 IMPLANT
GOWN STRL REUS W/ TWL LRG LVL3 (GOWN DISPOSABLE) ×1 IMPLANT
GUIDEWIRE STR DUAL SENSOR (WIRE) ×1 IMPLANT
KIT TURNOVER KIT A (KITS) ×1 IMPLANT
MANIFOLD NEPTUNE II (INSTRUMENTS) ×1 IMPLANT
NDL ASPIRATION 22 (NEEDLE) ×1 IMPLANT
NDL SAFETY ECLIPSE 18X1.5 (NEEDLE) ×1 IMPLANT
NEEDLE ASPIRATION 22 (NEEDLE) ×1 IMPLANT
PACK CYSTO (CUSTOM PROCEDURE TRAY) ×1 IMPLANT
PLUG CATH AND CAP STRL 200 (CATHETERS) IMPLANT
SYR 20ML LL LF (SYRINGE) ×1 IMPLANT
SYR CONTROL 10ML LL (SYRINGE) ×1 IMPLANT
TUBING CONNECTING 10 (TUBING) ×1 IMPLANT
TUBING UROLOGY SET (TUBING) ×1 IMPLANT
WATER STERILE IRR 3000ML UROMA (IV SOLUTION) ×1 IMPLANT

## 2024-01-19 NOTE — Interval H&P Note (Signed)
 History and Physical Interval Note:  01/19/2024 9:00 AM  Brian Nielsen  has presented today for surgery, with the diagnosis of OVERACTIVE BLADDER.  The various methods of treatment have been discussed with the patient and family. After consideration of risks, benefits and other options for treatment, the patient has consented to  Procedure(s) with comments: CYSTOSCOPY (N/A) - 30 MINUTE CASE BOTOX INJECTION (N/A) as a surgical intervention.  The patient's history has been reviewed, patient examined, no change in status, stable for surgery.  I have reviewed the patient's chart and labs.  Questions were answered to the patient's satisfaction.     Jaeline Whobrey D Jeylin Woodmansee

## 2024-01-19 NOTE — Anesthesia Procedure Notes (Signed)
 Procedure Name: MAC Date/Time: 01/19/2024 11:00 AM  Performed by: Micki Riley, CRNAPre-anesthesia Checklist: Patient identified, Emergency Drugs available, Suction available, Patient being monitored and Timeout performed Patient Re-evaluated:Patient Re-evaluated prior to induction Oxygen Delivery Method: Circle system utilized Preoxygenation: Pre-oxygenation with 100% oxygen Induction Type: IV induction Airway Equipment and Method: Oral airway

## 2024-01-19 NOTE — OR Nursing (Signed)
 Patient states he drank ensure nutritional shake at 0500 due to blood sugar being 82. Called Dr. Jean Rosenthal, and per MD will need to delay surgery for 6 hours post ensure.

## 2024-01-19 NOTE — Discharge Instructions (Signed)
 Cystoscopy with Botox patient instructions  You may have bloody urine for two to three days (Call your doctor if the amount of bleeding increases or does not subside).  You may pass blood clots in your urine, especially if you had a biopsy. It is not unusual to pass small blood clots and have some bloody urine a couple of weeks after your cystoscopy. Again, call your doctor if the bleeding does not subside. You may have: Dysuria (painful urination) Frequency (urinating often) Urgency (strong desire to urinate)  These symptoms are common especially if medicine is instilled into the bladder or a ureteral stent is placed. Avoiding alcohol and caffeine, such as coffee, tea, and chocolate, may help relieve these symptoms. Drink plenty of water, unless otherwise instructed. Your doctor may also prescribe an antibiotic or other medicine to reduce these symptoms.  Cystoscopy results are available soon after the procedure; biopsy results usually take two to four days. Your doctor will discuss the results of your exam with you. Before you go home, you will be given specific instructions for follow-up care. Special Instructions:   If you are going home with a catheter in place do not take a tub bath until removed by your doctor.   You may resume your normal activities.   Do not drive or operate machinery if you are taking narcotic pain medicine.   Be sure to keep all follow-up appointments with your doctor.   Call Your Doctor If: The catheter is not draining You have severe pain You are unable to urinate You have a fever over 101 You have severe bleeding

## 2024-01-19 NOTE — Anesthesia Postprocedure Evaluation (Signed)
 Anesthesia Post Note  Patient: Brian Nielsen  Procedure(s) Performed: CYSTOSCOPY,SUPRAPUBIC TUBE EXCHANGE (Urethra) BOTOX INJECTION (Urethra)     Patient location during evaluation: PACU Anesthesia Type: MAC Level of consciousness: awake and alert, patient cooperative and oriented Pain management: pain level controlled Vital Signs Assessment: post-procedure vital signs reviewed and stable Respiratory status: spontaneous breathing, nonlabored ventilation and respiratory function stable Cardiovascular status: stable and blood pressure returned to baseline Postop Assessment: no apparent nausea or vomiting Anesthetic complications: no   No notable events documented.  Last Vitals:  Vitals:   01/19/24 1145 01/19/24 1200  BP: 116/61 (!) 140/67  Pulse: (!) 51 (!) 51  Resp: 12 12  Temp:  36.5 C  SpO2: 99% 100%    Last Pain:  Vitals:   01/19/24 1200  TempSrc:   PainSc: 0-No pain                 Blimi Godby,E. Chonita Gadea

## 2024-01-19 NOTE — Transfer of Care (Signed)
 Immediate Anesthesia Transfer of Care Note  Patient: Brian Nielsen  Procedure(s) Performed: CYSTOSCOPY,SUPRAPUBIC TUBE EXCHANGE (Urethra) BOTOX INJECTION (Urethra)  Patient Location: PACU  Anesthesia Type:MAC  Level of Consciousness: sedated and responds to stimulation  Airway & Oxygen Therapy: Patient Spontanous Breathing and Patient connected to face mask oxygen  Post-op Assessment: Report given to RN  Post vital signs: stable  Last Vitals:  Vitals Value Taken Time  BP 121/63 01/19/24 1134  Temp    Pulse 52 01/19/24 1135  Resp 15 01/19/24 1135  SpO2 100 % 01/19/24 1135  Vitals shown include unfiled device data.  Last Pain:  Vitals:   01/19/24 0714  TempSrc:   PainSc: 0-No pain         Complications: No notable events documented.

## 2024-01-19 NOTE — H&P (Signed)
 CC/HPI: cc: meatal stenosis, LUTs, spraying of stream   02/04/23: 63 year old man who recently underwent metatoplasty and diagnostic cystoscopy on 01/10/23 by Dr. Retta Diones now returns with continued LUTS of spraying of urinary stream as well as weak urinary stream. He is on Uroxatrol but does not feel that this has made a difference. He is continuing self dilation in the morning to keep meatus open. He has not felt symptoms have improved after surgery. He also has nocturia that is bothersome. He often needs to sit down to pee so that he does not spray all over his clothes.   03/16/2023: Here for diagnostic cystoscopy. Patient stopped self dilating because he feels like it did not make a difference in spraying during urination.   04/20/2023: 63 year old man with weak urinary stream and feeling of incomplete emptying here for follow-up. He had a urodynamic study that showed decreased urinary sensation and increased bladder capacity. He also was not able to generate a voluntary contraction and void. He voids approximately 1-2 times a day. He will have periods where he has increased frequency and have to void several times overnight to empty. It will take him a long time to try and empty his bladder.   UDS SUMMARY  [name] held a max capacity of approx. 1000 mls. His 1st sensation was felt at 792 mls. No instability was noted. He did not generate a voluntary contraction and void. He attempted for several minutes. He tried straiing, using crede method, and rocking his pelvis back and forth but these maneuvers did not help him. Trabeculation was noted. No reflux was seen. His bladder was completely drained before he left the clinic. He will return for UDS follow up.   05/03/2023: 63 year old man with a history of weak urinary stream, feeling of incomplete emptying and mucinosis here for follow-up. Urodynamics study showed an increased bladder capacity of 1 L and decree sensation. Patient is having an increasing we  difficult time emptying his bladder. At his last visit we discussed CIC versus a suprapubic tube. Patient would very much like to proceed with a suprapubic tube.    05/16/2023: 63 year old man with a weak urinary stream and incomplete emptying found to have poorly contracting bladder on UDS here for follow up. He underwent SPT placement by IR last week and now has questions about care. He is not voiding per urethra at all. He does have occasional urgency but is able to suppress it. He is having some burning pain in his right flank and lower abdomen area.   06/27/23: 63 year old man with a history of weak urinary stream, feeling of incomplete emptying, meatal stenosis who underwent suprapubic tube placement by IR here for SP tube change. He has been taking Gemtesa and Detrol for urgency but it is not helping him. He feels like he is sitting on something when he sits down.   07/11/2023: [name] is a 63 year old man who presents today for discussion of more comfortable set up of his suprapubic catheter bag. He does not like wearing the catheter bag on his thigh. He states it causes him discomfort. He would like a bag that reaches down to his calf. He has upcoming surgery scheduled on 07/26/2023 for Botox. His catheter was just exchanged 2 weeks ago.   07/27/2023: 63 year old man who underwent Botox yesterday presents today with concerns of infection of his cystostomy site. He noticed a thick yellow discharge and some tenderness around his site this morning. He denies fevers and chills. He denies swelling  around the site and redness around the site.   08/11/2023: Patient returns today accompanied by his wife. Reassurance provided at time of last exam. He is now beginning to experience significantly improved urinary urgency and associated incontinence. His catheter is draining quite well without gross hematuria. Not having any suprapubic pain or discomfort. No bleeding or excessive drainage from the insertion site    10/26/2023: 63 year old man with a history of weak urinary stream, feeling of incomplete emptying and meatal stenosis currently managing bladder with suprapubic tube and Botox injections here for renal ultrasound and SP tube change. Since he was last seen he was diagnosed with head and neck cancer. He is currently undergoing chemotherapy. He has 2 more weeks left. He feels like the Botox is starting to wear off.     ALLERGIES: Codeine - Itching    MEDICATIONS: Lisinopril 20 mg tablet  Amitriptyline Hcl 25 mg tablet  Aspirin Ec 81 mg tablet, delayed release  Atorvastatin Calcium 40 mg tablet  Clonidine Hcl 0.2 mg tablet  Duloxetine Hcl 20 mg capsule,delayed release  Fluconazole 200 mg tablet  Furosemide 20 mg tablet  Gabapentin 300 mg capsule  JARDIANCE PO Daily  LANTUS SOLOSTAR Daily  MOUNJARO SQ Weekly  NIFEDIPINE PO Daily  Ondansetron Hcl 8 mg tablet  Other Please supply Foley catheter bags of the patient's choosing that may be worn down near the ankle. He will need 2-4 bags per month. If unable to supply bags with longer tubing, please supply tube extension. please also supply Foley catheter holder 1 to 2/month of the patient's choosing.  Oxycodone Hcl 5 mg capsule  Pantoprazole Sodium 40 mg tablet, delayed release  Pantoprazole Sodium 40 mg tablet, delayed release  Prochlorperazine Maleate 10 mg tablet  Tolterodine Tartrate Er 4 mg capsule, ext release 24 hr  TRAMADOL HCL PO PRN  Xtampza Er 9 mg capsule sprinkle,er 12 hr     GU PSH: Compl Change SP Tube - 06/27/2023 Complex cystometrogram, w/ void pressure and urethral pressure profile studies, any technique - 04/08/2023 Complex Uroflow - 04/08/2023, 03/16/2023 Cystoscopy - 03/16/2023 Emg surf Electrd - 04/08/2023 Inject For cystogram - 04/08/2023 Intrabd voidng Press - 04/08/2023 Simple Change SP Tube - 09/21/2023, 08/24/2023, 06/27/2023 Urethral Meatotomy - 01/10/2023     NON-GU PSH: No Non-GU PSH    GU PMH: Urinary Retention  - 09/21/2023, - 08/24/2023, - 08/11/2023, - 07/11/2023, - 06/27/2023, - 06/14/2023 Incontinence w/o Sensation - 08/11/2023, - 06/27/2023, - 06/14/2023 Urinary Urgency - 08/11/2023, - 06/27/2023 Infection and inflammatory reaction due to cystostomy catheter, initial encounter - 07/27/2023 Incomplete bladder emptying - 06/27/2023, - 05/16/2023, - 05/03/2023, - 04/20/2023, - 04/08/2023, - 03/16/2023, - 02/04/2023 Nocturia - 05/16/2023, - 05/03/2023, - 04/20/2023, - 03/16/2023, - 02/04/2023 Splitting of urinary stream - 05/16/2023, - 03/16/2023, - 02/04/2023 Weak Urinary Stream - 05/16/2023, - 05/03/2023, - 04/20/2023 Kidney Failure Unspec      PMH Notes: acid reflux   NON-GU PMH: Diabetes Type 2 Hypercholesterolemia Hypertension Other specified arthritis, other site Sleep Apnea    FAMILY HISTORY: Diabetes - Father High Blood Pressure - Father   SOCIAL HISTORY: Marital Status: Married Uses smokeless tobacco.    REVIEW OF SYSTEMS:    GU Review Male:   Patient denies frequent urination, hard to postpone urination, burning/ pain with urination, get up at night to urinate, leakage of urine, stream starts and stops, trouble starting your stream, have to strain to urinate , erection problems, and penile pain.  Gastrointestinal (Upper):   Patient denies  nausea, vomiting, and indigestion/ heartburn.  Gastrointestinal (Lower):   Patient denies diarrhea and constipation.  Constitutional:   Patient denies fever, night sweats, weight loss, and fatigue.  Skin:   Patient denies skin rash/ lesion and itching.  Eyes:   Patient denies blurred vision and double vision.  Ears/ Nose/ Throat:   Patient denies sore throat and sinus problems.  Hematologic/Lymphatic:   Patient denies swollen glands and easy bruising.  Cardiovascular:   Patient denies leg swelling and chest pains.  Respiratory:   Patient denies cough and shortness of breath.  Endocrine:   Patient denies excessive thirst.  Musculoskeletal:   Patient denies back pain and joint  pain.  Neurological:   Patient denies headaches and dizziness.  Psychologic:   Patient denies depression and anxiety.   VITAL SIGNS: None   MULTI-SYSTEM PHYSICAL EXAMINATION:    Constitutional: Well-nourished. No physical deformities. Normally developed. Good grooming.  Respiratory: No labored breathing, no use of accessory muscles.   Skin: No paleness, no jaundice, no cyanosis. No lesion, no ulcer, no rash.  Neurologic / Psychiatric: Oriented to time, oriented to place, oriented to person. No depression, no anxiety, no agitation.  Eyes: Normal conjunctivae. Normal eyelids.  Musculoskeletal: Normal gait and station of head and neck.   Notes: swelling of right neck/cheek     Complexity of Data:  Records Review:   Previous Patient Records, POC Tool  Urine Test Review:   Urinalysis  X-Ray Review: Renal Ultrasound: Reviewed Films. Discussed With Patient. renal US without hydronephrosis bilaterally   Notes:                     10/10/2023: BUN 41, creatinine 2.06   PROCEDURES:         Renal Ultrasound - 16109  Right Kidney: Length: 10.70 cm Depth: 6.16 cm Cortical Width: 1.27 cm Width: 7.28 cm  Left Kidney: Length: 11.45 cm Depth: 4.67 cm Cortical Width: 1.24 cm Width: 6.49 cm  Left Kidney/Ureter:  1)? Minimal Fullness-----2) Subcentimeter hyperechoic areas with and without shadowing largest in Mid to Lower Pole measuring 0.48cm and 0.54cm- ? stones vs vascular calcifications   Right Kidney/Ureter:  1) ? Minimal Fullness------2) Subcentimeter hyperechoic areas with and without shadowing - ? stones vs vascular calcifications   Bladder:  Patient has SP tube - Balloon Visualized      Patient confirmed No Neulasta OnPro Device.          Simple Change SP Tube - 51705  The patient's indwelling SP tube was carefully removed. A 16 French Foley catheter was inserted into the bladder using sterile technique. The patient was taught routine catheter care. Hand irrigation of the bladder with  sterile water was performed.   ASSESSMENT:      ICD-10 Details  1 GU:   Incontinence w/o Sensation - N39.42 Chronic, Stable  2   Urinary Retention - R33.8 Chronic, Stable   PLAN:           Schedule Return Visit/Planned Activity: 1 Month - Nurse Visit-No Urine          Document Letter(s):  Created for Patient: Clinical Summary         Notes:   Urinary retention: SP tube change today. Continue monthly SP tube changes with Cindy.   Overactive bladder/leakage: Patient would like to repeat Botox and will increase to 200 units. Will schedule in January which is 4 months from initial date.

## 2024-01-19 NOTE — Op Note (Signed)
 Operative Note  Preoperative diagnosis:  1.  Urge incontinence  Postoperative diagnosis: 1.  Urge incontinence  Procedure(s): 1.  Cystoscopy with Botox injection 200 units 2.  Urethral dilation 3.  Suprapubic tube change  Surgeon: Kasandra Knudsen, MD  Assistants:  None  Anesthesia: MAC  Complications:  None  EBL: None  Specimens: 1.  None  Drains/Catheters: 1.  16 French suprapubic tube   Intraoperative findings:   Normal anterior urethra Normal prostatic urethra Bilateral orthotopic ureteral orifices Normal bladder mucosa  Indication:  Brian Nielsen is a 63 y.o. male with history of acontractile bladder managed with a suprapubic tube also with urge incontinence managed with Botox.  He is here today for repeat Botox injections.  Description of procedure:  After risks and benefits of the procedure discussed with the patient, informed consent was obtained.  The patient taken the operating placed in supine position.  Anesthesia was induced and antibiotics were administered.  He was then repositioned in dorsolithotomy position.  The patient was prepped and draped in the usual sterile fashion and timeout was performed.  The existing suprapubic tube was exchanged without difficulty.  A 16 French catheter was advanced into the bladder and inflated with 10 cc sterile water.  It was then capped.  Next the urethra was dilated with male sounds from 61 [place] to 24 [place].  The injection cystoscope was then placed in the usual meatus and advanced into the bladder under direct visualization.  Findings are noted above.  200 units of Botox mixed in 30 cc of sterile injectable saline were then injected over the posterior wall the bladder in standard fashion.  Care was taken to avoid the ureteral orifices.  The scope was removed and the patient's bladder was decompressed.  The suprapubic tube was hooked back up to his leg bag.  He emerged from anesthesia and transferred the PACU in stable  condition.  Plan: Discharge home

## 2024-01-20 ENCOUNTER — Encounter (HOSPITAL_COMMUNITY): Payer: Self-pay | Admitting: Urology

## 2024-02-03 ENCOUNTER — Inpatient Hospital Stay: Payer: BC Managed Care – PPO | Attending: Internal Medicine

## 2024-02-03 ENCOUNTER — Inpatient Hospital Stay: Payer: BC Managed Care – PPO

## 2024-02-03 ENCOUNTER — Encounter: Payer: Self-pay | Admitting: Internal Medicine

## 2024-02-03 ENCOUNTER — Inpatient Hospital Stay: Payer: BC Managed Care – PPO | Admitting: Internal Medicine

## 2024-02-03 VITALS — BP 125/60 | HR 62 | Temp 98.2°F | Resp 18 | Ht 68.0 in | Wt 183.8 lb

## 2024-02-03 DIAGNOSIS — N139 Obstructive and reflux uropathy, unspecified: Secondary | ICD-10-CM | POA: Insufficient documentation

## 2024-02-03 DIAGNOSIS — E1122 Type 2 diabetes mellitus with diabetic chronic kidney disease: Secondary | ICD-10-CM | POA: Diagnosis present

## 2024-02-03 DIAGNOSIS — N183 Chronic kidney disease, stage 3 unspecified: Secondary | ICD-10-CM | POA: Insufficient documentation

## 2024-02-03 DIAGNOSIS — Z808 Family history of malignant neoplasm of other organs or systems: Secondary | ICD-10-CM | POA: Diagnosis not present

## 2024-02-03 DIAGNOSIS — D649 Anemia, unspecified: Secondary | ICD-10-CM | POA: Diagnosis not present

## 2024-02-03 DIAGNOSIS — I129 Hypertensive chronic kidney disease with stage 1 through stage 4 chronic kidney disease, or unspecified chronic kidney disease: Secondary | ICD-10-CM | POA: Diagnosis present

## 2024-02-03 LAB — BASIC METABOLIC PANEL - CANCER CENTER ONLY
Anion gap: 11 (ref 5–15)
BUN: 29 mg/dL — ABNORMAL HIGH (ref 8–23)
CO2: 22 mmol/L (ref 22–32)
Calcium: 8.4 mg/dL — ABNORMAL LOW (ref 8.9–10.3)
Chloride: 104 mmol/L (ref 98–111)
Creatinine: 1.98 mg/dL — ABNORMAL HIGH (ref 0.61–1.24)
GFR, Estimated: 37 mL/min — ABNORMAL LOW (ref >60)
Glucose, Bld: 154 mg/dL — ABNORMAL HIGH (ref 70–99)
Potassium: 4.1 mmol/L (ref 3.5–5.1)
Sodium: 137 mmol/L (ref 135–145)

## 2024-02-03 LAB — CBC WITH DIFFERENTIAL (CANCER CENTER ONLY)
Abs Immature Granulocytes: 0.01 10*3/uL (ref 0.00–0.07)
Basophils Absolute: 0 10*3/uL (ref 0.0–0.1)
Basophils Relative: 1 %
Eosinophils Absolute: 0.2 10*3/uL (ref 0.0–0.5)
Eosinophils Relative: 6 %
HCT: 26 % — ABNORMAL LOW (ref 39.0–52.0)
Hemoglobin: 8.2 g/dL — ABNORMAL LOW (ref 13.0–17.0)
Immature Granulocytes: 0 %
Lymphocytes Relative: 11 %
Lymphs Abs: 0.4 10*3/uL — ABNORMAL LOW (ref 0.7–4.0)
MCH: 31.1 pg (ref 26.0–34.0)
MCHC: 31.5 g/dL (ref 30.0–36.0)
MCV: 98.5 fL (ref 80.0–100.0)
Monocytes Absolute: 0.5 10*3/uL (ref 0.1–1.0)
Monocytes Relative: 13 %
Neutro Abs: 2.5 10*3/uL (ref 1.7–7.7)
Neutrophils Relative %: 69 %
Platelet Count: 155 10*3/uL (ref 150–400)
RBC: 2.64 MIL/uL — ABNORMAL LOW (ref 4.22–5.81)
RDW: 13 % (ref 11.5–15.5)
WBC Count: 3.6 10*3/uL — ABNORMAL LOW (ref 4.0–10.5)
nRBC: 0 % (ref 0.0–0.2)

## 2024-02-03 NOTE — Progress Notes (Signed)
 [facility] CONSULT NOTE  Patient Care Team: Chyrl Civatte, FNP as PCP - General (Family Medicine) Otelia Sergeant, NP (Inactive) as Nurse Practitioner (Endocrinology) Earna Coder, MD as Consulting Physician (Oncology)  CHIEF COMPLAINTS/PURPOSE OF CONSULTATION: ANEMIA   HEMATOLOGY HISTORY:  # ANEMIA PROGRESSIVE since 2020 12; 2022- 10 MCV- 90s; colonoscopy 2015; multiple endoscopies-last September 2021-biopsy active celiac disease; MAY 2024- Bone marrow biopsy/aspiration- The bone marrow is generally normocellular for age with trilineage  hematopoiesis and nonspecific changes, likely secondary in nature in  this setting.  There is no morphologic evidence of a lymphoproliferative  process or plasma cell neoplasm.   #CKD stage III [[facility]]/diabetes [[facility]]; ? Celaic disease [[facility]];    - Labs: 07/2014 - tTG IgA 101 03/2020 - positive endomysial Ab IgA, tTG IgA 78 - CSY: 07/2014 - normal appearing terminal ileum, sigmoid diverticulosis, and non-bleeding internal hemorrhoids - EGD: 07/2014 - mild non-erosive gastritis, normal appearing duodenal mucosa with duodenal biopsies showing celiac sprue - EGD: 06/03/2020 - procedure aborted due to presence of food - EGD: 08/12/2020 - irregular Z-line found at [facility] with bx showing mild reflux esophagitis, 1 cm hiatal hernia, mild chronic gastritis, decreased folds found in entire duodenum, flattening found in entire duodenum and scalloped mucosa with bx showing active Celiac disease with villous atrophy, increased intraepithelial lymphocytes, etc. Repeat in 1-year.  HISTORY OF PRESENTING ILLNESS:  with wife.  Ambulating independently.  Brian Nielsen 63 y.o.  male symptomatic anemia-likely secondary to chronic renal disease-/chronic urinary obstruction- s/p SPC -IDA / poorly controlled diabetes-complications of CKD; peripheral neuropathy; and also newly diagnosed p16 positive tonsil cancer currently s/p   definitive chemoradiation at [facility] is here for follow-up.  Patient appetite improving.  Overall feels better continues to get chemoradiation.  Is gaining weight.  Denies any swelling in the neck.  Chronic mild fatigue.  He denies any worsening swelling in the legs .  Review of Systems  Constitutional:  Positive for malaise/fatigue. Negative for chills, diaphoresis and fever.  HENT:  Negative for nosebleeds and sore throat.   Eyes:  Negative for double vision.  Respiratory:  Negative for cough, hemoptysis, sputum production and wheezing.   Cardiovascular:  Negative for chest pain, palpitations, orthopnea and leg swelling.  Gastrointestinal:  Negative for blood in stool, heartburn, melena, nausea and vomiting.  Genitourinary:  Negative for dysuria, frequency and urgency.  Musculoskeletal:  Positive for back pain and joint pain.  Skin: Negative.  Negative for itching and rash.  Neurological:  Positive for weakness. Negative for dizziness, tingling, focal weakness and headaches.  Endo/Heme/Allergies:  Does not bruise/bleed easily.  Psychiatric/Behavioral:  Negative for depression. The patient is not nervous/anxious and does not have insomnia.     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   Charcot foot due to diabetes mellitus (HCC)    Chronic constipation    CKD (chronic kidney disease), stage III [facility])    nephrologist--- dr Wolfgang Phoenix;  renal bx 11-20-2021   Diverticulosis of colon    Edema of both lower extremities    GERD (gastroesophageal reflux disease)  History of adenomatous polyp of colon    HTN (hypertension)     Hyperlipidemia, mixed    Peripheral neuropathy    Peyronie's disease    Pneumonia    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 meatal stricture    Retinopathy due to secondary diabetes mellitus (HCC)    both eyes  treated with injecitons   Thoracic spondylosis without myelopathy 11/22/2016   Type 2 diabetes mellitus [facility])    endocrinologist--- dr nida   Vitamin D deficiency    Wears hearing aid in both ears     SURGICAL HISTORY: Past Surgical History:  Procedure Laterality Date   BIOPSY N/A 09/07/2021   Procedure: BIOPSY;  Surgeon: Midge Minium, MD;  Location: [facility];  Service: Endoscopy;  Laterality: N/A;   BOTOX INJECTION N/A 01/19/2024   Procedure: BOTOX INJECTION;  Surgeon: Noel Christmas, MD;  Location: [facility];  Service: Urology;  Laterality: N/A;   CATARACT EXTRACTION W/ INTRAOCULAR LENS IMPLANT Bilateral 2023   COLONOSCOPY N/A 05/03/2014   Procedure: COLONOSCOPY;  Surgeon: West Bali, MD;  Location[facility];  Service: Endoscopy;  Laterality: N/A;  12:00   COLONOSCOPY WITH PROPOFOL N/A 09/07/2021   Procedure: COLONOSCOPY WITH PROPOFOL;  Surgeon: Midge Minium, MD;  Location: [facility];  Service: Endoscopy;  Laterality: N/A;   CYSTOSCOPY N/A 01/19/2024   Procedure: CYSTOSCOPY,SUPRAPUBIC TUBE EXCHANGE;  Surgeon: Noel Christmas, MD;  Location: [facility];  Service: Urology;  Laterality: N/A;  30 MINUTE CASE   CYSTOSCOPY WITH INJECTION N/A 07/26/2023   Procedure: CYSTOSCOPY WITH BOTOX INJECTION 100 UNITS;  Surgeon: Noel Christmas, MD;  Location: [facility];  Service: Urology;  Laterality: N/A;  30 minutes   CYSTOSCOPY WITH URETHRAL DILATATION N/A 01/10/2023   Procedure: CYSTOSCOPY;  Surgeon: Marcine Matar, MD;  Location: [facility];  Service: Urology;  Laterality: N/A;   ESOPHAGOGASTRODUODENOSCOPY N/A 05/03/2014   Procedure: ESOPHAGOGASTRODUODENOSCOPY (EGD);  Surgeon: West Bali, MD;  [facility];  Service: Endoscopy;  Laterality:  N/A;   ESOPHAGOGASTRODUODENOSCOPY (EGD) WITH PROPOFOL N/A 06/03/2020   Procedure: ESOPHAGOGASTRODUODENOSCOPY (EGD) WITH PROPOFOL;  Surgeon: Regis Bill, MD;  Location: [facility] END[facility];  Service: Endoscopy;  Laterality: N/A;   ESOPHAGOGASTRODUODENOSCOPY (EGD) WITH PROPOFOL N/A 09/07/2021   Procedure: ESOPHAGOGASTRODUODENOSCOPY (EGD) WITH PROPOFOL;  Surgeon: Midge Minium, MD;  Location: [facility];  Service: Endoscopy;  Laterality: N/A;  Diabetic   IR BONE MARROW BIOPSY & ASPIRATION  04/27/2023   MEATOTOMY N/A 01/10/2023   Procedure: MEATOTOMY ADULT;  Surgeon: Marcine Matar, MD;  Location: [facility];  Service: Urology;  Laterality: N/A;   PARS PLANA VITRECTOMY Right 03/21/2021   Procedure: PARS PLANA VITRECTOMY 25 GAUGE WITH INJECTION OF ANTIBIOTICS FOR ENDOPHTHALMITIS;  Surgeon: Carmela Rima, MD;  Location: [facility] OR;  Service: Ophthalmology;  Laterality: Right;   POLYPECTOMY N/A 09/07/2021   Procedure: POLYPECTOMY;  Surgeon: Midge Minium, MD;  Location: [facility];  Service: Endoscopy;  Laterality: N/A;   REPAIR EXTENSOR TENDON Right 08/11/2022   Procedure: EXTENSOR CARPI ULNARIS TENDON DEBRIDEMENT, POSSIBLE SUBSHEATH RECONSTRUCTION;  Surgeon: Marlyne Beards, MD;  Location: [facility];  Service: Orthopedics;  Laterality: Right;  or regional plus MAC    SOCIAL HISTORY: Social History   Socioeconomic History   Marital status: Married    Spouse name: [name]   Number of children: 1   Years of education: 12   Highest education level: Not on file  Occupational History   Occupation: Personnel officer  Employer: [employer]  Tobacco Use   Smoking status: Never   Smokeless tobacco: Former    Types: Snuff  Vaping Use   Vaping status: Never Used  Substance and Sexual Activity   Alcohol use: Not Currently    Comment: seldom   Drug use: Never   Sexual activity: Yes    Birth control/protection: Surgical  Other Topics Concern    Not on file  Social History Narrative   Lives with wife   [place]   Caffeine- diet Mtn Dew, 2 glasses   Works as Scientist, water quality for [employer]   Social Drivers of Health   Financial Resource Strain: Low Risk  (11/23/2023)   Received from [facility]   Overall Financial Resource Strain (CARDIA)    Difficulty of Paying Living Expenses: Not hard at all  Food Insecurity: No Food Insecurity (11/23/2023)   Received from [facility]   Hunger Vital Sign    Worried About Running Out of Food in the Last Year: Never true    Ran Out of Food in the Last Year: Never true  Transportation Needs: No Transportation Needs (11/23/2023)   Received from [facility] - Transportation    Lack of Transportation (Medical): No    Lack of Transportation (Non-Medical): No  Physical Activity: Patient Declined (02/08/2023)   Received from [facility], [facility]   Exercise Vital Sign    Days of Exercise per Week: Patient declined    Minutes of Exercise per Session: Patient declined  Stress: No Stress Concern Present (02/08/2023)   Received from [facility], [facility] - Occupational Stress Questionnaire    Feeling of Stress : Not at all  Social Connections: Socially Integrated (02/08/2023)   Received from [facility], [facility]   Social Network    How would you rate your social network (family, work, friends)?: Good participation with social networks  Intimate Partner Violence: Not At Risk (11/06/2023)   Humiliation, Afraid, Rape, and Kick questionnaire    Fear of Current or Ex-Partner: No    Emotionally Abused: No    Physically Abused: No    Sexually Abused: No    FAMILY HISTORY: Family History  Problem Relation Age of Onset   Aneurysm Mother        brain   Hypertension Mother    Cancer Father    Heart disease Father    Diabetes Father    Hypertension Father    Hyperlipidemia Father     Cancer Maternal Grandmother        melanoma   Heart disease Paternal Grandfather    Colon cancer Neg Hx     ALLERGIES:  is allergic to codeine.  MEDICATIONS:  Current Outpatient Medications  Medication Sig Dispense Refill   aspirin 81 MG EC tablet Take 81 mg by mouth daily.     atorvastatin (LIPITOR) 40 MG tablet Take 40 mg by mouth at bedtime.     cloNIDine (CATAPRES) 0.2 MG tablet Take 1 tablet (0.2 mg total) by mouth 3 (three) times daily. (Patient taking differently: Take 0.2 mg by mouth 2 (two) times daily.) 90 tablet 1   empagliflozin (JARDIANCE) 10 MG TABS tablet Take 10 mg by mouth daily.     feeding supplement (ENSURE ENLIVE / ENSURE PLUS) LIQD Take 237 mLs by mouth 3 (three) times daily between meals. (Patient taking differently: Take 237 mLs by mouth daily.)  furosemide (LASIX) 20 MG tablet Take 20 mg by mouth daily as needed for fluid or edema. prn     gabapentin (NEURONTIN) 300 MG capsule Take 1,500 capsules by mouth in the morning and at bedtime.     insulin glargine (LANTUS SOLOSTAR) 100 UNIT/ML Solostar Pen Inject 20 Units into the skin 2 (two) times daily. (Patient taking differently: Inject 35 Units into the skin daily as needed (High bg).)     lisinopril (ZESTRIL) 20 MG tablet Take 20 mg by mouth daily.     pantoprazole (PROTONIX) 40 MG tablet Take 40 mg by mouth daily.     polyethylene glycol (MIRALAX / GLYCOLAX) 17 g packet Take 17 g by mouth daily as needed for mild constipation or moderate constipation (1 capful).     tirzepatide (MOUNJARO) 15 MG/0.5ML Pen Inject 15 mg into the skin once a week. 2 mL 3   tolterodine (DETROL LA) 4 MG 24 hr capsule Take 4 mg by mouth daily.     No current facility-administered medications for this visit.      PHYSICAL EXAMINATION:  S/p Suprapubic catheter. 2cm LN in right neck; Right enlarged tonsil.  Vitals:   02/03/24 1434  BP: 125/60  Pulse: 62  Resp: 18  Temp: 98.2 F (36.8 C)  SpO2: 100%     Filed Weights    02/03/24 1434  Weight: 183 lb 12.8 oz (83.4 kg)      Physical Exam Vitals and nursing note reviewed.  Constitutional:      Comments:    HENT:     Head: Normocephalic and atraumatic.     Mouth/Throat:     Pharynx: Oropharynx is clear.  Eyes:     Extraocular Movements: Extraocular movements intact.     Pupils: Pupils are equal, round, and reactive to light.  Cardiovascular:     Rate and Rhythm: Normal rate and regular rhythm.  Pulmonary:     Comments: Decreased breath sounds bilaterally.  Abdominal:     Palpations: Abdomen is soft.  Musculoskeletal:        General: Normal range of motion.     Cervical back: Normal range of motion.  Skin:    General: Skin is warm.  Neurological:     General: No focal deficit present.     Mental Status: He is alert and oriented to person, place, and time.  Psychiatric:        Behavior: Behavior normal.        Judgment: Judgment normal.     LABORATORY DATA:  I have reviewed the data as listed Lab Results  Component Value Date   WBC 3.6 (L) 02/03/2024   HGB 8.2 (L) 02/03/2024   HCT 26.0 (L) 02/03/2024   MCV 98.5 02/03/2024   PLT 155 02/03/2024   Recent Labs    11/07/23 0427 11/08/23 0354 11/08/23 0355 11/09/23 0427 11/12/23 0449 11/13/23 0430 12/12/23 0920 01/11/24 1444 02/03/24 1421  NA 135  --  138   < > 149*   < > 139 142 137  K 4.7  --  3.7   < > 3.2*   < > 5.0 4.2 4.1  CL 107  --  109   < > 124*   < > 111 110 104  CO2 20*  --  18*   < > 20*   < > 21* 22 22  GLUCOSE 51*  --  87   < > 235*   < > 301* 200* 154*  BUN 74*  --  64*   < > 56*   < > 37* 32* 29*  CREATININE 4.41*  --  3.36*   < > 1.98*   < > 1.86* 1.77* 1.98*  CALCIUM 8.0*  --  8.3*   < > 8.2*   < > 8.5* 9.0 8.4*  GFRNONAA 14*  --  20*   < > 37*   < > 40* 43* 37*  PROT 4.9* 5.2*  --   --  5.6*  --   --   --   --   ALBUMIN 1.9* 1.9* 1.9*  --  2.0*  --   --   --   --   AST 114* 65*  --   --  13*  --   --   --   --   ALT 139* 116*  --   --  40  --   --   --    --   ALKPHOS 63 77  --   --  82  --   --   --   --   BILITOT 0.5 0.3  --   --  0.7  --   --   --   --   BILIDIR  --  0.1  --   --   --   --   --   --   --   IBILI  --  0.2*  --   --   --   --   --   --   --    < > = values in this interval not displayed.     No results found.  Symptomatic anemia #Anemia-symptomatic fatigue hemoglobin 9-10--likely multifactorial-iron malabsorption/celiac disease/CKD-stage MAY 2024- Bone marrow biopsy/aspiration- The bone marrow is generally normocellular for age with trilineage  hematopoiesis and nonspecific changes, likely secondary in nature in  this setting.  There is no morphologic evidence of a lymphoproliferative  process or plasma cell neoplasm.   # Anemia: SEP 2024-iron saturation- 19  ferritin700;  HOLD further retacrit  as patient is undergoing active chemotherapy for curable cancer.  HOLD venofer today  # 09/07/2023: Stage II (cT3, cN1, cM0, p16+)  -currently s/p definitive chemoradiation with CarboTaxol weekly [[facility]] -clinical response noted.  Awaiting follow up PET scan in March 2025. Defer to [facility] for further management.  # chronic urinary obstruction- s/p SPC-Ckd stage III [nephro, Dr.Bhutani [place]]-diabetes- GFR- 40s-  stable  # Diabetes [[facility] Hb A1c- 7.4]- continue follow-up with endocrinology/monitoring of blood sugars. stable  # DISPOSITION: # NO Venofer today; NO retacrit # follow up in 2 months-; MD;  labs- cbc/bmp;iron studies;ferritin- ;possible venofer or retacrit-  -Dr.B  All questions were answered. The patient knows to call the clinic with any problems, questions or concerns.    Earna Coder, MD 02/09/2024 10:47 PM

## 2024-02-03 NOTE — Assessment & Plan Note (Addendum)
#  Anemia-symptomatic fatigue hemoglobin 9-10--likely multifactorial-iron malabsorption/celiac disease/CKD-stage MAY 2024- Bone marrow biopsy/aspiration- The bone marrow is generally normocellular for age with trilineage  hematopoiesis and nonspecific changes, likely secondary in nature in  this setting.  There is no morphologic evidence of a lymphoproliferative  process or plasma cell neoplasm.   # Anemia: SEP 2024-iron saturation- 19  ferritin700;  HOLD further retacrit  as patient is undergoing active chemotherapy for curable cancer.  HOLD venofer today  # 09/07/2023: Stage II (cT3, cN1, cM0, p16+)  -currently s/p definitive chemoradiation with CarboTaxol weekly [[facility]] -clinical response noted.  Awaiting follow up PET scan in March 2025. Defer to [facility] for further management.  # chronic urinary obstruction- s/p SPC-Ckd stage III [nephro, Dr.Bhutani [place]]-diabetes- GFR- 40s-  stable  # Diabetes [[facility] Hb A1c- 7.4]- continue follow-up with endocrinology/monitoring of blood sugars. stable  # DISPOSITION: # NO Venofer today; NO retacrit # follow up in 2 months-; MD;  labs- cbc/bmp;iron studies;ferritin- ;possible venofer or retacrit-  -Dr.B

## 2024-02-03 NOTE — Progress Notes (Signed)
 Fatigue/weakness: NO  Dyspena: NO  Light headedness: NO  Blood in stool: NO  Had botox injection to bladder 2 weeks ago, [facility].

## 2024-02-09 ENCOUNTER — Encounter: Payer: Self-pay | Admitting: Internal Medicine

## 2024-02-15 ENCOUNTER — Ambulatory Visit: Payer: Self-pay | Admitting: Internal Medicine

## 2024-02-21 ENCOUNTER — Encounter: Payer: Self-pay | Admitting: Internal Medicine

## 2024-02-21 ENCOUNTER — Other Ambulatory Visit (HOSPITAL_COMMUNITY): Payer: Self-pay

## 2024-02-21 MED ORDER — MOUNJARO 15 MG/0.5ML ~~LOC~~ SOAJ
15.0000 mg | SUBCUTANEOUS | 1 refills | Status: AC
  Filled 2024-02-21: qty 6, 84d supply, fill #0
  Filled 2024-05-12: qty 4, 56d supply, fill #1
  Filled 2024-05-15 – 2024-05-21 (×2): qty 2, 28d supply, fill #1
  Filled 2024-06-15: qty 2, 28d supply, fill #2
  Filled 2024-07-19: qty 2, 28d supply, fill #3

## 2024-02-23 ENCOUNTER — Other Ambulatory Visit (HOSPITAL_COMMUNITY): Payer: Self-pay

## 2024-03-27 ENCOUNTER — Encounter: Payer: Self-pay | Admitting: Internal Medicine

## 2024-03-30 DIAGNOSIS — G5622 Lesion of ulnar nerve, left upper limb: Secondary | ICD-10-CM | POA: Insufficient documentation

## 2024-03-30 DIAGNOSIS — G5602 Carpal tunnel syndrome, left upper limb: Secondary | ICD-10-CM | POA: Insufficient documentation

## 2024-04-12 ENCOUNTER — Inpatient Hospital Stay

## 2024-04-12 ENCOUNTER — Encounter: Payer: Self-pay | Admitting: Internal Medicine

## 2024-04-12 ENCOUNTER — Inpatient Hospital Stay (HOSPITAL_BASED_OUTPATIENT_CLINIC_OR_DEPARTMENT_OTHER): Admitting: Internal Medicine

## 2024-04-12 ENCOUNTER — Inpatient Hospital Stay: Attending: Internal Medicine

## 2024-04-12 DIAGNOSIS — D649 Anemia, unspecified: Secondary | ICD-10-CM

## 2024-04-12 DIAGNOSIS — Z808 Family history of malignant neoplasm of other organs or systems: Secondary | ICD-10-CM | POA: Insufficient documentation

## 2024-04-12 DIAGNOSIS — G629 Polyneuropathy, unspecified: Secondary | ICD-10-CM | POA: Insufficient documentation

## 2024-04-12 DIAGNOSIS — N139 Obstructive and reflux uropathy, unspecified: Secondary | ICD-10-CM | POA: Diagnosis not present

## 2024-04-12 DIAGNOSIS — Z809 Family history of malignant neoplasm, unspecified: Secondary | ICD-10-CM | POA: Diagnosis not present

## 2024-04-12 DIAGNOSIS — E119 Type 2 diabetes mellitus without complications: Secondary | ICD-10-CM | POA: Diagnosis not present

## 2024-04-12 DIAGNOSIS — K900 Celiac disease: Secondary | ICD-10-CM | POA: Diagnosis not present

## 2024-04-12 DIAGNOSIS — N183 Chronic kidney disease, stage 3 unspecified: Secondary | ICD-10-CM | POA: Insufficient documentation

## 2024-04-12 LAB — IRON AND TIBC
Iron: 70 ug/dL (ref 45–182)
Saturation Ratios: 28 % (ref 17.9–39.5)
TIBC: 253 ug/dL (ref 250–450)
UIBC: 183 ug/dL

## 2024-04-12 LAB — CBC (CANCER CENTER ONLY)
HCT: 29.2 % — ABNORMAL LOW (ref 39.0–52.0)
Hemoglobin: 9.6 g/dL — ABNORMAL LOW (ref 13.0–17.0)
MCH: 30.5 pg (ref 26.0–34.0)
MCHC: 32.9 g/dL (ref 30.0–36.0)
MCV: 92.7 fL (ref 80.0–100.0)
Platelet Count: 125 10*3/uL — ABNORMAL LOW (ref 150–400)
RBC: 3.15 MIL/uL — ABNORMAL LOW (ref 4.22–5.81)
RDW: 13.5 % (ref 11.5–15.5)
WBC Count: 3.8 10*3/uL — ABNORMAL LOW (ref 4.0–10.5)
nRBC: 0 % (ref 0.0–0.2)

## 2024-04-12 LAB — BASIC METABOLIC PANEL - CANCER CENTER ONLY
Anion gap: 8 (ref 5–15)
BUN: 22 mg/dL (ref 8–23)
CO2: 19 mmol/L — ABNORMAL LOW (ref 22–32)
Calcium: 8.5 mg/dL — ABNORMAL LOW (ref 8.9–10.3)
Chloride: 112 mmol/L — ABNORMAL HIGH (ref 98–111)
Creatinine: 1.72 mg/dL — ABNORMAL HIGH (ref 0.61–1.24)
GFR, Estimated: 44 mL/min — ABNORMAL LOW (ref >60)
Glucose, Bld: 144 mg/dL — ABNORMAL HIGH (ref 70–99)
Potassium: 4.1 mmol/L (ref 3.5–5.1)
Sodium: 139 mmol/L (ref 135–145)

## 2024-04-12 LAB — FERRITIN: Ferritin: 397 ng/mL — ABNORMAL HIGH (ref 24–336)

## 2024-04-12 NOTE — Progress Notes (Signed)
 [facility] CONSULT NOTE  Patient Care Team: Gwenevere Lent, FNP as PCP - General (Family Medicine) Angelia Barcelona, NP (Inactive) as Nurse Practitioner (Endocrinology) Gwyn Leos, MD as Consulting Physician (Oncology)  CHIEF COMPLAINTS/PURPOSE OF CONSULTATION: ANEMIA   HEMATOLOGY HISTORY:  # ANEMIA PROGRESSIVE since 2020 12; 2022- 10 MCV- 90s; colonoscopy 2015; multiple endoscopies-last September 2021-biopsy active celiac disease; MAY 2024- Bone marrow biopsy/aspiration- The bone marrow is generally normocellular for age with trilineage  hematopoiesis and nonspecific changes, likely secondary in nature in  this setting.  There is no morphologic evidence of a lymphoproliferative  process or plasma cell neoplasm.   #CKD stage III [[facility]]/diabetes [[facility]]; ? Celaic disease [[facility]];    - Labs: 07/2014 - tTG IgA 101 03/2020 - positive endomysial Ab IgA, tTG IgA 78 - CSY: 07/2014 - normal appearing terminal ileum, sigmoid diverticulosis, and non-bleeding internal hemorrhoids - EGD: 07/2014 - mild non-erosive gastritis, normal appearing duodenal mucosa with duodenal biopsies showing celiac sprue - EGD: 06/03/2020 - procedure aborted due to presence of food - EGD: 08/12/2020 - irregular Z-line found at [facility] with bx showing mild reflux esophagitis, 1 cm hiatal hernia, mild chronic gastritis, decreased folds found in entire duodenum, flattening found in entire duodenum and scalloped mucosa with bx showing active Celiac disease with villous atrophy, increased intraepithelial lymphocytes, etc. Repeat in 1-year.  HISTORY OF PRESENTING ILLNESS:  with wife.  Ambulating independently.  Brian Nielsen 63 y.o.  male symptomatic anemia-likely secondary to chronic renal disease-/chronic urinary obstruction- s/p SPC -IDA / poorly controlled diabetes-complications of CKD; peripheral neuropathy; and also newly diagnosed p16 positive tonsil cancer currently s/p   definitive chemoradiation at [facility] is here for follow-up.  Patient appetite improving.  Is gaining weight.  Denies any swelling in the neck.  Chronic mild fatigue.  He denies any worsening swelling in the legs .  Review of Systems  Constitutional:  Positive for malaise/fatigue. Negative for chills, diaphoresis and fever.  HENT:  Negative for nosebleeds and sore throat.   Eyes:  Negative for double vision.  Respiratory:  Negative for cough, hemoptysis, sputum production and wheezing.   Cardiovascular:  Negative for chest pain, palpitations, orthopnea and leg swelling.  Gastrointestinal:  Negative for blood in stool, heartburn, melena, nausea and vomiting.  Genitourinary:  Negative for dysuria, frequency and urgency.  Musculoskeletal:  Positive for back pain and joint pain.  Skin: Negative.  Negative for itching and rash.  Neurological:  Positive for weakness. Negative for dizziness, tingling, focal weakness and headaches.  Endo/Heme/Allergies:  Does not bruise/bleed easily.  Psychiatric/Behavioral:  Negative for depression. The patient is not nervous/anxious and does not have insomnia.     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   Charcot foot due to diabetes mellitus (HCC)    Chronic constipation    CKD (chronic kidney disease), stage III [facility])    nephrologist--- dr Carrolyn Clan;  renal bx 11-20-2021   Diverticulosis of colon    Edema of both lower extremities    GERD (gastroesophageal reflux disease)    History of adenomatous polyp of colon  HTN (hypertension)    Hyperlipidemia, mixed    Peripheral neuropathy     Peyronie's disease    Pneumonia    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 meatal stricture    Retinopathy due to secondary diabetes mellitus (HCC)    both eyes  treated with injecitons   Thoracic spondylosis without myelopathy 11/22/2016   Type 2 diabetes mellitus [facility])    endocrinologist--- dr nida   Vitamin D  deficiency    Wears hearing aid in both ears     SURGICAL HISTORY: Past Surgical History:  Procedure Laterality Date   BIOPSY N/A 09/07/2021   Procedure: BIOPSY;  Surgeon: Marnee Sink, MD;  Location: [facility];  Service: Endoscopy;  Laterality: N/A;   BOTOX  INJECTION N/A 01/19/2024   Procedure: BOTOX  INJECTION;  Surgeon: Roxane Copp, MD;  Location: [facility];  Service: Urology;  Laterality: N/A;   CATARACT EXTRACTION W/ INTRAOCULAR LENS IMPLANT Bilateral 2023   COLONOSCOPY N/A 05/03/2014   Procedure: COLONOSCOPY;  Surgeon: Alyce Jubilee, MD;  [facility];  Service: Endoscopy;  Laterality: N/A;  12:00   COLONOSCOPY WITH PROPOFOL  N/A 09/07/2021   Procedure: COLONOSCOPY WITH PROPOFOL ;  Surgeon: Marnee Sink, MD;  Location: [facility];  Service: Endoscopy;  Laterality: N/A;   CYSTOSCOPY N/A 01/19/2024   Procedure: CYSTOSCOPY,SUPRAPUBIC TUBE EXCHANGE;  Surgeon: Roxane Copp, MD;  Location: [facility];  Service: Urology;  Laterality: N/A;  30 MINUTE CASE   CYSTOSCOPY WITH INJECTION N/A 07/26/2023   Procedure: CYSTOSCOPY WITH BOTOX  INJECTION 100 UNITS;  Surgeon: Roxane Copp, MD;  Location: [facility];  Service: Urology;  Laterality: N/A;  30 minutes   CYSTOSCOPY WITH URETHRAL DILATATION N/A 01/10/2023   Procedure: CYSTOSCOPY;  Surgeon: Trent Frizzle, MD;  Location: [facility];  Service: Urology;  Laterality: N/A;   ESOPHAGOGASTRODUODENOSCOPY N/A 05/03/2014   Procedure: ESOPHAGOGASTRODUODENOSCOPY (EGD);  Surgeon: Alyce Jubilee, MD;  [facility];  Service: Endoscopy;  Laterality: N/A;   ESOPHAGOGASTRODUODENOSCOPY (EGD) WITH  PROPOFOL  N/A 06/03/2020   Procedure: ESOPHAGOGASTRODUODENOSCOPY (EGD) WITH PROPOFOL ;  Surgeon: Shane Darling, MD;  Location: [facility];  Service: Endoscopy;  Laterality: N/A;   ESOPHAGOGASTRODUODENOSCOPY (EGD) WITH PROPOFOL  N/A 09/07/2021   Procedure: ESOPHAGOGASTRODUODENOSCOPY (EGD) WITH PROPOFOL ;  Surgeon: Marnee Sink, MD;  Location: [facility];  Service: Endoscopy;  Laterality: N/A;  Diabetic   IR BONE MARROW BIOPSY & ASPIRATION  04/27/2023   MEATOTOMY N/A 01/10/2023   Procedure: MEATOTOMY ADULT;  Surgeon: Trent Frizzle, MD;  Location: [facility];  Service: Urology;  Laterality: N/A;   PARS PLANA VITRECTOMY Right 03/21/2021   Procedure: PARS PLANA VITRECTOMY 25 GAUGE WITH INJECTION OF ANTIBIOTICS FOR ENDOPHTHALMITIS;  Surgeon: Jearline Minder, MD;  Location: [facility] OR;  Service: Ophthalmology;  Laterality: Right;   POLYPECTOMY N/A 09/07/2021   Procedure: POLYPECTOMY;  Surgeon: Marnee Sink, MD;  Location: [facility];  Service: Endoscopy;  Laterality: N/A;   REPAIR EXTENSOR TENDON Right 08/11/2022   Procedure: EXTENSOR CARPI ULNARIS TENDON DEBRIDEMENT, POSSIBLE SUBSHEATH RECONSTRUCTION;  Surgeon: Marilyn Shropshire, MD;  Location: [facility];  Service: Orthopedics;  Laterality: Right;  or regional plus MAC    SOCIAL HISTORY: Social History   Socioeconomic History   Marital status: Married    Spouse name: [name]   Number of children: 1   Years of education: 12   Highest education level: Not on file  Occupational History   Occupation: Architect: [employer]  Tobacco Use  Smoking status: Never   Smokeless tobacco: Former    Types: Snuff  Vaping Use   Vaping status: Never Used  Substance and Sexual Activity   Alcohol use: Not Currently    Comment: seldom   Drug use: Never   Sexual activity: Yes    Birth control/protection: Surgical  Other Topics Concern   Not on file  Social History Narrative    Lives with wife   [place]   Caffeine- diet Mtn Dew, 2 glasses   Works as Scientist, water quality for [employer]   Social Drivers of Health   Financial Resource Strain: Low Risk  (11/23/2023)   Received from [facility]   Overall Financial Resource Strain (CARDIA)    Difficulty of Paying Living Expenses: Not hard at all  Food Insecurity: No Food Insecurity (11/23/2023)   Received from [facility]   Hunger Vital Sign    Worried About Running Out of Food in the Last Year: Never true    Ran Out of Food in the Last Year: Never true  Transportation Needs: No Transportation Needs (11/23/2023)   Received from [facility] - Transportation    Lack of Transportation (Medical): No    Lack of Transportation (Non-Medical): No  Physical Activity: Patient Declined (02/08/2023)   Received from [facility]   Exercise Vital Sign    Days of Exercise per Week: Patient declined    Minutes of Exercise per Session: Patient declined  Stress: No Stress Concern Present (02/08/2023)   Received from [facility], [facility] - Occupational Stress Questionnaire    Feeling of Stress : Not at all  Social Connections: Socially Integrated (02/08/2023)   Received from [facility], [facility]   Social Network    How would you rate your social network (family, work, friends)?: Good participation with social networks  Intimate Partner Violence: Not At Risk (11/06/2023)   Humiliation, Afraid, Rape, and Kick questionnaire    Fear of Current or Ex-Partner: No    Emotionally Abused: No    Physically Abused: No    Sexually Abused: No    FAMILY HISTORY: Family History  Problem Relation Age of Onset   Aneurysm Mother        brain   Hypertension Mother    Cancer Father    Heart disease Father    Diabetes Father    Hypertension Father    Hyperlipidemia Father    Cancer Maternal Grandmother        melanoma    Heart disease Paternal Grandfather    Colon cancer Neg Hx     ALLERGIES:  is allergic to codeine.  MEDICATIONS:  Current Outpatient Medications  Medication Sig Dispense Refill   hydrALAZINE  (APRESOLINE ) 10 MG tablet Take 10 mg by mouth.     aspirin  81 MG EC tablet Take 81 mg by mouth daily.     atorvastatin  (LIPITOR) 40 MG tablet Take 40 mg by mouth at bedtime.     cloNIDine  (CATAPRES ) 0.2 MG tablet Take 1 tablet (0.2 mg total) by mouth 3 (three) times daily. (Patient taking differently: Take 0.2 mg by mouth 2 (two) times daily.) 90 tablet 1   empagliflozin (JARDIANCE) 10 MG TABS tablet Take 10 mg by mouth daily.     feeding supplement (ENSURE ENLIVE / ENSURE PLUS) LIQD Take 237 mLs by mouth 3 (three) times daily between meals. (Patient taking differently: Take 237 mLs by mouth  daily.)     furosemide (LASIX) 20 MG tablet Take 20 mg by mouth daily as needed for fluid or edema. prn     gabapentin (NEURONTIN) 300 MG capsule Take 1,500 capsules by mouth in the morning and at bedtime.     insulin  glargine (LANTUS  SOLOSTAR) 100 UNIT/ML Solostar Pen Inject 20 Units into the skin 2 (two) times daily. (Patient taking differently: Inject 35 Units into the skin daily as needed (High bg).)     lisinopril  (ZESTRIL ) 20 MG tablet Take 20 mg by mouth daily.     metoprolol  tartrate (LOPRESSOR ) 25 MG tablet Take 0.5 tablets by mouth 2 (two) times daily.     mirabegron ER (MYRBETRIQ) 50 MG TB24 tablet Take 1 tablet by mouth daily.     pantoprazole  (PROTONIX ) 40 MG tablet Take 40 mg by mouth daily.     polyethylene glycol (MIRALAX  / GLYCOLAX ) 17 g packet Take 17 g by mouth daily as needed for mild constipation or moderate constipation (1 capful).     sodium bicarbonate  650 MG tablet Take 1 tablet (650 mg total) by mouth 2 (two) times daily.     tirzepatide  (MOUNJARO ) 15 MG/0.5ML Pen Inject 15 mg into the skin once a week. 6 mL 1   tolterodine (DETROL LA) 4 MG 24 hr capsule Take 4 mg by mouth daily.      topiramate  (TOPAMAX ) 100 MG tablet Take one tablet (100 mg dose) by mouth 2 (two) times daily.     traMADol  (ULTRAM ) 50 MG tablet Take 1 tablet by mouth every 6 (six) hours as needed.     No current facility-administered medications for this visit.      PHYSICAL EXAMINATION:  S/p Suprapubic catheter. 2cm LN in right neck; Right enlarged tonsil.  Vitals:   04/12/24 1026  BP: (!) 176/76  Pulse: 61  Resp: 19  Temp: (!) 96 F (35.6 C)  SpO2: 100%     Filed Weights   04/12/24 1026  Weight: 177 lb 11.2 oz (80.6 kg)      Physical Exam Vitals and nursing note reviewed.  Constitutional:      Comments:    HENT:     Head: Normocephalic and atraumatic.     Mouth/Throat:     Pharynx: Oropharynx is clear.  Eyes:     Extraocular Movements: Extraocular movements intact.     Pupils: Pupils are equal, round, and reactive to light.  Cardiovascular:     Rate and Rhythm: Normal rate and regular rhythm.  Pulmonary:     Comments: Decreased breath sounds bilaterally.  Abdominal:     Palpations: Abdomen is soft.  Musculoskeletal:        General: Normal range of motion.     Cervical back: Normal range of motion.  Skin:    General: Skin is warm.  Neurological:     General: No focal deficit present.     Mental Status: He is alert and oriented to person, place, and time.  Psychiatric:        Behavior: Behavior normal.        Judgment: Judgment normal.     LABORATORY DATA:  I have reviewed the data as listed Lab Results  Component Value Date   WBC 3.8 (L) 04/12/2024   HGB 9.6 (L) 04/12/2024   HCT 29.2 (L) 04/12/2024   MCV 92.7 04/12/2024   PLT 125 (L) 04/12/2024   Recent Labs    11/07/23 0427 11/08/23 0354 11/08/23 0355 11/09/23 0427 11/12/23 0449 11/13/23  0430 01/11/24 1444 02/03/24 1421 04/12/24 1014  NA 135  --  138   < > 149*   < > 142 137 139  K 4.7  --  3.7   < > 3.2*   < > 4.2 4.1 4.1  CL 107  --  109   < > 124*   < > 110 104 112*  CO2 20*  --  18*   < >  20*   < > 22 22 19*  GLUCOSE 51*  --  87   < > 235*   < > 200* 154* 144*  BUN 74*  --  64*   < > 56*   < > 32* 29* 22  CREATININE 4.41*  --  3.36*   < > 1.98*   < > 1.77* 1.98* 1.72*  CALCIUM  8.0*  --  8.3*   < > 8.2*   < > 9.0 8.4* 8.5*  GFRNONAA 14*  --  20*   < > 37*   < > 43* 37* 44*  PROT 4.9* 5.2*  --   --  5.6*  --   --   --   --   ALBUMIN 1.9* 1.9* 1.9*  --  2.0*  --   --   --   --   AST 114* 65*  --   --  13*  --   --   --   --   ALT 139* 116*  --   --  40  --   --   --   --   ALKPHOS 63 77  --   --  82  --   --   --   --   BILITOT 0.5 0.3  --   --  0.7  --   --   --   --   BILIDIR  --  0.1  --   --   --   --   --   --   --   IBILI  --  0.2*  --   --   --   --   --   --   --    < > = values in this interval not displayed.     No results found.  Symptomatic anemia #Anemia-symptomatic fatigue hemoglobin 9-10--likely multifactorial-iron  malabsorption/celiac disease/CKD-stage MAY 2024- Bone marrow biopsy/aspiration- The bone marrow is generally normocellular for age with trilineage  hematopoiesis and nonspecific changes, likely secondary in nature in  this setting.  There is no morphologic evidence of a lymphoproliferative  process or plasma cell neoplasm.   # Anemia: hb 9.6- improving- JAN 2025-ferritin- > 1000-  -iron  saturation- 19 -HOLD further retacrit   as patient is undergoing active chemotherapy for curable cancer.  HOLD venofer  today  # 09/07/2023: Stage II (cT3, cN1, cM0, p16+)  -currently s/p definitive chemoradiation with CarboTaxol weekly [[facility]] -clinical response noted.   02/09/2024- -Overall uptake in right palatine tonsil and right level 2 node has resolved. There is no definite evidence of metabolically active disease at the present time. -No new hypermetabolic lesions to suggest distant metastatic disease. [facility] to [facility]- appt with surgeon.    # chronic urinary obstruction- s/p SPC-Ckd stage III [nephro, Dr.Bhutani [place]]-diabetes- GFR- 40s-  stable  # Diabetes  [[facility] Hb A1c- 7.4]- continue follow-up with endocrinology/monitoring of blood sugars. stable  # DISPOSITION: # NO Venofer  today; NO retacrit  # follow up in 3 months-; MD;  labs- cbc/bmp;iron  studies;ferritin- ;possible venofer  or retacrit -  -Dr.B  All questions  were answered. The patient knows to call the clinic with any problems, questions or concerns.    Gwyn Leos, MD 04/12/2024 11:09 AM

## 2024-04-12 NOTE — Progress Notes (Signed)
 Patient states he does not have any new or acute concerns at this time.

## 2024-04-12 NOTE — Assessment & Plan Note (Addendum)
#  Anemia-symptomatic fatigue hemoglobin 9-10--likely multifactorial-iron  malabsorption/celiac disease/CKD-stage MAY 2024- Bone marrow biopsy/aspiration- The bone marrow is generally normocellular for age with trilineage  hematopoiesis and nonspecific changes, likely secondary in nature in  this setting.  There is no morphologic evidence of a lymphoproliferative  process or plasma cell neoplasm.   # Anemia: hb 9.6- improving- JAN 2025-ferritin- > 1000-  -iron  saturation- 19 -HOLD further retacrit   as patient is undergoing active chemotherapy for curable cancer.  HOLD venofer  today  # 09/07/2023: Stage II (cT3, cN1, cM0, p16+)  -currently s/p definitive chemoradiation with CarboTaxol weekly [[facility]] -clinical response noted.   02/09/2024- -Overall uptake in right palatine tonsil and right level 2 node has resolved. There is no definite evidence of metabolically active disease at the present time. -No new hypermetabolic lesions to suggest distant metastatic disease. [facility] to [facility]- appt with surgeon.    # chronic urinary obstruction- s/p SPC-Ckd stage III [nephro, Dr.Bhutani [place]]-diabetes- GFR- 40s-  stable  # Diabetes [[facility] Hb A1c- 7.4]- continue follow-up with endocrinology/monitoring of blood sugars. stable  # DISPOSITION: # NO Venofer  today; NO retacrit  # follow up in 3 months-; MD;  labs- cbc/bmp;iron  studies;ferritin- ;possible venofer  or retacrit -  -Dr.B

## 2024-04-13 ENCOUNTER — Ambulatory Visit: Admitting: Internal Medicine

## 2024-04-13 ENCOUNTER — Ambulatory Visit

## 2024-04-13 ENCOUNTER — Other Ambulatory Visit

## 2024-04-17 ENCOUNTER — Ambulatory Visit (HOSPITAL_BASED_OUTPATIENT_CLINIC_OR_DEPARTMENT_OTHER): Admitting: Pulmonary Disease

## 2024-04-24 ENCOUNTER — Encounter (HOSPITAL_BASED_OUTPATIENT_CLINIC_OR_DEPARTMENT_OTHER): Payer: Self-pay | Admitting: Primary Care

## 2024-04-24 ENCOUNTER — Ambulatory Visit (HOSPITAL_BASED_OUTPATIENT_CLINIC_OR_DEPARTMENT_OTHER): Admitting: Primary Care

## 2024-04-24 VITALS — BP 137/68 | HR 56 | Ht 68.0 in | Wt 173.2 lb

## 2024-04-24 DIAGNOSIS — G4733 Obstructive sleep apnea (adult) (pediatric): Secondary | ICD-10-CM | POA: Diagnosis not present

## 2024-04-24 NOTE — Progress Notes (Signed)
 @Patient  ID: Brian Nielsen, male    DOB: 1961/09/01, 63 y.o.   MRN: [id number]  No chief complaint on file.   Referring provider: Gwenevere Lent, FNP  HPI: 63 year old male, never smoked (prior snuff use). PMH significant for HTN, oropharynx cancer, OSA, GERD, type 2 diabetes with CKD, HLD, vit D deficiency.   Previous [facility] pulmonary encounter: 10/07/23- Dr. Villa Greaser, video visit   Initial OV was 12/2022.  We performed home sleep test which showed moderate OSA.  Due to his previous CPAP intolerance, we will proceed with CPAP titration study which showed requirement of 14 cm. We placed him on auto CPAP 10 to 14 cm. Unfortunately he was diagnosed with throat cancer 07/2023 and is undergoing RT and chemotherapy at [facility].  He has developed oral thrush and this is bothering him History corroborated by wife, he reports increased tiredness and sleepiness in the daytime   Review of meds shows gabapentin and amitriptyline  that he has been on for many years for neuropathy, clonidine  has been recently added for hypertension He reports sleeping on his side and some air leakage into his eyes.  He would like to know if CPAP is helping him   OSA on CPAP -CPAP report was reviewed which shows good control of events on auto settings 10 to 14 cm with average pressure of 13 cm and mild leak.  His compliance is good with average usage 5 hours and a few missed nights.  Residual events are few. Unfortunately he still has residual hypersomnolence - Trial of extra small airfit F30 mask to [employer]   If that does not work, please call [phone] for a mask fitting appt   Hypersomnolence -is multifactorial and may be related to sedative meds such as clonidine , gabapentin especially in the context of CKD.  Also may be related to his ongoing cancer treatment with radiation and chemotherapy If this persist we could consider adding a stimulant but I asked him to explore with his other doctors if the other medications can be  decreased      04/24/2024- interim hx  Discussed the use of AI scribe software for clinical note transcription with the patient, who gave verbal consent to proceed.  History of Present Illness   Brian Nielsen is a 63 year old male with sleep apnea who presents for a 6 month follow-up visit.  He experiences ongoing issues with sleep apnea management, including episodes where he removes his CPAP mask during the night due to a sensation of not being able to breathe. CPAP usage is consistent 78% of the time over the last 90 days, averaging about 5 hours and 12 minutes per night. Current CPAP pressure settings are between 10 to 14 cm H2O.  He experiences discomfort with the full face mask, with air leaks causing discomfort in his eyes. He has tried a mask with a hose that came out from top of the head but did not like it and has not tried other mask types recently. He sometimes feels more rested without using the CPAP mask.  He has difficulty falling asleep, which he attributes to neuropathy in his feet. He takes gabapentin and amitriptyline  for neuropathy. He believes he has tried lyrica in the past as well. He stays up late, often going to bed around 2 AM, and wakes up around 8 AM, getting approximately 5 to 6 hours of sleep.  He experiences significant daytime fatigue, feeling nonfunctional at times. His blood pressure is  difficult to control, with fluctuations from very low to very high readings. He has chronic kidney disease and is under the care of a kidney doctor. He also has a history of throat cancer, for which he completed radiation and chemotherapy, resulting in facial radiation dermatitis that has since improved.  He has retinopathy and receives injections in his eyes. He has a history of cancer-related pain management issues, including a past bad experience with fentanyl . No use of sleep aids.     Airview download 01/24/24-04/22/24 Usage 70/90 days (78%); 58 dasy (64%)  Average usage days  used 5 hours 12 mins Pressure 10-14cm h20 (13.2cm h20-95%) Airleaks 3.2L/min (95%) AHI 3.9    Significant tests/ events reviewed   HST 02/2023 showed moderate OSA with AHI 16/ hr, low sat 70% (Snap)  CPAP titration 04/2023 >> 14 cm  Allergies  Allergen Reactions   Codeine Rash    Immunization History  Administered Date(s) Administered   Influenza Inj Mdck Quad Pf 08/24/2018, 08/03/2019, 08/23/2022   Influenza,inj,Quad PF,6+ Mos 10/19/2016   Influenza-Unspecified 10/19/2016, 09/01/2020, 09/24/2021   Janssen (J&J) SARS-COV-2 Vaccination 06/16/2020   Moderna Sars-Covid-2 Vaccination 10/08/2020   PNEUMOCOCCAL CONJUGATE-20 10/11/2021   Pneumococcal Polysaccharide-23 08/15/2020   Tdap 10/19/2016   Zoster Recombinant(Shingrix) 10/11/2021, 04/05/2022    Past Medical History:  Diagnosis Date   Adult celiac disease 2015   followed by dr Ole Berkeley (GI)   Anemia associated with chronic renal failure    hematologist--- dr Rennie Casco;  treated with iron  infusions   Benign localized prostatic hyperplasia with lower urinary tract symptoms (LUTS)    urologist--- dr Joie Narrow   Cancer [facility]) 08/18/2023   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   Charcot foot due to diabetes mellitus (HCC)    Chronic constipation    CKD (chronic kidney disease), stage III [facility])    nephrologist--- dr Carrolyn Clan;  renal bx 11-20-2021   Diverticulosis of colon    Edema of both lower extremities    GERD (gastroesophageal reflux disease)    History of adenomatous polyp of colon    HTN (hypertension)    Hyperlipidemia, mixed    Peripheral neuropathy    Peyronie's disease    Pneumonia    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 meatal stricture    Retinopathy due to secondary diabetes mellitus (HCC)    both eyes  treated with injecitons   Thoracic spondylosis without myelopathy 11/22/2016   Type 2 diabetes mellitus [facility])    endocrinologist--- dr nida   Vitamin D  deficiency    Wears hearing aid in both ears      Tobacco History: Social History   Tobacco Use  Smoking Status Never  Smokeless Tobacco Former   Types: Snuff   Counseling given: Not Answered   Outpatient Medications Prior to Visit  Medication Sig Dispense Refill   aspirin  81 MG EC tablet Take 81 mg by mouth daily.     atorvastatin  (LIPITOR) 40 MG tablet Take 40 mg by mouth at bedtime.     cloNIDine  (CATAPRES ) 0.2 MG tablet Take 1 tablet (0.2 mg total) by mouth 3 (three) times daily. (Patient taking differently: Take 0.2 mg by mouth 2 (two) times daily.) 90 tablet 1   empagliflozin (JARDIANCE) 10 MG TABS tablet Take 10 mg by mouth daily.     feeding supplement (ENSURE ENLIVE / ENSURE PLUS) LIQD Take 237 mLs by mouth 3 (three) times daily between meals. (Patient taking differently: Take 237 mLs by mouth daily.)     furosemide (LASIX) 20 MG  tablet Take 20 mg by mouth daily as needed for fluid or edema. prn     gabapentin (NEURONTIN) 300 MG capsule Take 1,500 capsules by mouth in the morning and at bedtime.     hydrALAZINE  (APRESOLINE ) 10 MG tablet Take 10 mg by mouth.     insulin  glargine (LANTUS  SOLOSTAR) 100 UNIT/ML Solostar Pen Inject 20 Units into the skin 2 (two) times daily. (Patient taking differently: Inject 35 Units into the skin daily as needed (High bg).)     lisinopril  (ZESTRIL ) 20 MG tablet Take 20 mg by mouth daily.     metoprolol  tartrate (LOPRESSOR ) 25 MG tablet Take 0.5 tablets by mouth 2 (two) times daily.     mirabegron ER (MYRBETRIQ) 50 MG TB24 tablet Take 1 tablet by mouth daily.     pantoprazole  (PROTONIX ) 40 MG tablet Take 40 mg by mouth daily.     polyethylene glycol (MIRALAX  / GLYCOLAX ) 17 g packet Take 17 g by mouth daily as needed for mild constipation or moderate constipation (1 capful).     sodium bicarbonate  650 MG tablet Take 1 tablet (650 mg total) by mouth 2 (two) times daily.     tirzepatide  (MOUNJARO ) 15 MG/0.5ML Pen Inject 15 mg into the skin once a week. 6 mL 1   tolterodine (DETROL LA) 4 MG 24  hr capsule Take 4 mg by mouth daily.     topiramate  (TOPAMAX ) 100 MG tablet Take one tablet (100 mg dose) by mouth 2 (two) times daily.     traMADol  (ULTRAM ) 50 MG tablet Take 1 tablet by mouth every 6 (six) hours as needed.     No facility-administered medications prior to visit.   Review of Systems  Review of Systems  Constitutional:  Positive for fatigue.  HENT: Negative.    Respiratory: Negative.  Negative for shortness of breath.   Cardiovascular: Negative.   Psychiatric/Behavioral:  Positive for sleep disturbance.    Physical Exam  There were no vitals taken for this visit. Physical Exam Constitutional:      Appearance: Normal appearance. He is not ill-appearing.  HENT:     Head: Normocephalic and atraumatic.     Mouth/Throat:     Mouth: Mucous membranes are moist.     Pharynx: Oropharynx is clear.  Cardiovascular:     Rate and Rhythm: Normal rate and regular rhythm.     Comments: RRR Pulmonary:     Effort: Pulmonary effort is normal.     Breath sounds: Normal breath sounds.     Comments: CTA Musculoskeletal:     Cervical back: Normal range of motion and neck supple. No tenderness.  Skin:    General: Skin is warm and dry.  Neurological:     General: No focal deficit present.     Mental Status: He is alert and oriented to person, place, and time. Mental status is at baseline.  Psychiatric:        Mood and Affect: Mood normal.        Thought Content: Thought content normal.        Judgment: Judgment normal.      Lab Results:  CBC    Component Value Date/Time   WBC 3.8 (L) 04/12/2024 1014   WBC 4.5 01/11/2024 1444   RBC 3.15 (L) 04/12/2024 1014   HGB 9.6 (L) 04/12/2024 1014   HCT 29.2 (L) 04/12/2024 1014   PLT 125 (L) 04/12/2024 1014   MCV 92.7 04/12/2024 1014   MCH 30.5 04/12/2024 1014  MCHC 32.9 04/12/2024 1014   RDW 13.5 04/12/2024 1014   LYMPHSABS 0.4 (L) 02/03/2024 1421   MONOABS 0.5 02/03/2024 1421   EOSABS 0.2 02/03/2024 1421   BASOSABS 0.0  02/03/2024 1421    BMET    Component Value Date/Time   NA 139 04/12/2024 1014   K 4.1 04/12/2024 1014   CL 112 (H) 04/12/2024 1014   CO2 19 (L) 04/12/2024 1014   GLUCOSE 144 (H) 04/12/2024 1014   BUN 22 04/12/2024 1014   CREATININE 1.72 (H) 04/12/2024 1014   CREATININE 0.88 01/11/2017 0805   CALCIUM  8.5 (L) 04/12/2024 1014   CALCIUM  8.5 (L) 12/09/2021 1545   GFRNONAA 44 (L) 04/12/2024 1014   GFRNONAA >89 01/11/2017 0805   GFRAA >89 01/11/2017 0805    BNP    Component Value Date/Time   BNP 124.0 (H) 05/12/2021 1650    ProBNP No results found for: "PROBNP"  Imaging: No results found.   Assessment & Plan:   1. OSA (obstructive sleep apnea) (Primary) - Ambulatory Referral for DME  Assessment and Plan    Obstructive Sleep Apnea Obstructive sleep apnea with current CPAP settings showing 3.9 events per hour, indicating well-controlled apnea but with occasional apneic events. Reports difficulty with current full face mask, experiencing air leaks causing discomfort and waking up feeling unable to breathe. Current CPAP pressure settings are 10 to 14 cm H2O. Feels more rested without CPAP, indicating possible intolerance to current setup. Discussed potential benefits of adjusting CPAP settings and trying a different mask to improve comfort and reduce air leaks.Alternative options include trying a hybrid mask and adjusting pressure settings. - Adjust CPAP pressure settings from 10-14 cm H2O to 12-16 cm H2O. - Recommend trying ResMed AirFit F30 full face hybrid mask or [employer] Evora full face mask. - Order fitting for new mask at medical supply store. - Monitor response to new mask and pressure settings via MyChart. - Consider sleep lab titration study if issues persist.  Hypersomnia - Patient is moderately compliant with CPAP use, he has been having issues sleeping due to neuropathy and CPAP tolerance. Hypersomnia may be medication related due to patient's CKD but  patient has been on gabapentin for a long time and denies drowsiness with use. Suspect insomnia may be contributing. Would potentially consider sleep aid to help with insomnia/ CPAP tolerance vs stimulant. Would advise caution with stimulant due to hypertension and labile blood pressure.   Hypertension Uncontrolled hypertension with significant fluctuations in blood pressure readings. Reports episodes of very low blood pressure causing fatigue and high blood pressure without symptoms. Current management includes multiple antihypertensive medications. Discussed potential impact of stimulants on blood pressure and preference to avoid them due to current blood pressure management challenges.  Chronic Kidney Disease Chronic kidney disease with associated difficulty in managing blood pressure. Reports fluctuating blood pressure readings, with episodes of low blood pressure causing symptoms of fatigue and non-functionality. Current management includes multiple antihypertensive medications prescribed by kidney specialist.  Diabetic Retinopathy Diabetic retinopathy with ongoing management including ocular injections. Reports air leaks from CPAP mask causing discomfort in eyes, potentially exacerbating ocular issues.  Peripheral Neuropathy Peripheral neuropathy with symptoms managed by gabapentin and amitriptyline . Reports difficulty falling asleep due to neuropathy symptoms. Previous trials of Lyrica were unsuccessful. No current pain specialist involvement.  Throat Cancer Throat cancer with previous radiation and chemotherapy completed. Experienced radiation dermatitis but has since improved.   Goals of Care Expresses preference for sleep aid over stimulants due to concerns  about blood pressure management. Discussed potential options for improving sleep quality and managing daytime fatigue. Prefers to avoid stimulants due to existing hypertension and chronic kidney disease management  challenges.  Follow-up Monitor response to new CPAP settings and mask, and communicate any issues via MyChart. Potential follow-up with sleep lab titrati on study if current adjustments do not resolve issues.  Recording duration: 19 minutes        Brian Baumgarten, NP 04/24/2024

## 2024-04-24 NOTE — Patient Instructions (Signed)
 -  OBSTRUCTIVE SLEEP APNEA: Obstructive sleep apnea is a condition where your airway becomes blocked during sleep, causing breathing pauses. We will adjust your CPAP pressure settings from 10-14 cm H2O to 12-16 cm H2O and recommend trying a new mask, either the ResMed AirFit F30 full face hybrid mask or the [employer] full face mask. You will be fitted for a new mask at a medical supply store, and we will monitor your response to these changes via MyChart. If issues persist, we may consider sleep aid and/or sleep lab titration study.  -HYPERTENSION: Hypertension is high blood pressure, which can fluctuate and cause symptoms like fatigue. We discussed the impact of stimulants on your blood pressure and decided to avoid them due to your current management challenges. Your blood pressure will continue to be managed with your current medications.  -CHRONIC KIDNEY DISEASE: Chronic kidney disease is a condition where your kidneys are damaged and can't filter blood as well as they should. This can make managing your blood pressure more difficult. Your kidney specialist will continue to manage your condition and medications.  -DIABETIC RETINOPATHY: Diabetic retinopathy is an eye condition caused by diabetes that affects the blood vessels in your retina. You will continue to receive ocular injections as part of your ongoing management. We also discussed how air leaks from your CPAP mask might be causing discomfort in your eyes.  -PERIPHERAL NEUROPATHY: Peripheral neuropathy is nerve damage that causes pain, usually in your hands and feet. You are currently managing this with gabapentin and amitriptyline . We discussed your difficulty falling asleep due to neuropathy symptoms and noted that previous trials of Lyrica were unsuccessful.  -THROAT CANCER: You have a history of throat cancer, for which you completed radiation and chemotherapy. You experienced radiation dermatitis, which has since improved.  There is no current active treatment needed for your throat cancer.  -GOALS OF CARE: We discussed your preference for a sleep aid over stimulants due to concerns about blood pressure management. We will focus on improving your sleep quality and managing daytime fatigue without using stimulants.  INSTRUCTIONS: Please monitor your response to the new CPAP settings and mask, and communicate any issues via MyChart. If the adjustments do not resolve your issues, we may consider a sleep lab titration study. Continue to follow up with your kidney specialist for chronic kidney disease management and your eye doctor for diabetic retinopathy treatment.  Follow-up 4 months with Dr. Alva

## 2024-05-14 ENCOUNTER — Other Ambulatory Visit (HOSPITAL_COMMUNITY): Payer: Self-pay

## 2024-05-15 ENCOUNTER — Other Ambulatory Visit (HOSPITAL_COMMUNITY): Payer: Self-pay

## 2024-05-16 ENCOUNTER — Other Ambulatory Visit (HOSPITAL_COMMUNITY): Payer: Self-pay

## 2024-05-21 ENCOUNTER — Other Ambulatory Visit (HOSPITAL_COMMUNITY): Payer: Self-pay

## 2024-05-22 ENCOUNTER — Other Ambulatory Visit (HOSPITAL_COMMUNITY): Payer: Self-pay

## 2024-06-20 NOTE — Progress Notes (Unsigned)
 No chief complaint on file.   History of Present Illness: 63 yo male with history of HTN, HLDCKD stage 4, DM, head and neck cancer who is here as a new patient for evaluation of ***he has been followed by cardiology at [facility]. Echo in 2024 with normal LV systolic function, no valve issues. Nuclear stress test July 2025 at [place] with no ischemia. 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   Charcot foot due to diabetes mellitus (HCC)    Chronic constipation    CKD (chronic kidney disease), stage III [facility])    nephrologist--- dr rachele;  renal bx 11-20-2021   Diverticulosis of colon    Edema of both lower extremities    GERD (gastroesophageal reflux disease)    History of adenomatous polyp of colon    HTN (hypertension)    Hyperlipidemia, mixed    Peripheral neuropathy    Peyronie's disease    Pneumonia    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 meatal stricture    Retinopathy due to secondary diabetes mellitus (HCC)    both eyes  treated with injecitons   Thoracic spondylosis without myelopathy 11/22/2016   Type 2 diabetes mellitus [facility])    endocrinologist--- dr nida   Vitamin D  deficiency    Wears hearing aid in both ears     Past Surgical History:  Procedure Laterality Date   BIOPSY N/A 09/07/2021   Procedure: BIOPSY;  Surgeon: jinny Carmine, MD;  Location: [facility];  Service: Endoscopy;  Laterality: N/A;   BOTOX  INJECTION N/A 01/19/2024   Procedure: BOTOX  INJECTION;  Surgeon: Elisabeth Valli BIRCH, MD;  Location: [facility];  Service: Urology;   Laterality: N/A;   CATARACT EXTRACTION W/ INTRAOCULAR LENS IMPLANT Bilateral 2023   COLONOSCOPY N/A 05/03/2014   Procedure: COLONOSCOPY;  Surgeon: Margo LITTIE Haddock, MD;  [facility];  Service: Endoscopy;  Laterality: N/A;  12:00   COLONOSCOPY WITH PROPOFOL  N/A 09/07/2021   Procedure: COLONOSCOPY WITH PROPOFOL ;  Surgeon: jinny Carmine, MD;  Location: [facility];  Service: Endoscopy;  Laterality: N/A;   CYSTOSCOPY N/A 01/19/2024   Procedure: CYSTOSCOPY,SUPRAPUBIC TUBE EXCHANGE;  Surgeon: Elisabeth Valli BIRCH, MD;  Location: [facility];  Service: Urology;  Laterality: N/A;  30 MINUTE CASE   CYSTOSCOPY WITH INJECTION N/A 07/26/2023   Procedure: CYSTOSCOPY WITH BOTOX  INJECTION 100 UNITS;  Surgeon: Elisabeth Valli BIRCH, MD;  Location: [facility];  Service: Urology;  Laterality: N/A;  30 minutes   CYSTOSCOPY WITH URETHRAL DILATATION N/A 01/10/2023   Procedure: CYSTOSCOPY;  Surgeon: matilda Senior, MD;  Location: [facility]  Service: Urology;  Laterality: N/A;   ESOPHAGOGASTRODUODENOSCOPY N/A 05/03/2014   Procedure: ESOPHAGOGASTRODUODENOSCOPY (EGD);  Surgeon: Margo LITTIE Haddock, MD;  [facility];  Service: Endoscopy;  Laterality: N/A;   ESOPHAGOGASTRODUODENOSCOPY (EGD) WITH PROPOFOL  N/A 06/03/2020   Procedure: ESOPHAGOGASTRODUODENOSCOPY (EGD) WITH PROPOFOL ;  Surgeon: Maryruth Ole DASEN, MD;  Location: [facility];  Service: Endoscopy;  Laterality: N/A;   ESOPHAGOGASTRODUODENOSCOPY (EGD) WITH PROPOFOL  N/A 09/07/2021   Procedure: ESOPHAGOGASTRODUODENOSCOPY (EGD) WITH  PROPOFOL ;  Surgeon: Jinny Carmine, MD;  Location: [facility];  Service: Endoscopy;  Laterality: N/A;  Diabetic   IR BONE MARROW BIOPSY & ASPIRATION  04/27/2023   MEATOTOMY N/A 01/10/2023   Procedure: MEATOTOMY ADULT;  Surgeon: Matilda Senior, MD;  Location: [facility];  Service: Urology;  Laterality: N/A;   PARS PLANA VITRECTOMY Right 03/21/2021   Procedure: PARS PLANA VITRECTOMY 25  GAUGE WITH INJECTION OF ANTIBIOTICS FOR ENDOPHTHALMITIS;  Surgeon: Tobie Baptist, MD;  Location: [facility];  Service: Ophthalmology;  Laterality: Right;   POLYPECTOMY N/A 09/07/2021   Procedure: POLYPECTOMY;  Surgeon: Jinny Carmine, MD;  Location: [facility];  Service: Endoscopy;  Laterality: N/A;   REPAIR EXTENSOR TENDON Right 08/11/2022   Procedure: EXTENSOR CARPI ULNARIS TENDON DEBRIDEMENT, POSSIBLE SUBSHEATH RECONSTRUCTION;  Surgeon: Romona Harari, MD;  Location: [facility];  Service: Orthopedics;  Laterality: Right;  or regional plus MAC    Current Outpatient Medications  Medication Sig Dispense Refill   aspirin  81 MG EC tablet Take 81 mg by mouth daily.     atorvastatin  (LIPITOR) 40 MG tablet Take 40 mg by mouth at bedtime.     cloNIDine  (CATAPRES ) 0.2 MG tablet Take 1 tablet (0.2 mg total) by mouth 3 (three) times daily. (Patient taking differently: Take 0.2 mg by mouth 2 (two) times daily.) 90 tablet 1   empagliflozin (JARDIANCE) 10 MG TABS tablet Take 10 mg by mouth daily.     feeding supplement (ENSURE ENLIVE / ENSURE PLUS) LIQD Take 237 mLs by mouth 3 (three) times daily between meals. (Patient taking differently: Take 237 mLs by mouth daily.)     furosemide (LASIX) 20 MG tablet Take 20 mg by mouth daily as needed for fluid or edema. prn     gabapentin (NEURONTIN) 300 MG capsule Take 1,500 capsules by mouth in the morning and at bedtime.     hydrALAZINE  (APRESOLINE ) 10 MG tablet Take 10 mg by mouth.     insulin  glargine (LANTUS  SOLOSTAR) 100 UNIT/ML Solostar Pen Inject 20 Units into the skin 2 (two) times daily. (Patient taking differently: Inject 35 Units into the skin daily as needed (High bg).)     lisinopril  (ZESTRIL ) 20 MG tablet Take 20 mg by mouth daily.     metoprolol  tartrate (LOPRESSOR ) 25 MG tablet Take 0.5 tablets by mouth 2 (two) times daily.     mirabegron ER (MYRBETRIQ) 50 MG TB24 tablet Take 1 tablet by mouth daily.     pantoprazole  (PROTONIX )  40 MG tablet Take 40 mg by mouth daily.     polyethylene glycol (MIRALAX  / GLYCOLAX ) 17 g packet Take 17 g by mouth daily as needed for mild constipation or moderate constipation (1 capful).     sodium bicarbonate  650 MG tablet Take 1 tablet (650 mg total) by mouth 2 (two) times daily.     tirzepatide  (MOUNJARO ) 15 MG/0.5ML Pen Inject 15 mg into the skin once a week. 6 mL 1   tolterodine (DETROL LA) 4 MG 24 hr capsule Take 4 mg by mouth daily.     topiramate  (TOPAMAX ) 100 MG tablet Take one tablet (100 mg dose) by mouth 2 (two) times daily.     traMADol  (ULTRAM ) 50 MG tablet Take 1 tablet by mouth every 6 (six) hours as needed.     No current facility-administered medications for this visit.    Allergies  Allergen Reactions   Codeine Rash    Social History   Socioeconomic History   Marital  status: Married    Spouse name: [name]   Number of children: 1   Years of education: 12   Highest education level: Not on file  Occupational History   Occupation: Architect: [employer]  Tobacco Use   Smoking status: Former    Current packs/day: 0.00    Types: Cigarettes    Quit date: 1985    Years since quitting: 40.6   Smokeless tobacco: Former    Types: Snuff  Vaping Use   Vaping status: Never Used  Substance and Sexual Activity   Alcohol use: Not Currently    Comment: seldom   Drug use: Never   Sexual activity: Yes    Birth control/protection: Surgical  Other Topics Concern   Not on file  Social History Narrative   Lives with wife   [place]   Caffeine- diet Mtn Dew, 2 glasses   Works as Scientist, water quality for [employer]   Social Drivers of Corporate investment banker Strain: Low Risk  (11/23/2023)   Received from [facility]   Overall Financial Resource Strain (CARDIA)    Difficulty of Paying Living Expenses: Not hard at all  Food Insecurity: No Food Insecurity (04/13/2024)   Received from [facility]   Hunger  Vital Sign    Within the past 12 months, you worried that your food would run out before you got the money to buy more.: Never true    Within the past 12 months, the food you bought just didn't last and you didn't have money to get more.: Never true  Transportation Needs: No Transportation Needs (04/13/2024)   Received from [facility] - Transportation    Lack of Transportation (Medical): No    Lack of Transportation (Non-Medical): No  Physical Activity: Patient Declined (02/08/2023)   [facility] from [facility]   Exercise Vital Sign    On average, how many days per week do you engage in moderate to strenuous exercise (like a brisk walk)?: Patient declined    On average, how many minutes do you engage in exercise at this level?: Patient declined  Stress: No Stress Concern Present (02/08/2023)   Received from [facility] - Occupational Stress Questionnaire    Feeling of Stress : Not at all  Social Connections: Socially Integrated (02/08/2023)   Received from [facility]   Social Network    How would you rate your social network (family, work, friends)?: Good participation with social networks  Intimate Partner Violence: Not At Risk (11/06/2023)   Humiliation, Afraid, Rape, and Kick questionnaire    Fear of Current or Ex-Partner: No    Emotionally Abused: No    Physically Abused: No    Sexually Abused: No    Family History  Problem Relation Age of Onset   Aneurysm Mother        brain   Hypertension Mother    Cancer Father    Heart disease Father    Diabetes Father    Hypertension Father    Hyperlipidemia Father    Cancer Maternal Grandmother        melanoma   Heart disease Paternal Grandfather    Colon cancer Neg Hx     Review of Systems:  As stated in the HPI and otherwise negative.   There were no vitals taken for this visit.  Physical Examination: General: Well developed, well nourished, NAD  HEENT: OP  clear, mucus membranes moist  SKIN: warm, dry. No rashes. Neuro: No focal deficits  Musculoskeletal: Muscle strength 5/5 all ext  Psychiatric: Mood and affect normal  Neck: No JVD, no carotid bruits, no thyromegaly, no lymphadenopathy.  Lungs:Clear bilaterally, no wheezes, rhonci, crackles Cardiovascular: Regular rate and rhythm. No murmurs, gallops or rubs. Abdomen:Soft. Bowel sounds present. Non-tender.  Extremities: No lower extremity edema. Pulses are 2 + in the bilateral DP/PT.  EKG:  EKG {ACTION; IS/IS WNU:[id number]} ordered today. The ekg ordered today demonstrates ***  Recent Labs: 11/12/2023: ALT 40 11/13/2023: Magnesium 2.1 04/12/2024: BUN 22; Creatinine 1.72; Hemoglobin 9.6; Platelet Count 125; Potassium 4.1; Sodium 139   Lipid Panel    Component Value Date/Time   CHOL 122 01/11/2017 0805   TRIG 388 (H) 01/11/2017 0805   HDL 29 (L) 01/11/2017 0805   CHOLHDL 4.2 01/11/2017 0805   VLDL 78 (H) 01/11/2017 0805   LDLCALC 15 01/11/2017 0805     Wt Readings from Last 3 Encounters:  04/24/24 173 lb 3.2 oz (78.6 kg)  04/12/24 177 lb 11.2 oz (80.6 kg)  02/03/24 183 lb 12.8 oz (83.4 kg)      Assessment and Plan:   1.   Labs/ tests ordered today include:  No orders of the defined types were placed in this encounter.    Disposition:   F/U with me in ***    Signed, Lonni Cash, MD, [facility] 06/20/2024 2:59 PM    [facility] [address] [place], [place], [place]  [address] Phone: [phone]; Fax: [phone]

## 2024-06-21 ENCOUNTER — Ambulatory Visit: Attending: Cardiovascular Disease | Admitting: Cardiovascular Disease

## 2024-06-21 ENCOUNTER — Encounter: Payer: Self-pay | Admitting: Cardiovascular Disease

## 2024-06-21 VITALS — BP 157/71 | HR 61 | Resp 16 | Ht 68.0 in | Wt 173.8 lb

## 2024-06-21 DIAGNOSIS — I10 Essential (primary) hypertension: Secondary | ICD-10-CM

## 2024-06-21 NOTE — Patient Instructions (Signed)
 Medication Instructions:  No changes *If you need a refill on your cardiac medications before your next appointment, please call your pharmacy*  Lab Work: none If you have labs (blood work) drawn today and your tests are completely normal, you will receive your results only by: MyChart Message (if you have MyChart) OR A paper copy in the mail If you have any lab test that is abnormal or we need to change your treatment, we will call you to review the results.  Testing/Procedures: none  Follow-Up: At [facility], you and your health needs are our priority.  As part of our continuing mission to provide you with exceptional heart care, our providers are all part of one team.  This team includes your primary Cardiologist (physician) and Advanced Practice Providers or APPs (Physician Assistants and Nurse Practitioners) who all work together to provide you with the care you need, when you need it.  Your next appointment:   12 month(s)  Provider:   Antoinette Batman, MD     Other Instructions

## 2024-07-06 ENCOUNTER — Telehealth (HOSPITAL_BASED_OUTPATIENT_CLINIC_OR_DEPARTMENT_OTHER): Payer: Self-pay | Admitting: Pulmonary Disease

## 2024-07-06 NOTE — Telephone Encounter (Signed)
**Note De-identified  Woolbright Obfuscation** Please advise 

## 2024-07-06 NOTE — Telephone Encounter (Signed)
 Copied from CRM #[id number]. Topic: Appointments - Scheduling Inquiry for Clinic >> Jul 05, 2024  4:38 PM Joesph PARAS wrote: Reason for CRM: Patient states his blood pressure is higher in the mornings after using CPAP and, because of that, patient's nephrologist mentioned patient may try and Inspire implant. Patients interested in moving appointment up to discuss Inspire if possible. Please reach out to patient. >> Jul 05, 2024  4:46 PM Almarie PENNER wrote: Carren, would it be appropriate to move patient to September Inspire day to evaluate him or does he need to wait for his regular OV with RA in October?  >> Jul 05, 2024  4:42 PM Joesph PARAS wrote: Patient is requesting we call his wife specifically.     Patient is inquiring if he would be a candidate for Inspire. No sooner openings than his established appointment for 10/17. Dr Jude- if patient is a candidate, can he be seen on one of your Inspire dates or should he wait to be seen in October?

## 2024-07-09 ENCOUNTER — Ambulatory Visit: Admitting: Cardiovascular Disease

## 2024-07-11 ENCOUNTER — Encounter (HOSPITAL_BASED_OUTPATIENT_CLINIC_OR_DEPARTMENT_OTHER)
Admission: RE | Admit: 2024-07-11 | Discharge: 2024-07-11 | Disposition: A | Discharge status: Home / Self Care | Source: Ambulatory Visit | Attending: Orthopedic Surgery | Admitting: Orthopedic Surgery

## 2024-07-11 ENCOUNTER — Encounter (HOSPITAL_BASED_OUTPATIENT_CLINIC_OR_DEPARTMENT_OTHER): Payer: Self-pay | Admitting: Orthopedic Surgery

## 2024-07-11 ENCOUNTER — Other Ambulatory Visit: Payer: Self-pay

## 2024-07-11 DIAGNOSIS — Z01812 Encounter for preprocedural laboratory examination: Secondary | ICD-10-CM | POA: Diagnosis present

## 2024-07-11 DIAGNOSIS — D649 Anemia, unspecified: Secondary | ICD-10-CM | POA: Diagnosis not present

## 2024-07-11 LAB — BASIC METABOLIC PANEL WITH GFR
Anion gap: 11 (ref 5–15)
BUN: 30 mg/dL — ABNORMAL HIGH (ref 8–23)
CO2: 21 mmol/L — ABNORMAL LOW (ref 22–32)
Calcium: 8.8 mg/dL — ABNORMAL LOW (ref 8.9–10.3)
Chloride: 107 mmol/L (ref 98–111)
Creatinine, Ser: 2.44 mg/dL — ABNORMAL HIGH (ref 0.61–1.24)
GFR, Estimated: 29 mL/min — ABNORMAL LOW (ref >60)
Glucose, Bld: 138 mg/dL — ABNORMAL HIGH (ref 70–99)
Potassium: 4.3 mmol/L (ref 3.5–5.1)
Sodium: 139 mmol/L (ref 135–145)

## 2024-07-11 NOTE — Progress Notes (Signed)
   07/11/24 1114  PAT Phone Screen  Is the patient taking a GLP-1 receptor agonist? (S)  Yes (Mounjaro  LD 07-04-24)  Has the patient been informed on holding medication? Yes  Do You Have Diabetes? Yes  Do You Have Hypertension? Yes  Have You Ever Been to the ER for Asthma? No  Have You Taken Oral Steroids in the Past 3 Months? No  Do you Take Phenteramine or any Other Diet Drugs? No  Recent  Lab Work, EKG, CXR? Yes  Where was this test performed? 11-06-23 EKG, 03-20-24 A1c 5.4  Do you have a history of heart problems? (S)  No (03-10-23 ECHO EF 60-65%, 05-2024 Nuc stress test-no ischemia)  Any Recent Hospitalizations? No  Height 5' 8 (1.727 m)  Weight 79.4 kg  Pat Appointment Scheduled (S)  Yes (BMP)

## 2024-07-11 NOTE — Progress Notes (Signed)
 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 Drinking water  DENTAL: Sees his dentist regularly THYROID : Seeing a new PCP next week, advised he have TFT's checked then Neck fibrosis: Advised him to continue self massage. SURVEILLANCE/FOLLOW-UP:  ENT follow up with Dr. Denys in 08/2024 Will arrange follow up with our team in 11/2024 TOBACCO:  Using chewing tobacco, Strongly advised him to stop SCREENING/SURVIORSHIP: Has good support.    Toxicities: FOIS: 6. Total oral intake with no special preparation, but must avoid specific foods or liquid items PSS-HN: 80. All meat  Xerostomia:  Grade 1 Dysphagia:  Grade 0 or Baseline Aspiration:  Grade 0 or Baseline Dysgeusia:  Grade 1 Osteoradionecrosis of jaw:  Grade 0 or Baseline Trismus:  Grade 0 or Baseline Lymphedema:  Grade 1 Neck fibrosis:  Grade 0 or Baseline Hypothyroidism:  Grade 0 or Baseline Hoarseness:  Grade 0 or Baseline Laryngeal edema: Grade 0 or Baseline   INTERVAL HISTORY:   Overall doing well. Was approved for disability since our last visit with him. Continues to stay active. No difficulty swallowing or pain, eating a normal diet. Taste remains diminished. Moderate xerostomia. Denies symptoms or lymphedema or neck fibrosis. Declined offer for lymphedema PT in the past. Sees his dentist regularly.  Continues to use chewing tobacco. Has an appointment with a new internal medicine PCP next week.   REVIEW OF SYSTEMS: A comprehensive review of 10 systems was negative except for pertinent positives noted in HPI.  PAST MEDICAL HISTORY/FAMILY HISTORY/SOCIAL HISTORY:  Reviewed in EPIC  ALLERGIES/MEDICATIONS:  Reviewed in EPIC  PHYSICAL EXAM: Vital Signs for this encounter:  Temp 36.4 C (97.5 F) (Temporal)   Wt 78.4 kg (172 lb 14.4 oz)   BMI 26.29 kg/m  Wt Readings from Last 6 Encounters:  07/12/24 78.4 kg (172 lb 14.4 oz)  04/13/24 78.7 kg (173 lb 6.4 oz)  02/09/24 81.1 kg (178 lb 11.2 oz)  12/29/23 83.6 kg (184 lb 4.8 oz)  12/14/23 79.4 kg (175 lb)  11/03/23 80 kg (176 lb 6.4 oz)   Pain Pain Evaluation:                                 Denies Pain Pain Score (0 - 10):                               Pain Score (Wong-Baker Faces):       Pain Location:                                       Pain Frequency:  Karnofsky/Lansky Performance Status: 90,  Able to carry on normal activity; minor signs or symptoms of disease (ECOG equivalent 0) Water  Bottle:  YES General/Constitutional:   No acute distress, alert and oriented X 4  Neurological:   Normal Gait and Cognition.  CN II-XII focally intact Ears/Nose/Mouth/Throat: Normal tongue mobility. No Trismus.  Fair Dentition. No evidence of tumor in the oral cavity and oropharynx.  BL tonsillar fossae flat Lymphatics:  No palpable adenopathy on either side in the occipital, parotid, or facial nodes or in the level 1-6 nodal stations Mild fibrosis in the right neck  Nasopharyngolaryngoscopy: Flexible scope exam was performed in exam room 4, and revealed:  Nasopharynx: No mucosal lesions or masses.    Oropharynx: No mucosal lesions or masses.  Hypopharynx: No mucosal lesions or masses. No pooling of secretions in the pyriform apices bilaterally.  Larynx:  No mucosal lesions or masses.  Normal symmetric arytenoid  and vocal cord mobility.   07/12/2024 Endoscopic Procedure Note: PROCEDURE: Flexible fiberoptic nasopharyngolaryngoscopy  COMPLICATIONS: none  PROCEDURE: Following clinical examination of the patient, a NPL examination was indicated and performed for full evaluation of the extent of disease. The patient's mucosa was topically anesthetized with 1% lidocaine  sprayed directly into the nasal cavity and pharynx. The flexible fiberoptic nasopharyngoscope was prepared in the usual sterile fashion. The nasopharyngoscope was then passed through the patient's nasal cavity into the nasopharynx and then subsequently into the oropharynx, larynx and hypopharynx. The mucosa was carefully examined through the nasopharyngoscope. Findings are described in the physical examination as noted above.    RADIOLOGY:   No results found.    Labs:   No results found for: TSH  Electronically Signed: Shelba Ruths, PA-C Department of Radiation Oncology [facility]   -- I performed the NPL exam.  ATTENDING ATTESTATION:  I saw and evaluated/examined the patient, participating in the key portions of the service.  I discussed the findings, assessment, and plan of care with the resident/APP.  I agree with the findings and plan as documented in the resident/APP's note. The resident/APP peformed the fiberoptic nasopharyngoscopy and I was present and immediately available for the entiriety of the procedure and was present for the key and critical portions.    I personally spent 20 minutes face-to-face and non-face-to-face in the care of this patient, which includes all pre, intra, and post visit time on the date of service.   MAGDALENO Glendia Door, MD, PhD Assistant Professor Department of Radiation Oncology [facility]  [employer] [address] [place], [place] [address] O: [phone] 07/12/2024

## 2024-07-11 NOTE — Progress Notes (Signed)

## 2024-07-13 ENCOUNTER — Inpatient Hospital Stay (HOSPITAL_BASED_OUTPATIENT_CLINIC_OR_DEPARTMENT_OTHER): Admitting: Internal Medicine

## 2024-07-13 ENCOUNTER — Inpatient Hospital Stay: Attending: Internal Medicine

## 2024-07-13 ENCOUNTER — Inpatient Hospital Stay

## 2024-07-13 ENCOUNTER — Encounter: Payer: Self-pay | Admitting: Internal Medicine

## 2024-07-13 VITALS — BP 117/62 | HR 62 | Temp 96.8°F | Resp 12 | Ht 68.0 in | Wt 171.8 lb

## 2024-07-13 DIAGNOSIS — Z808 Family history of malignant neoplasm of other organs or systems: Secondary | ICD-10-CM | POA: Insufficient documentation

## 2024-07-13 DIAGNOSIS — N139 Obstructive and reflux uropathy, unspecified: Secondary | ICD-10-CM | POA: Diagnosis not present

## 2024-07-13 DIAGNOSIS — G629 Polyneuropathy, unspecified: Secondary | ICD-10-CM | POA: Insufficient documentation

## 2024-07-13 DIAGNOSIS — D649 Anemia, unspecified: Secondary | ICD-10-CM

## 2024-07-13 DIAGNOSIS — N183 Chronic kidney disease, stage 3 unspecified: Secondary | ICD-10-CM | POA: Insufficient documentation

## 2024-07-13 DIAGNOSIS — E1122 Type 2 diabetes mellitus with diabetic chronic kidney disease: Secondary | ICD-10-CM | POA: Insufficient documentation

## 2024-07-13 DIAGNOSIS — Z85810 Personal history of malignant neoplasm of tongue: Secondary | ICD-10-CM | POA: Diagnosis not present

## 2024-07-13 LAB — BASIC METABOLIC PANEL WITH GFR
Anion gap: 10 (ref 5–15)
BUN: 35 mg/dL — ABNORMAL HIGH (ref 8–23)
CO2: 21 mmol/L — ABNORMAL LOW (ref 22–32)
Calcium: 8.4 mg/dL — ABNORMAL LOW (ref 8.9–10.3)
Chloride: 106 mmol/L (ref 98–111)
Creatinine, Ser: 2.24 mg/dL — ABNORMAL HIGH (ref 0.61–1.24)
GFR, Estimated: 32 mL/min — ABNORMAL LOW (ref >60)
Glucose, Bld: 226 mg/dL — ABNORMAL HIGH (ref 70–99)
Potassium: 3.7 mmol/L (ref 3.5–5.1)
Sodium: 137 mmol/L (ref 135–145)

## 2024-07-13 LAB — FERRITIN: Ferritin: 393 ng/mL — ABNORMAL HIGH (ref 24–336)

## 2024-07-13 LAB — CBC WITH DIFFERENTIAL (CANCER CENTER ONLY)
Abs Immature Granulocytes: 0.01 K/uL (ref 0.00–0.07)
Basophils Absolute: 0 K/uL (ref 0.0–0.1)
Basophils Relative: 1 %
Eosinophils Absolute: 0.2 K/uL (ref 0.0–0.5)
Eosinophils Relative: 4 %
HCT: 29.9 % — ABNORMAL LOW (ref 39.0–52.0)
Hemoglobin: 9.4 g/dL — ABNORMAL LOW (ref 13.0–17.0)
Immature Granulocytes: 0 %
Lymphocytes Relative: 9 %
Lymphs Abs: 0.4 K/uL — ABNORMAL LOW (ref 0.7–4.0)
MCH: 30.6 pg (ref 26.0–34.0)
MCHC: 31.4 g/dL (ref 30.0–36.0)
MCV: 97.4 fL (ref 80.0–100.0)
Monocytes Absolute: 0.4 K/uL (ref 0.1–1.0)
Monocytes Relative: 11 %
Neutro Abs: 3.1 K/uL (ref 1.7–7.7)
Neutrophils Relative %: 75 %
Platelet Count: 158 K/uL (ref 150–400)
RBC: 3.07 MIL/uL — ABNORMAL LOW (ref 4.22–5.81)
RDW: 12.5 % (ref 11.5–15.5)
WBC Count: 4.1 K/uL (ref 4.0–10.5)
nRBC: 0 % (ref 0.0–0.2)

## 2024-07-13 LAB — IRON AND TIBC
Iron: 74 ug/dL (ref 45–182)
Saturation Ratios: 29 % (ref 17.9–39.5)
TIBC: 255 ug/dL (ref 250–450)
UIBC: 181 ug/dL

## 2024-07-13 NOTE — Assessment & Plan Note (Addendum)
#  Anemia-symptomatic fatigue hemoglobin 9-10--likely multifactorial-iron  malabsorption/celiac disease/CKD-stage MAY 2024- Bone marrow biopsy/aspiration- The bone marrow is generally normocellular for age with trilineage  hematopoiesis and nonspecific changes, likely secondary in nature in  this setting.  There is no morphologic evidence of a lymphoproliferative  process or plasma cell neoplasm.   # Anemia: hb 9.4 overall stable/- improving- JAN 2025-ferritin- > 300-  -iron  saturation- 128.-HOLD Retacrit   HOLD venofer  today.   # 09/07/2023: Stage II (cT3, cN1, cM0, p16+)  -currently s/p definitive chemoradiation with CarboTaxol weekly [[facility]] -clinical response noted.   02/09/2024- -Overall uptake in right palatine tonsil and right level 2 node has resolved. There is no definite evidence of metabolically active disease at the present time. -No new hypermetabolic lesions to suggest distant metastatic disease. Defre to [facility].   # chronic urinary obstruction- s/p SPC-Ckd stage III [nephro, Dr.Bhutani [place]]-diabetes- GFR- 30-  stable  # Diabetes [[facility] Hb A1c- 7.4]- continue follow-up with endocrinology/monitoring of blood sugars. stable  # DISPOSITION: # NO Venofer  today; NO retacrit  # follow up in 2  months-; MD;  labs- cbc/bmp;iron  studies;ferritin- ;possible venofer  or retacrit -  -Dr.B

## 2024-07-13 NOTE — Progress Notes (Signed)
 Fatigue/weakness: NO Dyspena: NO  Light headedness: NO  Blood in stool: NO

## 2024-07-13 NOTE — Progress Notes (Signed)
 [facility] CONSULT NOTE  Patient Care Team: Adine Duwaine MATSU, FNP as PCP - General (Family Medicine) Verlin Lonni BIRCH, MD as PCP - Cardiology (Cardiology) Orman Maize, NP (Inactive) as Nurse Practitioner (Endocrinology) Rennie Cindy SAUNDERS, MD as Consulting Physician (Oncology)  CHIEF COMPLAINTS/PURPOSE OF CONSULTATION: ANEMIA   HEMATOLOGY HISTORY:  # ANEMIA PROGRESSIVE since 2020 12; 2022- 10 MCV- 90s; colonoscopy 2015; multiple endoscopies-last September 2021-biopsy active celiac disease; MAY 2024- Bone marrow biopsy/aspiration- The bone marrow is generally normocellular for age with trilineage  hematopoiesis and nonspecific changes, likely secondary in nature in  this setting.  There is no morphologic evidence of a lymphoproliferative  process or plasma cell neoplasm.   #CKD stage III [[facility]]/diabetes [[facility]]; ? Celaic disease [[facility]];    - Labs: 07/2014 - tTG IgA 101 03/2020 - positive endomysial Ab IgA, tTG IgA 78 - CSY: 07/2014 - normal appearing terminal ileum, sigmoid diverticulosis, and non-bleeding internal hemorrhoids - EGD: 07/2014 - mild non-erosive gastritis, normal appearing duodenal mucosa with duodenal biopsies showing celiac sprue - EGD: 06/03/2020 - procedure aborted due to presence of food - EGD: 08/12/2020 - irregular Z-line found at [facility] with bx showing mild reflux esophagitis, 1 cm hiatal hernia, mild chronic gastritis, decreased folds found in entire duodenum, flattening found in entire duodenum and scalloped mucosa with bx showing active Celiac disease with villous atrophy, increased intraepithelial lymphocytes, etc. Repeat in 1-year.  HISTORY OF PRESENTING ILLNESS:  with wife.  Ambulating independently.  Brian Nielsen 63 y.o.  male symptomatic anemia-likely secondary to chronic renal disease-/chronic urinary obstruction- s/p SPC -IDA / poorly controlled diabetes-complications of CKD; peripheral neuropathy;and Hx  p16  positive tonsil cancer currently s/p  definitive chemoradiation at [facility] is here for follow-up.  Status post recent evaluation [facility]-as per patient no recurrent head and neck cancer.  Patient continues to have chronic mild to moderate fatigue  Patient appetite improving.  Is gaining weight.  Denies any swelling in the neck.   He denies any worsening swelling in the legs .  Review of Systems  Constitutional:  Positive for malaise/fatigue. Negative for chills, diaphoresis and fever.  HENT:  Negative for nosebleeds and sore throat.   Eyes:  Negative for double vision.  Respiratory:  Negative for cough, hemoptysis, sputum production and wheezing.   Cardiovascular:  Negative for chest pain, palpitations, orthopnea and leg swelling.  Gastrointestinal:  Negative for blood in stool, heartburn, melena, nausea and vomiting.  Genitourinary:  Negative for dysuria, frequency and urgency.  Musculoskeletal:  Positive for back pain and joint pain.  Skin: Negative.  Negative for itching and rash.  Neurological:  Positive for weakness. Negative for dizziness, tingling, focal weakness and headaches.  Endo/Heme/Allergies:  Does not bruise/bleed easily.  Psychiatric/Behavioral:  Negative for depression. The patient is not nervous/anxious and does not have insomnia.     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   Charcot foot due to diabetes mellitus (HCC)    Chronic constipation    CKD (chronic kidney disease), stage III [facility])    nephrologist--- dr rachele;  renal bx 11-20-2021   Diverticulosis  of colon    Edema of both lower extremities     GERD (gastroesophageal reflux disease)    History of adenomatous polyp of colon    HTN (hypertension)    Hyperlipidemia, mixed    Peripheral neuropathy    Peyronie's disease    Pneumonia    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 meatal stricture    Retinopathy due to secondary diabetes mellitus (HCC)    both eyes  treated with injecitons   Sleep apnea    uses CPAP nightly   Suprapubic catheter (HCC)    due to neurogenic bladder   Thoracic spondylosis without myelopathy 11/22/2016   Type 2 diabetes mellitus [facility])    endocrinologist--- dr nida   Vitamin D  deficiency    Wears hearing aid in both ears     SURGICAL HISTORY: Past Surgical History:  Procedure Laterality Date   BIOPSY N/A 09/07/2021   Procedure: BIOPSY;  Surgeon: Jinny Carmine, MD;  Location: [facility];  Service: Endoscopy;  Laterality: N/A;   BOTOX  INJECTION N/A 01/19/2024   Procedure: BOTOX  INJECTION;  Surgeon: Elisabeth Valli BIRCH, MD;  Location: [facility];  Service: Urology;  Laterality: N/A;   CATARACT EXTRACTION W/ INTRAOCULAR LENS IMPLANT Bilateral 2023   COLONOSCOPY N/A 05/03/2014   Procedure: COLONOSCOPY;  Surgeon: Margo LITTIE Haddock, MD;  [facility];  Service: Endoscopy;  Laterality: N/A;  12:00   COLONOSCOPY WITH PROPOFOL  N/A 09/07/2021   Procedure: COLONOSCOPY WITH PROPOFOL ;  Surgeon: Jinny Carmine, MD;  Location: [facility];  Service: Endoscopy;  Laterality: N/A;   CYSTOSCOPY N/A 01/19/2024   Procedure: CYSTOSCOPY,SUPRAPUBIC TUBE EXCHANGE;  Surgeon: Elisabeth Valli BIRCH, MD;  Location: [facility];  Service: Urology;  Laterality: N/A;  30 MINUTE CASE   CYSTOSCOPY WITH INJECTION N/A 07/26/2023   Procedure: CYSTOSCOPY WITH BOTOX  INJECTION 100 UNITS;  Surgeon: Elisabeth Valli BIRCH, MD;  Location: [facility];  Service: Urology;  Laterality: N/A;  30 minutes   CYSTOSCOPY WITH URETHRAL DILATATION N/A 01/10/2023   Procedure: CYSTOSCOPY;  Surgeon: Matilda Senior, MD;  Location: [facility];  Service:  Urology;  Laterality: N/A;   ESOPHAGOGASTRODUODENOSCOPY N/A 05/03/2014   Procedure: ESOPHAGOGASTRODUODENOSCOPY (EGD);  Surgeon: Margo LITTIE Haddock, MD;  [facility];  Service: Endoscopy;  Laterality: N/A;   ESOPHAGOGASTRODUODENOSCOPY (EGD) WITH PROPOFOL  N/A 06/03/2020   Procedure: ESOPHAGOGASTRODUODENOSCOPY (EGD) WITH PROPOFOL ;  Surgeon: Maryruth Ole DASEN, MD;  Location: [facility];  Service: Endoscopy;  Laterality: N/A;   ESOPHAGOGASTRODUODENOSCOPY (EGD) WITH PROPOFOL  N/A 09/07/2021   Procedure: ESOPHAGOGASTRODUODENOSCOPY (EGD) WITH PROPOFOL ;  Surgeon: Jinny Carmine, MD;  Location: [facility];  Service: Endoscopy;  Laterality: N/A;  Diabetic   IR BONE MARROW BIOPSY & ASPIRATION  04/27/2023   MEATOTOMY N/A 01/10/2023   Procedure: MEATOTOMY ADULT;  Surgeon: Matilda Senior, MD;  Location: [facility];  Service: Urology;  Laterality: N/A;   PARS PLANA VITRECTOMY Right 03/21/2021   Procedure: PARS PLANA VITRECTOMY 25 GAUGE WITH INJECTION OF ANTIBIOTICS FOR ENDOPHTHALMITIS;  Surgeon: Tobie Baptist, MD;  Location: [facility] OR;  Service: Ophthalmology;  Laterality: Right;   POLYPECTOMY N/A 09/07/2021   Procedure: POLYPECTOMY;  Surgeon: Jinny Carmine, MD;  Location: [facility];  Service: Endoscopy;  Laterality: N/A;   REPAIR EXTENSOR TENDON Right 08/11/2022   Procedure: EXTENSOR CARPI ULNARIS TENDON DEBRIDEMENT, POSSIBLE SUBSHEATH RECONSTRUCTION;  Surgeon: Romona Harari, MD;  Location: [facility];  Service: Orthopedics;  Laterality: Right;  or regional plus MAC    SOCIAL HISTORY: Social History  Socioeconomic History   Marital status: Married    Spouse name: [name]   Number of children: 1   Years of education: 12   Highest education level: Not on file  Occupational History   Occupation: Architect: [employer]  Tobacco Use   Smoking status: Former    Current packs/day: 0.00    Types: Cigarettes    Quit date:  1985    Years since quitting: 40.6   Smokeless tobacco: Current    Types: Snuff   Tobacco comments:    Dips daily  Vaping Use   Vaping status: Never Used  Substance and Sexual Activity   Alcohol use: Not Currently    Comment: seldom   Drug use: Never   Sexual activity: Yes  Other Topics Concern   Not on file  Social History Narrative   Lives with wife   [place]   Caffeine- diet Mtn Dew, 2 glasses   Works as Scientist, water quality for [employer]   Social Drivers of Corporate investment banker Strain: Low Risk  (11/23/2023)   Received from [facility]   Overall Financial Resource Strain (CARDIA)    Difficulty of Paying Living Expenses: Not hard at all  Food Insecurity: No Food Insecurity (04/13/2024)   Received from [facility]   Hunger Vital Sign    Within the past 12 months, you worried that your food would run out before you got the money to buy more.: Never true    Within the past 12 months, the food you bought just didn't last and you didn't have money to get more.: Never true  Transportation Needs: No Transportation Needs (04/13/2024)   Received from [facility] - Transportation    Lack of Transportation (Medical): No    Lack of Transportation (Non-Medical): No  Physical Activity: Patient Declined (02/08/2023)   Received from [facility]   Exercise Vital Sign    On average, how many days per week do you engage in moderate to strenuous exercise (like a brisk walk)?: Patient declined    On average, how many minutes do you engage in exercise at this level?: Patient declined  Stress: No Stress Concern Present (02/08/2023)   Received from [facility] of Occupational [facility] - Occupational Stress Questionnaire    Feeling of Stress : Not at all  Social Connections: Socially Integrated (02/08/2023)   Received from [facility]   Social Network    How would you rate your social network (family, work,  friends)?: Good participation with social networks  Intimate Partner Violence: Not At Risk (11/06/2023)   Humiliation, Afraid, Rape, and Kick questionnaire    Fear of Current or Ex-Partner: No    Emotionally Abused: No    Physically Abused: No    Sexually Abused: No    FAMILY HISTORY: Family History  Problem Relation Age of Onset   Aneurysm Mother        brain   Hypertension Mother    Cancer Father    Heart disease Father    Diabetes Father    Hypertension Father    Hyperlipidemia Father    Cancer Maternal Grandmother        melanoma   Heart disease Paternal Grandfather    Colon cancer Neg Hx     ALLERGIES:  is allergic to codeine.  MEDICATIONS:  Current Outpatient Medications  Medication Sig Dispense Refill   aspirin  81  MG EC tablet Take 81 mg by mouth daily.     atorvastatin  (LIPITOR) 40 MG tablet Take 40 mg by mouth at bedtime.     chlorthalidone (HYGROTON) 25 MG tablet Take 12.5 mg by mouth daily.     empagliflozin (JARDIANCE) 10 MG TABS tablet Take 10 mg by mouth daily.     feeding supplement (ENSURE ENLIVE / ENSURE PLUS) LIQD Take 237 mLs by mouth 3 (three) times daily between meals.     furosemide (LASIX) 20 MG tablet Take 20 mg by mouth daily as needed for fluid or edema. prn     gabapentin (NEURONTIN) 300 MG capsule Take 1,500 capsules by mouth in the morning and at bedtime.     insulin  glargine (LANTUS  SOLOSTAR) 100 UNIT/ML Solostar Pen Inject 20 Units into the skin 2 (two) times daily.     lisinopril  (ZESTRIL ) 20 MG tablet Take 20 mg by mouth daily.     pantoprazole  (PROTONIX ) 40 MG tablet Take 40 mg by mouth daily.     sodium bicarbonate  650 MG tablet Take 650 mg by mouth 3 (three) times daily.     tirzepatide  (MOUNJARO ) 15 MG/0.5ML Pen Inject 15 mg into the skin once a week. 6 mL 1   traMADol  (ULTRAM ) 50 MG tablet Take 1 tablet by mouth every 6 (six) hours as needed.     No current facility-administered medications for this visit.      PHYSICAL  EXAMINATION:  S/p Suprapubic catheter. 2cm LN in right neck; Right enlarged tonsil.  Vitals:   07/13/24 0900  BP: 117/62  Pulse: 62  Resp: 12  Temp: (!) 96.8 F (36 C)  SpO2: 100%     Filed Weights   07/13/24 0900  Weight: 77.9 kg      Physical Exam Vitals and nursing note reviewed.  Constitutional:      Comments:    HENT:     Head: Normocephalic and atraumatic.     Mouth/Throat:     Pharynx: Oropharynx is clear.  Eyes:     Extraocular Movements: Extraocular movements intact.     Pupils: Pupils are equal, round, and reactive to light.  Cardiovascular:     Rate and Rhythm: Normal rate and regular rhythm.  Pulmonary:     Comments: Decreased breath sounds bilaterally.  Abdominal:     Palpations: Abdomen is soft.  Musculoskeletal:        General: Normal range of motion.     Cervical back: Normal range of motion.  Skin:    General: Skin is warm.  Neurological:     General: No focal deficit present.     Mental Status: He is alert and oriented to person, place, and time.  Psychiatric:        Behavior: Behavior normal.        Judgment: Judgment normal.     LABORATORY DATA:  I have reviewed the data as listed Lab Results  Component Value Date   WBC 4.1 07/13/2024   HGB 9.4 (L) 07/13/2024   HCT 29.9 (L) 07/13/2024   MCV 97.4 07/13/2024   PLT 158 07/13/2024   Recent Labs    11/07/23 0427 11/08/23 0354 11/08/23 0355 11/09/23 0427 11/12/23 0449 11/13/23 0430 04/12/24 1014 07/11/24 1400 07/13/24 0901  NA 135  --  138   < > 149*   < > 139 139 137  K 4.7  --  3.7   < > 3.2*   < > 4.1 4.3 3.7  CL 107  --  109   < > 124*   < > 112* 107 106  CO2 20*  --  18*   < > 20*   < > 19* 21* 21*  GLUCOSE 51*  --  87   < > 235*   < > 144* 138* 226*  BUN 74*  --  64*   < > 56*   < > 22 30* 35*  CREATININE 4.41*  --  3.36*   < > 1.98*   < > 1.72* 2.44* 2.24*  CALCIUM  8.0*  --  8.3*   < > 8.2*   < > 8.5* 8.8* 8.4*  GFRNONAA 14*  --  20*   < > 37*   < > 44* 29* 32*   PROT 4.9* 5.2*  --   --  5.6*  --   --   --   --   ALBUMIN 1.9* 1.9* 1.9*  --  2.0*  --   --   --   --   AST 114* 65*  --   --  13*  --   --   --   --   ALT 139* 116*  --   --  40  --   --   --   --   ALKPHOS 63 77  --   --  82  --   --   --   --   BILITOT 0.5 0.3  --   --  0.7  --   --   --   --   BILIDIR  --  0.1  --   --   --   --   --   --   --   IBILI  --  0.2*  --   --   --   --   --   --   --    < > = values in this interval not displayed.     No results found.  Symptomatic anemia #Anemia-symptomatic fatigue hemoglobin 9-10--likely multifactorial-iron  malabsorption/celiac disease/CKD-stage MAY 2024- Bone marrow biopsy/aspiration- The bone marrow is generally normocellular for age with trilineage  hematopoiesis and nonspecific changes, likely secondary in nature in  this setting.  There is no morphologic evidence of a lymphoproliferative  process or plasma cell neoplasm.   # Anemia: hb 9.4 overall stable/- improving- JAN 2025-ferritin- > 300-  -iron  saturation- 128.-HOLD Retacrit   HOLD venofer  today.   # 09/07/2023: Stage II (cT3, cN1, cM0, p16+)  -currently s/p definitive chemoradiation with CarboTaxol weekly [[facility]] -clinical response noted.   02/09/2024- -Overall uptake in right palatine tonsil and right level 2 node has resolved. There is no definite evidence of metabolically active disease at the present time. -No new hypermetabolic lesions to suggest distant metastatic disease. Defre to [facility].   # chronic urinary obstruction- s/p SPC-Ckd stage III [nephro, Dr.Bhutani [place]]-diabetes- GFR- 30-  stable  # Diabetes [[facility] Hb A1c- 7.4]- continue follow-up with endocrinology/monitoring of blood sugars. stable  # DISPOSITION: # NO Venofer  today; NO retacrit  # follow up in 2  months-; MD;  labs- cbc/bmp;iron  studies;ferritin- ;possible venofer  or retacrit -  -Dr.B  All questions were answered. The patient knows to call the clinic with any problems, questions or concerns.     Cindy JONELLE Joe, MD 07/13/2024 10:20 AM

## 2024-07-17 NOTE — Anesthesia Preprocedure Evaluation (Addendum)
 Anesthesia Evaluation  Patient identified by MRN, date of birth, ID band Patient awake    Reviewed: Allergy & Precautions, H&P , NPO status , Patient's Chart, lab work & pertinent test results  History of Anesthesia Complications Negative for: history of anesthetic complications  Airway Mallampati: II  TM Distance: >3 FB Neck ROM: Full    Dental no notable dental hx. (+) Teeth Intact, Dental Advisory Given   Pulmonary sleep apnea and Continuous Positive Airway Pressure Ventilation , former smoker   Pulmonary exam normal breath sounds clear to auscultation       Cardiovascular Exercise Tolerance: Good hypertension, Pt. on medications (-) angina (-) Past MI Normal cardiovascular exam Rhythm:Regular Rate:Normal     Neuro/Psych negative neurological ROS  negative psych ROS   GI/Hepatic negative GI ROS, Neg liver ROS,GERD  Medicated and Controlled,,  Endo/Other  diabetes, Type 2, Insulin  Dependent    Renal/GU Renal diseasenegative Renal ROS  negative genitourinary   Musculoskeletal  (+) Arthritis ,    Abdominal   Peds  Hematology  (+) Blood dyscrasia, anemia Lab Results      Component                Value               Date                      WBC                      4.1                 07/13/2024                HGB                      9.4 (L)             07/13/2024                HCT                      29.9 (L)            07/13/2024                MCV                      97.4                07/13/2024                PLT                      158                 07/13/2024              Anesthesia Other Findings All: codeine  Reproductive/Obstetrics negative OB ROS                              Anesthesia Physical Anesthesia Plan  ASA: 3  Anesthesia Plan: MAC and Regional   Post-op Pain Management: Minimal or no pain anticipated   Induction: Intravenous  PONV Risk Score and  Plan: 2 and Midazolam , Ondansetron  and Propofol  infusion  Airway Management Planned: Natural Airway and Simple Face Mask  Additional Equipment: None  Intra-op Plan:   Post-operative Plan:   Informed Consent: I have reviewed the patients History and Physical, chart, labs and discussed the procedure including the risks, benefits and alternatives for the proposed anesthesia with the patient or authorized representative who has indicated his/her understanding and acceptance.       Plan Discussed with: CRNA and Surgeon  Anesthesia Plan Comments:          Anesthesia Quick Evaluation

## 2024-07-18 ENCOUNTER — Encounter (HOSPITAL_BASED_OUTPATIENT_CLINIC_OR_DEPARTMENT_OTHER): Payer: Self-pay | Admitting: Orthopedic Surgery

## 2024-07-18 ENCOUNTER — Ambulatory Visit (HOSPITAL_BASED_OUTPATIENT_CLINIC_OR_DEPARTMENT_OTHER): Payer: Self-pay | Admitting: Anesthesiology

## 2024-07-18 ENCOUNTER — Ambulatory Visit (HOSPITAL_BASED_OUTPATIENT_CLINIC_OR_DEPARTMENT_OTHER)
Admission: RE | Admit: 2024-07-18 | Discharge: 2024-07-18 | Disposition: A | Discharge status: Home / Self Care | Attending: Orthopedic Surgery | Admitting: Orthopedic Surgery

## 2024-07-18 ENCOUNTER — Encounter (HOSPITAL_BASED_OUTPATIENT_CLINIC_OR_DEPARTMENT_OTHER)
Admission: RE | Disposition: A | Discharge status: Home / Self Care | Payer: Self-pay | Source: Home / Self Care | Attending: Orthopedic Surgery

## 2024-07-18 ENCOUNTER — Other Ambulatory Visit: Payer: Self-pay

## 2024-07-18 DIAGNOSIS — Z794 Long term (current) use of insulin: Secondary | ICD-10-CM | POA: Diagnosis not present

## 2024-07-18 DIAGNOSIS — G5602 Carpal tunnel syndrome, left upper limb: Secondary | ICD-10-CM | POA: Diagnosis present

## 2024-07-18 DIAGNOSIS — F17220 Nicotine dependence, chewing tobacco, uncomplicated: Secondary | ICD-10-CM | POA: Diagnosis not present

## 2024-07-18 DIAGNOSIS — Z7985 Long-term (current) use of injectable non-insulin antidiabetic drugs: Secondary | ICD-10-CM | POA: Diagnosis not present

## 2024-07-18 DIAGNOSIS — G5622 Lesion of ulnar nerve, left upper limb: Secondary | ICD-10-CM | POA: Diagnosis present

## 2024-07-18 DIAGNOSIS — I129 Hypertensive chronic kidney disease with stage 1 through stage 4 chronic kidney disease, or unspecified chronic kidney disease: Secondary | ICD-10-CM | POA: Insufficient documentation

## 2024-07-18 DIAGNOSIS — I10 Essential (primary) hypertension: Secondary | ICD-10-CM | POA: Diagnosis not present

## 2024-07-18 DIAGNOSIS — Z7984 Long term (current) use of oral hypoglycemic drugs: Secondary | ICD-10-CM | POA: Diagnosis not present

## 2024-07-18 DIAGNOSIS — N183 Chronic kidney disease, stage 3 unspecified: Secondary | ICD-10-CM | POA: Diagnosis not present

## 2024-07-18 DIAGNOSIS — Z87891 Personal history of nicotine dependence: Secondary | ICD-10-CM

## 2024-07-18 DIAGNOSIS — E1122 Type 2 diabetes mellitus with diabetic chronic kidney disease: Secondary | ICD-10-CM | POA: Insufficient documentation

## 2024-07-18 DIAGNOSIS — K219 Gastro-esophageal reflux disease without esophagitis: Secondary | ICD-10-CM | POA: Diagnosis not present

## 2024-07-18 DIAGNOSIS — G4730 Sleep apnea, unspecified: Secondary | ICD-10-CM | POA: Diagnosis not present

## 2024-07-18 HISTORY — DX: Sleep apnea, unspecified: G47.30

## 2024-07-18 HISTORY — DX: Other cystostomy status: Z93.59

## 2024-07-18 HISTORY — PX: CARPAL TUNNEL WITH CUBITAL TUNNEL: SHX5608

## 2024-07-18 LAB — GLUCOSE, CAPILLARY
Glucose-Capillary: 137 mg/dL — ABNORMAL HIGH (ref 70–99)
Glucose-Capillary: 127 mg/dL — ABNORMAL HIGH (ref 70–99)

## 2024-07-18 SURGERY — RELEASE, CARPAL TUNNEL AND CUBITAL TUNNEL
Anesthesia: Monitor Anesthesia Care | Site: Arm Upper | Laterality: Left

## 2024-07-18 MED ORDER — ACETAMINOPHEN 500 MG PO TABS
ORAL_TABLET | ORAL | Status: AC
  Filled 2024-07-18: qty 2

## 2024-07-18 MED ORDER — LACTATED RINGERS IV SOLN: INTRAVENOUS | Status: DC

## 2024-07-18 MED ORDER — OXYCODONE HCL 5 MG PO TABS
5.0000 mg | ORAL_TABLET | Freq: Once | ORAL | Status: AC | PRN
  Administered 2024-07-18: 5 mg via ORAL

## 2024-07-18 MED ORDER — DEXAMETHASONE SODIUM PHOSPHATE 10 MG/ML IJ SOLN
INTRAMUSCULAR | Status: AC
Start: 2024-07-18 — End: 2024-07-18
  Filled 2024-07-18: qty 1

## 2024-07-18 MED ORDER — OXYCODONE HCL 5 MG PO TABS: 5.0000 mg | ORAL_TABLET | Freq: Four times a day (QID) | ORAL | 0 refills | Status: AC | PRN

## 2024-07-18 MED ORDER — PHENYLEPHRINE HCL (PRESSORS) 10 MG/ML IV SOLN
INTRAVENOUS | Status: DC | PRN
Start: 2024-07-18 — End: 2024-07-18
  Administered 2024-07-18 (×3): 80 ug via INTRAVENOUS

## 2024-07-18 MED ORDER — OXYCODONE HCL 5 MG PO TABS
ORAL_TABLET | ORAL | Status: AC
  Filled 2024-07-18: qty 1

## 2024-07-18 MED ORDER — MIDAZOLAM HCL 2 MG/2ML IJ SOLN
INTRAMUSCULAR | Status: AC
  Filled 2024-07-18: qty 2

## 2024-07-18 MED ORDER — ONDANSETRON HCL 4 MG/2ML IJ SOLN
INTRAMUSCULAR | Status: AC
  Filled 2024-07-18: qty 2

## 2024-07-18 MED ORDER — FENTANYL CITRATE (PF) 100 MCG/2ML IJ SOLN
50.0000 ug | Freq: Once | INTRAMUSCULAR | Status: AC
  Administered 2024-07-18: 50 ug via INTRAVENOUS

## 2024-07-18 MED ORDER — ACETAMINOPHEN 10 MG/ML IV SOLN: 1000.0000 mg | Freq: Once | INTRAVENOUS | Status: DC | PRN

## 2024-07-18 MED ORDER — FENTANYL CITRATE (PF) 100 MCG/2ML IJ SOLN
INTRAMUSCULAR | Status: AC
  Filled 2024-07-18: qty 2

## 2024-07-18 MED ORDER — PROPOFOL 500 MG/50ML IV EMUL
INTRAVENOUS | Status: DC | PRN
Start: 2024-07-18 — End: 2024-07-18
  Administered 2024-07-18: 200 ug/kg/min via INTRAVENOUS

## 2024-07-18 MED ORDER — CEFAZOLIN SODIUM-DEXTROSE 2-4 GM/100ML-% IV SOLN
2.0000 g | INTRAVENOUS | Status: AC
  Administered 2024-07-18: 2 g via INTRAVENOUS

## 2024-07-18 MED ORDER — BUPIVACAINE HCL (PF) 0.25 % IJ SOLN
INTRAMUSCULAR | Status: AC
  Filled 2024-07-18: qty 30

## 2024-07-18 MED ORDER — ROPIVACAINE HCL 5 MG/ML IJ SOLN
INTRAMUSCULAR | Status: DC | PRN
  Administered 2024-07-18: 20 mL via PERINEURAL

## 2024-07-18 MED ORDER — FENTANYL CITRATE (PF) 100 MCG/2ML IJ SOLN
INTRAMUSCULAR | Status: AC
Start: 2024-07-18 — End: 2024-07-18
  Filled 2024-07-18: qty 2

## 2024-07-18 MED ORDER — CEFAZOLIN SODIUM-DEXTROSE 2-4 GM/100ML-% IV SOLN
INTRAVENOUS | Status: AC
  Filled 2024-07-18: qty 100

## 2024-07-18 MED ORDER — ONDANSETRON HCL 4 MG/2ML IJ SOLN: 4.0000 mg | Freq: Once | INTRAMUSCULAR | Status: DC | PRN

## 2024-07-18 MED ORDER — LIDOCAINE 2% (20 MG/ML) 5 ML SYRINGE
INTRAMUSCULAR | Status: AC
  Filled 2024-07-18: qty 5

## 2024-07-18 MED ORDER — OXYCODONE HCL 5 MG/5ML PO SOLN: 5.0000 mg | Freq: Once | ORAL | Status: AC | PRN

## 2024-07-18 MED ORDER — MIDAZOLAM HCL 2 MG/2ML IJ SOLN
2.0000 mg | Freq: Once | INTRAMUSCULAR | Status: AC
  Administered 2024-07-18: 2 mg via INTRAVENOUS

## 2024-07-18 MED ORDER — 0.9 % SODIUM CHLORIDE (POUR BTL) OPTIME
TOPICAL | Status: DC | PRN
  Administered 2024-07-18: 1000 mL

## 2024-07-18 MED ORDER — ACETAMINOPHEN 500 MG PO TABS
1000.0000 mg | ORAL_TABLET | Freq: Once | ORAL | Status: AC
  Administered 2024-07-18: 1000 mg via ORAL

## 2024-07-18 MED ORDER — PROPOFOL 10 MG/ML IV BOLUS
INTRAVENOUS | Status: AC
  Filled 2024-07-18: qty 20

## 2024-07-18 MED ORDER — LIDOCAINE HCL (CARDIAC) PF 100 MG/5ML IV SOSY
PREFILLED_SYRINGE | INTRAVENOUS | Status: DC | PRN
  Administered 2024-07-18: 60 mg via INTRAVENOUS

## 2024-07-18 MED ORDER — SODIUM CHLORIDE 0.9 % IV SOLN: INTRAVENOUS | Status: DC | PRN

## 2024-07-18 MED ORDER — DEXAMETHASONE SODIUM PHOSPHATE 10 MG/ML IJ SOLN
INTRAMUSCULAR | Status: DC | PRN
Start: 2024-07-18 — End: 2024-07-18
  Administered 2024-07-18: 5 mg via INTRAVENOUS

## 2024-07-18 MED ORDER — PROPOFOL 10 MG/ML IV BOLUS
INTRAVENOUS | Status: AC
Start: 2024-07-18 — End: 2024-07-18
  Filled 2024-07-18: qty 20

## 2024-07-18 MED ORDER — ONDANSETRON HCL 4 MG/2ML IJ SOLN
INTRAMUSCULAR | Status: DC | PRN
  Administered 2024-07-18: 4 mg via INTRAVENOUS

## 2024-07-18 MED ORDER — FENTANYL CITRATE (PF) 100 MCG/2ML IJ SOLN
25.0000 ug | INTRAMUSCULAR | Status: DC | PRN
  Administered 2024-07-18 (×2): 50 ug via INTRAVENOUS

## 2024-07-18 SURGICAL SUPPLY — 43 items
BLADE SURG 15 STRL LF DISP TIS (BLADE) ×1 IMPLANT
BNDG COMPR ESMARK 4X3 LF (GAUZE/BANDAGES/DRESSINGS) ×1 IMPLANT
BNDG ELASTIC 3INX 5YD STR LF (GAUZE/BANDAGES/DRESSINGS) ×1 IMPLANT
BNDG ELASTIC 4INX 5YD STR LF (GAUZE/BANDAGES/DRESSINGS) ×1 IMPLANT
BNDG GAUZE DERMACEA FLUFF 4 (GAUZE/BANDAGES/DRESSINGS) ×2 IMPLANT
BNDG PLASTER X FAST 3X3 WHT LF (CAST SUPPLIES) IMPLANT
CHLORAPREP W/TINT 26 (MISCELLANEOUS) ×1 IMPLANT
CORD BIPOLAR FORCEPS 12FT (ELECTRODE) ×1 IMPLANT
COVER BACK TABLE 60X90IN (DRAPES) ×1 IMPLANT
CUFF TOURN SGL QUICK 18X4 (TOURNIQUET CUFF) IMPLANT
CUFF TRNQT CYL 24X4X16.5-23 (TOURNIQUET CUFF) IMPLANT
DERMABOND ADVANCED .7 DNX12 (GAUZE/BANDAGES/DRESSINGS) IMPLANT
DRAPE EXTREMITY T 121X128X90 (DISPOSABLE) ×1 IMPLANT
DRAPE SURG 17X23 STRL (DRAPES) ×1 IMPLANT
GAUZE SPONGE 4X4 12PLY STRL (GAUZE/BANDAGES/DRESSINGS) ×1 IMPLANT
GAUZE XEROFORM 1X8 LF (GAUZE/BANDAGES/DRESSINGS) ×1 IMPLANT
GLOVE BIO SURGEON STRL SZ7 (GLOVE) ×1 IMPLANT
GLOVE BIOGEL PI IND STRL 7.0 (GLOVE) ×1 IMPLANT
GOWN STRL REUS W/ TWL LRG LVL3 (GOWN DISPOSABLE) ×2 IMPLANT
NDL HYPO 25X1 1.5 SAFETY (NEEDLE) IMPLANT
NEEDLE HYPO 25X1 1.5 SAFETY (NEEDLE) IMPLANT
NS IRRIG 1000ML POUR BTL (IV SOLUTION) ×1 IMPLANT
PACK BASIN DAY SURGERY FS (CUSTOM PROCEDURE TRAY) ×1 IMPLANT
PAD CAST 3X4 CTTN HI CHSV (CAST SUPPLIES) ×1 IMPLANT
PAD CAST 4YDX4 CTTN HI CHSV (CAST SUPPLIES) IMPLANT
SLEEVE SCD COMPRESS KNEE MED (STOCKING) IMPLANT
SLING ARM FOAM STRAP LRG (SOFTGOODS) IMPLANT
SPLINT FIBERGLASS 4X30 (CAST SUPPLIES) IMPLANT
STRIP CLOSURE SKIN 1/2X4 (GAUZE/BANDAGES/DRESSINGS) IMPLANT
SUCTION TUBE FRAZIER 10FR DISP (SUCTIONS) IMPLANT
SUT ETHIBOND 3-0 V-5 (SUTURE) IMPLANT
SUT ETHILON 3 0 PS 1 (SUTURE) ×1 IMPLANT
SUT ETHILON 4 0 PS 2 18 (SUTURE) ×1 IMPLANT
SUT MNCRL AB 3-0 PS2 18 (SUTURE) IMPLANT
SUT MNCRL AB 4-0 PS2 18 (SUTURE) ×1 IMPLANT
SUT VIC AB 3-0 PS2 18XBRD (SUTURE) IMPLANT
SUT VIC AB 4-0 PS2 18 (SUTURE) IMPLANT
SUT VICRYL 0 SH 27 (SUTURE) IMPLANT
SYR BULB EAR ULCER 3OZ GRN STR (SYRINGE) ×1 IMPLANT
SYR CONTROL 10ML LL (SYRINGE) IMPLANT
TOWEL GREEN STERILE FF (TOWEL DISPOSABLE) ×2 IMPLANT
TUBE CONNECTING 20X1/4 (TUBING) IMPLANT
UNDERPAD 30X36 HEAVY ABSORB (UNDERPADS AND DIAPERS) ×1 IMPLANT

## 2024-07-18 NOTE — Progress Notes (Signed)
 Assisted Dr. Richardson Landry with left, supraclavicular, ultrasound guided block. Side rails up, monitors on throughout procedure. See vital signs in flow sheet. Tolerated Procedure well.

## 2024-07-18 NOTE — H&P (Signed)
 HAND SURGERY   HPI: Patient is a 63 y.o. male who presents with left carpal and cubital tunnel syndromes that have failed conservative management.  Patient denies any changes to their medical history or new systemic symptoms today.    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   Charcot foot due to diabetes mellitus (HCC)    Chronic constipation    CKD (chronic kidney disease), stage III [facility])    nephrologist--- dr rachele;  renal bx 11-20-2021   Diverticulosis of colon    Edema of both lower extremities    GERD (gastroesophageal reflux disease)    History of adenomatous polyp of colon    HTN (hypertension)    Hyperlipidemia, mixed    Peripheral neuropathy    Peyronie's disease    Pneumonia    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 meatal stricture    Retinopathy due to secondary diabetes mellitus (HCC)    both eyes  treated with injecitons   Sleep apnea    uses CPAP nightly   Suprapubic catheter (HCC)    due to neurogenic bladder   Thoracic spondylosis without myelopathy 11/22/2016   Type 2 diabetes mellitus [facility])    endocrinologist--- dr nida   Vitamin D  deficiency    Wears hearing aid in both ears    Past Surgical History:  Procedure Laterality Date   BIOPSY N/A 09/07/2021   Procedure: BIOPSY;  Surgeon: jinny Carmine, MD;  Location: [facility];  Service: Endoscopy;  Laterality: N/A;   BOTOX  INJECTION N/A 01/19/2024   Procedure: BOTOX  INJECTION;  Surgeon: Elisabeth Valli BIRCH, MD;  Location: [facility] ORS;  Service: Urology;  Laterality: N/A;   CATARACT EXTRACTION W/ INTRAOCULAR LENS IMPLANT Bilateral 2023   COLONOSCOPY N/A  05/03/2014   Procedure: COLONOSCOPY;  Surgeon: Margo LITTIE Haddock, MD;  Location: [facility];  Service: Endoscopy;  Laterality: N/A;  12:00   COLONOSCOPY WITH PROPOFOL  N/A 09/07/2021   Procedure: COLONOSCOPY WITH PROPOFOL ;  Surgeon: jinny Carmine, MD;  Location: [facility];  Service: Endoscopy;  Laterality: N/A;   CYSTOSCOPY N/A 01/19/2024   Procedure: CYSTOSCOPY,SUPRAPUBIC TUBE EXCHANGE;  Surgeon: Elisabeth Valli BIRCH, MD;  Location: [facility];  Service: Urology;  Laterality: N/A;  30 MINUTE CASE   CYSTOSCOPY WITH INJECTION N/A 07/26/2023   Procedure: CYSTOSCOPY WITH BOTOX  INJECTION 100 UNITS;  Surgeon: Elisabeth Valli BIRCH, MD;  Location: [facility];  Service: Urology;  Laterality: N/A;  30 minutes   CYSTOSCOPY WITH URETHRAL DILATATION N/A 01/10/2023   Procedure: CYSTOSCOPY;  Surgeon: matilda Senior, MD;  Location: [facility];  Service: Urology;  Laterality: N/A;   ESOPHAGOGASTRODUODENOSCOPY N/A 05/03/2014   Procedure: ESOPHAGOGASTRODUODENOSCOPY (EGD);  Surgeon: Margo LITTIE Haddock, MD;  [facility];  Service: Endoscopy;  Laterality: N/A;   ESOPHAGOGASTRODUODENOSCOPY (EGD) WITH PROPOFOL  N/A 06/03/2020   Procedure: ESOPHAGOGASTRODUODENOSCOPY (EGD) WITH PROPOFOL ;  Surgeon: Maryruth Ole DASEN, MD;  Location: [facility] ENDOSCOPY;  Service: Endoscopy;  Laterality: N/A;   ESOPHAGOGASTRODUODENOSCOPY (EGD) WITH PROPOFOL  N/A 09/07/2021   Procedure: ESOPHAGOGASTRODUODENOSCOPY (EGD) WITH PROPOFOL ;  Surgeon: jinny Carmine, MD;  Location: [facility];  Service: Endoscopy;  Laterality: N/A;  Diabetic  IR BONE MARROW BIOPSY & ASPIRATION  04/27/2023   MEATOTOMY N/A 01/10/2023   Procedure: MEATOTOMY ADULT;  Surgeon: Matilda Senior, MD;  Location: [facility];  Service: Urology;  Laterality: N/A;   PARS PLANA VITRECTOMY Right 03/21/2021   Procedure: PARS PLANA VITRECTOMY 25 GAUGE WITH INJECTION OF ANTIBIOTICS FOR ENDOPHTHALMITIS;  Surgeon: Tobie Baptist, MD;  Location: [facility]  OR;  Service: Ophthalmology;  Laterality: Right;   POLYPECTOMY N/A 09/07/2021   Procedure: POLYPECTOMY;  Surgeon: Jinny Carmine, MD;  Location: [facility];  Service: Endoscopy;  Laterality: N/A;   REPAIR EXTENSOR TENDON Right 08/11/2022   Procedure: EXTENSOR CARPI ULNARIS TENDON DEBRIDEMENT, POSSIBLE SUBSHEATH RECONSTRUCTION;  Surgeon: Romona Harari, MD;  Location: [facility];  Service: Orthopedics;  Laterality: Right;  or regional plus MAC   Social History   Socioeconomic History   Marital status: Married    Spouse name: [name]   Number of children: 1   Years of education: 12   Highest education level: Not on file  Occupational History   Occupation: Architect: [employer]  Tobacco Use   Smoking status: Former    Current packs/day: 0.00    Types: Cigarettes    Quit date: 1985    Years since quitting: 40.6   Smokeless tobacco: Current    Types: Snuff   Tobacco comments:    Dips daily  Vaping Use   Vaping status: Never Used  Substance and Sexual Activity   Alcohol use: Not Currently    Comment: seldom   Drug use: Never   Sexual activity: Yes  Other Topics Concern   Not on file  Social History Narrative   Lives with wife   [place]chick[place]   Caffeine- diet Mtn Dew, 2 glasses   Works as Scientist, water quality for [employer]   Social Drivers of Corporate investment banker Strain: Low Risk  (11/23/2023)   Received from [facility]   Overall Financial Resource Strain (CARDIA)    Difficulty of Paying Living Expenses: Not hard at all  Food Insecurity: No Food Insecurity (04/13/2024)   Received from [facility]   Hunger Vital Sign    Within the past 12 months, you worried that your food would run out before you got the money to buy more.: Never true    Within the past 12 months, the food you bought just didn't last and you didn't have money to get more.: Never true  Transportation Needs: No  Transportation Needs (04/13/2024)   Received from [facility] - Transportation    Lack of Transportation (Medical): No    Lack of Transportation (Non-Medical): No  Physical Activity: Patient Declined (02/08/2023)   Received from [facility]   Exercise Vital Sign    On average, how many days per week do you engage in moderate to strenuous exercise (like a brisk walk)?: Patient declined    On average, how many minutes do you engage in exercise at this level?: Patient declined  Stress: No Stress Concern Present (02/08/2023)   Received from [facility] - Occupational Stress Questionnaire    Feeling of Stress : Not at all  Social Connections: Socially Integrated (02/08/2023)   Received from [facility]   Social Network    How would you rate your social network (family, work, friends)?: Good participation with social networks   Family History  Problem Relation Age  of Onset   Aneurysm Mother        brain   Hypertension Mother    Cancer Father    Heart disease Father    Diabetes Father    Hypertension Father    Hyperlipidemia Father    Cancer Maternal Grandmother        melanoma   Heart disease Paternal Grandfather    Colon cancer Neg Hx    - negative except otherwise stated in the family history section Allergies  Allergen Reactions   Codeine Rash   Prior to Admission medications   Medication Sig Start Date End Date Taking? Authorizing Provider  aspirin  81 MG EC tablet Take 81 mg by mouth daily.   Yes [provider]  atorvastatin  (LIPITOR) 40 MG tablet Take 40 mg by mouth at bedtime.   Yes [provider]  chlorthalidone (HYGROTON) 25 MG tablet Take 12.5 mg by mouth daily.   Yes [provider]  empagliflozin (JARDIANCE) 10 MG TABS tablet Take 10 mg by mouth daily.   Yes [provider]  furosemide (LASIX) 20 MG tablet Take 20 mg by mouth daily as needed for fluid or edema. prn   Yes  [provider]  gabapentin (NEURONTIN) 300 MG capsule Take 1,500 capsules by mouth in the morning and at bedtime. 11/25/23  Yes [provider]  insulin  glargine (LANTUS  SOLOSTAR) 100 UNIT/ML Solostar Pen Inject 20 Units into the skin 2 (two) times daily. 11/07/23  Yes Johnson, Clanford L, MD  lisinopril  (ZESTRIL ) 20 MG tablet Take 20 mg by mouth daily.   Yes [provider]  pantoprazole  (PROTONIX ) 40 MG tablet Take 40 mg by mouth daily. 11/23/21  Yes [provider]  sodium bicarbonate  650 MG tablet Take 650 mg by mouth 3 (three) times daily.   Yes [provider]  traMADol  (ULTRAM ) 50 MG tablet Take 1 tablet by mouth every 6 (six) hours as needed.   Yes [provider]  feeding supplement (ENSURE ENLIVE / ENSURE PLUS) LIQD Take 237 mLs by mouth 3 (three) times daily between meals. 11/14/23   Evonnie Lenis, MD  tirzepatide  (MOUNJARO ) 15 MG/0.5ML Pen Inject 15 mg into the skin once a week. 02/21/24      No results found. - Positive ROS: All other systems have been reviewed and were otherwise negative with the exception of those mentioned in the HPI and as above.  Physical Exam: General: No acute distress, resting comfortably Cardiovascular: BUE warm and well perfused, normal rate Respiratory: Normal WOB on RA Skin: Warm and dry Neurologic: Sensation intact distally Psychiatric: Patient is at baseline mood and affect  Left Upper Extremity  No swelling or discoloration.  Positive Tinel sign over palm and medial elbow.  Full AROM of elbow, forearm, wrist, and fingers. SILT m/u/r distributions.  Fingers pink and well perfused.   Assessment: 63 yo M w/ left carpal and cubital tunnel syndromes that have failed conservative management.   Plan: OR today for left carpal tunnel release and cubital tunnel release with likely anterior transposition.  We again reviewed the risks of surgery which include bleeding, infection, damage to neurovascular  structures including median and ulnar nerves, persistent symptoms, stiffness, delayed wound healing, need for additional surgery.    Bebe Galla, M.D. [facility] 7:03 AM

## 2024-07-18 NOTE — Anesthesia Postprocedure Evaluation (Signed)
 Anesthesia Post Note  Patient: Brian Nielsen  Procedure(s) Performed: RELEASE, CARPAL TUNNEL AND CUBITAL TUNNEL (Left: Arm Upper)     Patient location during evaluation: PACU Anesthesia Type: Regional and General Level of consciousness: awake and alert Pain management: pain level controlled Vital Signs Assessment: post-procedure vital signs reviewed and stable Respiratory status: spontaneous breathing, nonlabored ventilation, respiratory function stable and patient connected to nasal cannula oxygen Cardiovascular status: blood pressure returned to baseline and stable Postop Assessment: no apparent nausea or vomiting Anesthetic complications: no   No notable events documented.  Last Vitals:  Vitals:   07/18/24 1030 07/18/24 1040  BP: 123/69 129/68  Pulse: (!) 58 60  Resp: 17 16  Temp:  (!) 36.3 C  SpO2: 96% 96%    Last Pain:  Vitals:   07/18/24 1052  TempSrc:   PainSc: 7                  Garnette DELENA Gab

## 2024-07-18 NOTE — Progress Notes (Signed)
 Last time received oxycodone  written on dc paperwork.

## 2024-07-18 NOTE — Interval H&P Note (Signed)
 History and Physical Interval Note:  07/18/2024 8:23 AM  Brian Nielsen  has presented today for surgery, with the diagnosis of Left carpal and cubital tunnel syndromes.  The various methods of treatment have been discussed with the patient and family. After consideration of risks, benefits and other options for treatment, the patient has consented to  Procedure(s) with comments: RELEASE, CARPAL TUNNEL AND CUBITAL TUNNEL (Left) - Left carpal tunnel release, left cubital tunnel release with anterior transposition as a surgical intervention.  The patient's history has been reviewed, patient examined, no change in status, stable for surgery.  I have reviewed the patient's chart and labs.  Questions were answered to the patient's satisfaction.     Carlette Palmatier

## 2024-07-18 NOTE — Anesthesia Procedure Notes (Signed)
 Procedure Name: LMA Insertion Date/Time: 07/18/2024 8:44 AM  Performed by: Burnard Rosaline HERO, CRNAPre-anesthesia Checklist: Patient identified, Emergency Drugs available, Suction available and Patient being monitored Patient Re-evaluated:Patient Re-evaluated prior to induction Oxygen Delivery Method: Circle system utilized Preoxygenation: Pre-oxygenation with 100% oxygen Induction Type: IV induction Ventilation: Mask ventilation without difficulty LMA: LMA inserted LMA Size: 4.0 Number of attempts: 1 Airway Equipment and Method: Bite block Placement Confirmation: positive ETCO2, breath sounds checked- equal and bilateral and CO2 detector Tube secured with: Tape Dental Injury: Teeth and Oropharynx as per pre-operative assessment

## 2024-07-18 NOTE — Discharge Instructions (Addendum)
 May take Tylenol  after 1 pm, if needed.    Post Anesthesia Home Care Instructions  Activity: Get plenty of rest for the remainder of the day. A responsible individual must stay with you for 24 hours following the procedure.  For the next 24 hours, DO NOT: -Drive a car -Advertising copywriter -Drink alcoholic beverages -Take any medication unless instructed by your physician -Make any legal decisions or sign important papers.  Meals: Start with liquid foods such as gelatin or soup. Progress to regular foods as tolerated. Avoid greasy, spicy, heavy foods. If nausea and/or vomiting occur, drink only clear liquids until the nausea and/or vomiting subsides. Call your physician if vomiting continues.  Special Instructions/Symptoms: Your throat may feel dry or sore from the anesthesia or the breathing tube placed in your throat during surgery. If this causes discomfort, gargle with warm salt water . The discomfort should disappear within 24 hours.  If you had a scopolamine patch placed behind your ear for the management of post- operative nausea and/or vomiting:  1. The medication in the patch is effective for 72 hours, after which it should be removed.  Wrap patch in a tissue and discard in the trash. Wash hands thoroughly with soap and water . 2. You may remove the patch earlier than 72 hours if you experience unpleasant side effects which may include dry mouth, dizziness or visual disturbances. 3. Avoid touching the patch. Wash your hands with soap and water  after contact with the patch.        Carlin Galla, M.D. Hand Surgery  POST-OPERATIVE DISCHARGE INSTRUCTIONS   PRESCRIPTIONS: You may have been given a prescription to be taken as directed for post-operative pain control.  You may also take over the counter ibuprofen/aleve and tylenol  for pain. Take this as directed on the packaging. Do not exceed 3000 mg tylenol /acetaminophen  in 24 hours.  Ibuprofen 600-800 mg (3-4) tablets by  mouth every 6 hours as needed for pain.  OR Aleve 2 tablets by mouth every 12 hours (twice daily) as needed for pain.  AND/OR Tylenol  1000 mg (2 tablets) every 8 hours as needed for pain.  Please use your pain medication carefully, as refills are limited and you may not be provided with one.  As stated above, please use over the counter pain medicine - it will also be helpful with decreasing your swelling.    ANESTHESIA: After your surgery, post-surgical discomfort or pain is likely. This discomfort can last several days to a few weeks. At certain times of the day your discomfort may be more intense.   Did you receive a nerve block?  A nerve block can provide pain relief for one hour to two days after your surgery. As long as the nerve block is working, you will experience little or no sensation in the area the surgeon operated on.  As the nerve block wears off, you will begin to experience pain or discomfort. It is very important that you begin taking your prescribed pain medication before the nerve block fully wears off. Treating your pain at the first sign of the block wearing off will ensure your pain is better controlled and more tolerable when full-sensation returns. Do not wait until the pain is intolerable, as the medicine will be less effective. It is better to treat pain in advance than to try and catch up.   General Anesthesia:  If you did not receive a nerve block during your surgery, you will need to start taking your pain medication shortly after  your surgery and should continue to do so as prescribed by your surgeon.     ICE AND ELEVATION: You may use ice for the first 48-72 hours, but it is not critical.   Motion of your fingers is very important to decrease the swelling.  Elevation, as much as possible for the next 48 hours, is critical for decreasing swelling as well as for pain relief. Elevation means when you are seated or lying down, you hand should be at or above your  heart. When walking, the hand needs to be at or above the level of your elbow.  If the bandage gets too tight, it may need to be loosened. Please contact our office and we will instruct you in how to do this.    SURGICAL BANDAGES:  Keep your dressing and/or splint clean and dry at all times.  Do not remove until you are seen again in the office.  If careful, you may place a plastic bag over your bandage and tape the end to shower, but be careful, do not get your bandages wet.     HAND THERAPY:  You will be contacted to set up your first therapy appointment.    ACTIVITY AND WORK: You are encouraged to move any fingers which are not in the bandage.  Light use of the fingers is allowed to assist the other hand with daily hygiene and eating, but strong gripping or lifting is often uncomfortable and should be avoided.  You might miss a variable period of time from work and hopefully this issue has been discussed prior to surgery. You may not do any heavy work with your affected hand for about 2 weeks.    [facility] Second Floor, [address] [place], [place] [address] [phone]   Regional Anesthesia Blocks  1. You may not be able to move or feel the blocked extremity after a regional anesthetic block. This may last may last from 3-48 hours after placement, but it will go away. The length of time depends on the medication injected and your individual response to the medication. As the nerves start to wake up, you may experience tingling as the movement and feeling returns to your extremity. If the numbness and inability to move your extremity has not gone away after 48 hours, please call your surgeon.   2. The extremity that is blocked will need to be protected until the numbness is gone and the strength has returned. Because you cannot feel it, you will need to take extra care to avoid injury. Because it may be weak, you may have difficulty moving it or using it. You may not  know what position it is in without looking at it while the block is in effect.  3. For blocks in the legs and feet, returning to weight bearing and walking needs to be done carefully. You will need to wait until the numbness is entirely gone and the strength has returned. You should be able to move your leg and foot normally before you try and bear weight or walk. You will need someone to be with you when you first try to ensure you do not fall and possibly risk injury.  4. Bruising and tenderness at the needle site are common side effects and will resolve in a few days.  5. Persistent numbness or new problems with movement should be communicated to the surgeon or the [facility] [phone] [facility] [phone]).

## 2024-07-18 NOTE — Anesthesia Procedure Notes (Signed)
 Anesthesia Regional Block: Supraclavicular block   Pre-Anesthetic Checklist: , timeout performed,  Correct Patient, Correct Site, Correct Laterality,  Correct Procedure, Correct Position, site marked,  Risks and benefits discussed,  Surgical consent,  Pre-op evaluation,  At surgeon's request and post-op pain management  Laterality: Upper and Left  Prep: chloraprep       Needles:  Injection technique: Single-shot  Needle Type: Echogenic Needle     Needle Length: 5cm  Needle Gauge: 21     Additional Needles:   Procedures:,,,, ultrasound used (permanent image in chart),,     Nerve Stimulator or Paresthesia:   Additional Responses:  Block tested.  Patient tolerated procedure well Narrative:  Start time: 07/18/2024 8:05 AM End time: 07/18/2024 8:11 AM Injection made incrementally with aspirations every 5 mL.  Performed by: Personally  Anesthesiologist: Jefm Garnette LABOR, MD  Additional Notes: Block tested. Patient tolerated procedure well.

## 2024-07-18 NOTE — Op Note (Signed)
 Date of Surgery: 07/18/2024  INDICATIONS: Patient is a 63 y.o.-year-old male with left carpal and cubital tunnel syndromes both of which have failed conservative management.  Risks, benefits, and alternatives to surgery were again discussed with the patient in the preoperative area. The patient wishes to proceed with surgery.  Informed consent was signed after our discussion.   PREOPERATIVE DIAGNOSIS:  1. Left carpal tunnel syndrome 2. Left cubital tunnel syndrome   POSTOPERATIVE DIAGNOSIS: Same.  PROCEDURE:  Left carpal tunnel release Left cubital tunnel release with anterior transpositoin Left flexor-pronator tendon lengthening with z-plasty   SURGEON: Carlin Galla, M.D.  ASSIST:   ANESTHESIA:  general, regional  IV FLUIDS AND URINE: See anesthesia.  ESTIMATED BLOOD LOSS: 15 mL.  IMPLANTS: * No implants in log *   DRAINS: None  COMPLICATIONS: None noted  DESCRIPTION OF PROCEDURE: The patient was met in the preoperative holding area where the surgical site was marked and the consent form was signed.  The patient was then taken to the operating room and remained on the stretcher.  All bony prominences were well padded.  A hand table was placed adjacent to the left upper extremity and locked into place. A tourniquet was applied to the left upper arm.  General endotracheal anesthesia was induced.  The operative extremity was prepped and draped in the usual and sterile fashion.  A formal time-out was performed to confirm that this was the correct patient, surgery, side, and site.   Following timeout, the limb was exsanguinated and the tourniquet inflated to 250 mmHg.  A longitudinal incision was made in line with the radial border of the ring finger from distal to the wrist flexion crease to the intersection of Kaplan's cardinal line.  The skin and subcutaneous tissue was sharply divided.  The longitudinally running palmar fascia was incised.  The thenar musculature was bluntly  swept off of the transverse carpal ligament.  The ligament was divided from proximal to distal until the fat surrounding the palmar arch was encountered. Retractors were then placed in the proximal aspect of the wound to visualize the distal antebrachial fascia.  The fascia was sharply divided under direct visualization.   The wound was then thoroughly irrigated with sterile saline.  The tourniquet was deflated. The wound was then closed with 4-0 nylon sutures in a horizontal mattress fashion. The wound was then dressed with xeroform, folded kerlix.  I then turned my attention to the medial elbow.  A longitudinal incision was made at the posterior aspect of the medial epicondyle extending both proximally and distally in line with the ulnar nerve.  The skin was incised.  Blunt dissection was used through the adipose tissue.  Small crossing vessels were coagulated with bipolar cautery.  The normal crossing point of the medial antebrachial cutaneous nerve was marked approximately 3 and half centimeters distal to the medial condyle.  Further blunt dissection was used to identify the ulnar nerve just posterior and proximal to the medial epicondyle.  The ulnar nerve was decompressed distally.  Osborne's ligament was released.  The fascia overlying the flexor carpi ulnaris was sharply divided.  Care was taken in the area of the medial antebrachial cutaneous nerve.  The humeral and ulnar heads of the FCU muscle belly were identified.  Blunt dissection was used to divide the muscle bellies to further decompress the ulnar nerve.  The nerve was then decompressed proximally.  The fascia overlying the ulnar nerve was divided with care taken to protect the adjacent venous  plexus.  The nerve was decompressed proximally to the level of the arcade of Struthers.  The medial intermuscular septum was excised taking care to protect the adjacent venous plexus.  The nerve was mobilized circumferentially to allow for a loose anterior  transposition without constriction or tension.  At this point,  flaps were designed over the fascia of the flexor pronator mass.  A Z-plasty of the flexor pronator tendon/fascia was performed taking care that the flaps were as long and wide as possible to avoid secondary compression with the sling.  The flaps were carefully elevated.  The ulnar nerve was then transposed anteriorly.  The 2 fascial flaps were then sutured together using a 3-0 Ethibond.  The fascial sling was very loose so as to not cause secondary compression of the nerve.  Passive flexion and extension of the elbow was performed and the nerve was found to have a straight path in full extension without compression, kinking, or tension.  The nerve was quite loose in flexion without any secondary constriction.  Following transposition, the wound was thoroughly irrigated with copious sterile saline.  The tourniquet was deflated.  Hemostasis was achieved with both direct pressure over the wound and bipolar electrocautery.  The wound was then closed in layered fashion using a 3-0 Monocryl in a buried interrupted fashion followed by 4-0 nylon suture in horizontal mattress fashion.  The extremity was dressed with xeroform, bulky folded kerlix, cast padding, and a well padded long arm splint.   The patient was reversed from anesthesia and extubated uneventfully.  They were transferred from the operating table to the postoperative bed.  All counts were correct x 2 at the end of the procedure.  The patient was then taken to the PACU in stable condition.   POSTOPERATIVE PLAN: He will be discharged to home with appropriate pain medication and discharge instructions.  A referral will be placed to hand therapy.  I'll see him in the office in 10-14 days for his first postop visit.   Carlin Galla, MD 10:10 AM

## 2024-07-18 NOTE — Transfer of Care (Signed)
 Immediate Anesthesia Transfer of Care Note  Patient: Brian Nielsen  Procedure(s) Performed: RELEASE, CARPAL TUNNEL AND CUBITAL TUNNEL (Left: Arm Upper)  Patient Location: PACU  Anesthesia Type:General and Regional  Level of Consciousness: awake, alert , and patient cooperative  Airway & Oxygen Therapy: Patient Spontanous Breathing and Patient connected to face mask oxygen  Post-op Assessment: Report given to RN and Post -op Vital signs reviewed and stable  Post vital signs: Reviewed and stable  Last Vitals:  Vitals Value Taken Time  BP 117/62 07/18/24 10:07  Temp 36.6 C 07/18/24 10:08  Pulse 56 07/18/24 10:09  Resp 15 07/18/24 10:09  SpO2 100 % 07/18/24 10:09  Vitals shown include unfiled device data.  Last Pain:  Vitals:   07/18/24 0658  TempSrc: Oral  PainSc: 0-No pain      Patients Stated Pain Goal: 1 (07/18/24 9341)  Complications: No notable events documented.

## 2024-07-19 ENCOUNTER — Encounter (HOSPITAL_BASED_OUTPATIENT_CLINIC_OR_DEPARTMENT_OTHER): Payer: Self-pay | Admitting: Orthopedic Surgery

## 2024-08-24 ENCOUNTER — Encounter (HOSPITAL_BASED_OUTPATIENT_CLINIC_OR_DEPARTMENT_OTHER): Payer: Self-pay | Admitting: Pulmonary Disease

## 2024-08-24 ENCOUNTER — Ambulatory Visit (INDEPENDENT_AMBULATORY_CARE_PROVIDER_SITE_OTHER): Admitting: Pulmonary Disease

## 2024-08-24 VITALS — BP 154/76 | HR 61 | Ht 68.0 in | Wt 180.0 lb

## 2024-08-24 DIAGNOSIS — N1831 Chronic kidney disease, stage 3a: Secondary | ICD-10-CM | POA: Diagnosis not present

## 2024-08-24 DIAGNOSIS — G4733 Obstructive sleep apnea (adult) (pediatric): Secondary | ICD-10-CM

## 2024-08-24 NOTE — Patient Instructions (Signed)
  VISIT SUMMARY: Today, we discussed your ongoing issues with CPAP use for obstructive sleep apnea, chronic dryness in your mouth and throat, blood pressure management, and your overall health. We reviewed your current symptoms, treatment options, and next steps for each condition.  YOUR PLAN: -OBSTRUCTIVE SLEEP APNEA: Obstructive sleep apnea is a condition where your airway becomes blocked during sleep, causing breathing pauses. Your CPAP therapy is not providing relief due to discomfort and dryness. We discussed the possibility of a hypoglossal nerve stimulator implant as an alternative. You will need a drug-induced sleep endoscopy to assess your airway and determine if you are a candidate for the implant. We also talked about a dental appliance as another option.  -CHRONIC OROPHARYNGEAL DRYNESS AND ORAL CANDIDIASIS: Your chronic dryness in the mouth and throat is likely worsened by CPAP use and previous radiation therapy. You also have oral candidiasis, a fungal infection in the mouth, which may be related to the dryness and CPAP use.  -END STAGE RENAL DISEASE WITH CHRONIC INDWELLING BLADDER CATHETER: End stage renal disease is the final stage of chronic kidney disease where the kidneys no longer function properly. You have a chronic indwelling bladder catheter, which increases your risk of infections. No current signs of infection were noted today.  -HYPERTENSION WITH LABILE BLOOD PRESSURE: Hypertension is high blood pressure, and labile blood pressure means it fluctuates significantly. Your blood pressure varies from high to low, causing symptoms when it drops too much. We will continue to monitor and manage your blood pressure with medication.  -TYPE 2 DIABETES MELLITUS: Type 2 diabetes is a condition where your body does not use insulin  properly, leading to high blood sugar levels. Your diabetes is well-controlled with Mounjaro , but you should stop taking it one week before your planned  endoscopy.  INSTRUCTIONS: 1. Schedule a drug-induced sleep endoscopy to assess your airway collapse pattern for the hypoglossal nerve stimulator implant. 2. Reviewed your sleep study data to confirm CPAP usage and effectiveness. 3. Continue taking your blood pressure medication and monitor your blood pressure closely. 4. Continue using Mounjaro  for diabetes management, but discontinue it one week before the planned endoscopy. 5. Follow up with a surgeon if the endoscopy results are favorable for the hypoglossal nerve stimulator implant.                      Contains text generated by [employer].                                 Contains text generated by [employer].

## 2024-08-24 NOTE — Progress Notes (Signed)
 Subjective:    Patient ID: Brian Nielsen, male    DOB: 01/21/1961, 63 y.o.   MRN: [id number]   63 yo for follow-up of OSA     PMH - GERD  Hypertension Diabetes for 20 years CKD3 Suprapubic catheter for neurogenic bladder  throat cancer 07/2023   -hospitalized 10/2023 with confusion, altered mental status found to have aspiration pneumonia AKI and Klebsiella UTI with fractured ribs from the fall.   Discussed the use of AI scribe software for clinical note transcription with the patient, who gave verbal consent to proceed.  History of Present Illness Brian Nielsen is a 63 year old male with obstructive sleep apnea who presents with issues related to CPAP use and blood pressure management.  He experiences dryness in his mouth and throat, particularly in the mornings, and discomfort with the CPAP mask. He often wakes up needing to scrape his tongue and teeth due to dryness. He uses the CPAP machine for about four to five hours per night but often removes it due to discomfort and dryness.  He has undergone twenty sessions of radiation therapy, which he believes contributes to his current symptoms. He is currently under the care of a cancer doctor and is awaiting results from recent blood tests.  He is under strict blood pressure management. His blood pressure was 164 mmHg in the morning before taking his medication, which brought it down to 154 mmHg. Previously, his blood pressure was as high as 212/90 mmHg. He notes that his blood pressure can drop to 108/60 mmHg, at which point he feels nonfunctional. He monitors his blood pressure closely and adjusts his activities accordingly. Patient states his blood pressure is higher in the mornings after using CPAP and, because of that, patient's nephrologist mentioned patient may try and Inspire implant. Patients interested in moving appointment up to discuss Inspire if possible.    Significant tests/ events reviewed   HST 02/2023 showed moderate  OSA with AHI 16/ hr, low sat 70% (Snap)  CPAP titration 04/2023 >> 14 cm   Review of Systems  neg for any significant sore throat, dysphagia, itching, sneezing, nasal congestion or excess/ purulent secretions, fever, chills, sweats, unintended wt loss, pleuritic or exertional cp, hempoptysis, orthopnea pnd or change in chronic leg swelling. Also denies presyncope, palpitations, heartburn, abdominal pain, nausea, vomiting, diarrhea or change in bowel or urinary habits, dysuria,hematuria, rash, arthralgias, visual complaints, headache, numbness weakness or ataxia.      Objective:   Physical Exam  Gen. Pleasant, well-nourished, in no distress ENT - no thrush, no pallor/icterus,no post nasal drip Neck: No JVD, no thyromegaly, no carotid bruits Lungs: no use of accessory muscles, no dullness to percussion, clear without rales or rhonchi  Cardiovascular: Rhythm regular, heart sounds  normal, no murmurs or gallops, no peripheral edema Musculoskeletal: No deformities, no cyanosis or clubbing        Assessment & Plan:   Assessment and Plan Assessment & Plan Obstructive sleep apnea refractory to CPAP Obstructive sleep apnea is refractory to CPAP therapy, with significant discomfort and dryness likely due to mouth breathing and air leakage. Reviewed download data >> AHI 4/h, sporadic compliance avg 4h /night He is interested in a hypoglossal nerve stimulator implant as an alternative to CPAP. Discussed the risks of the implant, including potential infection due to the chronic indwelling catheter and previous Klebsiella infection. The implant involves surgical risks, especially considering previous radiation to the neck area. He is frustrated with CPAP and desires an  alternative solution. Discussed the need for drug-induced sleep endoscopy to assess airway collapse pattern and determine candidacy for the implant. Also discussed dental appliance as an alternative treatment option. - Discussed dental  appliance as an alternative treatment option.referral to sleep dentist if needed - If this does not work, we can Schedule drug-induced sleep endoscopy to assess airway collapse pattern. - Reviewed CPAP data to confirm CPAP usage and effectiveness. - Provided information on hypoglossal nerve stimulator implant.    Chronic oropharyngeal dryness and oral candidiasis Chronic oropharyngeal dryness likely exacerbated by CPAP use and previous radiation therapy. He reports oral candidiasis, which may be related to the dryness and use of CPAP.  CKD3 with chronic indwelling bladder catheter End stage renal disease managed with a chronic indwelling bladder catheter. He is at risk for infections due to the catheter, which is a foreign body. Previous Klebsiella infection noted, but no current signs of infection reported.  Hypertension with labile blood pressure Hypertension with labile blood pressure, with readings ranging from 108/60 mmHg to 164/xx mmHg. He experiences symptoms of hypotension at lower readings and feels better around 135/xx mmHg. Blood pressure is managed with medication, but he reports fluctuations, particularly in the morning before medication.  Type 2 diabetes mellitus Type 2 diabetes mellitus is currently well-controlled with Mounjaro , maintaining blood glucose levels between 115-120 mg/dL. - Continue Mounjaro  for diabetes management. - Discontinue Mounjaro  one week prior to the planned endoscopy.

## 2024-08-27 ENCOUNTER — Encounter (HOSPITAL_BASED_OUTPATIENT_CLINIC_OR_DEPARTMENT_OTHER): Payer: Self-pay | Admitting: Pulmonary Disease

## 2024-08-27 DIAGNOSIS — G4733 Obstructive sleep apnea (adult) (pediatric): Secondary | ICD-10-CM

## 2024-08-30 ENCOUNTER — Ambulatory Visit (HOSPITAL_BASED_OUTPATIENT_CLINIC_OR_DEPARTMENT_OTHER): Admitting: Pulmonary Disease

## 2024-09-13 ENCOUNTER — Ambulatory Visit

## 2024-09-13 ENCOUNTER — Inpatient Hospital Stay

## 2024-09-13 ENCOUNTER — Other Ambulatory Visit

## 2024-09-13 ENCOUNTER — Inpatient Hospital Stay (HOSPITAL_BASED_OUTPATIENT_CLINIC_OR_DEPARTMENT_OTHER): Admitting: Nurse Practitioner

## 2024-09-13 ENCOUNTER — Ambulatory Visit: Admitting: Internal Medicine

## 2024-09-13 ENCOUNTER — Encounter: Payer: Self-pay | Admitting: Nurse Practitioner

## 2024-09-13 ENCOUNTER — Inpatient Hospital Stay: Attending: Internal Medicine

## 2024-09-13 VITALS — BP 142/76 | HR 58 | Temp 96.0°F | Resp 16 | Ht 68.0 in | Wt 176.8 lb

## 2024-09-13 DIAGNOSIS — Z85818 Personal history of malignant neoplasm of other sites of lip, oral cavity, and pharynx: Secondary | ICD-10-CM | POA: Diagnosis not present

## 2024-09-13 DIAGNOSIS — Z923 Personal history of irradiation: Secondary | ICD-10-CM | POA: Diagnosis not present

## 2024-09-13 DIAGNOSIS — N189 Chronic kidney disease, unspecified: Secondary | ICD-10-CM | POA: Insufficient documentation

## 2024-09-13 DIAGNOSIS — B370 Candidal stomatitis: Secondary | ICD-10-CM

## 2024-09-13 DIAGNOSIS — E1122 Type 2 diabetes mellitus with diabetic chronic kidney disease: Secondary | ICD-10-CM | POA: Insufficient documentation

## 2024-09-13 DIAGNOSIS — N139 Obstructive and reflux uropathy, unspecified: Secondary | ICD-10-CM | POA: Insufficient documentation

## 2024-09-13 DIAGNOSIS — Z9221 Personal history of antineoplastic chemotherapy: Secondary | ICD-10-CM | POA: Insufficient documentation

## 2024-09-13 DIAGNOSIS — B379 Candidiasis, unspecified: Secondary | ICD-10-CM | POA: Diagnosis not present

## 2024-09-13 DIAGNOSIS — N1832 Chronic kidney disease, stage 3b: Secondary | ICD-10-CM | POA: Diagnosis not present

## 2024-09-13 DIAGNOSIS — C109 Malignant neoplasm of oropharynx, unspecified: Secondary | ICD-10-CM

## 2024-09-13 DIAGNOSIS — D631 Anemia in chronic kidney disease: Secondary | ICD-10-CM

## 2024-09-13 DIAGNOSIS — D649 Anemia, unspecified: Secondary | ICD-10-CM | POA: Insufficient documentation

## 2024-09-13 LAB — CBC WITH DIFFERENTIAL (CANCER CENTER ONLY)
Abs Immature Granulocytes: 0.02 K/uL (ref 0.00–0.07)
Basophils Absolute: 0 K/uL (ref 0.0–0.1)
Basophils Relative: 0 %
Eosinophils Absolute: 0.1 K/uL (ref 0.0–0.5)
Eosinophils Relative: 4 %
HCT: 29.7 % — ABNORMAL LOW (ref 39.0–52.0)
Hemoglobin: 9.5 g/dL — ABNORMAL LOW (ref 13.0–17.0)
Immature Granulocytes: 1 %
Lymphocytes Relative: 13 %
Lymphs Abs: 0.4 K/uL — ABNORMAL LOW (ref 0.7–4.0)
MCH: 30.7 pg (ref 26.0–34.0)
MCHC: 32 g/dL (ref 30.0–36.0)
MCV: 96.1 fL (ref 80.0–100.0)
Monocytes Absolute: 0.3 K/uL (ref 0.1–1.0)
Monocytes Relative: 10 %
Neutro Abs: 2.2 K/uL (ref 1.7–7.7)
Neutrophils Relative %: 72 %
Platelet Count: 126 K/uL — ABNORMAL LOW (ref 150–400)
RBC: 3.09 MIL/uL — ABNORMAL LOW (ref 4.22–5.81)
RDW: 12.5 % (ref 11.5–15.5)
WBC Count: 3.1 K/uL — ABNORMAL LOW (ref 4.0–10.5)
nRBC: 0 % (ref 0.0–0.2)

## 2024-09-13 LAB — BASIC METABOLIC PANEL WITH GFR
Anion gap: 9 (ref 5–15)
BUN: 42 mg/dL — ABNORMAL HIGH (ref 8–23)
CO2: 21 mmol/L — ABNORMAL LOW (ref 22–32)
Calcium: 8.7 mg/dL — ABNORMAL LOW (ref 8.9–10.3)
Chloride: 108 mmol/L (ref 98–111)
Creatinine, Ser: 2.22 mg/dL — ABNORMAL HIGH (ref 0.61–1.24)
GFR, Estimated: 33 mL/min — ABNORMAL LOW (ref >60)
Glucose, Bld: 161 mg/dL — ABNORMAL HIGH (ref 70–99)
Potassium: 4.2 mmol/L (ref 3.5–5.1)
Sodium: 138 mmol/L (ref 135–145)

## 2024-09-13 LAB — IRON AND TIBC
Iron: 94 ug/dL (ref 45–182)
Saturation Ratios: 32 % (ref 17.9–39.5)
TIBC: 294 ug/dL (ref 250–450)
UIBC: 200 ug/dL

## 2024-09-13 LAB — FERRITIN: Ferritin: 371 ng/mL — ABNORMAL HIGH (ref 24–336)

## 2024-09-13 MED ORDER — EPOETIN ALFA-EPBX 20000 UNIT/ML IJ SOLN
20000.0000 [IU] | Freq: Once | INTRAMUSCULAR | Status: AC
  Administered 2024-09-13: 20000 [IU] via SUBCUTANEOUS
  Filled 2024-09-13: qty 1

## 2024-09-13 MED ORDER — FLUCONAZOLE 100 MG PO TABS: ORAL_TABLET | ORAL | 0 refills | Status: AC

## 2024-09-13 NOTE — Progress Notes (Signed)
 [facility] CONSULT NOTE  Patient Care Team: Adine Duwaine MATSU, FNP as PCP - General (Family Medicine) Verlin Lonni BIRCH, MD as PCP - Cardiology (Cardiology) Orman Maize, NP (Inactive) as Nurse Practitioner (Endocrinology) Rennie Cindy SAUNDERS, MD as Consulting Physician (Oncology)  CHIEF COMPLAINTS/PURPOSE OF CONSULTATION: ANEMIA  HEMATOLOGY HISTORY:  # ANEMIA PROGRESSIVE since 2020 12; 2022- 10 MCV- 90s; colonoscopy 2015; multiple endoscopies-last September 2021-biopsy active celiac disease; MAY 2024- Bone marrow biopsy/aspiration- The bone marrow is generally normocellular for age with trilineage  hematopoiesis and nonspecific changes, likely secondary in nature in  this setting.  There is no morphologic evidence of a lymphoproliferative  process or plasma cell neoplasm.   #CKD stage III [[facility]]/diabetes [[facility]]; ? Celiac disease [[facility]];   - Labs: 07/2014 - tTG IgA 101 03/2020 - positive endomysial Ab IgA, tTG IgA 78 - CSY: 07/2014 - normal appearing terminal ileum, sigmoid diverticulosis, and non-bleeding internal hemorrhoids - EGD: 07/2014 - mild non-erosive gastritis, normal appearing duodenal mucosa with duodenal biopsies showing celiac sprue - EGD: 06/03/2020 - procedure aborted due to presence of food - EGD: 08/12/2020 - irregular Z-line found at [facility] with bx showing mild reflux esophagitis, 1 cm hiatal hernia, mild chronic gastritis, decreased folds found in entire duodenum, flattening found in entire duodenum and scalloped mucosa with bx showing active Celiac disease with villous atrophy, increased intraepithelial lymphocytes, etc. Repeat in 1-year.  HISTORY OF PRESENTING ILLNESS:  with wife.  Ambulating independently.  Brian Nielsen 63 y.o. male with symptomatic anemia, likely secondary to chronic kidney disease, chronic urinary obstruction status post SPC, IDA, poorly controlled diabetes and complications of CKD, peripheral neuropathy,  and history of p16 positive tonsillar cancer status post definitive chemoradiation at [facility], who returns to clinic for follow-up of his anemia.  He is not currently using cigarettes or smokeless tobacco.  In the interim, he underwent carpal tunnel release surgery on the left.  Previously had it on the right.  He completed radiation for his cancer in December 2024.  He is unsure if his insurance is going to cover him to continue his care at [facility].  He was diagnosed with esophageal thrush.  Somewhat improved with nystatin but ongoing symptoms.  Previously had Diflucan  which helped.  Review of Systems  Constitutional:  Positive for malaise/fatigue. Negative for chills, diaphoresis and fever.  HENT:  Positive for sore throat. Negative for nosebleeds.   Eyes:  Negative for double vision.  Respiratory:  Negative for cough, hemoptysis, sputum production and wheezing.   Cardiovascular:  Negative for chest pain, palpitations, orthopnea and leg swelling.  Gastrointestinal:  Negative for blood in stool, heartburn, melena, nausea and vomiting.  Genitourinary:  Negative for dysuria, frequency and urgency.  Musculoskeletal:  Positive for back pain and joint pain.  Skin: Negative.  Negative for itching and rash.  Neurological:  Positive for weakness. Negative for dizziness, tingling, focal weakness and headaches.  Endo/Heme/Allergies:  Does not bruise/bleed easily.  Psychiatric/Behavioral:  Negative for depression. The patient is not nervous/anxious and does not have insomnia.    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  Charcot foot due to  diabetes mellitus (HCC)    Chronic constipation    CKD (chronic kidney disease), stage III [facility])    nephrologist--- dr rachele;  renal bx 11-20-2021   Diverticulosis of colon    Edema of both lower extremities    GERD (gastroesophageal reflux disease)    History of adenomatous polyp of colon    HTN (hypertension)    Hyperlipidemia, mixed    Peripheral neuropathy    Peyronie's disease    Pneumonia    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 meatal stricture    Retinopathy due to secondary diabetes mellitus (HCC)    both eyes  treated with injecitons   Sleep apnea    uses CPAP nightly   Suprapubic catheter (HCC)    due to neurogenic bladder   Thoracic spondylosis without myelopathy 11/22/2016   Type 2 diabetes mellitus [facility])    endocrinologist--- dr nida   Vitamin D  deficiency    Wears hearing aid in both ears     SURGICAL HISTORY: Past Surgical History:  Procedure Laterality Date   BIOPSY N/A 09/07/2021   Procedure: BIOPSY;  Surgeon: Jinny Carmine, MD;  Location: [facility];  Service: Endoscopy;  Laterality: N/A;   BOTOX  INJECTION N/A 01/19/2024   Procedure: BOTOX  INJECTION;  Surgeon: Elisabeth Valli BIRCH, MD;  Location: [facility];  Service: Urology;  Laterality: N/A;   CARPAL TUNNEL WITH CUBITAL TUNNEL Left 07/18/2024   Procedure: RELEASE, CARPAL TUNNEL AND CUBITAL TUNNEL;  Surgeon: Romona Harari, MD;  Location: [facility];  Service: Orthopedics;  Laterality: Left;  Left carpal tunnel release, left cubital tunnel release with anterior transposition   CATARACT EXTRACTION W/ INTRAOCULAR LENS IMPLANT Bilateral 2023   COLONOSCOPY N/A 05/03/2014   Procedure: COLONOSCOPY;  Surgeon: Margo LITTIE Haddock, MD;  [facility];  Service: Endoscopy;  Laterality: N/A;  12:00   COLONOSCOPY WITH PROPOFOL  N/A 09/07/2021   Procedure: COLONOSCOPY WITH PROPOFOL ;  Surgeon: Jinny Carmine, MD;  Location: [facility];  Service: Endoscopy;  Laterality: N/A;   CYSTOSCOPY N/A 01/19/2024    Procedure: CYSTOSCOPY,SUPRAPUBIC TUBE EXCHANGE;  Surgeon: Elisabeth Valli BIRCH, MD;  Location: [facility];  Service: Urology;  Laterality: N/A;  30 MINUTE CASE   CYSTOSCOPY WITH INJECTION N/A 07/26/2023   Procedure: CYSTOSCOPY WITH BOTOX  INJECTION 100 UNITS;  Surgeon: Elisabeth Valli BIRCH, MD;  Location: [facility];  Service: Urology;  Laterality: N/A;  30 minutes   CYSTOSCOPY WITH URETHRAL DILATATION N/A 01/10/2023   Procedure: CYSTOSCOPY;  Surgeon: Matilda Senior, MD;  Location: [facility];  Service: Urology;  Laterality: N/A;   ESOPHAGOGASTRODUODENOSCOPY N/A 05/03/2014   Procedure: ESOPHAGOGASTRODUODENOSCOPY (EGD);  Surgeon: Margo LITTIE Haddock, MD;  [facility];  Service: Endoscopy;  Laterality: N/A;   ESOPHAGOGASTRODUODENOSCOPY (EGD) WITH PROPOFOL  N/A 06/03/2020   Procedure: ESOPHAGOGASTRODUODENOSCOPY (EGD) WITH PROPOFOL ;  Surgeon: Maryruth Ole DASEN, MD;  Location: [facility];  Service: Endoscopy;  Laterality: N/A;   ESOPHAGOGASTRODUODENOSCOPY (EGD) WITH PROPOFOL  N/A 09/07/2021   Procedure: ESOPHAGOGASTRODUODENOSCOPY (EGD) WITH PROPOFOL ;  Surgeon: Jinny Carmine, MD;  Location: [facility];  Service: Endoscopy;  Laterality: N/A;  Diabetic   IR BONE MARROW BIOPSY & ASPIRATION  04/27/2023   MEATOTOMY N/A 01/10/2023   Procedure: MEATOTOMY ADULT;  Surgeon: Matilda Senior, MD;  Location: [facility];  Service: Urology;  Laterality: N/A;   PARS PLANA VITRECTOMY Right 03/21/2021   Procedure: PARS PLANA VITRECTOMY 25 GAUGE WITH INJECTION OF ANTIBIOTICS FOR ENDOPHTHALMITIS;  Surgeon: Tobie Baptist, MD;  Location:  [facility];  Service: Ophthalmology;  Laterality: Right;   POLYPECTOMY N/A 09/07/2021   Procedure: POLYPECTOMY;  Surgeon: Jinny Carmine, MD;  Location: [facility];  Service: Endoscopy;  Laterality: N/A;   REPAIR EXTENSOR TENDON Right 08/11/2022   Procedure: EXTENSOR CARPI ULNARIS TENDON DEBRIDEMENT, POSSIBLE SUBSHEATH RECONSTRUCTION;  Surgeon:  Romona Harari, MD;  Location: [facility];  Service: Orthopedics;  Laterality: Right;  or regional plus MAC    SOCIAL HISTORY: Social History   Socioeconomic History   Marital status: Married    Spouse name: [name]   Number of children: 1   Years of education: 12   Highest education level: Not on file  Occupational History   Occupation: Architect: [employer]  Tobacco Use   Smoking status: Former    Current packs/day: 0.00    Average packs/day: 0.5 packs/day for 12.0 years (6.0 ttl pk-yrs)    Types: Cigarettes    Start date: 63    Quit date: 1985    Years since quitting: 40.8   Smokeless tobacco: Current    Types: Snuff   Tobacco comments:    Dips daily  Vaping Use   Vaping status: Never Used  Substance and Sexual Activity   Alcohol use: Not Currently    Comment: seldom   Drug use: Never   Sexual activity: Yes  Other Topics Concern   Not on file  Social History Narrative   Lives with wife   [place]   Caffeine- diet Mtn Dew, 2 glasses   Works as scientist, water quality for [employer]   Social Drivers of Corporate Investment Banker Strain: Low Risk  (11/23/2023)   Received from [facility]   Overall Financial Resource Strain (CARDIA)    Difficulty of Paying Living Expenses: Not hard at all  Food Insecurity: No Food Insecurity (04/13/2024)   Received from [facility]   Hunger Vital Sign    Within the past 12 months, you worried that your food would run out before you got the money to buy more.: Never true    Within the past 12 months, the food you bought just didn't last and you didn't have money to get more.: Never true  Transportation Needs: No Transportation Needs (04/13/2024)   Received from [facility] - Transportation    Lack of Transportation (Medical): No    Lack of Transportation (Non-Medical): No  Physical Activity: Patient Declined (02/08/2023)   Received from  [facility]   Exercise Vital Sign    On average, how many days per week do you engage in moderate to strenuous exercise (like a brisk walk)?: Patient declined    On average, how many minutes do you engage in exercise at this level?: Patient declined  Stress: No Stress Concern Present (02/08/2023)   Received from [facility] - Occupational Stress Questionnaire    Feeling of Stress : Not at all  Social Connections: Socially Integrated (02/08/2023)   Received from [facility]   Social Network    How would you rate your social network (family, work, friends)?: Good participation with social networks  Intimate Partner Violence: Not At Risk (11/06/2023)   Humiliation, Afraid, Rape, and Kick questionnaire    Fear of Current or Ex-Partner: No    Emotionally Abused: No    Physically Abused: No    Sexually Abused: No    FAMILY HISTORY: Family  History  Problem Relation Age of Onset   Aneurysm Mother        brain   Hypertension Mother    Cancer Father    Heart disease Father    Diabetes Father    Hypertension Father    Hyperlipidemia Father    Cancer Maternal Grandmother        melanoma   Heart disease Paternal Grandfather    Colon cancer Neg Hx     ALLERGIES:  is allergic to codeine.  MEDICATIONS:  Current Outpatient Medications  Medication Sig Dispense Refill   aspirin  81 MG EC tablet Take 81 mg by mouth daily.     atorvastatin  (LIPITOR) 40 MG tablet Take 40 mg by mouth at bedtime.     chlorthalidone (HYGROTON) 25 MG tablet Take 12.5 mg by mouth daily.     empagliflozin (JARDIANCE) 10 MG TABS tablet Take 10 mg by mouth daily.     furosemide (LASIX) 20 MG tablet Take 20 mg by mouth daily as needed for fluid or edema. prn     insulin  glargine (LANTUS  SOLOSTAR) 100 UNIT/ML Solostar Pen Inject 20 Units into the skin 2 (two) times daily.     lisinopril  (ZESTRIL ) 20 MG tablet Take 20 mg by mouth daily.     sodium bicarbonate  650 MG  tablet Take 650 mg by mouth 3 (three) times daily.     tirzepatide  (MOUNJARO ) 15 MG/0.5ML Pen Inject 15 mg into the skin once a week. 6 mL 1   traMADol  (ULTRAM ) 50 MG tablet Take 1 tablet by mouth every 6 (six) hours as needed.     No current facility-administered medications for this visit.    PHYSICAL EXAMINATION: Vitals:   09/13/24 0928  BP: (!) 142/76  Pulse: (!) 58  Resp: 16  Temp: (!) 96 F (35.6 C)  SpO2: 100%   Filed Weights   09/13/24 0928  Weight: 176 lb 12.8 oz (80.2 kg)   Physical Exam Constitutional:      Appearance: He is not ill-appearing.  HENT:     Mouth/Throat:     Pharynx: Oropharyngeal exudate and posterior oropharyngeal erythema present.  Eyes:     General: No scleral icterus.    Conjunctiva/sclera: Conjunctivae normal.  Cardiovascular:     Rate and Rhythm: Normal rate and regular rhythm.  Abdominal:     General: There is no distension.     Palpations: Abdomen is soft.     Tenderness: There is no abdominal tenderness. There is no guarding.  Musculoskeletal:        General: No deformity.     Right lower leg: No edema.     Left lower leg: No edema.  Lymphadenopathy:     Cervical: No cervical adenopathy.  Skin:    General: Skin is warm and dry.     Coloration: Skin is not pale.  Neurological:     Mental Status: He is alert and oriented to person, place, and time. Mental status is at baseline.  Psychiatric:        Mood and Affect: Mood normal.        Behavior: Behavior normal.    LABORATORY DATA:  I have reviewed the data as listed Lab Results  Component Value Date   WBC 3.1 (L) 09/13/2024   HGB 9.5 (L) 09/13/2024   HCT 29.7 (L) 09/13/2024   MCV 96.1 09/13/2024   PLT 126 (L) 09/13/2024   Recent Labs    11/07/23 0427 11/08/23 0354 11/08/23 0355 11/09/23 0427 11/12/23  9550 11/13/23 0430 07/11/24 1400 07/13/24 0901 09/13/24 0922  NA 135  --  138   < > 149*   < > 139 137 138  K 4.7  --  3.7   < > 3.2*   < > 4.3 3.7 4.2  CL 107   --  109   < > 124*   < > 107 106 108  CO2 20*  --  18*   < > 20*   < > 21* 21* 21*  GLUCOSE 51*  --  87   < > 235*   < > 138* 226* 161*  BUN 74*  --  64*   < > 56*   < > 30* 35* 42*  CREATININE 4.41*  --  3.36*   < > 1.98*   < > 2.44* 2.24* 2.22*  CALCIUM  8.0*  --  8.3*   < > 8.2*   < > 8.8* 8.4* 8.7*  GFRNONAA 14*  --  20*   < > 37*   < > 29* 32* 33*  PROT 4.9* 5.2*  --   --  5.6*  --   --   --   --   ALBUMIN 1.9* 1.9* 1.9*  --  2.0*  --   --   --   --   AST 114* 65*  --   --  13*  --   --   --   --   ALT 139* 116*  --   --  40  --   --   --   --   ALKPHOS 63 77  --   --  82  --   --   --   --   BILITOT 0.5 0.3  --   --  0.7  --   --   --   --   BILIDIR  --  0.1  --   --   --   --   --   --   --   IBILI  --  0.2*  --   --   --   --   --   --   --    < > = values in this interval not displayed.   No results found.  Assessment & Plan:   # Symptomatic anemia- Anemia-symptomatic fatigue hemoglobin 9-10--likely multifactorial-iron  malabsorption/celiac disease/CKD-stage MAY 2024- Bone marrow biopsy/aspiration- The bone marrow is generally normocellular for age with trilineage  hematopoiesis and nonspecific changes, likely secondary in nature in this setting. No morphologic evidence of a lymphoproliferative process or plasma cell neoplasm.   # Anemia: hb 9.5. Overall stable/improving. Ferritin- > 300, Iron  saturation- 32. No role for venofer  currently. Proceed with retacrit .    # 09/07/2023: Stage II (cT3, cN1, cM0, p16+) Right Tonsillar Cancer -currently s/p definitive chemoradiation with CarboTaxol weekly [[facility]]. COmpleted 11/04/23. Clinical response noted. 02/09/2024- Overall uptake in right palatine tonsil and right level 2 node has resolved. There is no definite evidence of metabolically active disease at the present time. No new hypermetabolic lesions to suggest distant metastatic disease. Dr. Denys & Dr Elmer to [facility]. Reviewed note from 08/22/24 [facility] visit.    # chronic urinary obstruction-  s/p SPC-CKD stage III [nephro, Dr.Bhutani [place]]-diabetes- GFR- 30-  stable   # Diabetes [[facility] Hb A1c- 7.4]- continue follow-up with endocrinology/monitoring of blood sugars. Stable  # Thrush- plan for diflucan  tapered dosing x 2 weeks. Recommend good hygiene.    # DISPOSITION: Retacrit  today 2 mo- lab (cbc, bmp,  ferritin, iron  studies), Dr Rennie, +/- retacrit  or venofer - la  No problem-specific Assessment & Plan notes found for this encounter.  All questions were answered. The patient knows to call the clinic with any problems, questions or concerns.    Tinnie KANDICE Dawn, NP 09/13/2024

## 2024-09-13 NOTE — Progress Notes (Signed)
 Lt carpal surgery since last visit. Concerned he may have thrush, would like you to look at this.  C/o being tired/no energy and legs feel tired/weak.

## 2024-09-23 ENCOUNTER — Encounter: Payer: Self-pay | Admitting: Nurse Practitioner

## 2024-09-24 ENCOUNTER — Telehealth: Payer: Self-pay | Admitting: *Deleted

## 2024-09-24 NOTE — Telephone Encounter (Signed)
 Spoke with patient/wife. Per pt, sherleen will be out of network come Jan for patient. He will not be able to go to [facility] for his radiation oncology care/ENT. Pt is now under surveillance/routine follow-up. Per Dr. KATHEE- he is happy to follow up with patient here when pt comes for the iron  infusions in December. Pt instructed to keep this apt. Pt has established with Dr. Glendia Dolly in the past. Wife will call Dr. Larinda office shortly to see if they need a new referral to reestablish with care. IF so, she will msg our team back. Pt/wife thanked me for calling.

## 2024-11-01 ENCOUNTER — Other Ambulatory Visit (HOSPITAL_COMMUNITY): Payer: Self-pay

## 2024-11-01 MED FILL — Tirzepatide Soln Auto-injector 15 MG/0.5ML: 15.0000 mg | SUBCUTANEOUS | 84 days supply | Qty: 6 | Fill #0 | Status: AC

## 2024-11-02 ENCOUNTER — Telehealth: Payer: Self-pay

## 2024-11-02 NOTE — Telephone Encounter (Signed)
 Patient and spouse called requesting information regarding the patients last colonoscopy, as the referring provider indicated the patient may be due for another procedure. They inquired how this would be managed since Dr. Jinny is no longer seeing patients in the office.  They were informed that Dr. Jinny is still able to perform procedures; however, the patient would need to establish care with another provider for the office visit. Patient and spouse stated they understand and are agreeable to this plan and requested that Dr. Jinny perform the procedure.

## 2024-11-05 ENCOUNTER — Other Ambulatory Visit (HOSPITAL_COMMUNITY): Payer: Self-pay | Admitting: Internal Medicine

## 2024-11-05 ENCOUNTER — Encounter: Payer: Self-pay | Admitting: Internal Medicine

## 2024-11-05 DIAGNOSIS — R0989 Other specified symptoms and signs involving the circulatory and respiratory systems: Secondary | ICD-10-CM

## 2024-11-07 ENCOUNTER — Telehealth: Payer: Self-pay

## 2024-11-07 NOTE — Telephone Encounter (Signed)
 Patint's wife-Lani called wanting to schedule her husband's colonoscopy. I let her know that after reviewing his chart and pathology report, Dr. Jinny recommended a repeat colonoscopy 5 years from last colonoscopy which was on 09/07/2021. Therefore, I let her know that he is placed in our recall list and that it's due until after 09/07/2026. Lani stated that she would want a copy sent to his new PCP's office so they know that he is still not due. She stated that her husband goes to [facility]. I then went online and found their office and fax number so I could send to them. I also told Lani that I would be mailing her the confirmation by mail with the confirmation and the copy of the results for their records.Lani had no further questions.

## 2024-11-09 ENCOUNTER — Ambulatory Visit (HOSPITAL_COMMUNITY)
Admission: RE | Admit: 2024-11-09 | Discharge: 2024-11-09 | Disposition: A | Discharge status: Home / Self Care | Source: Ambulatory Visit | Attending: Internal Medicine | Admitting: Internal Medicine

## 2024-11-09 DIAGNOSIS — R0989 Other specified symptoms and signs involving the circulatory and respiratory systems: Secondary | ICD-10-CM | POA: Insufficient documentation

## 2024-11-13 ENCOUNTER — Encounter: Payer: Self-pay | Admitting: Internal Medicine

## 2024-11-14 ENCOUNTER — Inpatient Hospital Stay (HOSPITAL_BASED_OUTPATIENT_CLINIC_OR_DEPARTMENT_OTHER): Admitting: Internal Medicine

## 2024-11-14 ENCOUNTER — Encounter: Payer: Self-pay | Admitting: Internal Medicine

## 2024-11-14 ENCOUNTER — Inpatient Hospital Stay

## 2024-11-14 ENCOUNTER — Inpatient Hospital Stay: Attending: Internal Medicine

## 2024-11-14 VITALS — BP 160/65 | HR 62 | Temp 98.6°F | Resp 20 | Ht 68.0 in | Wt 177.2 lb

## 2024-11-14 DIAGNOSIS — E1122 Type 2 diabetes mellitus with diabetic chronic kidney disease: Secondary | ICD-10-CM | POA: Diagnosis present

## 2024-11-14 DIAGNOSIS — C109 Malignant neoplasm of oropharynx, unspecified: Secondary | ICD-10-CM | POA: Diagnosis not present

## 2024-11-14 DIAGNOSIS — I129 Hypertensive chronic kidney disease with stage 1 through stage 4 chronic kidney disease, or unspecified chronic kidney disease: Secondary | ICD-10-CM | POA: Diagnosis present

## 2024-11-14 DIAGNOSIS — D649 Anemia, unspecified: Secondary | ICD-10-CM | POA: Diagnosis not present

## 2024-11-14 DIAGNOSIS — D631 Anemia in chronic kidney disease: Secondary | ICD-10-CM | POA: Diagnosis not present

## 2024-11-14 DIAGNOSIS — N183 Chronic kidney disease, stage 3 unspecified: Secondary | ICD-10-CM | POA: Diagnosis present

## 2024-11-14 LAB — CBC WITH DIFFERENTIAL (CANCER CENTER ONLY)
Abs Immature Granulocytes: 0.01 K/uL (ref 0.00–0.07)
Basophils Absolute: 0 K/uL (ref 0.0–0.1)
Basophils Relative: 0 %
Eosinophils Absolute: 0.1 K/uL (ref 0.0–0.5)
Eosinophils Relative: 3 %
HCT: 29.7 % — ABNORMAL LOW (ref 39.0–52.0)
Hemoglobin: 9.6 g/dL — ABNORMAL LOW (ref 13.0–17.0)
Immature Granulocytes: 0 %
Lymphocytes Relative: 8 %
Lymphs Abs: 0.3 K/uL — ABNORMAL LOW (ref 0.7–4.0)
MCH: 30.9 pg (ref 26.0–34.0)
MCHC: 32.3 g/dL (ref 30.0–36.0)
MCV: 95.5 fL (ref 80.0–100.0)
Monocytes Absolute: 0.3 K/uL (ref 0.1–1.0)
Monocytes Relative: 10 %
Neutro Abs: 2.7 K/uL (ref 1.7–7.7)
Neutrophils Relative %: 79 %
Platelet Count: 138 K/uL — ABNORMAL LOW (ref 150–400)
RBC: 3.11 MIL/uL — ABNORMAL LOW (ref 4.22–5.81)
RDW: 13.2 % (ref 11.5–15.5)
WBC Count: 3.5 K/uL — ABNORMAL LOW (ref 4.0–10.5)
nRBC: 0 % (ref 0.0–0.2)

## 2024-11-14 LAB — BASIC METABOLIC PANEL - CANCER CENTER ONLY
Anion gap: 11 (ref 5–15)
BUN: 36 mg/dL — ABNORMAL HIGH (ref 8–23)
CO2: 22 mmol/L (ref 22–32)
Calcium: 9.2 mg/dL (ref 8.9–10.3)
Chloride: 109 mmol/L (ref 98–111)
Creatinine: 2.08 mg/dL — ABNORMAL HIGH (ref 0.61–1.24)
GFR, Estimated: 35 mL/min — ABNORMAL LOW
Glucose, Bld: 182 mg/dL — ABNORMAL HIGH (ref 70–99)
Potassium: 4.4 mmol/L (ref 3.5–5.1)
Sodium: 142 mmol/L (ref 135–145)

## 2024-11-14 LAB — IRON AND TIBC
Iron: 64 ug/dL (ref 45–182)
Saturation Ratios: 24 % (ref 17.9–39.5)
TIBC: 266 ug/dL (ref 250–450)
UIBC: 202 ug/dL

## 2024-11-14 LAB — SAMPLE TO BLOOD BANK

## 2024-11-14 LAB — FERRITIN: Ferritin: 654 ng/mL — ABNORMAL HIGH (ref 24–336)

## 2024-11-14 MED ORDER — EPOETIN ALFA-EPBX 20000 UNIT/ML IJ SOLN
20000.0000 [IU] | Freq: Once | INTRAMUSCULAR | Status: AC
  Administered 2024-11-14: 20000 [IU] via SUBCUTANEOUS

## 2024-11-14 NOTE — Progress Notes (Signed)
 Patient states his PCP told him last week that he needs to start an iron  supplement.

## 2024-11-14 NOTE — Progress Notes (Signed)
 Brian Nielsen  Patient Care Team: Brian Duwaine MATSU, FNP as PCP - General (Family Medicine) Brian Lonni BIRCH, MD as PCP - Cardiology (Cardiology) Brian Maize, NP (Inactive) as Nurse Practitioner (Endocrinology) Brian Cindy SAUNDERS, MD as Consulting Physician (Oncology)  CHIEF COMPLAINTS/PURPOSE OF CONSULTATION: ANEMIA   HEMATOLOGY HISTORY:  # ANEMIA PROGRESSIVE since 2020 12; 2022- 10 MCV- 90s; colonoscopy 2015; multiple endoscopies-last September 2021-biopsy active celiac disease; MAY 2024- Bone marrow biopsy/aspiration- The bone marrow is generally normocellular for age with trilineage  hematopoiesis and nonspecific changes, likely secondary in nature in  this setting.  There is no morphologic evidence of a lymphoproliferative  process or plasma cell neoplasm.   #CKD stage III [[facility]]/diabetes [[facility]]; ? Celaic disease [[facility]];    - Labs: 07/2014 - tTG IgA 101 03/2020 - positive endomysial Ab IgA, tTG IgA 78 - CSY: 07/2014 - normal appearing terminal ileum, sigmoid diverticulosis, and non-bleeding internal hemorrhoids - EGD: 07/2014 - mild non-erosive gastritis, normal appearing duodenal mucosa with duodenal biopsies showing celiac sprue - EGD: 06/03/2020 - procedure aborted due to presence of food - EGD: 08/12/2020 - irregular Z-line found at [facility] with bx showing mild reflux esophagitis, 1 cm hiatal hernia, mild chronic gastritis, decreased folds found in entire duodenum, flattening found in entire duodenum and scalloped mucosa with bx showing active Celiac disease with villous atrophy, increased intraepithelial lymphocytes, etc. Repeat in 1-year.  HISTORY OF PRESENTING ILLNESS:  with wife.  Ambulating independently.  Brian Nielsen 63 y.o.  male symptomatic anemia-likely secondary to chronic renal disease-/chronic urinary obstruction- s/p SPC -IDA / poorly controlled diabetes-complications of CKD; peripheral neuropathy;and Hx  p16  positive tonsil cancer currently s/p  definitive chemoradiation at [facility] is here for follow-up.  Discussed the use of AI scribe software for clinical [name] with the patient, who gave verbal consent to proceed.  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  He completed chemoradiation for right tonsil squamous cell carcinoma at [facility]. Surveillance imaging, including a PET scan in October 2024 and a neck ultrasound in December 2025, were performed. The patient recently saw an ENT doctor who performed an endoscopic assessment.  He continues to experience chronic anemia, with hemoglobin levels that have improved from a low of 7.4 g/dL to 9.2 g/dL. He is intolerant of oral iron  supplementation due to constipation. His white blood cell count has been low but is higher than previous values.  He has chronic kidney disease stage 3, with recent creatinine clearance of 35 mL/min and normal potassium levels. He adheres to dietary potassium restrictions. Home blood pressure readings fluctuate between 120 and 200 mmHg.  He has type 2 diabetes mellitus, with a recent hemoglobin A1c of 5.8%. Peripheral neuropathy is present. He has right carotid artery atherosclerosis with 70% stenosis on recent ultrasound. He expresses concern regarding stroke risk due to carotid disease.      Review of Systems  Constitutional:  Positive for malaise/fatigue. Negative for chills, diaphoresis and fever.  HENT:  Negative for nosebleeds and sore throat.   Eyes:  Negative for double vision.  Respiratory:  Negative for cough, hemoptysis, sputum production and wheezing.   Cardiovascular:  Negative for chest pain, palpitations, orthopnea and leg swelling.  Gastrointestinal:  Negative for blood in stool, heartburn,  melena, nausea and vomiting.  Genitourinary:  Negative for dysuria, frequency and urgency.  Musculoskeletal:  Positive for back pain and joint pain.  Skin: Negative.  Negative for itching and rash.  Neurological:  Positive for weakness. Negative for dizziness, tingling, focal weakness and headaches.  Endo/Heme/Allergies:  Does not bruise/bleed easily.  Psychiatric/Behavioral:  Negative for depression. The patient is not nervous/anxious and does not have insomnia.     MEDICAL HISTORY:  Past Medical History:  Diagnosis Date   Adult celiac disease 2015   followed by Brian Nielsen (GI)   Anemia associated with chronic renal failure    hematologist--- Brian Nielsen;  treated with iron  infusions   Benign localized prostatic hyperplasia with lower urinary tract symptoms (LUTS)    urologist--- Brian Nielsen   Cancer [facility]) 08/18/2023   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   Charcot foot due to diabetes mellitus (HCC)    Chronic constipation    CKD (chronic kidney disease), stage III [facility])    nephrologist--- Brian Nielsen;  renal bx 11-20-2021   Diverticulosis of colon    Edema of both lower extremities    GERD (gastroesophageal reflux disease)    History of adenomatous polyp of colon    HTN (hypertension)    Hyperlipidemia, mixed    Peripheral neuropathy    Peyronie's disease    Pneumonia    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 meatal stricture    Retinopathy due to secondary diabetes mellitus (HCC)    both eyes  treated with injecitons   Sleep apnea    uses CPAP nightly   Suprapubic catheter (HCC)    due to neurogenic bladder   Thoracic spondylosis without myelopathy 11/22/2016   Type 2 diabetes mellitus [facility])    endocrinologist--- Brian Nielsen   Vitamin D  deficiency    Wears hearing aid in both ears     SURGICAL HISTORY: Past Surgical History:  Procedure Laterality Date   BIOPSY N/A 09/07/2021   Procedure: BIOPSY;  Surgeon: Brian Carmine, MD;  Location: [facility];   Service: Endoscopy;  Laterality: N/A;   BOTOX  INJECTION N/A 01/19/2024   Procedure: BOTOX  INJECTION;  Surgeon: Brian Valli BIRCH, MD;  [facility];  Service: Urology;  Laterality: N/A;   CARPAL TUNNEL WITH CUBITAL TUNNEL Left 07/18/2024   Procedure: RELEASE, CARPAL TUNNEL AND CUBITAL TUNNEL;  Surgeon: Brian Harari, MD;  Location: [facility];  Service: Orthopedics;  Laterality: Left;  Left carpal tunnel release, left cubital tunnel release with anterior transposition   CATARACT EXTRACTION W/ INTRAOCULAR LENS IMPLANT Bilateral 2023   COLONOSCOPY N/A 05/03/2014   Procedure: COLONOSCOPY;  Surgeon: Margo LITTIE Haddock, MD;  [facility];  Service: Endoscopy;  Laterality: N/A;  12:00   COLONOSCOPY WITH PROPOFOL  N/A 09/07/2021   Procedure: COLONOSCOPY WITH PROPOFOL ;  Surgeon: Brian Carmine, MD;  Location: [facility];  Service: Endoscopy;  Laterality: N/A;   CYSTOSCOPY N/A 01/19/2024   Procedure: CYSTOSCOPY,SUPRAPUBIC TUBE EXCHANGE;  Surgeon: Brian Valli BIRCH, MD;  Location: [facility];  Service: Urology;  Laterality: N/A;  30 MINUTE CASE   CYSTOSCOPY WITH INJECTION N/A 07/26/2023   Procedure: CYSTOSCOPY WITH BOTOX  INJECTION 100 UNITS;  Surgeon: Brian Valli BIRCH, MD;  Location: [facility];  Service: Urology;  Laterality: N/A;  30 minutes   CYSTOSCOPY WITH URETHRAL DILATATION N/A 01/10/2023   Procedure: CYSTOSCOPY;  Surgeon: Brian Senior, MD;  Location[facility];  Service: Urology;  Laterality: N/A;   ESOPHAGOGASTRODUODENOSCOPY N/A 05/03/2014   Procedure: ESOPHAGOGASTRODUODENOSCOPY (EGD);  Surgeon: Brian L  Fields, MD;  [facility];  Service: Endoscopy;  Laterality: N/A;   ESOPHAGOGASTRODUODENOSCOPY (EGD) WITH PROPOFOL  N/A 06/03/2020   Procedure: ESOPHAGOGASTRODUODENOSCOPY (EGD) WITH PROPOFOL ;  Surgeon: Brian Ole DASEN, MD;  Location: [facility];  Service: Endoscopy;  Laterality: N/A;   ESOPHAGOGASTRODUODENOSCOPY (EGD) WITH PROPOFOL  N/A  09/07/2021   Procedure: ESOPHAGOGASTRODUODENOSCOPY (EGD) WITH PROPOFOL ;  Surgeon: Brian Carmine, MD;  Location: [facility];  Service: Endoscopy;  Laterality: N/A;  Diabetic   IR BONE MARROW BIOPSY & ASPIRATION  04/27/2023   MEATOTOMY N/A 01/10/2023   Procedure: MEATOTOMY ADULT;  Surgeon: Brian Senior, MD;  Location: [facility];  Service: Urology;  Laterality: N/A;   PARS PLANA VITRECTOMY Right 03/21/2021   Procedure: PARS PLANA VITRECTOMY 25 GAUGE WITH INJECTION OF ANTIBIOTICS FOR ENDOPHTHALMITIS;  Surgeon: Brian Baptist, MD;  Location: [facility] OR;  Service: Ophthalmology;  Laterality: Right;   POLYPECTOMY N/A 09/07/2021   Procedure: POLYPECTOMY;  Surgeon: Brian Carmine, MD;  Location: [facility];  Service: Endoscopy;  Laterality: N/A;   REPAIR EXTENSOR TENDON Right 08/11/2022   Procedure: EXTENSOR CARPI ULNARIS TENDON DEBRIDEMENT, POSSIBLE SUBSHEATH RECONSTRUCTION;  Surgeon: Brian Harari, MD;  Location: [facility];  Service: Orthopedics;  Laterality: Right;  or regional plus MAC    SOCIAL HISTORY: Social History   Socioeconomic History   Marital status: Married    Spouse name: Brian Nielsen   Number of children: 1   Years of education: 12   Highest education level: Not on file  Occupational History   Occupation: Architect: [employer]  Tobacco Use   Smoking status: Former    Current packs/day: 0.00    Average packs/day: 0.5 packs/day for 12.0 years (6.0 ttl pk-yrs)    Types: Cigarettes    Start date: 99    Quit date: 1985    Years since quitting: 41.0   Smokeless tobacco: Current    Types: Snuff   Tobacco comments:    Dips daily  Vaping Use   Vaping status: Never Used  Substance and Sexual Activity   Alcohol use: Not Currently    Comment: seldom   Drug use: Never   Sexual activity: Yes  Other Topics Concern   Not on file  Social History Narrative   Lives with wife   [place]   Caffeine- diet Mtn Dew, 2 glasses   Works as scientist, water quality for [employer]   Social Drivers of Health   Tobacco Use: High Risk (11/14/2024)   Patient History    Smoking Tobacco Use: Former    Smokeless Tobacco Use: Current    Passive Exposure: Not on Actuary Strain: Low Risk (11/23/2023)   Received from [facility]   Overall Financial Resource Strain (CARDIA)    Difficulty of Paying Living Expenses: Not hard at all  Food Insecurity: No Food Insecurity (04/13/2024)   Received from [facility]   Epic    Within the past 12 months, you worried that your food would run out before you got the money to buy more.: Never true    Within the past 12 months, the food you bought just didn't last and you didn't have money to get more.: Never true  Transportation Needs: No Transportation Needs (04/13/2024)   Received from [facility] - Transportation    Lack of Transportation (Medical): No    Lack of Transportation (Non-Medical): No  Physical Activity: Patient Declined (  02/08/2023)   Received from [facility]   Exercise Vital Sign    On average, how many days per week do you engage in moderate to strenuous exercise (like a brisk walk)?: Patient declined    On average, how many minutes do you engage in exercise at this level?: Patient declined  Stress: No Stress Concern Present (02/08/2023)   Received from [facility] - Occupational Stress Questionnaire    Feeling of Stress : Not at all  Social Connections: Socially Integrated (02/08/2023)   Received from [facility]   Social Network    How would you rate your social network (family, work, friends)?: Good participation with social networks  Intimate Partner Violence: Not At Risk (11/06/2023)   Humiliation, Afraid, Rape, and Kick questionnaire    Fear of Current or Ex-Partner: No    Emotionally Abused: No    Physically Abused: No     Sexually Abused: No  Depression (PHQ2-9): Low Risk (09/13/2024)   Depression (PHQ2-9)    PHQ-2 Score: 0  Alcohol Screen: Not on file  Housing: Low Risk (11/23/2023)   Received from [facility]   Epic    At any time in the past 12 months, were you homeless or living in a shelter (including now)?: No    In the past 12 months, how many times have you moved where you were living?: 0    In the last 12 months, was there a time when you were not able to pay the mortgage or rent on time?: No  Utilities: Low Risk (04/13/2024)   Received from [facility]   Utilities    Within the past 12 months, have you been unable to get utilities(heat, electricity) when it was really needed?: No  Health Literacy: Not on file    FAMILY HISTORY: Family History  Problem Relation Age of Onset   Aneurysm Mother        brain   Hypertension Mother    Cancer Father    Heart disease Father    Diabetes Father    Hypertension Father    Hyperlipidemia Father    Cancer Maternal Grandmother        melanoma   Heart disease Paternal Grandfather    Colon cancer Neg Hx     ALLERGIES:  is allergic to codeine.  MEDICATIONS:  Current Outpatient Medications  Medication Sig Dispense Refill   aspirin  81 MG EC tablet Take 81 mg by mouth daily.     atorvastatin  (LIPITOR) 40 MG tablet Take 40 mg by mouth at bedtime.     chlorthalidone (HYGROTON) 25 MG tablet Take 12.5 mg by mouth daily.     empagliflozin (JARDIANCE) 10 MG TABS tablet Take 10 mg by mouth daily.     furosemide (LASIX) 20 MG tablet Take 20 mg by mouth daily as needed for fluid or edema. prn     insulin  glargine (LANTUS  SOLOSTAR) 100 UNIT/ML Solostar Pen Inject 20 Units into the skin 2 (two) times daily.     lisinopril  (ZESTRIL ) 20 MG tablet Take 20 mg by mouth daily.     sodium bicarbonate  650 MG tablet Take 650 mg by mouth 3 (three) times daily.     tirzepatide  (MOUNJARO ) 15 MG/0.5ML Pen Inject 15 mg into the skin once a week. 6 mL 1   tirzepatide   (MOUNJARO ) 15 MG/0.5ML Pen Inject 15 mg into the skin once a week. 6 mL 0   traMADol  (ULTRAM ) 50 MG tablet  Take 1 tablet by mouth every 6 (six) hours as needed.     No current facility-administered medications for this visit.      PHYSICAL EXAMINATION:  S/p Suprapubic catheter. 2cm LN in right neck; Right enlarged tonsil.  Vitals:   11/14/24 0906 11/14/24 1035  BP: (!) 175/82 (!) 160/65  Pulse: 62   Resp: 20   Temp: 98.6 F (37 C)   SpO2: 100%      Filed Weights   11/14/24 0906  Weight: 177 lb 3.2 oz (80.4 kg)      Physical Exam Vitals and nursing Nielsen reviewed.  Constitutional:      Comments:    HENT:     Head: Normocephalic and atraumatic.     Mouth/Throat:     Pharynx: Oropharynx is clear.  Eyes:     Extraocular Movements: Extraocular movements intact.     Pupils: Pupils are equal, round, and reactive to light.  Cardiovascular:     Rate and Rhythm: Normal rate and regular rhythm.  Pulmonary:     Comments: Decreased breath sounds bilaterally.  Abdominal:     Palpations: Abdomen is soft.  Musculoskeletal:        General: Normal range of motion.     Cervical back: Normal range of motion.  Skin:    General: Skin is warm.  Neurological:     General: No focal deficit present.     Mental Status: He is alert and oriented to person, place, and time.  Psychiatric:        Behavior: Behavior normal.        Judgment: Judgment normal.     LABORATORY DATA:  I have reviewed the data as listed Lab Results  Component Value Date   WBC 3.5 (L) 11/14/2024   HGB 9.6 (L) 11/14/2024   HCT 29.7 (L) 11/14/2024   MCV 95.5 11/14/2024   PLT 138 (L) 11/14/2024   Recent Labs    07/13/24 0901 09/13/24 0922 11/14/24 0931  NA 137 138 142  K 3.7 4.2 4.4  CL 106 108 109  CO2 21* 21* 22  GLUCOSE 226* 161* 182*  BUN 35* 42* 36*  CREATININE 2.24* 2.22* 2.08*  CALCIUM  8.4* 8.7* 9.2  GFRNONAA 32* 33* 35*     US  Carotid Bilateral Result Date: 11/09/2024 CLINICAL  DATA:  Carotid stenosis in a patient with a history of squamous cell carcinoma of tonsils, hypertension, and hyperlipidemia EXAM: BILATERAL CAROTID DUPLEX ULTRASOUND TECHNIQUE: Brian Nielsen scale imaging, color Doppler and duplex ultrasound were performed of bilateral carotid and vertebral arteries in the neck. COMPARISON:  None Available. FINDINGS: Criteria: Quantification of carotid stenosis is based on velocity parameters that correlate the residual internal carotid diameter with NASCET-based stenosis levels, using the diameter of the distal internal carotid lumen as the denominator for stenosis measurement. The following velocity measurements were obtained: RIGHT ICA: 164/36 cm/sec CCA: 108/16 cm/sec SYSTOLIC ICA/CCA RATIO:  1.2 ECA: 137 PSV LEFT ICA: 134/31 cm/sec CCA: 197/30 cm/sec SYSTOLIC ICA/CCA RATIO:  0.7 ECA: 206 PSV RIGHT CAROTID ARTERY: The right common carotid artery demonstrates mixed smooth plaque. The right carotid bulb demonstrates irregular calcified plaque. The right ICA demonstrates irregular calcified plaque. RIGHT VERTEBRAL ARTERY:  Antegrade arterial flow LEFT CAROTID ARTERY: The left common carotid artery demonstrates mixed smooth plaque. The left carotid bulb demonstrates irregular calcified plaque. The left internal carotid artery demonstrates calcified irregular plaque. LEFT VERTEBRAL ARTERY:  Antegrade arterial flow IMPRESSION: 50-69% stenosis bilateral internal carotid arteries right greater than left. Electronically  Signed   By: Brian Nielsen Banner   On: 11/09/2024 15:53    Symptomatic anemia #Anemia-symptomatic fatigue hemoglobin 9-10--likely multifactorial-iron  malabsorption/celiac disease/CKD-stage MAY 2024- Bone marrow biopsy/aspiration- The bone marrow is generally normocellular for age with trilineage  hematopoiesis and nonspecific changes, likely secondary in nature in  this setting.  There is no morphologic evidence of a lymphoproliferative  process or plasma cell neoplasm.   #  Anemia: hb 9.4 overall stable/- improving- JAN 2025-ferritin- > 300-  -iron  saturation- 128.-HOLD Retacrit   HOLD venofer  today.   # 09/07/2023: Stage II (cT3, cN1, cM0, p16+)  -currently s/p definitive chemoradiation with CarboTaxol weekly [[facility]] -clinical response noted.   02/09/2024- -Overall uptake in right palatine tonsil and right level 2 node has resolved. There is no definite evidence of metabolically active disease at the present time. -No new hypermetabolic lesions to suggest distant metastatic disease. Defre to [facility].   # chronic urinary obstruction- s/p SPC-Ckd stage III [nephro, Brian.Bhutani [place]]-diabetes- GFR- 30-  stable  # Diabetes [[facility] Hb A1c- 7.4]- continue follow-up with endocrinology/monitoring of blood sugars. stable  # DISPOSITION: # NO Venofer  today; NO retacrit  # follow up in 2  months-; MD;  labs- cbc/bmp;iron  studies;ferritin- ;possible venofer  or retacrit -  -Brian.B   Oropharynx cancer (HCC) # 09/07/2023: [Brian.Trivdei-[facility]/ Brian.Vaught] Stage II (cT3, cN1, cM0, p16+)  -currently s/p definitive chemoradiation with CarboTaxol weekly [[facility]] -clinical response noted.   02/09/2024- -Overall uptake in right palatine tonsil and right level 2 node has resolved. There is no definite evidence of metabolically active disease at the present time. -No new hypermetabolic lesions to suggest distant metastatic disease. .  DEC 24th, 2025- US  neck- No sonographically suspicious lymph nodes.   # Anemia: sec to CKD  [facility] 2024- Bone marrow biopsy/aspiration- The bone marrow is generally normocellular for age with trilineage  hematopoiesis and nonspecific changes, likely secondary in nature in  this setting. ] hb 9.2 overall stable/- improving- JAN 2025-ferritin- > 300-  -iron  saturation- 128. Proceed with Retacrit  .  HOLD venofer  today [poor PO iron  tolerance]   # chronic urinary obstruction- s/p SPC-Ckd stage III [nephro, Brian.Bhutani [place]]-diabetes- GFR- 30-  stable  # PVD- right carotid  stenosis -70%- awaiting evaluation with vascular-   # HTN- [Nephrology] labile- recommend monitor at home.   # Diabetes [[facility] Hb A1c- 5.8]- continue follow-up with endocrinology/monitoring of blood sugars. stable  # DISPOSITION: # NO Venofer  today; OK with  retacrit  # follow up in 2  months-; MD;  labs- cbc/bmp; possible venofer  or retacrit -  -Brian.B  All questions were answered. The patient knows to call the clinic with any problems, questions or concerns.    Cindy JONELLE Joe, MD 11/14/2024 11:08 AM

## 2024-11-14 NOTE — Assessment & Plan Note (Addendum)
#   09/07/2023: [Dr.Trivdei-[facility]/ Dr.Vaught] Stage II (cT3, cN1, cM0, p16+)  -currently s/p definitive chemoradiation with CarboTaxol weekly [[facility]] -clinical response noted.   02/09/2024- -Overall uptake in right palatine tonsil and right level 2 node has resolved. There is no definite evidence of metabolically active disease at the present time. -No new hypermetabolic lesions to suggest distant metastatic disease. .  DEC 24th, 2025- US  neck- No sonographically suspicious lymph nodes.   # Anemia: sec to CKD  [facility] 2024- Bone marrow biopsy/aspiration- The bone marrow is generally normocellular for age with trilineage  hematopoiesis and nonspecific changes, likely secondary in nature in  this setting. ] hb 9.2 overall stable/- improving- JAN 2025-ferritin- > 300-  -iron  saturation- 128. Proceed with Retacrit  .  HOLD venofer  today [poor PO iron  tolerance]   # chronic urinary obstruction- s/p SPC-Ckd stage III [nephro, Dr.Bhutani [place]]-diabetes- GFR- 30-  stable  # PVD- right carotid stenosis -70%- awaiting evaluation with vascular-   # HTN- [Nephrology] labile- recommend monitor at home.   # Diabetes [[facility] Hb A1c- 5.8]- continue follow-up with endocrinology/monitoring of blood sugars. stable  # DISPOSITION: # NO Venofer  today; OK with  retacrit  # follow up in 2  months-; MD;  labs- cbc/bmp; possible venofer  or retacrit -  -Dr.B

## 2024-11-14 NOTE — Assessment & Plan Note (Signed)
#  Anemia-symptomatic fatigue hemoglobin 9-10--likely multifactorial-iron  malabsorption/celiac disease/CKD-stage MAY 2024- Bone marrow biopsy/aspiration- The bone marrow is generally normocellular for age with trilineage  hematopoiesis and nonspecific changes, likely secondary in nature in  this setting.  There is no morphologic evidence of a lymphoproliferative  process or plasma cell neoplasm.   # Anemia: hb 9.4 overall stable/- improving- JAN 2025-ferritin- > 300-  -iron  saturation- 128.-HOLD Retacrit   HOLD venofer  today.   # 09/07/2023: Stage II (cT3, cN1, cM0, p16+)  -currently s/p definitive chemoradiation with CarboTaxol weekly [[facility]] -clinical response noted.   02/09/2024- -Overall uptake in right palatine tonsil and right level 2 node has resolved. There is no definite evidence of metabolically active disease at the present time. -No new hypermetabolic lesions to suggest distant metastatic disease. Defre to [facility].   # chronic urinary obstruction- s/p SPC-Ckd stage III [nephro, Dr.Bhutani [place]]-diabetes- GFR- 30-  stable  # Diabetes [[facility] Hb A1c- 7.4]- continue follow-up with endocrinology/monitoring of blood sugars. stable  # DISPOSITION: # NO Venofer  today; NO retacrit  # follow up in 2  months-; MD;  labs- cbc/bmp;iron  studies;ferritin- ;possible venofer  or retacrit -  -Dr.B

## 2024-12-04 ENCOUNTER — Encounter: Payer: Self-pay | Admitting: Vascular Surgery

## 2024-12-04 ENCOUNTER — Ambulatory Visit: Attending: Vascular Surgery | Admitting: Vascular Surgery

## 2024-12-04 VITALS — BP 169/75 | HR 56 | Temp 97.9°F | Ht 68.0 in | Wt 184.0 lb

## 2024-12-04 DIAGNOSIS — I6523 Occlusion and stenosis of bilateral carotid arteries: Secondary | ICD-10-CM | POA: Diagnosis not present

## 2024-12-04 NOTE — Progress Notes (Signed)
 "  Patient ID: Brian Nielsen, male   DOB: 1961-03-14, 64 y.o.   MRN: [id number]  Reason for Consult: New Patient (Initial Visit)   Referred by Adine Duwaine MATSU, FNP  Subjective:     HPI:  Brian Nielsen is a 64 y.o. male without significant history of vascular disease.  He does have hypertension, diabetes, hyperlipidemia and chronic kidney disease.  States that his diabetes is well-controlled with most recent A1c 5.5.  He is a former smoker but only when he was young.  His hypertension is occasionally uncontrolled with systolic pressures over the 200 range.  He is on Lipitor.  He does have a history of radiation to his neck for cancer.  Denies history of stroke, TIA or amaurosis.  He does have visual changes which are stable and he undergoes injections and is scheduled today he walks without limitation other than a history of neuropathy for which he is on gabapentin but denies lower extremity rest pain or ulceration.  His mother had a brain aneurysm and there is no other aneurysmal history in his family.  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   Charcot foot due to diabetes mellitus (HCC)    Chronic constipation    CKD (chronic kidney disease), stage III [facility])    nephrologist--- dr rachele;  renal bx 11-20-2021   Diverticulosis of colon    Edema of both lower extremities    GERD (gastroesophageal reflux disease)    History of adenomatous polyp of colon    HTN (hypertension)    Hyperlipidemia, mixed    Peripheral neuropathy    Peyronie's disease    Pneumonia    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 meatal stricture    Retinopathy due to  secondary diabetes mellitus (HCC)    both eyes  treated with injecitons   Sleep apnea    uses CPAP nightly   Suprapubic catheter (HCC)    due to neurogenic bladder   Thoracic spondylosis without myelopathy 11/22/2016   Type 2 diabetes mellitus [facility])    endocrinologist--- dr nida   Vitamin D  deficiency    Wears hearing aid in both ears    Family History  Problem Relation Age of Onset   Aneurysm Mother        brain   Hypertension Mother    Cancer Father    Heart disease Father    Diabetes Father    Hypertension Father    Hyperlipidemia Father    Cancer Maternal Grandmother        melanoma   Heart disease Paternal Grandfather    Colon cancer Neg Hx    Past Surgical History:  Procedure Laterality Date   BIOPSY N/A 09/07/2021   Procedure: BIOPSY;  Surgeon: Jinny Carmine, MD;  Location: [facility];  Service: Endoscopy;  Laterality: N/A;   BOTOX  INJECTION N/A 01/19/2024   Procedure: BOTOX  INJECTION;  Surgeon: Elisabeth Valli BIRCH, MD;  Location: [facility];  Service: Urology;  Laterality: N/A;   CARPAL TUNNEL WITH CUBITAL TUNNEL Left 07/18/2024   Procedure: RELEASE, CARPAL TUNNEL AND CUBITAL TUNNEL;  Surgeon: Romona Harari, MD;  Location: [facility];  Service: Orthopedics;  Laterality: Left;  Left carpal tunnel release, left cubital tunnel release with anterior transposition   CATARACT EXTRACTION W/ INTRAOCULAR LENS IMPLANT Bilateral 2023   COLONOSCOPY N/A 05/03/2014   Procedure: COLONOSCOPY;  Surgeon: Margo LITTIE Haddock, MD;  Location: [facility];  Service: Endoscopy;  Laterality: N/A;  12:00   COLONOSCOPY WITH PROPOFOL  N/A 09/07/2021   Procedure: COLONOSCOPY WITH PROPOFOL ;  Surgeon: Jinny Carmine, MD;  Location: [facility];  Service: Endoscopy;  Laterality: N/A;   CYSTOSCOPY N/A 01/19/2024   Procedure: CYSTOSCOPY,SUPRAPUBIC TUBE EXCHANGE;  Surgeon: Elisabeth Valli BIRCH, MD;  Location: [facility];  Service: Urology;  Laterality: N/A;  30 MINUTE CASE   CYSTOSCOPY WITH  INJECTION N/A 07/26/2023   Procedure: CYSTOSCOPY WITH BOTOX  INJECTION 100 UNITS;  Surgeon: Elisabeth Valli BIRCH, MD;  Location: [facility];  Service: Urology;  Laterality: N/A;  30 minutes   CYSTOSCOPY WITH URETHRAL DILATATION N/A 01/10/2023   Procedure: CYSTOSCOPY;  Surgeon: Matilda Senior, MD;  Location: [facility];  Service: Urology;  Laterality: N/A;   ESOPHAGOGASTRODUODENOSCOPY N/A 05/03/2014   Procedure: ESOPHAGOGASTRODUODENOSCOPY (EGD);  Surgeon: Margo LITTIE Haddock, MD;  Location: [facility];  Service: Endoscopy;  Laterality: N/A;   ESOPHAGOGASTRODUODENOSCOPY (EGD) WITH PROPOFOL  N/A 06/03/2020   Procedure: ESOPHAGOGASTRODUODENOSCOPY (EGD) WITH PROPOFOL ;  Surgeon: Maryruth Ole DASEN, MD;  Location: [facility];  Service: Endoscopy;  Laterality: N/A;   ESOPHAGOGASTRODUODENOSCOPY (EGD) WITH PROPOFOL  N/A 09/07/2021   Procedure: ESOPHAGOGASTRODUODENOSCOPY (EGD) WITH PROPOFOL ;  Surgeon: Jinny Carmine, MD;  Location: [facility];  Service: Endoscopy;  Laterality: N/A;  Diabetic   IR BONE MARROW BIOPSY & ASPIRATION  04/27/2023   MEATOTOMY N/A 01/10/2023   Procedure: MEATOTOMY ADULT;  Surgeon: Matilda Senior, MD;  Location: [facility];  Service: Urology;  Laterality: N/A;   PARS PLANA VITRECTOMY Right 03/21/2021   Procedure: PARS PLANA VITRECTOMY 25 GAUGE WITH INJECTION OF ANTIBIOTICS FOR ENDOPHTHALMITIS;  Surgeon: Tobie Baptist, MD;  Location: [facility] OR;  Service: Ophthalmology;  Laterality: Right;   POLYPECTOMY N/A 09/07/2021   Procedure: POLYPECTOMY;  Surgeon: Jinny Carmine, MD;  Location: [facility];  Service: Endoscopy;  Laterality: N/A;   REPAIR EXTENSOR TENDON Right 08/11/2022   Procedure: EXTENSOR CARPI ULNARIS TENDON DEBRIDEMENT, POSSIBLE SUBSHEATH RECONSTRUCTION;  Surgeon: Romona Harari, MD;  Location: [facility];  Service: Orthopedics;  Laterality: Right;  or regional plus MAC    Short Social History:  Social History    Tobacco Use   Smoking status: Former    Current packs/day: 0.00    Average packs/day: 0.5 packs/day for 12.0 years (6.0 ttl pk-yrs)    Types: Cigarettes    Start date: 57    Quit date: 58    Years since quitting: 41.0   Smokeless tobacco: Current    Types: Snuff   Tobacco comments:    Dips daily  Substance Use Topics   Alcohol use: Not Currently    Comment: seldom    Allergies[1]  Current Outpatient Medications  Medication Sig Dispense Refill   aspirin  81 MG EC tablet Take 81 mg by mouth daily.     atorvastatin  (LIPITOR) 40 MG tablet Take 40 mg by mouth at bedtime.     chlorthalidone (HYGROTON) 25 MG tablet Take 12.5 mg by mouth daily.     empagliflozin (JARDIANCE) 10 MG TABS tablet Take 10 mg by mouth daily.     furosemide (LASIX) 20 MG tablet Take 20 mg by mouth daily as needed for fluid or  edema. prn     insulin  glargine (LANTUS  SOLOSTAR) 100 UNIT/ML Solostar Pen Inject 20 Units into the skin 2 (two) times daily.     lisinopril  (ZESTRIL ) 20 MG tablet Take 20 mg by mouth daily.     sodium bicarbonate  650 MG tablet Take 650 mg by mouth 3 (three) times daily.     tirzepatide  (MOUNJARO ) 15 MG/0.5ML Pen Inject 15 mg into the skin once a week. 6 mL 1   tirzepatide  (MOUNJARO ) 15 MG/0.5ML Pen Inject 15 mg into the skin once a week. 6 mL 0   traMADol  (ULTRAM ) 50 MG tablet Take 1 tablet by mouth every 6 (six) hours as needed.     No current facility-administered medications for this visit.    Review of Systems  Constitutional:  Constitutional negative. HENT: HENT negative.  Eyes: Positive for visual disturbance.   Respiratory: Respiratory negative.  Cardiovascular: Cardiovascular negative.  GI: Gastrointestinal negative.  Musculoskeletal: Positive for gait problem and joint pain.  Neurological: Positive for numbness.        Objective:  Objective   Vitals:   12/04/24 0856 12/04/24 0859  BP: (!) 164/75 (!) 169/75  Pulse: (!) 56   Temp: 97.9 F (36.6 C)   SpO2:  98%   Weight: 184 lb (83.5 kg)   Height: 5' 8 (1.727 m)    Body mass index is 27.98 kg/m.  Physical Exam HENT:     Head: Normocephalic.     Mouth/Throat:     Mouth: Mucous membranes are moist.  Eyes:     Pupils: Pupils are equal, round, and reactive to light.  Neck:     Vascular: Carotid bruit present.  Cardiovascular:     Rate and Rhythm: Normal rate.     Pulses:          Popliteal pulses are 3+ on the right side and 2+ on the left side.       Posterior tibial pulses are 2+ on the right side and 2+ on the left side.  Abdominal:     General: Abdomen is flat.     Palpations: Abdomen is soft. There is no mass.  Musculoskeletal:        General: Normal range of motion.     Cervical back: Normal range of motion and neck supple.     Right lower leg: No edema.     Left lower leg: No edema.  Skin:    General: Skin is warm.  Neurological:     General: No focal deficit present.     Mental Status: He is alert.  Psychiatric:        Mood and Affect: Mood normal.        Thought Content: Thought content normal.        Judgment: Judgment normal.     Data: BILATERAL CAROTID DUPLEX ULTRASOUND   TECHNIQUE: Elnor scale imaging, color Doppler and duplex ultrasound were performed of bilateral carotid and vertebral arteries in the neck.   COMPARISON:  None Available.   FINDINGS: Criteria: Quantification of carotid stenosis is based on velocity parameters that correlate the residual internal carotid diameter with NASCET-based stenosis levels, using the diameter of the distal internal carotid lumen as the denominator for stenosis measurement.   The following velocity measurements were obtained:   RIGHT   ICA: 164/36 cm/sec   CCA: 108/16 cm/sec   SYSTOLIC ICA/CCA RATIO:  1.2   ECA: 137 PSV   LEFT   ICA: 134/31 cm/sec   CCA: 197/30 cm/sec  SYSTOLIC ICA/CCA RATIO:  0.7   ECA: 206 PSV   RIGHT CAROTID ARTERY: The right common carotid artery demonstrates mixed smooth  plaque. The right carotid bulb demonstrates irregular calcified plaque. The right ICA demonstrates irregular calcified plaque.   RIGHT VERTEBRAL ARTERY:  Antegrade arterial flow   LEFT CAROTID ARTERY: The left common carotid artery demonstrates mixed smooth plaque. The left carotid bulb demonstrates irregular calcified plaque. The left internal carotid artery demonstrates calcified irregular plaque.   LEFT VERTEBRAL ARTERY:  Antegrade arterial flow   IMPRESSION: 50-69% stenosis bilateral internal carotid arteries right greater than left.     Assessment/Plan:    64 year old male with bilateral carotid artery stenosis with left-sided bruit secondary to hypertension, hyperlipidemia, diabetes and neck radiation.  He also has a very strong palpable right popliteal pulse.  Plan will be to follow him up in 6 months with repeat carotid duplex and we will also check popliteal duplexes at that time.  All questions were answered and we discussed the signs and symptoms of stroke and he demonstrates good understanding in the presence of his wife.    Penne Lonni Colorado MD [facility]      [1]  Allergies Allergen Reactions   Codeine Rash   "

## 2024-12-05 ENCOUNTER — Other Ambulatory Visit: Payer: Self-pay

## 2024-12-05 DIAGNOSIS — I6523 Occlusion and stenosis of bilateral carotid arteries: Secondary | ICD-10-CM

## 2024-12-05 DIAGNOSIS — I739 Peripheral vascular disease, unspecified: Secondary | ICD-10-CM

## 2025-01-14 ENCOUNTER — Inpatient Hospital Stay

## 2025-01-14 ENCOUNTER — Inpatient Hospital Stay: Admitting: Internal Medicine

## 2025-06-05 ENCOUNTER — Ambulatory Visit (HOSPITAL_COMMUNITY)

## 2025-06-05 ENCOUNTER — Ambulatory Visit
